# Patient Record
Sex: Female | Born: 1980
Health system: Southern US, Community
[De-identification: ages and names within clinical notes are randomized; demographics above are authoritative.]

## PROBLEM LIST (undated history)

## (undated) DIAGNOSIS — I4711 Inappropriate sinus tachycardia, so stated: Secondary | ICD-10-CM

## (undated) DIAGNOSIS — R519 Headache, unspecified: Secondary | ICD-10-CM

## (undated) DIAGNOSIS — M1990 Unspecified osteoarthritis, unspecified site: Secondary | ICD-10-CM

## (undated) DIAGNOSIS — E538 Deficiency of other specified B group vitamins: Secondary | ICD-10-CM

## (undated) DIAGNOSIS — F32A Depression, unspecified: Secondary | ICD-10-CM

## (undated) DIAGNOSIS — E079 Disorder of thyroid, unspecified: Secondary | ICD-10-CM

## (undated) DIAGNOSIS — M419 Scoliosis, unspecified: Secondary | ICD-10-CM

## (undated) DIAGNOSIS — K911 Postgastric surgery syndromes: Secondary | ICD-10-CM

## (undated) DIAGNOSIS — R51 Headache: Secondary | ICD-10-CM

## (undated) DIAGNOSIS — N939 Abnormal uterine and vaginal bleeding, unspecified: Secondary | ICD-10-CM

## (undated) DIAGNOSIS — M797 Fibromyalgia: Secondary | ICD-10-CM

## (undated) DIAGNOSIS — R8789 Other abnormal findings in specimens from female genital organs: Principal | ICD-10-CM

## (undated) DIAGNOSIS — R102 Pelvic and perineal pain: Secondary | ICD-10-CM

## (undated) DIAGNOSIS — G90A Postural orthostatic tachycardia syndrome (POTS): Secondary | ICD-10-CM

## (undated) DIAGNOSIS — F0781 Postconcussional syndrome: Secondary | ICD-10-CM

## (undated) DIAGNOSIS — F4310 Post-traumatic stress disorder, unspecified: Secondary | ICD-10-CM

## (undated) DIAGNOSIS — J930 Spontaneous tension pneumothorax: Secondary | ICD-10-CM

## (undated) DIAGNOSIS — N946 Dysmenorrhea, unspecified: Principal | ICD-10-CM

## (undated) DIAGNOSIS — F329 Major depressive disorder, single episode, unspecified: Secondary | ICD-10-CM

## (undated) DIAGNOSIS — W57XXXA Bitten or stung by nonvenomous insect and other nonvenomous arthropods, initial encounter: Secondary | ICD-10-CM

## (undated) DIAGNOSIS — I951 Orthostatic hypotension: Secondary | ICD-10-CM

## (undated) DIAGNOSIS — R000 Tachycardia, unspecified: Secondary | ICD-10-CM

## (undated) DIAGNOSIS — N926 Irregular menstruation, unspecified: Secondary | ICD-10-CM

## (undated) DIAGNOSIS — R5383 Other fatigue: Secondary | ICD-10-CM

## (undated) DIAGNOSIS — Z8669 Personal history of other diseases of the nervous system and sense organs: Secondary | ICD-10-CM

## (undated) DIAGNOSIS — F419 Anxiety disorder, unspecified: Secondary | ICD-10-CM

## (undated) DIAGNOSIS — I498 Other specified cardiac arrhythmias: Secondary | ICD-10-CM

## (undated) HISTORY — PX: ROTATOR CUFF REPAIR: SHX139

## (undated) HISTORY — DX: Post-traumatic stress disorder, unspecified: F43.10

## (undated) HISTORY — DX: Irregular menstruation, unspecified: N92.6

## (undated) HISTORY — DX: Scoliosis, unspecified: M41.9

## (undated) HISTORY — DX: Unspecified osteoarthritis, unspecified site: M19.90

## (undated) HISTORY — DX: Deficiency of other specified B group vitamins: E53.8

## (undated) HISTORY — DX: Postconcussional syndrome: F07.81

## (undated) HISTORY — DX: Orthostatic hypotension: I95.1

## (undated) HISTORY — PX: OTHER SURGICAL HISTORY: SHX169

## (undated) HISTORY — DX: Disorder of thyroid, unspecified: E07.9

## (undated) HISTORY — DX: Bitten or stung by nonvenomous insect and other nonvenomous arthropods, initial encounter: W57.XXXA

## (undated) HISTORY — DX: Postgastric surgery syndromes: K91.1

## (undated) HISTORY — PX: KNEE SURGERY: SHX244

## (undated) HISTORY — DX: Dysmenorrhea, unspecified: N94.6

## (undated) HISTORY — DX: Headache: R51

## (undated) HISTORY — DX: Inappropriate sinus tachycardia, so stated: I47.11

## (undated) HISTORY — DX: Postural orthostatic tachycardia syndrome (POTS): G90.A

## (undated) HISTORY — DX: Headache, unspecified: R51.9

## (undated) HISTORY — PX: COLONOSCOPY: SHX174

## (undated) HISTORY — DX: Fibromyalgia: M79.7

## (undated) HISTORY — DX: Pelvic and perineal pain: R10.2

## (undated) HISTORY — DX: Other specified cardiac arrhythmias: I49.8

## (undated) HISTORY — DX: Personal history of other diseases of the nervous system and sense organs: Z86.69

## (undated) HISTORY — DX: Abnormal uterine and vaginal bleeding, unspecified: N93.9

## (undated) HISTORY — DX: Other abnormal findings in specimens from female genital organs: R87.89

## (undated) HISTORY — DX: Other fatigue: R53.83

## (undated) HISTORY — DX: Tachycardia, unspecified: R00.0

---
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
## 2000-06-21 ENCOUNTER — Emergency Department (HOSPITAL_COMMUNITY): Admission: EM | Admit: 2000-06-21 | Discharge: 2000-06-21 | Payer: Self-pay | Admitting: Emergency Medicine

## 2001-05-09 ENCOUNTER — Other Ambulatory Visit: Admission: RE | Admit: 2001-05-09 | Discharge: 2001-05-09 | Payer: Self-pay | Admitting: Obstetrics and Gynecology

## 2001-05-17 ENCOUNTER — Emergency Department (HOSPITAL_COMMUNITY): Admission: EM | Admit: 2001-05-17 | Discharge: 2001-05-17 | Payer: Self-pay | Admitting: Emergency Medicine

## 2001-09-15 ENCOUNTER — Emergency Department (HOSPITAL_COMMUNITY): Admission: EM | Admit: 2001-09-15 | Discharge: 2001-09-16 | Payer: Self-pay | Admitting: Emergency Medicine

## 2001-09-16 ENCOUNTER — Encounter: Payer: Self-pay | Admitting: Emergency Medicine

## 2002-04-02 ENCOUNTER — Ambulatory Visit (HOSPITAL_COMMUNITY): Admission: RE | Admit: 2002-04-02 | Discharge: 2002-04-02 | Payer: Self-pay | Admitting: *Deleted

## 2002-04-02 ENCOUNTER — Encounter: Payer: Self-pay | Admitting: *Deleted

## 2002-07-03 ENCOUNTER — Ambulatory Visit (HOSPITAL_COMMUNITY): Admission: AD | Admit: 2002-07-03 | Discharge: 2002-07-03 | Payer: Self-pay | Admitting: *Deleted

## 2002-08-11 ENCOUNTER — Inpatient Hospital Stay (HOSPITAL_COMMUNITY): Admission: AD | Admit: 2002-08-11 | Discharge: 2002-08-13 | Payer: Self-pay | Admitting: *Deleted

## 2003-01-25 ENCOUNTER — Emergency Department (HOSPITAL_COMMUNITY): Admission: EM | Admit: 2003-01-25 | Discharge: 2003-01-26 | Payer: Self-pay | Admitting: *Deleted

## 2003-08-28 ENCOUNTER — Ambulatory Visit (HOSPITAL_COMMUNITY): Admission: RE | Admit: 2003-08-28 | Discharge: 2003-08-28 | Payer: Self-pay | Admitting: Family Medicine

## 2003-09-02 ENCOUNTER — Ambulatory Visit (HOSPITAL_COMMUNITY): Admission: RE | Admit: 2003-09-02 | Discharge: 2003-09-02 | Payer: Self-pay | Admitting: Internal Medicine

## 2003-11-30 ENCOUNTER — Ambulatory Visit (HOSPITAL_COMMUNITY): Admission: RE | Admit: 2003-11-30 | Discharge: 2003-11-30 | Payer: Self-pay | Admitting: *Deleted

## 2005-02-21 ENCOUNTER — Emergency Department (HOSPITAL_COMMUNITY): Admission: EM | Admit: 2005-02-21 | Discharge: 2005-02-22 | Payer: Self-pay | Admitting: Emergency Medicine

## 2005-03-29 ENCOUNTER — Ambulatory Visit (HOSPITAL_COMMUNITY): Admission: RE | Admit: 2005-03-29 | Discharge: 2005-03-29 | Payer: Self-pay | Admitting: General Surgery

## 2005-03-29 ENCOUNTER — Encounter (INDEPENDENT_AMBULATORY_CARE_PROVIDER_SITE_OTHER): Payer: Self-pay | Admitting: General Surgery

## 2005-11-19 ENCOUNTER — Observation Stay (HOSPITAL_COMMUNITY): Admission: AD | Admit: 2005-11-19 | Discharge: 2005-11-19 | Payer: Self-pay | Admitting: Obstetrics and Gynecology

## 2005-12-15 ENCOUNTER — Ambulatory Visit (HOSPITAL_COMMUNITY): Admission: AD | Admit: 2005-12-15 | Discharge: 2005-12-15 | Payer: Self-pay | Admitting: Obstetrics and Gynecology

## 2005-12-19 ENCOUNTER — Ambulatory Visit (HOSPITAL_COMMUNITY): Admission: AD | Admit: 2005-12-19 | Discharge: 2005-12-19 | Payer: Self-pay | Admitting: Obstetrics and Gynecology

## 2005-12-22 ENCOUNTER — Ambulatory Visit (HOSPITAL_COMMUNITY): Admission: AD | Admit: 2005-12-22 | Discharge: 2005-12-22 | Payer: Self-pay | Admitting: Obstetrics and Gynecology

## 2005-12-26 ENCOUNTER — Ambulatory Visit (HOSPITAL_COMMUNITY): Admission: AD | Admit: 2005-12-26 | Discharge: 2005-12-26 | Payer: Self-pay | Admitting: Obstetrics and Gynecology

## 2005-12-29 ENCOUNTER — Ambulatory Visit (HOSPITAL_COMMUNITY): Admission: AD | Admit: 2005-12-29 | Discharge: 2005-12-29 | Payer: Self-pay | Admitting: Internal Medicine

## 2006-01-03 ENCOUNTER — Encounter (INDEPENDENT_AMBULATORY_CARE_PROVIDER_SITE_OTHER): Payer: Self-pay | Admitting: *Deleted

## 2006-01-03 ENCOUNTER — Inpatient Hospital Stay (HOSPITAL_COMMUNITY): Admission: RE | Admit: 2006-01-03 | Discharge: 2006-01-06 | Payer: Self-pay | Admitting: Obstetrics & Gynecology

## 2006-03-07 ENCOUNTER — Ambulatory Visit (HOSPITAL_COMMUNITY): Payer: Self-pay | Admitting: Psychiatry

## 2006-03-14 ENCOUNTER — Ambulatory Visit (HOSPITAL_COMMUNITY): Payer: Self-pay | Admitting: Psychiatry

## 2006-03-29 ENCOUNTER — Ambulatory Visit (HOSPITAL_COMMUNITY): Payer: Self-pay | Admitting: Psychiatry

## 2006-03-30 ENCOUNTER — Ambulatory Visit (HOSPITAL_COMMUNITY): Payer: Self-pay | Admitting: Psychiatry

## 2006-04-10 ENCOUNTER — Ambulatory Visit (HOSPITAL_COMMUNITY): Payer: Self-pay | Admitting: Psychiatry

## 2006-04-17 ENCOUNTER — Ambulatory Visit (HOSPITAL_COMMUNITY): Payer: Self-pay | Admitting: Psychiatry

## 2006-04-19 ENCOUNTER — Ambulatory Visit (HOSPITAL_COMMUNITY): Payer: Self-pay | Admitting: Psychiatry

## 2006-04-24 ENCOUNTER — Ambulatory Visit (HOSPITAL_COMMUNITY): Payer: Self-pay | Admitting: Psychiatry

## 2006-04-26 ENCOUNTER — Ambulatory Visit (HOSPITAL_COMMUNITY): Payer: Self-pay | Admitting: Psychiatry

## 2006-05-04 ENCOUNTER — Ambulatory Visit (HOSPITAL_COMMUNITY): Payer: Self-pay | Admitting: Psychiatry

## 2006-05-10 ENCOUNTER — Ambulatory Visit (HOSPITAL_COMMUNITY): Payer: Self-pay | Admitting: Psychiatry

## 2006-05-29 ENCOUNTER — Ambulatory Visit (HOSPITAL_COMMUNITY): Payer: Self-pay | Admitting: Psychiatry

## 2006-07-10 ENCOUNTER — Ambulatory Visit (HOSPITAL_COMMUNITY): Payer: Self-pay | Admitting: Psychiatry

## 2006-07-11 ENCOUNTER — Ambulatory Visit (HOSPITAL_COMMUNITY): Payer: Self-pay | Admitting: Psychiatry

## 2006-07-19 ENCOUNTER — Ambulatory Visit (HOSPITAL_COMMUNITY): Payer: Self-pay | Admitting: Psychiatry

## 2006-07-24 ENCOUNTER — Ambulatory Visit (HOSPITAL_COMMUNITY): Payer: Self-pay | Admitting: Psychiatry

## 2006-08-20 ENCOUNTER — Ambulatory Visit (HOSPITAL_COMMUNITY): Payer: Self-pay | Admitting: Psychiatry

## 2006-08-21 ENCOUNTER — Ambulatory Visit (HOSPITAL_COMMUNITY): Payer: Self-pay | Admitting: Psychiatry

## 2006-09-17 ENCOUNTER — Ambulatory Visit (HOSPITAL_COMMUNITY): Payer: Self-pay | Admitting: Psychiatry

## 2006-10-11 ENCOUNTER — Ambulatory Visit (HOSPITAL_COMMUNITY): Payer: Self-pay | Admitting: Psychiatry

## 2006-12-11 ENCOUNTER — Ambulatory Visit (HOSPITAL_COMMUNITY): Payer: Self-pay | Admitting: Psychiatry

## 2008-03-02 ENCOUNTER — Ambulatory Visit (HOSPITAL_COMMUNITY): Admission: RE | Admit: 2008-03-02 | Discharge: 2008-03-02 | Payer: Self-pay | Admitting: Family Medicine

## 2008-04-10 ENCOUNTER — Other Ambulatory Visit: Admission: RE | Admit: 2008-04-10 | Discharge: 2008-04-10 | Payer: Self-pay | Admitting: Obstetrics & Gynecology

## 2009-03-03 ENCOUNTER — Ambulatory Visit (HOSPITAL_COMMUNITY): Admission: RE | Admit: 2009-03-03 | Discharge: 2009-03-03 | Payer: Self-pay | Admitting: Family Medicine

## 2009-05-26 ENCOUNTER — Other Ambulatory Visit: Admission: RE | Admit: 2009-05-26 | Discharge: 2009-05-26 | Payer: Self-pay | Admitting: Obstetrics and Gynecology

## 2009-06-22 ENCOUNTER — Ambulatory Visit (HOSPITAL_COMMUNITY): Admission: RE | Admit: 2009-06-22 | Discharge: 2009-06-22 | Payer: Self-pay | Admitting: Family Medicine

## 2009-11-26 ENCOUNTER — Ambulatory Visit (HOSPITAL_COMMUNITY): Admission: RE | Admit: 2009-11-26 | Discharge: 2009-11-26 | Payer: Self-pay | Admitting: Family Medicine

## 2009-12-22 ENCOUNTER — Ambulatory Visit (HOSPITAL_COMMUNITY): Admission: RE | Admit: 2009-12-22 | Discharge: 2009-12-22 | Payer: Self-pay | Admitting: Family Medicine

## 2010-04-07 ENCOUNTER — Other Ambulatory Visit: Admission: RE | Admit: 2010-04-07 | Discharge: 2010-04-07 | Payer: Self-pay | Admitting: Obstetrics and Gynecology

## 2010-04-11 ENCOUNTER — Ambulatory Visit (HOSPITAL_COMMUNITY): Admission: RE | Admit: 2010-04-11 | Discharge: 2010-04-11 | Payer: Self-pay | Admitting: Internal Medicine

## 2010-04-13 ENCOUNTER — Ambulatory Visit (HOSPITAL_COMMUNITY): Admission: RE | Admit: 2010-04-13 | Discharge: 2010-04-13 | Payer: Self-pay | Admitting: Obstetrics & Gynecology

## 2010-05-17 ENCOUNTER — Emergency Department (HOSPITAL_COMMUNITY): Admission: EM | Admit: 2010-05-17 | Discharge: 2010-05-17 | Payer: Self-pay | Admitting: Emergency Medicine

## 2010-05-17 ENCOUNTER — Emergency Department (HOSPITAL_COMMUNITY)
Admission: EM | Admit: 2010-05-17 | Discharge: 2010-05-17 | Payer: Self-pay | Source: Home / Self Care | Admitting: Emergency Medicine

## 2010-08-01 ENCOUNTER — Inpatient Hospital Stay (HOSPITAL_COMMUNITY): Admission: EM | Admit: 2010-08-01 | Discharge: 2010-08-05 | Payer: Self-pay | Admitting: Emergency Medicine

## 2010-12-14 LAB — BASIC METABOLIC PANEL
Chloride: 99 mEq/L (ref 96–112)
GFR calc Af Amer: >60 mL/min (ref >60)
Potassium: 4.2 mEq/L (ref 3.5–5.1)
Sodium: 135 mEq/L (ref 135–145)

## 2010-12-14 LAB — URINALYSIS, ROUTINE W REFLEX MICROSCOPIC
Bilirubin Urine: NEGATIVE
Glucose, UA: NEGATIVE mg/dL
Ketones, ur: NEGATIVE mg/dL
Leukocytes, UA: NEGATIVE
Protein, ur: NEGATIVE mg/dL

## 2010-12-14 LAB — CBC
HCT: 40 % (ref 36.0–46.0)
Hemoglobin: 13.9 g/dL (ref 12.0–15.0)
MCV: 94.1 fL (ref 78.0–100.0)
RBC: 4.25 MIL/uL (ref 3.87–5.11)
WBC: 10.5 10*3/uL (ref 4.0–10.5)

## 2010-12-14 LAB — HEPATIC FUNCTION PANEL
AST: 23 U/L (ref 0–37)
Albumin: 4.4 g/dL (ref 3.5–5.2)
Total Protein: 7 g/dL (ref 6.0–8.3)

## 2010-12-14 LAB — DIFFERENTIAL
Eosinophils Relative: 1 % (ref 0–5)
Lymphocytes Relative: 29 % (ref 12–46)
Lymphs Abs: 3 10*3/uL (ref 0.7–4.0)
Monocytes Absolute: 0.7 10*3/uL (ref 0.1–1.0)
Neutro Abs: 6.6 10*3/uL (ref 1.7–7.7)

## 2010-12-14 LAB — POCT CARDIAC MARKERS
CKMB, poc: <1 ng/mL — ABNORMAL LOW (ref 1.0–8.0)
Myoglobin, poc: 43.5 ng/mL (ref 12–200)
Troponin i, poc: <0.05 ng/mL (ref 0.00–0.09)

## 2010-12-14 LAB — POCT PREGNANCY, URINE: Preg Test, Ur: NEGATIVE

## 2010-12-14 LAB — URINE MICROSCOPIC-ADD ON

## 2011-02-17 NOTE — H&P (Signed)
NAMEKEERTHI, HAZELL                             ACCOUNT NO.:  [id number]   MEDICAL RECORD NO.:  [id number]                   PATIENT TYPE:   LOCATION:                                       FACILITY:  [facility]   PHYSICIAN:  Langley Gauss, M.D.                DATE OF BIRTH:  Jan 20, 1981   DATE OF ADMISSION:  08/11/2002  DATE OF DISCHARGE:                                HISTORY & PHYSICAL   HISTORY OF PRESENT ILLNESS:  A 30 year old gravida 2 para 0 at 38.[redacted] weeks  gestation is admitted for induction of labor.  The patient was noted to have  onset of gestational hypertension with blood pressure during the previous  several weeks as follows:  140/88, 148/88, 140/86, 170/100 on July 03, 2002.  The patient has been home monitoring home blood pressures.  She did  have a home blood pressure over the weekend of 156/97.  She denies any prior  history of hypertension.  She has never been treated with any hypertensive  medications.  She is noted to not have any proteinuria on examination.  She  denies any headaches or right upper quadrant pain.   ALLERGIES:  No known drug allergies.   PAST MEDICAL HISTORY:  The patient is noted to have a history of recurrent  oral HSV, was treated during the pregnancy at one point in time with Valtrex  2 g p.o. x1.  She denies any history of genital herpes.  She has no other  medical/surgical history.   CURRENT MEDICATIONS:  Prenatal vitamins.   SOCIAL HISTORY:  The patient is noted to smoke one pack per day, employed at  [employer].  Father of the baby is named Joey who is employed at [employer]  [employer]ation.  Maternal birth weight noted to be 8.5 pounds.  Paternal birth  weight 6.5 pounds.   PHYSICAL EXAMINATION:  GENERAL:  Healthy white female.  No significant edema  noted.  VITAL SIGNS:  Blood pressure 140/88.  Height 5 feet 5 inches.  Prepregnancy  weight 117, today's weight 150.  Pulse rate 80, respiratory rate 20.  HEENT:  Negative, no adenopathy.  NECK:   Supple.  Thyroid is nonpalpable.  LUNGS:  Clear.  CARDIOVASCULAR:  Regular rate and rhythm.  ABDOMEN:  Soft and nontender.  No surgical scars are identified.  She has  vertex presentation by Leopold's maneuvers.  EXTREMITIES:  Reveal only a trace pretibial edema.  Reflexes are 2+,  symmetric; no clonus identified.  FETAL HEART TONES:  Ausculted in the 150s.  PELVIC:  Normal external genitalia, no lesions or ulcerations, no vaginal  bleeding, no leakage of fluid.  Pelvis is noted to be clinically adequate on  examination.  Cervix is noted to be 2 cm dilated, 70% effaced, vertex at a 0  station and well applied to the cervix.    ASSESSMENT AND PLAN:  Findings as  above of gestational hypertension at term,  favorable cervix at this time.  The patient is referred to [facility] for induction.  Will proceed with amniotomy; thereafter induce or  augment with Pitocin if clinically indicated.  Pediatrician to be the  pediatrician on call.  The patient does plan on bottle feeding.  She will be  utilizing birth control pills for postpartum birth control purposes.                                               Langley Gauss, M.D.    DC/MEDQ  D:  08/11/2002  T:  08/11/2002  Job:  [id number]

## 2011-02-17 NOTE — Op Note (Signed)
[name], Destiny Orr                             ACCOUNT NO.:  [id number]   MEDICAL RECORD NO.:  [id number]                   PATIENT TYPE:  INP   LOCATION:  A419                                 FACILITY:  [facility]   PHYSICIAN:  Langley Gauss, M.D.                DATE OF BIRTH:  [birth year]   DATE OF PROCEDURE:  08/12/2002  DATE OF DISCHARGE:  08/13/2002                                 OPERATIVE REPORT   DIAGNOSES:  1. A 38+ week intrauterine pregnancy.  2. Gestational hypertension.   PROCEDURE:  Outlet vacuum extraction utilizing a Kiwi vacuum extractor.  Delivery performed by Langley Gauss, M.D.   FINDINGS:  Delivery of a 6 pound 9.3 ounce female infant delivered over a  midline episiotomy.  Findings at time of delivery include a nuchal cord x2  with compression.   ESTIMATED BLOOD LOSS:  Less than 500 cc.   COMPLICATIONS:  None.   SPECIMENS:  Arterial cord gas and cord blood to pathology.  The placenta was  examined and noted to be apparently intact with a three-vessel umbilical  cord.   ANALGESIA:  Epidural, supplemented with 30 cc of 1% lidocaine injected in  the midline in the perineal body.   DESCRIPTION OF PROCEDURE:  On 08/11/02, the patient admitted for induction  of labor secondary to findings of gestational hypertension.  Amniotomy  performed with findings of clear amniotic fluid.  The patient had an  epidural placed with onset of discomfort of uterine contractions.  The  patient thereafter progressed normally along the labor curve to complete  dilatation.  She had some moderate variable decelerations during the second  stage of labor.  She was placed in the dorsal lithotomy position and  encouraged to push.  The Foley catheter was removed.  The patient pushed  well with descent of the vertex to the perineal floor, at which time Kiwi  vacuum extractor was placed on the infant's vertex.  At all times pressure  was kept within the safe green range.  Over the next  consecutive two  contractions, the infant delivered in a direct OA position over this midline  episiotomy without extension, and mouth and nares were bulb-suctioned of  clear amniotic fluid.  Renewed expulsive efforts resulted in spontaneous  rotation to a left anterior shoulder position.  Gentle downward traction  combined with expulsive efforts resulted in delivery of the anterior  shoulder onto the pubic symphysis without difficulty as well as the  remainder of the infant.  The cord was doubly clamped and cut, and the  infant was placed on the maternal abdomen for immediate bonding purposes.  Arterial cord gas and cord blood were then obtained.  Gentle traction on the  umbilical cord resulted in separation, which upon examination was an intact  three-vessel placenta.  Examination of the genital tract reveals a midline  episiotomy without extension.  This is  easily repaired utilizing 0 chromic  in a running locked fashion on the  vaginal mucosa, followed by two-layer closure of 0 chromic on the perineal  body.  The patient tolerated the delivery very well.  She was taken out of  dorsal lithotomy position and rolled to her side, at which time the epidural  catheter is removed with the blue tip noted to be intact.                                                Langley Gauss, M.D.    DC/MEDQ  D:  08/18/2002  T:  08/18/2002  Job:  [id number]

## 2011-02-17 NOTE — Discharge Summary (Signed)
   NAMELEXYS, MILLINER                             ACCOUNT NO.:  [id number]   MEDICAL RECORD NO.:  [id number]                   PATIENT TYPE:  INP   LOCATION:  A419                                 FACILITY:  [facility]   PHYSICIAN:  Langley Gauss, M.D.                DATE OF BIRTH:  [birth year]   DATE OF ADMISSION:  08/11/2002  DATE OF DISCHARGE:  08/13/2002                                 DISCHARGE SUMMARY   PROCEDURE:  Delivered a 6-pound 9.3-ounce female infant.   FOLLOW-UP:  The patient will follow up in the office in four weeks' time.   DISCHARGE INSTRUCTIONS:  She is bottle-feeding.  She is utilizing  pediatrician on call.  She would like to utilize oral contraceptives for  postpartum birth control.   LABORATORY DATA:  Blood type Rh negative.  RhoGAM evaluation was done during  this hospitalization.  Admission hemoglobin and hematocrit 12.9/36.2, with a  white count of 15.4.  On postpartum day #1, 11.1/32.8 with a white count of  15.3.   HOSPITAL COURSE:  See previous dictations.  Postpartum the patient did well.  She bonded well with the infant.  The midline episiotomy appeared to be  healing very well.  She had adequate pain relief, such that she was  discharged home on postpartum day #1.   DISCHARGE MEDICATIONS:  Tylox #30.  In four weeks, the patient will  reinitiate oral contraceptives.  She has previously done well taking  jasmine.                                               Langley Gauss, M.D.    DC/MEDQ  D:  08/18/2002  T:  08/18/2002  Job:  [id number]

## 2011-02-17 NOTE — Op Note (Signed)
Destiny Orr, Destiny Orr                   ACCOUNT NO.:  [id number]   MEDICAL RECORD NO.:  [id number]          PATIENT TYPE:  INP   LOCATION:  A403                          FACILITY:  [facility]   PHYSICIAN:  Lazaro Arms, M.D.   DATE OF BIRTH:  [birth year]   DATE OF PROCEDURE:  01/03/2006  DATE OF DISCHARGE:                                 OPERATIVE REPORT   PREOPERATIVE DIAGNOSES:  1.  Intrauterine pregnancy at [redacted] weeks gestation.  2.  Gestational hypertension.  3.  Twins.   POSTOPERATIVE DIAGNOSES:  1.  Intrauterine pregnancy at [redacted] weeks gestation.  2.  Gestational hypertension.  3.  Twins.   PROCEDURE:  Primary low transverse cesarean section.   SURGEON:  Lazaro Arms, M.D.   ANESTHESIA:  Spinal.   FINDINGS:  Over a low transverse hysterotomy incision, was delivered a  viable twin, twin A.  Apgars 9 and 9, weight to be determined in nursery.  Cord blood and cord gas sent.  Twin B was then delivered or again vertex  presentation.  Cord blood and cord gas sent.  The infant had Apgars of 8 and  9 and was taken to the nursery to be weighed and evaluated.   DESCRIPTION OF PROCEDURE:  Patient was taken to the OR, placed in the  sitting position, underwent spinal anesthetic.  Was then placed in the  supine position.  Foley catheter was placed.  She was prepped and draped in  the usual sterile fashion.  Pfannenstiel skin incision was carried down  sharply through the rectus fascia, scored in the midline, extended  laterally.  The fascia was taken off the muscle superiorly and inferior, the  muscles were divided.  No cavity was entered.  Bladder blade was placed.  A  vesicouterine serosal flap was created.  A lot transverse hysterotomy  incision was made.  Twin A was delivered, handed to Dr. Milinda Cave who was in  attendance for routine neonatal resuscitation, then twin B.  They were both  in a vertex presentation.  Apgars were 9 and 9 on A and 8 and 9 on B.  Weight to be determined in the  nursery.  Three vessel cord.  Cord blood and  cord gases were sent on both twins.  The placentas were delivered and sent  for routine pathology.  They were fused.  The uterus was wiped clean,  exteriorized, closed in two layers, the first being running interlocking  layer, the second being imbricating layer.  The uterus was placed in  peritoneal cavity.  The uterine pedicle was hemostatic.  The pelvis was  irrigated vigorously.  The muscles and peritoneum reapproximated loosely.  The fascia closed using 0 Vicryl running subcutaneous  tissues, made hemostatic and irrigated. The skin was closed using skin  staples. The patient tolerated the procedure well.  She experienced 500 mL  of blood loss and was taken to the recovery room in good and stable  condition.  All counts correct x3.  She received Ancef prophylactically  after the cord was clamped.      Destiny Orr  [name], M.D.  Electronically Signed     LHE/MEDQ  D:  01/03/2006  T:  01/04/2006  Job:  [id number]

## 2011-02-17 NOTE — Op Note (Signed)
NAMENaidelin, Destiny Orr                   ACCOUNT NO.:  [id number]   MEDICAL RECORD NO.:  [id number]          PATIENT TYPE:  AMB   LOCATION:  DAY                           FACILITY:  [facility]   PHYSICIAN:  Dalia Heading, M.D.  DATE OF BIRTH:  1980/10/07   DATE OF PROCEDURE:  03/29/2005  DATE OF DISCHARGE:                                 OPERATIVE REPORT   PREOPERATIVE DIAGNOSIS:  Thrombosed hemorrhoid.   POSTOPERATIVE DIAGNOSIS:  Thrombosed hemorrhoid.   PROCEDURE:  Internal and external hemorrhoidectomy.   SURGEON:  Dr. Franky Macho.   ANESTHESIA:  General.   INDICATIONS:  The patient is a 30 year old white female who presents with a  thrombosed hemorrhoid. Risks and benefits of the procedure including  bleeding, infection, recurrence of the hemorrhoidal disease were fully  explained to the patient, who gave informed consent.   PROCEDURE NOTE:  The patient was placed in lithotomy position after general  anesthesia was administered. The perineum was prepped and draped in the  usual sterile technique with Betadine. Surgical site confirmation was  performed.   The patient had a large internal and external thrombosed hemorrhoid at the 7  o'clock position. No other significant hemorrhoidal disease was noted. A 2-0  Vicryl suture was placed at the dentate line. The hemorrhoid was excised  without difficulty. The mucosal edges were reapproximated using the running  2-0 Vicryl suture. 0.5% Sensorcaine was instilled in the surrounding  perineum. Surgicel and viscous Xylocaine was then placed in the rectum as a  packing.   All tape and needle counts correct at the end of the procedure. The patient  was awakened and transferred to PACU in stable condition.   COMPLICATIONS:  None.   SPECIMEN:  Hemorrhoid.   BLOOD LOSS:  Minimal.       MAJ/MEDQ  D:  03/29/2005  T:  03/29/2005  Job:  [id number]   cc:   Patrica Duel, M.D.  [address], [address]  [place]  [place] [address]  Fax:  [phone]

## 2011-02-17 NOTE — Group Therapy Note (Signed)
NAMETRUDI, MORGENTHALER                   ACCOUNT NO.:  [id number]   MEDICAL RECORD NO.:  [id number]          PATIENT TYPE:  OBV   LOCATION:  LDR3                          FACILITY:  [facility]   PHYSICIAN:  Tilda Burrow, M.D. DATE OF BIRTH:  27-Oct-1980   DATE OF PROCEDURE:  11/19/2005  DATE OF DISCHARGE:                                   PROGRESS NOTE   Observation to labor and delivery x1 hour. Reactive NST x2 infants.   CHIEF COMPLAINT:  Twin gestation with patient concerns of elevated blood  pressure.   HISTORY OF PRESENT ILLNESS:  This 30 year old female with known twin  gestation, gravida 3, para 1, AB 1, one living child at [redacted] weeks gestation  presents with concerns of blood pressure elevation. She had some pelvic  pressure and discomfort and was wondering if her blood pressure might be  elevated. She has no headaches, scotoma, right upper quadrant pain.  Pregnancy course to date has been notable for twin gestation with reportedly  symmetric growth. Last ultrasound of record was October 11, 2005 which was  reported as normal. She has been on [place] since September.   PHYSICAL EXAMINATION:  VITAL SIGNS:  Shows blood pressure initially 138/92  upon arrival; after resting, it was 138/82 after 15 minutes rest.  GENERAL:  She is without edema. Reactive NST is obtained on both infants.  Reflexes are 2+ without clonus. As started earlier, there is no right upper  quadrant pain on exam or patient's symptoms.   IMPRESSION:  No evidence of hypertension or evidence of preeclampsia.  Continue current regimen. The patient does not know dose. Will confirm and  document in office visit this week (the patient did not bring medications  with her). Follow up three days [facility] for recheck.      Tilda Burrow, M.D.  Electronically Signed     JVF/MEDQ  D:  11/19/2005  T:  11/20/2005  Job:  [id number]

## 2011-02-17 NOTE — Op Note (Signed)
   Destiny Orr, Destiny Orr                             ACCOUNT NO.:  [id number]   MEDICAL RECORD NO.:  [id number]                   PATIENT TYPE:  INP   LOCATION:  A419                                 FACILITY:  [facility]   PHYSICIAN:  Langley Gauss, M.D.                DATE OF BIRTH:  07/09/1981   DATE OF PROCEDURE:  08/11/2002  DATE OF DISCHARGE:  08/13/2002                                 OPERATIVE REPORT   PROCEDURE:  Placement of continuous lumbar epidural analgesia at the L3-4  interspace, performed by Langley Gauss, M.D.   COMPLICATIONS:  none.   DESCRIPTION OF PROCEDURE:  Appropriate informed consent was obtained.  The  patient was placed in a seated position, at which time bony landmarks were  identified.  The L3-4 interspace was chosen.  The patient's back is  sterilely prepped and draped in the usual sterile manner.  Five cubic  centimeters of 1% lidocaine plain injected at the midline at the L3-4  interspace, raising a small skin wheal.  A 17-gauge Tuohy-Schliff needle is  then utilized with loss of resistance to an air-filled glass syringe to  identify entry into the epidural space on the first attempt without  difficulty.  Initial test dose of 5 cc of 1.5% lidocaine plus epinephrine  injected through the epidural needle.  No signs of CSF or intravascular  injection obtained.  Thus, the epidural needle is removed after insertion of  the epidural catheter to a depth of 5 cm.  Aspiration test is negative.  A  second test dose of 2 cc of 1.5% lidocaine plus epinephrine injected through  the epidural catheter.  Again no signs of CSF or intravascular obtained, and  thus the patient is connected to an infusion pump containing the standard  mixture.  She will be treated with a bolus of 10 cc, followed by a  continuous infusion rate of 14 cc/heart rate.                                               Langley Gauss, M.D.    DC/MEDQ  D:  08/18/2002  T:  08/18/2002  Job:  [id number]

## 2011-02-17 NOTE — Op Note (Signed)
Destiny Orr, Destiny Orr                               ACCOUNT NO.:  [id number]   MEDICAL RECORD NO.:  [id number]                  PATIENT TYPE:   LOCATION:                                       FACILITY:   PHYSICIAN:  Langley Gauss, M.D.                DATE OF BIRTH:   DATE OF PROCEDURE:  DATE OF DISCHARGE:                                 OPERATIVE REPORT   OBSTETRICAL PROCEDURE NOTE:   PROCEDURE:  Placement of continuous lumbar epidural analgesia at the L3-L4  interspace performed by Dr. Roylene Reason. Lisette Grinder.   COMPLICATIONS:  None.   SPECIMENS:  None.   SUMMARY:  An appropriate and informed consent was obtained.  The patient  requested epidural analgesic with the onset of active labor.  She was placed  in the seated position at which time bony landmarks were identified.  The  patient is noted to have moderate scoliosis, but bony landmarks are easily  identified.  The L3-L4 interspace is selected.  The patient's back is  sterilely prepped and draped in the usual manner utilizing the epidural kit;  5 cc of 1% lidocaine plain is injected at the midline of the L3-L4  interspace to raise a small skin wheal.   The 17-gauge Touhy-Schliff needle was then utilized with loss of resistance  in air-filled glass syringe to identify entry into the epidural space on the  first attempt without difficulty.  Excellent loss of resistance was noted.  Five cc of 1.5% lidocaine plus epinephrine injected through the epidural  needle; and no signs of CSF or intravascular injection obtained.  We then  attempted to feed the epidural catheter; however, I was unable to feed the  catheter on the first attempt.  Thus, the glass syringe was reconnected.  Loss of resistance was again confirmed consistent with epidural placement.  The epidural needle was rotated 180 degrees.  On this attempt I was able to  easily pass the epidural catheter which was inserted to a depth of 5 cm.  The epidural needle was removed.   Aspiration test was negative.  Second test  dose of 2 cc of 1.5% lidocaine plus epinephrine injected through the  epidural catheter.  Again, no signs of CSF or intravascular injection  obtained.   Thus, the patient is connected to the infusion pump containing the standard  mixture.  Catheter is secured into place.  The patient is having tingling in  the feet consistent with a proper setting up epidural block.  She thus is  connected to the infusion pump with a continuous standard mixture.  She will  be treated with a bolus of 10 cc followed by an infusion rate of 14 cc/hour.  The patient does have evidence of an excellent setting up bilateral block.  After placement of the epidural she is examined.  Cervix is now noted to be  6 cm, completely effaced  with the vertex at a 0 station and a continued  reassuring fetal heart rate.  The patient is noted to still have slightly  elevated blood pressures with systolic blood pressures in the range of 140-  150.  Expectation is epidural with resultant normalization of blood  pressure.                                              Langley Gauss, M.D.   DC/MEDQ  D:  08/12/2002  T:  08/12/2002  Job:  [id number]

## 2011-02-17 NOTE — Op Note (Signed)
Destiny Orr, Destiny Orr                               ACCOUNT NO.:  [id number]   MEDICAL RECORD NO.:  [id number]                  PATIENT TYPE:   LOCATION:                                       FACILITY:   PHYSICIAN:  Langley Gauss, M.D.                DATE OF BIRTH:   DATE OF PROCEDURE:  08/12/2002  DATE OF DISCHARGE:                                 OPERATIVE REPORT   DELIVERY NOTE:   DIAGNOSES:  1. A 38+ week intrauterine pregnancy for induction of labor.  2. Gestational hypertension.   DELIVERY PERFORMED:  1. Outlet vacuum extraction 6 pound 9 ounce female infant.  2. Midline episiotomy repair.  Delivery performed by Dr. Roylene Reason. Lisette Grinder.   ANALGESIA:  Continuous lumbar epidural supplemented with 30 cc of 1%  lidocaine in the midline of the perineal body.   FINDINGS:  At the time of delivery is noted a nuchal cord x2 with  compression and moderate variable decelerations during the second stage of  labor.   ESTIMATED BLOOD LOSS:  Less than 500 cc.   SPECIMENS:  1. Arterial cord gas and cord blood to pathology.  2. The placenta is examined and noted to be apparently intake with a 3-     vessel umbilical cord.   SUMMARY:  The patient at 38+ weeks gestation seen in the office.  Again,  running slightly elevated blood pressure today 148/80.  Thus, she was  referred to [facility] for induction of labor.  Initial examination  cervix 2 cm dilated.  Amniotomy is performed with findings of clear amniotic  fluid.  Fetal scalp electrode is placed which documents a reassuring fetal  heart rate,   Pitocin induction thereafter initiated resulting in a functional labor  pattern.  With onset of discomfort with uterine contractions , the patient  initially requested IV pain medication.  She was treated with 10 mg of IV  Nubain.  However, this failed to achieve satisfactory results.  Thus the  patient requested epidural analgesia.  Epidural was placed without  difficulty.  It  functioned very well throughout the remainder of the labor  course.  Immediately after placement of the epidural the patient was noted  to be 6 cm dilated, completely effaced, and 0 station.  The patient had a  Foley catheter placed with findings of clear yellow urine.   Thereafter she progressed normally along the labor curve.  The patient  began, thereafter, having strong pelvic pressure. She was examined by the  nursing staff and noted to be completely dilated with the vertex at a +2  station.  The patient did not push until we were prepared for delivery.  The  patient was placed in the dorsal lithotomy position and prepped and draped  in the usual sterile manner.  Foley catheter was removed.   The patient pushed well during this second stage of  labor.  She did have  moderate variable decelerations which we found out later was due to the  nuchal cord.  There was descent of the vertex to the perineal floor;  however, the patient was not able to effect delivery beyond that; thus an  outlet Kiwi vacuum extractor was utilized.  Thirty cc of 1% lidocaine was  injected in the midline of the perineum.  A small midline episiotomy was  performed with the Kiwi applied and kept within the safe green range.  Gentle traction was applied which resulted in a very easy descent and  delivery of the vertex in a direct OA position over this midline episiotomy  without extension.   Mouth and nares of the infant were bulb suctioned of clear amniotic fluid.  The nuchal cord x2 was reduced.  Renewed expulsive efforts resulted in a  spontaneous rotation to a left anterior shoulder position.  Expulsive  efforts plus gentle downward traction resulted in delivery of this shoulder  as well as the remainder of the infant without difficulty.  The umbilical  cord was then milked towards the infant.  The cord was doubly clamped, and  cut.  A spontaneous and vigorous breathing cry is noted.  The infant is then   placed on the maternal abdomen for bonding purposes.  Arterial cord gases  and cord blood are obtained.   Gentle traction on the umbilical cord results in separation which, upon  examination, appears to be an intact 3-vessel cord with associated placenta.  Examination of the genital tract reveals no lacerations.  The midline  episiotomy has not extended.  This is easily repaired utilizing #0 chromic  in a running lock fashion on the vaginal mucosa followed by a layer of 2-  layer Vicryl in the deep tissues of the perineal body followed by a layer of  #0 chromic on the perineal skin.  This resulted in complete closure of the  episiotomy.  No other lacerations were noted to occur.  Uterine fundus noted  to be firm with manual massage.  Mother and infant doing very well following  delivery.  The mother is taken out of the dorsal lithotomy position and  rolled to her side at which time the epidural catheter was removed with the  blue tip noted to be intact.  The patient does plans on bottle feeding.  She  will be utilizing the pediatrician on call.  Infant weight:  6 pounds 9  ounce female infant.                                               Langley Gauss, M.D.    DC/MEDQ  D:  08/12/2002  T:  08/12/2002  Job:  [id number]

## 2011-02-17 NOTE — H&P (Signed)
Destiny Orr, Destiny Orr                   ACCOUNT NO.:  [id number]   MEDICAL RECORD NO.:  [id number]         PATIENT TYPE:  PAMB   LOCATION:                                FACILITY:  [facility]   PHYSICIAN:  Dalia Heading, M.D.  DATE OF BIRTH:  [birth year]   DATE OF ADMISSION:  03/29/2005  DATE OF DISCHARGE:  LH                                HISTORY & PHYSICAL   CHIEF COMPLAINT:  Thrombosed hemorrhoid.   HISTORY OF PRESENT ILLNESS:  The patient is a 30 year old white female who  is referred for evaluation and treatment of a thrombosed hemorrhoid. She has  had intermittent hemorrhoidal pain and swelling over many months. Her  current condition recurred three days ago.   Past medical history includes anxiety/depression.   PAST SURGICAL HISTORY:  Knee surgery.   CURRENT MEDICATIONS:  Birth control pills, Xanax, Lexapro.   ALLERGIES:  No known drug allergies.   REVIEW OF SYSTEMS:  The patient smokes a pack of cigarettes a day. She  denies any significant alcohol use. She denies any cardiopulmonary  difficulties or bleeding disorders.   PHYSICAL EXAMINATION:  GENERAL:  The patient is a well-developed, well-  nourished, white female in no acute distress. She is afebrile and vital  signs are stable.  LUNGS:  Clear to auscultation with equal breath sounds bilaterally.  HEART:  Reveals a regular rate and rhythm without S3, S4 or murmurs.  ABDOMEN:  Soft, nontender, nondistended. No hepatosplenomegaly or masses are  noted.  RECTAL:  Reveals a thrombosed hemorrhoid noted along the right lateral  aspect of the anus.   IMPRESSION:  Thrombosed hemorrhoid.   PLAN:  The patient is scheduled for hemorrhoidectomy on March 29, 2005. The  risks and benefits of the procedure including bleeding, infection, and  recurrence of the hemorrhoidal disease were fully explained to the patient  who gave informed consent.       MAJ/MEDQ  D:  03/28/2005  T:  03/28/2005  Job:  [id number]   cc:   Patrica Duel,  M.D.  [address], [address]  [place]  [place] [address]  Fax: [phone]   [facility]  Fax: [phone]

## 2011-02-17 NOTE — Discharge Summary (Signed)
Destiny Orr, Destiny Orr                   ACCOUNT NO.:  [id number]   MEDICAL RECORD NO.:  [id number]          PATIENT TYPE:  INP   LOCATION:  A403                          FACILITY:  [facility]   PHYSICIAN:  Tilda Burrow, M.D. DATE OF BIRTH:  [birth year]   DATE OF ADMISSION:  01/03/2006  DATE OF DISCHARGE:  04/07/2007LH                                 DISCHARGE SUMMARY   ADMISSION DIAGNOSIS:  Pregnancy, twins, [redacted] weeks gestation, declining trial  of labor.   DISCHARGE DIAGNOSIS:  Pregnancy at 37 weeks, twins delivered.   PROCEDURE:  Primary low-transverse cervical cesarean section, Dr. Duane Lope.   DISCHARGE MEDICATIONS:  1.  Tylox one p.o. q.4h. p.r.n. pain, dispense #30.  2.  Prenatal vitamins one p.o. daily.   FOLLOWUP:  In one week with Dr. Despina Hidden as scheduled.   HOSPITAL SUMMARY:  This 30 year old primiparous female at [redacted] weeks gestation  of pregnancy noted with twin gestation, notable for daily weight gain and  fundal height growth with ultrasound performed up to 34 weeks showing  symmetric growth.  Prenatal course was notable for multiple prenatal visits,  at least 24 visits documented, with blood type O negative, urine drug screen  negative, Rubella immune. Hemoglobin 13, hematocrit 14. Hepatitis, HIV, RPR,  GC, and chlamydia are all negative. Pap smear class I. The patient underwent  primary cesarean section by Dr. Despina Hidden on January 03, 2006.  The patient had been  notable for the past two weeks with blood pressures increasing from the 110  to 130 range systolic to 140 to 150 over 90 diastolic with negative protein,  reflux, etc. Cesarean was performed in an uncomplicated fashion delivering  healthy infants, Apgars of 9 and 9, weight of each infant was just below 5  pounds. Postoperative course was straightforward. The patient had an  admitting hemoglobin of 11.8, hematocrit 32.8, and had a postoperative  hemoglobin of 11.5, hematocrit 33.2. She was afebrile the entire postpartum  course and was discharged on postpartum day #3, tolerating a regular diet  with staples removed already due to spinning of the staples in the skin and  Steri-Strips placed. Follow up as scheduled with Dr. Despina Hidden.      Tilda Burrow, M.D.  Electronically Signed    JVF/MEDQ  D:  01/06/2006  T:  01/06/2006  Job:  [id number]   cc:   [facility]   [facility]

## 2011-05-11 ENCOUNTER — Other Ambulatory Visit: Payer: Self-pay | Admitting: Obstetrics & Gynecology

## 2011-05-11 ENCOUNTER — Other Ambulatory Visit (HOSPITAL_COMMUNITY)
Admission: RE | Admit: 2011-05-11 | Discharge: 2011-05-11 | Disposition: A | Discharge status: Home / Self Care | Payer: Self-pay | Source: Ambulatory Visit | Attending: Obstetrics & Gynecology | Admitting: Obstetrics & Gynecology

## 2011-05-11 DIAGNOSIS — Z01419 Encounter for gynecological examination (general) (routine) without abnormal findings: Secondary | ICD-10-CM | POA: Insufficient documentation

## 2011-08-13 ENCOUNTER — Emergency Department (HOSPITAL_COMMUNITY): Payer: Self-pay

## 2011-08-13 ENCOUNTER — Emergency Department (HOSPITAL_COMMUNITY)
Admission: EM | Admit: 2011-08-13 | Discharge: 2011-08-13 | Disposition: A | Discharge status: Home / Self Care | Payer: Self-pay | Attending: Emergency Medicine | Admitting: Emergency Medicine

## 2011-08-13 DIAGNOSIS — R079 Chest pain, unspecified: Secondary | ICD-10-CM | POA: Insufficient documentation

## 2011-08-13 DIAGNOSIS — S1093XA Contusion of unspecified part of neck, initial encounter: Secondary | ICD-10-CM | POA: Insufficient documentation

## 2011-08-13 DIAGNOSIS — S0003XA Contusion of scalp, initial encounter: Secondary | ICD-10-CM | POA: Insufficient documentation

## 2011-08-13 DIAGNOSIS — T7411XA Adult physical abuse, confirmed, initial encounter: Secondary | ICD-10-CM | POA: Insufficient documentation

## 2011-08-13 DIAGNOSIS — R51 Headache: Secondary | ICD-10-CM | POA: Insufficient documentation

## 2011-08-13 DIAGNOSIS — S20219A Contusion of unspecified front wall of thorax, initial encounter: Secondary | ICD-10-CM | POA: Insufficient documentation

## 2011-08-13 HISTORY — DX: Major depressive disorder, single episode, unspecified: F32.9

## 2011-08-13 HISTORY — DX: Depression, unspecified: F32.A

## 2011-08-13 HISTORY — DX: Anxiety disorder, unspecified: F41.9

## 2011-08-13 HISTORY — DX: Spontaneous tension pneumothorax: J93.0

## 2011-08-13 MED ORDER — HYDROCODONE-ACETAMINOPHEN 5-325 MG PO TABS
1.0000 | ORAL_TABLET | Freq: Once | ORAL | Status: AC
  Administered 2011-08-13: 1 via ORAL
  Filled 2011-08-13: qty 1

## 2011-08-13 MED ORDER — HYDROCODONE-ACETAMINOPHEN 5-325 MG PO TABS: ORAL_TABLET | ORAL | Status: DC

## 2011-08-13 NOTE — ED Notes (Signed)
Pt presents with left sided rib pain. Pain is on the back aspect of ribs. Pt states she slipped off of a chair while she was cleaning a ceiling fan.

## 2011-08-13 NOTE — ED Notes (Signed)
Law Engineer, manufacturing systems in room with pt.

## 2011-08-13 NOTE — ED Provider Notes (Signed)
Medical screening examination/treatment/procedure(s) were performed by non-physician practitioner and as supervising physician I was immediately available for consultation/collaboration.   Joya Gaskins, MD 08/13/11 [phone]
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
## 2012-01-15 ENCOUNTER — Encounter (HOSPITAL_COMMUNITY): Payer: Self-pay | Admitting: *Deleted

## 2012-01-15 ENCOUNTER — Emergency Department (HOSPITAL_COMMUNITY)
Admission: EM | Admit: 2012-01-15 | Discharge: 2012-01-15 | Disposition: A | Discharge status: Home / Self Care | Payer: Medicaid Other | Attending: Emergency Medicine | Admitting: Emergency Medicine

## 2012-01-15 DIAGNOSIS — L97509 Non-pressure chronic ulcer of other part of unspecified foot with unspecified severity: Secondary | ICD-10-CM | POA: Insufficient documentation

## 2012-01-15 DIAGNOSIS — M79673 Pain in unspecified foot: Secondary | ICD-10-CM

## 2012-01-15 DIAGNOSIS — F17200 Nicotine dependence, unspecified, uncomplicated: Secondary | ICD-10-CM | POA: Insufficient documentation

## 2012-01-15 DIAGNOSIS — M79609 Pain in unspecified limb: Secondary | ICD-10-CM | POA: Insufficient documentation

## 2012-01-15 DIAGNOSIS — M7989 Other specified soft tissue disorders: Secondary | ICD-10-CM | POA: Insufficient documentation

## 2012-01-15 DIAGNOSIS — M79643 Pain in unspecified hand: Secondary | ICD-10-CM

## 2012-01-15 MED ORDER — PREDNISONE 50 MG PO TABS: 50.0000 mg | ORAL_TABLET | Freq: Every day | ORAL | Status: AC

## 2012-01-15 NOTE — ED Notes (Signed)
Pt c/o swelling to bilateral hands and feet, states that it started over the weekend, pt also has pain to hand that extend to the knuckles area and pain in bilateral feet that extends up to just below the knee area, pt has red abrasions to the inside of the right ankle from wearing her work boots, pt states that the area has become "worse due to her Picking at it",

## 2012-01-15 NOTE — ED Notes (Signed)
Pain and swelling of hands and feet. No injury .  Has  Abrasions to rt foot.

## 2012-01-15 NOTE — ED Provider Notes (Signed)
History   This chart was scribed for Donnetta Hutching, MD by Brooks Sailors. The patient was seen in room APA12/APA12. Patient's care was started at 1138.   CSN: [id number]  Arrival date & time 01/15/12  1138   First MD Initiated Contact with Patient 01/15/12 1236      Chief Complaint  Patient presents with  . Leg Swelling    (Consider location/radiation/quality/duration/timing/severity/associated sxs/prior treatment) HPI  Destiny Orr is a 31 y.o. female who presents to the Emergency Department complaining of constant moderate swelling of the hands and feet onset two days ago. 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  Patient adds is on feet daily in work boots and usually scratches sores around ankles. Patient notes she takes a NSAID that helps swelling, and that the swelling is currently resolving.     Past Medical History  Diagnosis Date  . Anxiety   . Depression   . Pneumothorax, spontaneous, tension     Past Surgical History  Procedure Date  . Knee surgery     Family History  Problem Relation Age of Onset  . Hypertension Father     History  Substance Use Topics  . Smoking status: Current Everyday Smoker -- 1.0 packs/day  . Smokeless tobacco: Not on file  . Alcohol Use: No    OB History    Grav Para Term Preterm Abortions TAB SAB Ect Mult Living                  Review of Systems  All other systems reviewed and are negative.    Allergies  Review of patient's allergies indicates no known allergies.  Home Medications   Current Outpatient Rx  Name Route Sig Dispense Refill  . HYDROCODONE-ACETAMINOPHEN 5-325 MG PO TABS  1 tab po q 4-6 hrs prn pain 20 tablet 0    BP 157/92  Pulse 138  Temp(Src) 99.6 F (37.6 C) (Oral)  Resp 20  Ht 5\' 5"  (1.651 m)  Wt 137 lb (62.143 kg)  BMI 22.80 kg/m2  SpO2 100%  Physical Exam  Nursing note and vitals reviewed. Constitutional: She is oriented to person, place, and time. She  appears well-developed and well-nourished.  HENT:  Head: Normocephalic and atraumatic.  Eyes: Conjunctivae and EOM are normal. Pupils are equal, round, and reactive to light.  Neck: Normal range of motion. Neck supple.  Cardiovascular: Normal rate and regular rhythm.   Pulmonary/Chest: Effort normal and breath sounds normal.  Abdominal: Soft. Bowel sounds are normal.  Musculoskeletal: Normal range of motion.       Right foot, medial aspect 2x1cm ulceration and several satillite ulcerations.   Neurological: She is alert and oriented to person, place, and time.  Skin: Skin is warm and dry.  Psychiatric: She has a normal mood and affect.    ED Course  Procedures (including critical care time) DIAGNOSTIC STUDIES: Oxygen Saturation is 100% on room air, normal by my interpretation.    COORDINATION OF CARE: 1:08PM Going to give patient prednisone for hands and feet.    Labs Reviewed - No data to display No results found.   No diagnosis found.    MDM  Patient has joint pain in hands and feet.  No fever, chills, clinical evidence of cellulitis. Will start prednisone for one week.      I personally performed the services described in this documentation, which was scribed in my presence. The recorded information has been reviewed and considered.  Donnetta Hutching, MD 01/15/12 1401

## 2012-01-15 NOTE — Discharge Instructions (Signed)
Prednisone for one week.  Can continue other anti-inflammatory medication. Followup your rheumatologist if symptoms persist

## 2012-12-24 ENCOUNTER — Encounter: Payer: Self-pay | Admitting: Adult Health

## 2012-12-24 ENCOUNTER — Other Ambulatory Visit (HOSPITAL_COMMUNITY)
Admission: RE | Admit: 2012-12-24 | Discharge: 2012-12-24 | Disposition: A | Discharge status: Home / Self Care | Payer: Medicaid Other | Source: Ambulatory Visit | Attending: Obstetrics and Gynecology | Admitting: Obstetrics and Gynecology

## 2012-12-24 ENCOUNTER — Other Ambulatory Visit: Payer: Self-pay | Admitting: Adult Health

## 2012-12-24 ENCOUNTER — Ambulatory Visit (INDEPENDENT_AMBULATORY_CARE_PROVIDER_SITE_OTHER): Payer: Medicaid Other | Admitting: Adult Health

## 2012-12-24 ENCOUNTER — Telehealth: Payer: Self-pay | Admitting: Adult Health

## 2012-12-24 VITALS — BP 120/74 | HR 76 | Ht 65.0 in | Wt 161.0 lb

## 2012-12-24 DIAGNOSIS — IMO0001 Reserved for inherently not codable concepts without codable children: Secondary | ICD-10-CM | POA: Insufficient documentation

## 2012-12-24 DIAGNOSIS — Z3049 Encounter for surveillance of other contraceptives: Secondary | ICD-10-CM

## 2012-12-24 DIAGNOSIS — Z0000 Encounter for general adult medical examination without abnormal findings: Secondary | ICD-10-CM

## 2012-12-24 DIAGNOSIS — F988 Other specified behavioral and emotional disorders with onset usually occurring in childhood and adolescence: Secondary | ICD-10-CM | POA: Insufficient documentation

## 2012-12-24 DIAGNOSIS — Z01419 Encounter for gynecological examination (general) (routine) without abnormal findings: Secondary | ICD-10-CM | POA: Insufficient documentation

## 2012-12-24 DIAGNOSIS — F419 Anxiety disorder, unspecified: Secondary | ICD-10-CM | POA: Insufficient documentation

## 2012-12-24 DIAGNOSIS — R5383 Other fatigue: Secondary | ICD-10-CM

## 2012-12-24 DIAGNOSIS — Z1151 Encounter for screening for human papillomavirus (HPV): Secondary | ICD-10-CM | POA: Insufficient documentation

## 2012-12-24 DIAGNOSIS — Z8669 Personal history of other diseases of the nervous system and sense organs: Secondary | ICD-10-CM

## 2012-12-24 DIAGNOSIS — N949 Unspecified condition associated with female genital organs and menstrual cycle: Secondary | ICD-10-CM

## 2012-12-24 DIAGNOSIS — F4310 Post-traumatic stress disorder, unspecified: Secondary | ICD-10-CM | POA: Insufficient documentation

## 2012-12-24 DIAGNOSIS — R5381 Other malaise: Secondary | ICD-10-CM | POA: Insufficient documentation

## 2012-12-24 HISTORY — DX: Other fatigue: R53.83

## 2012-12-24 HISTORY — DX: Personal history of other diseases of the nervous system and sense organs: Z86.69

## 2012-12-24 MED ORDER — MEDROXYPROGESTERONE ACETATE 150 MG/ML IM SUSP: 150.0000 mg | INTRAMUSCULAR | Status: DC

## 2012-12-24 NOTE — Progress Notes (Signed)
Subjective:     Patient ID: NAZARENE BUNNING, female   DOB: 1980/11/14, 32 y.o.   MRN: [id number]  HPI Marylene is a 32 year old white female married in today for Pap and physical. She has family planning Medicaid. Shellsea has been getting her Depo-Provera at the health dept., and she recently had labs at the health department for fatigue and weight gain. She says her TSH was 1.3, and her hemoglobin was 13.8 and this was done last week. Safiyya has been off her psych medication since the end of January and has not seen Dr. Nolen Mu since October when she lost her insurance.   Review of Systems Patient denies any blurred vision, shortness of breath, chest pain, abdominal pain, problems with bowel movements,or urination. She does complain of breast pain and they are bigger.She also has fatigue, weight gain, and irregular periods on the Depo. She is having some headaches and sex hurts sometimes. She also has hair loss.She has a history of PTSD,  and anxiety. Reviewed past medical, surgical, social and family history . Reviewed medication and allergies.     Objective:   Physical ExamVital signs: Blood pressure 120/74, weight 161 lbs., height 65 inches, pulse 76. Skin: Warm and dry, increased number of tattoos. ENT: no loss hearing,normal nares  Neck: Midline trachea. Thyroid normal Lungs: Clear to auscultation bilaterally. Breasts: No dominant palpable mass, retraction, or nipple discharge. Cardiovascular: Regular rate and rhythm. Abdomen: Soft and non-tender, no hepatosplenomegaly. Pelvic: External genitalia is normal and appearance. The vagina is normal in appearance. Cervix is bulbous with negative cervical motion tenderness.Pap performed with HPV. Uterus felt to be normal size shape and contour. No adnexal masses  Noted.She is tender over uterus and LLQ.Extremities: No swelling or varicosities.Psych: Seems anxious and worried about self.    Assessment:     Yearly exam(Family Planning Medicaid) Contraceptive  counseling,STD testing Fatigue LLQ and uterine tenderness Nicotine Addiction History Anxiety and PTSD    Plan:     Return to clinic in 2 weeks for Depo   Call to see if she can get any financial assistance (needs pelvic ultrasound) Check HIV, RPR, hepatitis B, hepatitis C and HSV-2 antibody. Will see if can refer to [facility] health

## 2012-12-24 NOTE — Patient Instructions (Addendum)
sch depo for 4/14,call Lubertha Basque  , then call me back, physical in 1 year, Sign up for my chart

## 2012-12-24 NOTE — Telephone Encounter (Signed)
Pt called to let me know she spoke with betty ratliff about financial assistance. Will set up pelvic ultra sound after aide is approved, will also see if can get in to see Behavorial health.

## 2012-12-25 ENCOUNTER — Encounter: Payer: Self-pay | Admitting: Adult Health

## 2012-12-25 ENCOUNTER — Telehealth: Payer: Self-pay | Admitting: *Deleted

## 2012-12-25 ENCOUNTER — Telehealth: Payer: Self-pay | Admitting: Adult Health

## 2012-12-25 LAB — RPR

## 2012-12-25 LAB — HEPATITIS C ANTIBODY: HCV Ab: NEGATIVE

## 2012-12-25 LAB — HSV 2 ANTIBODY, IGG: HSV 2 Glycoprotein G Ab, IgG: <0.1 IV

## 2012-12-25 LAB — HEPATITIS B SURFACE ANTIGEN: Hepatitis B Surface Ag: NEGATIVE

## 2012-12-25 NOTE — Telephone Encounter (Signed)
Returned call  To pt. related to email message received today, per Cyril Mourning, NP pt to go to ER with sharp pains in chest. Will call pt with appt for ultrasound and blood work for here. Pt verbalized understanding.

## 2012-12-25 NOTE — Telephone Encounter (Signed)
Left message, will get ultra sound scheduled and call back tomorrow.

## 2012-12-26 ENCOUNTER — Other Ambulatory Visit: Payer: Self-pay | Admitting: Adult Health

## 2012-12-26 DIAGNOSIS — R10814 Left lower quadrant abdominal tenderness: Secondary | ICD-10-CM

## 2012-12-26 NOTE — Progress Notes (Signed)
Called Destiny Orr and told her of ultrasound appointment 01/02/13 at [facility] at 8am, be there at 7:45. NPO after midnight except water to fill bladder.

## 2013-01-02 ENCOUNTER — Ambulatory Visit (HOSPITAL_COMMUNITY)
Admission: RE | Admit: 2013-01-02 | Discharge: 2013-01-02 | Disposition: A | Discharge status: Home / Self Care | Payer: Self-pay | Source: Ambulatory Visit | Attending: Adult Health | Admitting: Adult Health

## 2013-01-02 ENCOUNTER — Telehealth: Payer: Self-pay | Admitting: Adult Health

## 2013-01-02 DIAGNOSIS — R10814 Left lower quadrant abdominal tenderness: Secondary | ICD-10-CM

## 2013-01-02 DIAGNOSIS — N926 Irregular menstruation, unspecified: Secondary | ICD-10-CM | POA: Insufficient documentation

## 2013-01-02 DIAGNOSIS — R1032 Left lower quadrant pain: Secondary | ICD-10-CM | POA: Insufficient documentation

## 2013-01-02 NOTE — Telephone Encounter (Signed)
Called Destiny Orr to inform her both her ultrasounds were normal,and to make appointment for labs.

## 2013-01-07 ENCOUNTER — Other Ambulatory Visit: Payer: Medicaid Other | Admitting: Obstetrics & Gynecology

## 2013-01-07 DIAGNOSIS — R5383 Other fatigue: Secondary | ICD-10-CM

## 2013-01-07 DIAGNOSIS — R109 Unspecified abdominal pain: Secondary | ICD-10-CM

## 2013-01-07 DIAGNOSIS — R140 Abdominal distension (gaseous): Secondary | ICD-10-CM

## 2013-01-07 LAB — COMPREHENSIVE METABOLIC PANEL
ALT: 10 U/L (ref 0–35)
Alkaline Phosphatase: 84 U/L (ref 39–117)
Creat: 0.83 mg/dL (ref 0.50–1.10)
Sodium: 139 mEq/L (ref 135–145)
Total Bilirubin: 0.3 mg/dL (ref 0.3–1.2)
Total Protein: 6.7 g/dL (ref 6.0–8.3)

## 2013-01-07 LAB — CBC
MCH: 30.1 pg (ref 26.0–34.0)
MCHC: 33.9 g/dL (ref 30.0–36.0)
Platelets: 287 10*3/uL (ref 150–400)

## 2013-01-07 LAB — LIPID PANEL
LDL Cholesterol: 90 mg/dL (ref 0–99)
Total CHOL/HDL Ratio: 3.2 Ratio
Triglycerides: 157 mg/dL — ABNORMAL HIGH (ref <150)
VLDL: 31 mg/dL (ref 0–40)

## 2013-01-07 LAB — THYROID PANEL WITH TSH: Free Thyroxine Index: 2.1 (ref 1.0–3.9)

## 2013-01-08 ENCOUNTER — Telehealth: Payer: Self-pay | Admitting: Obstetrics & Gynecology

## 2013-01-08 LAB — VITAMIN D 25 HYDROXY (VIT D DEFICIENCY, FRACTURES): Vit D, 25-Hydroxy: 25 ng/mL — ABNORMAL LOW (ref 30–89)

## 2013-01-08 NOTE — Progress Notes (Signed)
Labs sent to me in error.  Routed to [employer] and she received

## 2013-01-09 ENCOUNTER — Encounter: Payer: Self-pay | Admitting: Adult Health

## 2013-01-09 ENCOUNTER — Telehealth: Payer: Self-pay | Admitting: Obstetrics & Gynecology

## 2013-01-09 NOTE — Telephone Encounter (Signed)
Called pt. About her labs.CBC and CMP was normal. The TSH and thyroid panel was normal. Lipid profile was normal except triglycerides were 157. A1c 5.4. Vitamin B12 203 take OTC B12. Vitamin  D was 25 take 2000 iu per day. Call with any questions or problems.

## 2013-01-09 NOTE — Telephone Encounter (Signed)
Reviewed labs, some results abnormal, please advise.

## 2013-01-10 ENCOUNTER — Telehealth: Payer: Self-pay | Admitting: *Deleted

## 2013-01-10 NOTE — Telephone Encounter (Signed)
Left message,that she needed to call  Back to discuss

## 2013-01-13 ENCOUNTER — Ambulatory Visit (INDEPENDENT_AMBULATORY_CARE_PROVIDER_SITE_OTHER): Payer: Medicaid Other | Admitting: Obstetrics & Gynecology

## 2013-01-13 VITALS — BP 136/80 | Ht 64.0 in | Wt 161.0 lb

## 2013-01-13 DIAGNOSIS — Z309 Encounter for contraceptive management, unspecified: Secondary | ICD-10-CM

## 2013-01-13 DIAGNOSIS — Z3049 Encounter for surveillance of other contraceptives: Secondary | ICD-10-CM

## 2013-01-13 LAB — POCT URINE PREGNANCY: Preg Test, Ur: NEGATIVE

## 2013-01-13 MED ORDER — MEDROXYPROGESTERONE ACETATE 150 MG/ML IM SUSP
150.0000 mg | Freq: Once | INTRAMUSCULAR | Status: AC
  Administered 2013-01-13: 150 mg via INTRAMUSCULAR

## 2013-01-14 NOTE — Progress Notes (Signed)
Patient ID: Destiny Orr, female   DOB: [birth year], 32 y.o.   MRN: [id number] Patient in to receive Dep Provera 150 mg IM

## 2013-02-19 ENCOUNTER — Encounter: Payer: Self-pay | Admitting: Obstetrics & Gynecology

## 2013-02-19 ENCOUNTER — Encounter: Payer: Medicaid Other | Admitting: Obstetrics & Gynecology

## 2013-04-02 ENCOUNTER — Ambulatory Visit (INDEPENDENT_AMBULATORY_CARE_PROVIDER_SITE_OTHER): Payer: Self-pay | Admitting: Family Medicine

## 2013-04-02 ENCOUNTER — Encounter: Payer: Self-pay | Admitting: Family Medicine

## 2013-04-02 VITALS — BP 132/88 | HR 70 | Wt 156.0 lb

## 2013-04-02 DIAGNOSIS — G8929 Other chronic pain: Secondary | ICD-10-CM | POA: Insufficient documentation

## 2013-04-02 DIAGNOSIS — IMO0001 Reserved for inherently not codable concepts without codable children: Secondary | ICD-10-CM

## 2013-04-02 DIAGNOSIS — M549 Dorsalgia, unspecified: Secondary | ICD-10-CM

## 2013-04-02 LAB — RHEUMATOID FACTOR: Rhuematoid fact SerPl-aCnc: <10 IU/mL (ref <=14)

## 2013-04-02 LAB — SEDIMENTATION RATE: Sed Rate: 4 mm/hr (ref 0–22)
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
## 2013-04-03 ENCOUNTER — Telehealth: Payer: Self-pay | Admitting: Family Medicine

## 2013-04-03 ENCOUNTER — Encounter: Payer: Self-pay | Admitting: Family Medicine

## 2013-04-03 NOTE — Telephone Encounter (Signed)
Enc Date 04/03/13 - printed & mailed 04/07/13 ° °

## 2013-04-07 ENCOUNTER — Telehealth: Payer: Self-pay | Admitting: Family Medicine

## 2013-04-07 NOTE — Telephone Encounter (Signed)
Patient is calling to get BW results.

## 2013-04-07 NOTE — Telephone Encounter (Signed)
Blood work results under chart review -labs

## 2013-04-14 ENCOUNTER — Ambulatory Visit: Payer: Medicaid Other

## 2013-08-07 ENCOUNTER — Other Ambulatory Visit: Payer: Self-pay

## 2013-12-04 ENCOUNTER — Emergency Department (HOSPITAL_COMMUNITY): Payer: BC Managed Care – PPO

## 2013-12-04 ENCOUNTER — Encounter (HOSPITAL_COMMUNITY): Payer: Self-pay | Admitting: Emergency Medicine

## 2013-12-04 ENCOUNTER — Emergency Department (HOSPITAL_COMMUNITY)
Admission: EM | Admit: 2013-12-04 | Discharge: 2013-12-04 | Disposition: A | Discharge status: Home / Self Care | Payer: BC Managed Care – PPO | Attending: Emergency Medicine | Admitting: Emergency Medicine

## 2013-12-04 DIAGNOSIS — S60221A Contusion of right hand, initial encounter: Secondary | ICD-10-CM

## 2013-12-04 DIAGNOSIS — F17200 Nicotine dependence, unspecified, uncomplicated: Secondary | ICD-10-CM | POA: Insufficient documentation

## 2013-12-04 DIAGNOSIS — Y929 Unspecified place or not applicable: Secondary | ICD-10-CM | POA: Insufficient documentation

## 2013-12-04 DIAGNOSIS — Z8709 Personal history of other diseases of the respiratory system: Secondary | ICD-10-CM | POA: Insufficient documentation

## 2013-12-04 DIAGNOSIS — Y939 Activity, unspecified: Secondary | ICD-10-CM | POA: Insufficient documentation

## 2013-12-04 DIAGNOSIS — Z8679 Personal history of other diseases of the circulatory system: Secondary | ICD-10-CM | POA: Insufficient documentation

## 2013-12-04 DIAGNOSIS — M129 Arthropathy, unspecified: Secondary | ICD-10-CM | POA: Insufficient documentation

## 2013-12-04 DIAGNOSIS — W230XXA Caught, crushed, jammed, or pinched between moving objects, initial encounter: Secondary | ICD-10-CM | POA: Insufficient documentation

## 2013-12-04 DIAGNOSIS — Z8659 Personal history of other mental and behavioral disorders: Secondary | ICD-10-CM | POA: Insufficient documentation

## 2013-12-04 DIAGNOSIS — S60229A Contusion of unspecified hand, initial encounter: Secondary | ICD-10-CM | POA: Insufficient documentation

## 2013-12-04 NOTE — ED Provider Notes (Signed)
CSN: [id number]     Arrival date & time 12/04/13  0824 History  This chart was scribed for Destiny Christen, MD,  by Destiny Orr, ED Scribe. The patient was seen in room APA07/APA07 and the patient's care was started at 8:30 AM.   First MD Initiated Contact with Patient 12/04/13 0831     Chief Complaint  Patient presents with  . Hand Pain     (Consider location/radiation/quality/duration/timing/severity/associated sxs/prior Treatment) Patient is a 33 y.o. female presenting with hand pain. The history is provided by the patient and medical records. No language interpreter was used.  Hand Pain This is a new problem. The current episode started 6 to 12 hours ago. The problem occurs constantly. The problem has been gradually worsening. The symptoms are aggravated by bending, twisting and exertion. Nothing relieves the symptoms. She has tried nothing for the symptoms.   HPI Comments: Destiny Orr is a 33 y.o. female who presents to the Emergency Department complaining of right hand pain after jamming her hand between two refrigerators last night. The pain is worse at third and fourth phalanx and nothing seems to resolve it. The site is painful with all movement and is swollen. She denies any other injury. After the accident she did not do any strenuous work. Pt has a past medical hx of arthritis.    Past Medical History  Diagnosis Date  . Anxiety   . Depression   . Pneumothorax, spontaneous, tension   . PTSD (post-traumatic stress disorder)   . Arthritis   . DJD (degenerative joint disease)   . Scoliosis   . Fatigue 12/24/2012  . ADD (attention deficit disorder) 12/24/2012  . Hx of migraine headaches 12/24/2012   Past Surgical History  Procedure Laterality Date  . Knee surgery    . Cesarean section    . Bone spurs toes Right    Family History  Problem Relation Age of Onset  . Hypertension Father   . Atrial fibrillation Father   . Emphysema Mother   . Cancer Maternal Grandmother      skin   . Heart disease Maternal Grandmother   . Heart disease Paternal Grandfather   . COPD Paternal Grandfather   . Hypertension Paternal Grandfather   . Diabetes Paternal Grandfather    History  Substance Use Topics  . Smoking status: Current Every Day Smoker -- 1.50 packs/day for 15 years    Types: Cigarettes  . Smokeless tobacco: Never Used     Comment: "working on it" Was smoking 2.5 packs a day  . Alcohol Use: No     Comment: occ   OB History   Grav Para Term Preterm Abortions TAB SAB Ect Mult Living   3 2 2  1    1 3      Review of Systems  Musculoskeletal: Positive for arthralgias, joint swelling and myalgias.  All other systems reviewed and are negative.      Allergies  Review of patient's allergies indicates no known allergies.  Home Medications   Current Outpatient Rx  Name  Route  Sig  Dispense  Refill  . ibuprofen (ADVIL,MOTRIN) 200 MG tablet   Oral   Take 400 mg by mouth every 6 (six) hours as needed. Pain          BP 143/110  Pulse 93  Temp(Src) 98 F (36.7 C)  Resp 16  SpO2 100%  LMP 11/02/2013 Physical Exam  Nursing note and vitals reviewed. Constitutional: She is oriented to person, place,  and time. She appears well-developed and well-nourished. No distress.  HENT:  Head: Normocephalic and atraumatic.  Mouth/Throat: Oropharynx is clear and moist.  Eyes: Conjunctivae and EOM are normal.  Neck: Normal range of motion. Neck supple.  Cardiovascular: Normal rate, regular rhythm and normal heart sounds.   Pulmonary/Chest: Effort normal and breath sounds normal. No respiratory distress.  Musculoskeletal: She exhibits edema and tenderness.  Tenderness to the third and forth MCP Limited ROM   Neurological: She is alert and oriented to person, place, and time. No sensory deficit.  Skin: Skin is warm and dry. She is not diaphoretic.  Psychiatric: She has a normal mood and affect. Her behavior is normal.    ED Course  Procedures (including  critical care time) DIAGNOSTIC STUDIES: Oxygen Saturation is 100% on room air, normal by my interpretation.    COORDINATION OF CARE:  8:35 AM Discussed course of care with pt which includes right hand x-ray. Pt understands and agrees.   Labs Review Labs Reviewed - No data to display Imaging Review Dg Hand Complete Right  12/04/2013   CLINICAL DATA:  Crush-type injury now with pain and swelling  EXAM: RIGHT HAND - COMPLETE 3+ VIEW  COMPARISON:  None.  FINDINGS: Three views of the right hand reveal the bones to be adequately mineralized. There is no evidence of an acute fracture nor dislocation. The interphalangeal and metacarpophalangeal joints are normal in appearance. The carpometacarpal joints also appear normal. Mild soft tissue swelling over the dorsal aspect of the metacarpophalangeal joints is demonstrated.  IMPRESSION: There is no acute bony abnormality of the right hand.   Electronically Signed   By: David  Martinique   On: 12/04/2013 09:00     EKG Interpretation None      MDM  I personally performed the services described in this documentation, which was scribed in my presence. The recorded information has been reviewed and is accurate.   Final diagnoses:  Contusion of right hand    Plain films of right hand negative for fracture. No other injuries     Destiny Christen, MD 12/04/13 [phone]

## 2013-12-04 NOTE — ED Notes (Signed)
Pt states she jammed her hand between two refrigerators last night. Pain to knuckle on right hand in third and fourth finger.

## 2013-12-04 NOTE — ED Notes (Signed)
Patient transported to X-ray 

## 2013-12-04 NOTE — Discharge Instructions (Signed)
Hand Contusion A hand contusion is a deep bruise on your hand area. Contusions are the result of an injury that caused bleeding under the skin. The contusion may turn blue, purple, or yellow. Minor injuries will give you a painless contusion, but more severe contusions may stay painful and swollen for a few weeks. CAUSES  A contusion is usually caused by a blow, trauma, or direct force to an area of the body. SYMPTOMS   Swelling and redness of the injured area.  Discoloration of the injured area.  Tenderness and soreness of the injured area.  Pain. DIAGNOSIS  The diagnosis can be made by taking a history and performing a physical exam. An X-ray, CT scan, or MRI may be needed to determine if there were any associated injuries, such as broken bones (fractures). TREATMENT  Often, the best treatment for a hand contusion is resting, elevating, icing, and applying cold compresses to the injured area. Over-the-counter medicines may also be recommended for pain control. HOME CARE INSTRUCTIONS   Put ice on the injured area.  Put ice in a plastic bag.  Place a towel between your skin and the bag.  Leave the ice on for 15-20 minutes, 03-04 times a day.  Only take over-the-counter or prescription medicines as directed by your caregiver. Your caregiver may recommend avoiding anti-inflammatory medicines (aspirin, ibuprofen, and naproxen) for 48 hours because these medicines may increase bruising.  If told, use an elastic wrap as directed. This can help reduce swelling. You may remove the wrap for sleeping, showering, and bathing. If your fingers become numb, cold, or blue, take the wrap off and reapply it more loosely.  Elevate your hand with pillows to reduce swelling.  Avoid overusing your hand if it is painful. SEEK IMMEDIATE MEDICAL CARE IF:   You have increased redness, swelling, or pain in your hand.  Your swelling or pain is not relieved with medicines.  You have loss of feeling in  your hand or are unable to move your fingers.  Your hand turns cold or blue.  You have pain when you move your fingers.  Your hand becomes warm to the touch.  Your contusion does not improve in 2 days. MAKE SURE YOU:   Understand these instructions.  Will watch your condition.  Will get help right away if you are not doing well or get worse. Document Released: 03/10/2002 Document Revised: 06/12/2012 Document Reviewed: 03/11/2012 [facility] Patient Information 2014 [place].   X-ray showed no fracture. Tylenol and/or ibuprofen for pain. Ice pack

## 2014-02-09 ENCOUNTER — Emergency Department (HOSPITAL_COMMUNITY)
Admission: EM | Admit: 2014-02-09 | Discharge: 2014-02-09 | Disposition: A | Discharge status: Home / Self Care | Payer: BC Managed Care – PPO | Attending: Emergency Medicine | Admitting: Emergency Medicine

## 2014-02-09 ENCOUNTER — Emergency Department (HOSPITAL_COMMUNITY): Payer: BC Managed Care – PPO

## 2014-02-09 ENCOUNTER — Encounter (HOSPITAL_COMMUNITY): Payer: Self-pay | Admitting: Emergency Medicine

## 2014-02-09 DIAGNOSIS — S0003XA Contusion of scalp, initial encounter: Secondary | ICD-10-CM | POA: Insufficient documentation

## 2014-02-09 DIAGNOSIS — S0181XA Laceration without foreign body of other part of head, initial encounter: Secondary | ICD-10-CM

## 2014-02-09 DIAGNOSIS — F17200 Nicotine dependence, unspecified, uncomplicated: Secondary | ICD-10-CM | POA: Insufficient documentation

## 2014-02-09 DIAGNOSIS — Z8709 Personal history of other diseases of the respiratory system: Secondary | ICD-10-CM | POA: Insufficient documentation

## 2014-02-09 DIAGNOSIS — Z8739 Personal history of other diseases of the musculoskeletal system and connective tissue: Secondary | ICD-10-CM | POA: Insufficient documentation

## 2014-02-09 DIAGNOSIS — Z8659 Personal history of other mental and behavioral disorders: Secondary | ICD-10-CM | POA: Insufficient documentation

## 2014-02-09 DIAGNOSIS — R000 Tachycardia, unspecified: Secondary | ICD-10-CM | POA: Insufficient documentation

## 2014-02-09 DIAGNOSIS — S1093XA Contusion of unspecified part of neck, initial encounter: Secondary | ICD-10-CM

## 2014-02-09 DIAGNOSIS — Z8679 Personal history of other diseases of the circulatory system: Secondary | ICD-10-CM | POA: Insufficient documentation

## 2014-02-09 DIAGNOSIS — Z23 Encounter for immunization: Secondary | ICD-10-CM | POA: Insufficient documentation

## 2014-02-09 DIAGNOSIS — S0180XA Unspecified open wound of other part of head, initial encounter: Secondary | ICD-10-CM | POA: Insufficient documentation

## 2014-02-09 DIAGNOSIS — S0083XA Contusion of other part of head, initial encounter: Secondary | ICD-10-CM | POA: Insufficient documentation

## 2014-02-09 DIAGNOSIS — S01109A Unspecified open wound of unspecified eyelid and periocular area, initial encounter: Secondary | ICD-10-CM | POA: Insufficient documentation

## 2014-02-09 MED ORDER — HYDROCODONE-ACETAMINOPHEN 5-325 MG PO TABS
1.0000 | ORAL_TABLET | Freq: Once | ORAL | Status: AC
  Administered 2014-02-09: 1 via ORAL
  Filled 2014-02-09: qty 1

## 2014-02-09 MED ORDER — TETANUS-DIPHTH-ACELL PERTUSSIS 5-2.5-18.5 LF-MCG/0.5 IM SUSP
0.5000 mL | Freq: Once | INTRAMUSCULAR | Status: AC
  Administered 2014-02-09: 0.5 mL via INTRAMUSCULAR
  Filled 2014-02-09: qty 0.5

## 2014-02-09 MED ORDER — HYDROCODONE-ACETAMINOPHEN 5-325 MG PO TABS: 1.0000 | ORAL_TABLET | ORAL | Status: DC | PRN

## 2014-02-09 MED ORDER — DIAZEPAM 5 MG PO TABS
5.0000 mg | ORAL_TABLET | Freq: Once | ORAL | Status: AC
  Administered 2014-02-09: 5 mg via ORAL
  Filled 2014-02-09: qty 1

## 2014-02-09 MED ORDER — IBUPROFEN 800 MG PO TABS: 800.0000 mg | ORAL_TABLET | Freq: Three times a day (TID) | ORAL | Status: DC
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
## 2014-02-09 NOTE — Discharge Instructions (Signed)
Your lacerations were repaired with Dermabond. Please allow this to come off on its own, please do not pull it. Please see your primary physician, or return to the emergency department if any signs of infection. Please apply an ice pack to your left face. Please use ibuprofen 3 times daily with food. Please use Norco for pain if needed. This medication may cause drowsiness, please use with caution. Facial or Scalp Contusion A facial or scalp contusion is a deep bruise on the face or head. Injuries to the face and head generally cause a lot of swelling, especially around the eyes. Contusions are the result of an injury that caused bleeding under the skin. The contusion may turn blue, purple, or yellow. Minor injuries will give you a painless contusion, but more severe contusions may stay painful and swollen for a few weeks.  CAUSES  A facial or scalp contusion is caused by a blunt injury or trauma to the face or head area.  SIGNS AND SYMPTOMS   Swelling of the injured area.   Discoloration of the injured area.   Tenderness, soreness, or pain in the injured area.  DIAGNOSIS  The diagnosis can be made by taking a medical history and doing a physical exam. An X-ray exam, CT scan, or MRI may be needed to determine if there are any associated injuries, such as broken bones (fractures). TREATMENT  Often, the best treatment for a facial or scalp contusion is applying cold compresses to the injured area. Over-the-counter medicines may also be recommended for pain control.  HOME CARE INSTRUCTIONS   Only take over-the-counter or prescription medicines as directed by your health care provider.   Apply ice to the injured area.   Put ice in a plastic bag.   Place a towel between your skin and the bag.   Leave the ice on for 20 minutes, 2 3 times a day.  SEEK MEDICAL CARE IF:  You have bite problems.   You have pain with chewing.   You are concerned about facial defects. SEEK IMMEDIATE  MEDICAL CARE IF:  You have severe pain or a headache that is not relieved by medicine.   You have unusual sleepiness, confusion, or personality changes.   You throw up (vomit).   You have a persistent nosebleed.   You have double vision or blurred vision.   You have fluid drainage from your nose or ear.   You have difficulty walking or using your arms or legs.  MAKE SURE YOU:   Understand these instructions.  Will watch your condition.  Will get help right away if you are not doing well or get worse. Document Released: 10/26/2004 Document Revised: 07/09/2013 Document Reviewed: 05/01/2013 [facility] Patient Information 2014 [place], [place].  Facial Laceration  A facial laceration is a cut on the face. These injuries can be painful and cause bleeding. Lacerations usually heal quickly, but they need special care to reduce scarring. DIAGNOSIS  Your health care provider will take a medical history, ask for details about how the injury occurred, and examine the wound to determine how deep the cut is. TREATMENT  Some facial lacerations may not require closure. Others may not be able to be closed because of an increased risk of infection. The risk of infection and the chance for successful closure will depend on various factors, including the amount of time since the injury occurred. The wound may be cleaned to help prevent infection. If closure is appropriate, pain medicines may be given if needed.  Your health care provider will use stitches (sutures), wound glue (adhesive), or skin adhesive strips to repair the laceration. These tools bring the skin edges together to allow for faster healing and a better cosmetic outcome. If needed, you may also be given a tetanus shot. HOME CARE INSTRUCTIONS  Only take over-the-counter or prescription medicines as directed by your health care provider.  Follow your health care provider's instructions for wound care. These instructions will vary  depending on the technique used for closing the wound. For Sutures:  Keep the wound clean and dry.   If you were given a bandage (dressing), you should change it at least once a day. Also change the dressing if it becomes wet or dirty, or as directed by your health care provider.   Wash the wound with soap and water 2 times a day. Rinse the wound off with water to remove all soap. Pat the wound dry with a clean towel.   After cleaning, apply a thin layer of the antibiotic ointment recommended by your health care provider. This will help prevent infection and keep the dressing from sticking.   You may shower as usual after the first 24 hours. Do not soak the wound in water until the sutures are removed.   Get your sutures removed as directed by your health care provider. With facial lacerations, sutures should usually be taken out after 4 5 days to avoid stitch marks.   Wait a few days after your sutures are removed before applying any makeup. For Skin Adhesive Strips:  Keep the wound clean and dry.   Do not get the skin adhesive strips wet. You may bathe carefully, using caution to keep the wound dry.   If the wound gets wet, pat it dry with a clean towel.   Skin adhesive strips will fall off on their own. You may trim the strips as the wound heals. Do not remove skin adhesive strips that are still stuck to the wound. They will fall off in time.  For Wound Adhesive:  You may briefly wet your wound in the shower or bath. Do not soak or scrub the wound. Do not swim. Avoid periods of heavy sweating until the skin adhesive has fallen off on its own. After showering or bathing, gently pat the wound dry with a clean towel.   Do not apply liquid medicine, cream medicine, ointment medicine, or makeup to your wound while the skin adhesive is in place. This may loosen the film before your wound is healed.   If a dressing is placed over the wound, be careful not to apply tape directly  over the skin adhesive. This may cause the adhesive to be pulled off before the wound is healed.   Avoid prolonged exposure to sunlight or tanning lamps while the skin adhesive is in place.  The skin adhesive will usually remain in place for 5 10 days, then naturally fall off the skin. Do not pick at the adhesive film.  After Healing: Once the wound has healed, cover the wound with sunscreen during the day for 1 full year. This can help minimize scarring. Exposure to ultraviolet light in the first year will darken the scar. It can take 1 2 years for the scar to lose its redness and to heal completely.  SEEK IMMEDIATE MEDICAL CARE IF:  You have redness, pain, or swelling around the wound.   You see ayellowish-white fluid (pus) coming from the wound.   You have chills or a  fever.  MAKE SURE YOU:  Understand these instructions.  Will watch your condition.  Will get help right away if you are not doing well or get worse. Document Released: 10/26/2004 Document Revised: 07/09/2013 Document Reviewed: 05/01/2013 [facility] Patient Information 2014 [place], [place].
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
## 2014-02-10 NOTE — ED Provider Notes (Signed)
Medical screening examination/treatment/procedure(s) were performed by non-physician practitioner and as supervising physician I was immediately available for consultation/collaboration.   EKG Interpretation None       Nat Christen, MD 02/10/14 [id number]

## 2014-02-20 ENCOUNTER — Ambulatory Visit (INDEPENDENT_AMBULATORY_CARE_PROVIDER_SITE_OTHER): Payer: BC Managed Care – PPO | Admitting: Nurse Practitioner

## 2014-02-20 VITALS — BP 130/80 | Ht 64.5 in | Wt 144.0 lb

## 2014-02-20 DIAGNOSIS — S1093XA Contusion of unspecified part of neck, initial encounter: Secondary | ICD-10-CM

## 2014-02-20 DIAGNOSIS — S0083XA Contusion of other part of head, initial encounter: Secondary | ICD-10-CM

## 2014-02-20 DIAGNOSIS — F411 Generalized anxiety disorder: Secondary | ICD-10-CM

## 2014-02-20 DIAGNOSIS — S0180XA Unspecified open wound of other part of head, initial encounter: Secondary | ICD-10-CM

## 2014-02-20 DIAGNOSIS — F419 Anxiety disorder, unspecified: Secondary | ICD-10-CM

## 2014-02-20 DIAGNOSIS — S0181XA Laceration without foreign body of other part of head, initial encounter: Secondary | ICD-10-CM

## 2014-02-20 DIAGNOSIS — S0003XA Contusion of scalp, initial encounter: Secondary | ICD-10-CM

## 2014-02-20 DIAGNOSIS — F4310 Post-traumatic stress disorder, unspecified: Secondary | ICD-10-CM

## 2014-02-20 MED ORDER — BUPROPION HCL ER (XL) 150 MG PO TB24: 150.0000 mg | ORAL_TABLET | Freq: Every day | ORAL | Status: DC

## 2014-02-20 MED ORDER — CLONAZEPAM 0.5 MG PO TABS: ORAL_TABLET | ORAL | Status: DC

## 2014-02-23 ENCOUNTER — Encounter: Payer: Self-pay | Admitting: Nurse Practitioner
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
## 2014-02-24 ENCOUNTER — Telehealth: Payer: Self-pay | Admitting: Nurse Practitioner

## 2014-02-24 NOTE — Telephone Encounter (Signed)
As we discussed, I was worried about that happening. Is she willing to try Lexapro? We tried Wellbutrin because that is what she has taken in the past. Also encourage her to schedule counseling.
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
## 2014-02-24 NOTE — Telephone Encounter (Signed)
Patient said that she is experiencing a lot of agitation with her new med that she was prescribed on Friday. She wants to know if she needs to be seen again.

## 2014-02-25 ENCOUNTER — Encounter (HOSPITAL_COMMUNITY): Payer: Self-pay | Admitting: Emergency Medicine

## 2014-02-25 ENCOUNTER — Other Ambulatory Visit: Payer: Self-pay | Admitting: Nurse Practitioner

## 2014-02-25 ENCOUNTER — Emergency Department (HOSPITAL_COMMUNITY)
Admission: EM | Admit: 2014-02-25 | Discharge: 2014-02-25 | Disposition: A | Discharge status: Home / Self Care | Payer: BC Managed Care – PPO | Attending: Emergency Medicine | Admitting: Emergency Medicine

## 2014-02-25 DIAGNOSIS — F3289 Other specified depressive episodes: Secondary | ICD-10-CM | POA: Insufficient documentation

## 2014-02-25 DIAGNOSIS — F329 Major depressive disorder, single episode, unspecified: Secondary | ICD-10-CM | POA: Insufficient documentation

## 2014-02-25 DIAGNOSIS — R519 Headache, unspecified: Secondary | ICD-10-CM

## 2014-02-25 DIAGNOSIS — H539 Unspecified visual disturbance: Secondary | ICD-10-CM

## 2014-02-25 DIAGNOSIS — R51 Headache: Secondary | ICD-10-CM

## 2014-02-25 DIAGNOSIS — F17200 Nicotine dependence, unspecified, uncomplicated: Secondary | ICD-10-CM | POA: Insufficient documentation

## 2014-02-25 DIAGNOSIS — G8911 Acute pain due to trauma: Secondary | ICD-10-CM | POA: Insufficient documentation

## 2014-02-25 DIAGNOSIS — Z79899 Other long term (current) drug therapy: Secondary | ICD-10-CM | POA: Insufficient documentation

## 2014-02-25 DIAGNOSIS — G43909 Migraine, unspecified, not intractable, without status migrainosus: Secondary | ICD-10-CM | POA: Insufficient documentation

## 2014-02-25 DIAGNOSIS — F411 Generalized anxiety disorder: Secondary | ICD-10-CM | POA: Insufficient documentation

## 2014-02-25 DIAGNOSIS — M129 Arthropathy, unspecified: Secondary | ICD-10-CM | POA: Insufficient documentation

## 2014-02-25 DIAGNOSIS — Z8709 Personal history of other diseases of the respiratory system: Secondary | ICD-10-CM | POA: Insufficient documentation

## 2014-02-25 DIAGNOSIS — F988 Other specified behavioral and emotional disorders with onset usually occurring in childhood and adolescence: Secondary | ICD-10-CM | POA: Insufficient documentation

## 2014-02-25 MED ORDER — ESCITALOPRAM OXALATE 10 MG PO TABS: 10.0000 mg | ORAL_TABLET | Freq: Every day | ORAL | Status: DC

## 2014-02-25 MED ORDER — HYDROCODONE-ACETAMINOPHEN 5-325 MG PO TABS
2.0000 | ORAL_TABLET | ORAL | Status: DC | PRN
Start: 2014-02-25 — End: 2014-04-12
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
## 2014-02-25 NOTE — Telephone Encounter (Signed)
Patient has called back stating that Lexapro still has not been called in to [facility], that her head pain is much worse, went to ER last night was told it was her eye, eye doc told her this morning that it wasn't her eye, very worried due to pain worse, not pleased with treatment received at ER, please call pt soon

## 2014-02-25 NOTE — ED Notes (Signed)
Discharge instructions given and reviewed with patient.  Vicodin pre-pack given; patient informed regarding sedating effects of medication.  Patient ambulatory; discharged home in good condition.

## 2014-02-25 NOTE — ED Notes (Signed)
Dr. Sabra Heck in triage room to examine patient.
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
## 2014-02-25 NOTE — Discharge Instructions (Signed)
Please call your doctor for a followup appointment within 24-48 hours. When you talk to your doctor please let them know that you were seen in the emergency department and have them acquire all of your records so that they can discuss the findings with you and formulate a treatment plan to fully care for your new and ongoing problems. ° °

## 2014-02-25 NOTE — Telephone Encounter (Signed)
TC to patient: tenderness in temporal area to outer part of eye; dizzy; round yellow bright light in periphery; near syncope; nausea, no vomiting.

## 2014-02-25 NOTE — Telephone Encounter (Signed)
Patient said that she went to the eye doctor today and they said whatever is going on with her is not coming from her eye.

## 2014-02-26 NOTE — Progress Notes (Signed)
CT scheduled for 02/27/14 at 8am  Left message to return call

## 2014-02-26 NOTE — Progress Notes (Signed)
Patient notified of appointment.  

## 2014-02-27 ENCOUNTER — Ambulatory Visit (HOSPITAL_COMMUNITY)
Admission: RE | Admit: 2014-02-27 | Discharge: 2014-02-27 | Disposition: A | Discharge status: Home / Self Care | Payer: BC Managed Care – PPO | Source: Ambulatory Visit | Attending: Nurse Practitioner | Admitting: Nurse Practitioner

## 2014-02-27 ENCOUNTER — Telehealth: Payer: Self-pay | Admitting: Family Medicine

## 2014-02-27 DIAGNOSIS — R51 Headache: Secondary | ICD-10-CM | POA: Insufficient documentation

## 2014-02-27 DIAGNOSIS — H538 Other visual disturbances: Secondary | ICD-10-CM | POA: Insufficient documentation

## 2014-02-27 NOTE — Telephone Encounter (Signed)
Patient calling to get the results to her CT. I told her that [name] was out, and she is hoping Dr. Richardson Landry can sign off for her to know today.

## 2014-02-27 NOTE — Telephone Encounter (Signed)
Scan normal, cst, re ck in office as sched or earlier if symptoms persist

## 2014-02-27 NOTE — Telephone Encounter (Signed)
Results discussed with patient. Patient verbalized understanding.

## 2014-03-04 ENCOUNTER — Encounter (INDEPENDENT_AMBULATORY_CARE_PROVIDER_SITE_OTHER): Payer: BC Managed Care – PPO | Admitting: Ophthalmology

## 2014-03-10 MED FILL — Hydrocodone-Acetaminophen Tab 5-325 MG: ORAL | Qty: 6 | Status: AC

## 2014-03-17 ENCOUNTER — Other Ambulatory Visit: Payer: BC Managed Care – PPO | Admitting: Adult Health

## 2014-04-10 ENCOUNTER — Ambulatory Visit: Payer: BC Managed Care – PPO | Admitting: Nurse Practitioner

## 2014-04-12 ENCOUNTER — Emergency Department (HOSPITAL_COMMUNITY)
Admission: EM | Admit: 2014-04-12 | Discharge: 2014-04-12 | Disposition: A | Discharge status: Home / Self Care | Payer: BC Managed Care – PPO | Attending: Emergency Medicine | Admitting: Emergency Medicine

## 2014-04-12 ENCOUNTER — Other Ambulatory Visit: Payer: Self-pay

## 2014-04-12 ENCOUNTER — Emergency Department (HOSPITAL_COMMUNITY): Payer: BC Managed Care – PPO

## 2014-04-12 ENCOUNTER — Encounter (HOSPITAL_COMMUNITY): Payer: Self-pay | Admitting: Emergency Medicine

## 2014-04-12 DIAGNOSIS — F329 Major depressive disorder, single episode, unspecified: Secondary | ICD-10-CM | POA: Insufficient documentation

## 2014-04-12 DIAGNOSIS — G43909 Migraine, unspecified, not intractable, without status migrainosus: Secondary | ICD-10-CM | POA: Insufficient documentation

## 2014-04-12 DIAGNOSIS — R000 Tachycardia, unspecified: Secondary | ICD-10-CM | POA: Insufficient documentation

## 2014-04-12 DIAGNOSIS — R0789 Other chest pain: Secondary | ICD-10-CM

## 2014-04-12 DIAGNOSIS — Z8709 Personal history of other diseases of the respiratory system: Secondary | ICD-10-CM | POA: Insufficient documentation

## 2014-04-12 DIAGNOSIS — F3289 Other specified depressive episodes: Secondary | ICD-10-CM | POA: Insufficient documentation

## 2014-04-12 DIAGNOSIS — Z8739 Personal history of other diseases of the musculoskeletal system and connective tissue: Secondary | ICD-10-CM | POA: Insufficient documentation

## 2014-04-12 DIAGNOSIS — Z79899 Other long term (current) drug therapy: Secondary | ICD-10-CM | POA: Insufficient documentation

## 2014-04-12 DIAGNOSIS — F17200 Nicotine dependence, unspecified, uncomplicated: Secondary | ICD-10-CM | POA: Insufficient documentation

## 2014-04-12 DIAGNOSIS — R071 Chest pain on breathing: Secondary | ICD-10-CM | POA: Insufficient documentation

## 2014-04-12 DIAGNOSIS — F411 Generalized anxiety disorder: Secondary | ICD-10-CM | POA: Insufficient documentation

## 2014-04-12 LAB — COMPREHENSIVE METABOLIC PANEL
ALBUMIN: 3.8 g/dL (ref 3.5–5.2)
ALK PHOS: 75 U/L (ref 39–117)
ALT: 18 U/L (ref 0–35)
AST: 17 U/L (ref 0–37)
Anion gap: 12 (ref 5–15)
BILIRUBIN TOTAL: 0.2 mg/dL — AB (ref 0.3–1.2)
BUN: 4 mg/dL — ABNORMAL LOW (ref 6–23)
CHLORIDE: 106 meq/L (ref 96–112)
CO2: 23 mEq/L (ref 19–32)
Calcium: 9 mg/dL (ref 8.4–10.5)
Creatinine, Ser: 0.62 mg/dL (ref 0.50–1.10)
GFR calc Af Amer: >90 mL/min (ref >90)
GFR calc non Af Amer: >90 mL/min (ref >90)
Glucose, Bld: 95 mg/dL (ref 70–99)
POTASSIUM: 4.2 meq/L (ref 3.7–5.3)
SODIUM: 141 meq/L (ref 137–147)
Total Protein: 6.5 g/dL (ref 6.0–8.3)

## 2014-04-12 LAB — CBC WITH DIFFERENTIAL/PLATELET
BASOS ABS: 0 10*3/uL (ref 0.0–0.1)
BASOS PCT: 0 % (ref 0–1)
EOS ABS: 0 10*3/uL (ref 0.0–0.7)
Eosinophils Relative: 0 % (ref 0–5)
HCT: 38.3 % (ref 36.0–46.0)
Hemoglobin: 13.5 g/dL (ref 12.0–15.0)
Lymphocytes Relative: 16 % (ref 12–46)
Lymphs Abs: 1.7 10*3/uL (ref 0.7–4.0)
MCH: 31.5 pg (ref 26.0–34.0)
MCHC: 35.2 g/dL (ref 30.0–36.0)
MCV: 89.5 fL (ref 78.0–100.0)
Monocytes Absolute: 0.6 10*3/uL (ref 0.1–1.0)
Monocytes Relative: 6 % (ref 3–12)
NEUTROS ABS: 8.1 10*3/uL — AB (ref 1.7–7.7)
NEUTROS PCT: 78 % — AB (ref 43–77)
PLATELETS: 270 10*3/uL (ref 150–400)
RBC: 4.28 MIL/uL (ref 3.87–5.11)
RDW: 12.4 % (ref 11.5–15.5)
WBC: 10.5 10*3/uL (ref 4.0–10.5)

## 2014-04-12 LAB — TROPONIN I: Troponin I: <0.3 ng/mL (ref <0.30)

## 2014-04-12 NOTE — ED Provider Notes (Signed)
CSN: [id number]     Arrival date & time 04/12/14  1709 History   First MD Initiated Contact with Patient 04/12/14 1751     Chief Complaint  Patient presents with  . Dizziness  . Tachycardia     (Consider location/radiation/quality/duration/timing/severity/associated sxs/prior Treatment) HPI patient states 5 nights ago she started having a scratchiness of her throat however she states it was down in the chest area. She states when she breathes she feels like the ear is hot and he did indicate in her chest area. She denies any coughing or rhinorrhea. She states she feels the need to clear her throat. She denies any pain on swallowing. She does have shortness of breath sometimes when she's walking and sometimes when she's talking. She thinks she may have chills but has not had documented fever. She has been sneezing. She denies weight loss. She has had nausea without vomiting or diarrhea. The vomiting was only one time. She states at 3:30 this morning while at work she had a dizzy episode and felt like she was going to pass out. She reports she has been under a lot of stress because she is working a lot. She is working 70 hours this week and will be working for another 5-7 days before she has a day off including tonight. She states some week she only works 40 hours and then several weeks she works 75. She states she sweats a lot at work however she drinks Gatorade. She also drinks a moderate amount of caffeine in the form of tea. Patient is concerned that they are remodeling at work. She states they have put up barriers around the bathroom because they have found mold in the bathroom.  Patient states she has seen a cardiologist in the past for her rapid heartbeat. She was told it was from anxiety. Patient had a pneumothorax in October 2011 to states this feels different.  PCP Dr Wolfgang Phoenix  Past Medical History  Diagnosis Date  . Anxiety   . Depression   . Pneumothorax, spontaneous, tension   . PTSD  (post-traumatic stress disorder)   . Arthritis   . DJD (degenerative joint disease)   . Scoliosis   . Fatigue 12/24/2012  . ADD (attention deficit disorder) 12/24/2012  . Hx of migraine headaches 12/24/2012   Past Surgical History  Procedure Laterality Date  . Knee surgery    . Cesarean section    . Bone spurs toes Right    Family History  Problem Relation Age of Onset  . Hypertension Father   . Atrial fibrillation Father   . Emphysema Mother   . Cancer Maternal Grandmother     skin   . Heart disease Maternal Grandmother   . Heart disease Paternal Grandfather   . COPD Paternal Grandfather   . Hypertension Paternal Grandfather   . Diabetes Paternal Grandfather    History  Substance Use Topics  . Smoking status: Current Every Day Smoker -- 1.50 packs/day for 15 years    Types: Cigarettes  . Smokeless tobacco: Never Used     Comment: "working on it" Was smoking 2.5 packs a day  . Alcohol Use: No     Comment: occ   Employed Smokes 1 ppd  OB History   Grav Para Term Preterm Abortions TAB SAB Ect Mult Living   3 2 2  1    1 3      Review of Systems  All other systems reviewed and are negative.     Allergies  Review of patient's allergies indicates no known allergies.  Home Medications   Prior to Admission medications   Medication Sig Start Date End Date Taking? Authorizing Provider  clonazePAM (KLONOPIN) 0.5 MG tablet Take 0.5 mg by mouth daily.   Yes Historical Provider, MD  escitalopram (LEXAPRO) 10 MG tablet Take 1 tablet (10 mg total) by mouth daily. 02/25/14 02/25/15 Yes Nilda Simmer, NP  ibuprofen (ADVIL,MOTRIN) 200 MG tablet Take 400 mg by mouth every 6 (six) hours as needed. Pain    Historical Provider, MD   BP 114/76  Pulse 87  Temp(Src) 99.6 F (37.6 C) (Oral)  Resp 14  Ht 5' 4.5" (1.638 m)  Wt 135 lb (61.236 kg)  BMI 22.82 kg/m2  SpO2 100%  LMP 04/05/2014  Vital signs normal   Physical Exam  Nursing note and vitals  reviewed. Constitutional: She is oriented to person, place, and time. She appears well-developed and well-nourished.  Non-toxic appearance. She does not appear ill. No distress.  HENT:  Head: Normocephalic and atraumatic.  Right Ear: External ear normal.  Left Ear: External ear normal.  Nose: Nose normal. No mucosal edema or rhinorrhea.  Mouth/Throat: Oropharynx is clear and moist and mucous membranes are normal. No dental abscesses or uvula swelling.  Eyes: Conjunctivae and EOM are normal. Pupils are equal, round, and reactive to light.  Neck: Normal range of motion and full passive range of motion without pain. Neck supple.  Cardiovascular: Normal rate, regular rhythm and normal heart sounds.  Exam reveals no gallop and no friction rub.   No murmur heard. Pulmonary/Chest: Effort normal and breath sounds normal. No respiratory distress. She has no wheezes. She has no rhonchi. She has no rales. She exhibits no tenderness and no crepitus.  Abdominal: Soft. Normal appearance and bowel sounds are normal. She exhibits no distension. There is no tenderness. There is no rebound and no guarding.  Musculoskeletal: Normal range of motion. She exhibits no edema and no tenderness.  Moves all extremities well.   Neurological: She is alert and oriented to person, place, and time. She has normal strength. No cranial nerve deficit.  Skin: Skin is warm, dry and intact. No rash noted. No erythema. No pallor.  Psychiatric: Her speech is normal and behavior is normal. Her mood appears anxious.    ED Course  Procedures (including critical care time)  Pt's heart rate improved into the 80's during our interview.    Labs Review Results for orders placed during the hospital encounter of 04/12/14  CBC WITH DIFFERENTIAL      Result Value Ref Range   WBC 10.5  4.0 - 10.5 K/uL   RBC 4.28  3.87 - 5.11 MIL/uL   Hemoglobin 13.5  12.0 - 15.0 g/dL   HCT 38.3  36.0 - 46.0 %   MCV 89.5  78.0 - 100.0 fL   MCH 31.5   26.0 - 34.0 pg   MCHC 35.2  30.0 - 36.0 g/dL   RDW 12.4  11.5 - 15.5 %   Platelets 270  150 - 400 K/uL   Neutrophils Relative % 78 (*) 43 - 77 %   Neutro Abs 8.1 (*) 1.7 - 7.7 K/uL   Lymphocytes Relative 16  12 - 46 %   Lymphs Abs 1.7  0.7 - 4.0 K/uL   Monocytes Relative 6  3 - 12 %   Monocytes Absolute 0.6  0.1 - 1.0 K/uL   Eosinophils Relative 0  0 - 5 %   Eosinophils Absolute  0.0  0.0 - 0.7 K/uL   Basophils Relative 0  0 - 1 %   Basophils Absolute 0.0  0.0 - 0.1 K/uL  COMPREHENSIVE METABOLIC PANEL      Result Value Ref Range   Sodium 141  137 - 147 mEq/L   Potassium 4.2  3.7 - 5.3 mEq/L   Chloride 106  96 - 112 mEq/L   CO2 23  19 - 32 mEq/L   Glucose, Bld 95  70 - 99 mg/dL   BUN 4 (*) 6 - 23 mg/dL   Creatinine, Ser 0.62  0.50 - 1.10 mg/dL   Calcium 9.0  8.4 - 10.5 mg/dL   Total Protein 6.5  6.0 - 8.3 g/dL   Albumin 3.8  3.5 - 5.2 g/dL   AST 17  0 - 37 U/L   ALT 18  0 - 35 U/L   Alkaline Phosphatase 75  39 - 117 U/L   Total Bilirubin 0.2 (*) 0.3 - 1.2 mg/dL   GFR calc non Af Amer >90  >90 mL/min   GFR calc Af Amer >90  >90 mL/min   Anion gap 12  5 - 15  TROPONIN I      Result Value Ref Range   Troponin I <0.30  <0.30 ng/mL   Laboratory interpretation all normal      Imaging Review Dg Chest 2 View  04/12/2014   CLINICAL DATA:  Chest pain, dizziness, and shortness of breath.  EXAM: CHEST  2 VIEW  COMPARISON:  08/13/2011  FINDINGS: Heart size and pulmonary vascularity are normal and the lungs are clear. There is a slight thoracic scoliosis, unchanged.  IMPRESSION: No acute abnormality.   Electronically Signed   By: Rozetta Nunnery M.D.   On: 04/12/2014 20:00     EKG Interpretation   Date/Time:  Sunday April 12 2014 17:21:53 EDT Ventricular Rate:  106 PR Interval:  178 QRS Duration: 82 QT Interval:  332 QTC Calculation: 441 R Axis:   86 Text Interpretation:  Sinus tachycardia Possible Left atrial enlargement  Possible Anterior infarct , age undetermined Since last  tracing rate  slower Confirmed by Malayia Spizzirri  MD-I, Aliea Bobe ([id number]) on 04/12/2014 7:17:54 PM      MDM   Final diagnoses:  Atypical chest pain  Tachycardia    Plan discharge  Rolland Porter, MD, Alanson Aly, MD 04/12/14 2121

## 2014-04-12 NOTE — ED Notes (Signed)
Patient reports feeling dizzy with "heart pounding" and shortness of breath x1 week. Per patient renovations being work on and told their was mold in bathroom. Patient states "I feel worse when I am at work." Patient also reports a headache.

## 2014-04-12 NOTE — Discharge Instructions (Signed)
Your tests tonight are normal. Try to get a lot of rest and drink a lot of fluids. Recheck if you get fever, cough up yellow or green mucus, have pain on swallowing or seems worse.

## 2014-04-12 NOTE — ED Notes (Signed)
Pt c/o body aches that started two days ago that are worse with activity, hoarseness that started Wednesday,  Chills that started last night, pt concerned with symptoms because she states that she is exposed to "bad" mold from remodeling a bathroom at work and [place], pt does report that she had one episode of nausea with dizziness last night that went away.

## 2014-05-18 ENCOUNTER — Ambulatory Visit: Payer: BC Managed Care – PPO | Admitting: Nurse Practitioner

## 2014-07-15 ENCOUNTER — Ambulatory Visit (INDEPENDENT_AMBULATORY_CARE_PROVIDER_SITE_OTHER): Payer: BC Managed Care – PPO | Admitting: Adult Health

## 2014-07-15 ENCOUNTER — Encounter: Payer: Self-pay | Admitting: Adult Health

## 2014-07-15 VITALS — BP 148/74 | Ht 64.5 in | Wt 136.5 lb

## 2014-07-15 DIAGNOSIS — N946 Dysmenorrhea, unspecified: Secondary | ICD-10-CM | POA: Insufficient documentation

## 2014-07-15 DIAGNOSIS — N926 Irregular menstruation, unspecified: Secondary | ICD-10-CM

## 2014-07-15 HISTORY — DX: Dysmenorrhea, unspecified: N94.6

## 2014-07-15 HISTORY — DX: Irregular menstruation, unspecified: N92.6

## 2014-07-15 NOTE — Progress Notes (Signed)
Subjective:     Patient ID: Destiny Orr, female   DOB: Nov 01, 1980, 33 y.o.   MRN: [id number]  HPI Destiny Orr is a 33 year old white female in complaining of painful periods and bleeding irregularly.She may bleed heavy then stop for a day then bleed some more.She is working 3rd shift, and has been using condoms.  Review of Systems See HPI Reviewed past medical,surgical, social and family history. Reviewed medications and allergies.     Objective:   Physical Exam BP 148/74  Ht 5' 4.5" (1.638 m)  Wt 136 lb 8 oz (61.916 kg)  BMI 23.08 kg/m2  LMP 07/13/2014   Skin warm and dry.Pelvic: external genitalia is normal in appearance, vagina: good color,moisture and rugae, cervix:smooth and bulbous, uterus: normal size, shape and contour,mildly tender, no masses felt, adnexa: no masses or tenderness noted.  Assessment:     Dysmenorrhea Irregular bleeding    Plan:     Return in 1 week for [place] and see me Review handout on dysmenorrhea    Will schedule pap and physical at follow up

## 2014-07-15 NOTE — Patient Instructions (Signed)
Dysmenorrhea Menstrual cramps (dysmenorrhea) are caused by the muscles of the uterus tightening (contracting) during a menstrual period. For some women, this discomfort is merely bothersome. For others, dysmenorrhea can be severe enough to interfere with everyday activities for a few days each month. Primary dysmenorrhea is menstrual cramps that last a couple of days when you start having menstrual periods or soon after. This often begins after a teenager starts having her period. As a woman gets older or has a baby, the cramps will usually lessen or disappear. Secondary dysmenorrhea begins later in life, lasts longer, and the pain may be stronger than primary dysmenorrhea. The pain may start before the period and last a few days after the period.  CAUSES  Dysmenorrhea is usually caused by an underlying problem, such as:  The tissue lining the uterus grows outside of the uterus in other areas of the body (endometriosis).  The endometrial tissue, which normally lines the uterus, is found in or grows into the muscular walls of the uterus (adenomyosis).  The pelvic blood vessels are engorged with blood just before the menstrual period (pelvic congestive syndrome).  Overgrowth of cells (polyps) in the lining of the uterus or cervix.  Falling down of the uterus (prolapse) because of loose or stretched ligaments.  Depression.  Bladder problems, infection, or inflammation.  Problems with the intestine, a tumor, or irritable bowel syndrome.  Cancer of the female organs or bladder.  A severely tipped uterus.  A very tight opening or closed cervix.  Noncancerous tumors of the uterus (fibroids).  Pelvic inflammatory disease (PID).  Pelvic scarring (adhesions) from a previous surgery.  Ovarian cyst.  An intrauterine device (IUD) used for birth control. RISK FACTORS You may be at greater risk of dysmenorrhea if:  You are younger than age 62.  You started puberty early.  You have  irregular or heavy bleeding.  You have never given birth.  You have a family history of this problem.  You are a smoker. SIGNS AND SYMPTOMS   Cramping or throbbing pain in your lower abdomen.  Headaches.  Lower back pain.  Nausea or vomiting.  Diarrhea.  Sweating or dizziness.  Loose stools. DIAGNOSIS  A diagnosis is based on your history, symptoms, physical exam, diagnostic tests, or procedures. Diagnostic tests or procedures may include:  Blood tests.  Ultrasonography.  An examination of the lining of the uterus (dilation and curettage, D&C).  An examination inside your abdomen or pelvis with a scope (laparoscopy).  X-rays.  CT scan.  MRI.  An examination inside the bladder with a scope (cystoscopy).  An examination inside the intestine or stomach with a scope (colonoscopy, gastroscopy). TREATMENT  Treatment depends on the cause of the dysmenorrhea. Treatment may include:  Pain medicine prescribed by your health care provider.  Birth control pills or an IUD with progesterone hormone in it.  Hormone replacement therapy.  Nonsteroidal anti-inflammatory drugs (NSAIDs). These may help stop the production of prostaglandins.  Surgery to remove adhesions, endometriosis, ovarian cyst, or fibroids.  Removal of the uterus (hysterectomy).  Progesterone shots to stop the menstrual period.  Cutting the nerves on the sacrum that go to the female organs (presacral neurectomy).  Electric current to the sacral nerves (sacral nerve stimulation).  Antidepressant medicine.  Psychiatric therapy, counseling, or group therapy.  Exercise and physical therapy.  Meditation and yoga therapy.  Acupuncture. HOME CARE INSTRUCTIONS   Only take over-the-counter or prescription medicines as directed by your health care provider.  Place a heating pad  or hot water bottle on your lower back or abdomen. Do not sleep with the heating pad.  Use aerobic exercises, walking,  swimming, biking, and other exercises to help lessen the cramping.  Massage to the lower back or abdomen may help.  Stop smoking.  Avoid alcohol and caffeine. SEEK MEDICAL CARE IF:   Your pain does not get better with medicine.  You have pain with sexual intercourse.  Your pain increases and is not controlled with medicines.  You have abnormal vaginal bleeding with your period.  You develop nausea or vomiting with your period that is not controlled with medicine. SEEK IMMEDIATE MEDICAL CARE IF:  You pass out.  Document Released: 09/18/2005 Document Revised: 05/21/2013 Document Reviewed: 03/06/2013 [facility] Patient Information 2015 [place], [place]. This information is not intended to replace advice given to you by your health care provider. Make sure you discuss any questions you have with your health care provider. Return in 1 week for [place]

## 2014-07-22 ENCOUNTER — Ambulatory Visit (INDEPENDENT_AMBULATORY_CARE_PROVIDER_SITE_OTHER): Payer: BC Managed Care – PPO | Admitting: Adult Health

## 2014-07-22 ENCOUNTER — Ambulatory Visit (INDEPENDENT_AMBULATORY_CARE_PROVIDER_SITE_OTHER): Payer: BC Managed Care – PPO

## 2014-07-22 ENCOUNTER — Encounter: Payer: Self-pay | Admitting: Adult Health

## 2014-07-22 VITALS — BP 112/68 | Ht 64.5 in | Wt 140.0 lb

## 2014-07-22 DIAGNOSIS — N926 Irregular menstruation, unspecified: Secondary | ICD-10-CM

## 2014-07-22 DIAGNOSIS — N946 Dysmenorrhea, unspecified: Secondary | ICD-10-CM

## 2014-07-22 DIAGNOSIS — Z30013 Encounter for initial prescription of injectable contraceptive: Secondary | ICD-10-CM

## 2014-07-22 MED ORDER — MEDROXYPROGESTERONE ACETATE 150 MG/ML IM SUSP: 150.0000 mg | INTRAMUSCULAR | Status: DC

## 2014-07-22 NOTE — Patient Instructions (Signed)

## 2014-07-22 NOTE — Progress Notes (Signed)
Subjective:     Patient ID: Destiny Orr, female   DOB: [birth year], 33 y.o.   MRN: [id number]  HPI Destiny Orr is a 33 year old white female in for [place] for dysmenorrhea and irregular periods.  Review of Systems See HPI Reviewed past medical,surgical, social and family history. Reviewed medications and allergies.     Objective:   Physical Exam BP 112/68  Ht 5' 4.5" (1.638 m)  Wt 140 lb (63.504 kg)  BMI 23.67 kg/m2  LMP 10/12/2015Discussed [place] with pt:   Uterus 8.4 x 5.4 x 3.7 cm, anteverted  Endometrium 7.5 mm, symmetrical, with small amount fluid noted within the cavity  Right ovary 2.9 x 1.6 x 1.4 cm,  Left ovary 3.2 x 2.7 x 2.4 cm, with 2.3 x 3.1DV simple follicular cyst noted  No free fluid noted within the pelvis  Technician Comments:  Anteverted uterus, symmetrical Endometrial cavity noted with small amount of fluid noted, Rt ovary appears WNL, LT ovary with simple follicular cyst noted = 2.3 x 1.7cm, no free fluid noted within the pelvis  Discussed that cyst is from ovulation and that going back on birth control should help and she wants to go on depo., has used in past and liked it.   Assessment:     Dysmenorrhea Irregular period Contraceptive management    Plan:     Rx Depo provera 150 mg, disp.# 1 [place] for IM injection every 3 months in office with 4 refills, call with period for first shot Review handout on depo

## 2014-08-03 ENCOUNTER — Encounter: Payer: Self-pay | Admitting: Adult Health

## 2014-08-07 ENCOUNTER — Ambulatory Visit (INDEPENDENT_AMBULATORY_CARE_PROVIDER_SITE_OTHER): Payer: BC Managed Care – PPO | Admitting: Adult Health

## 2014-08-07 ENCOUNTER — Encounter: Payer: Self-pay | Admitting: Adult Health

## 2014-08-07 DIAGNOSIS — Z3202 Encounter for pregnancy test, result negative: Secondary | ICD-10-CM

## 2014-08-07 DIAGNOSIS — Z3042 Encounter for surveillance of injectable contraceptive: Secondary | ICD-10-CM

## 2014-08-07 LAB — POCT URINE PREGNANCY: Preg Test, Ur: NEGATIVE

## 2014-08-07 MED ORDER — MEDROXYPROGESTERONE ACETATE 150 MG/ML IM SUSP
150.0000 mg | Freq: Once | INTRAMUSCULAR | Status: AC
  Administered 2014-08-07: 150 mg via INTRAMUSCULAR

## 2014-08-14 ENCOUNTER — Encounter: Payer: Self-pay | Admitting: Family Medicine

## 2014-08-14 ENCOUNTER — Ambulatory Visit (HOSPITAL_COMMUNITY)
Admission: RE | Admit: 2014-08-14 | Discharge: 2014-08-14 | Disposition: A | Discharge status: Home / Self Care | Payer: BC Managed Care – PPO | Source: Ambulatory Visit | Attending: Family Medicine | Admitting: Family Medicine

## 2014-08-14 ENCOUNTER — Ambulatory Visit (INDEPENDENT_AMBULATORY_CARE_PROVIDER_SITE_OTHER): Payer: BC Managed Care – PPO | Admitting: Family Medicine

## 2014-08-14 VITALS — BP 120/80 | Temp 98.7°F | Ht 64.5 in | Wt 137.0 lb

## 2014-08-14 DIAGNOSIS — M79674 Pain in right toe(s): Secondary | ICD-10-CM | POA: Insufficient documentation

## 2014-08-14 DIAGNOSIS — M79671 Pain in right foot: Secondary | ICD-10-CM

## 2014-08-14 DIAGNOSIS — M19071 Primary osteoarthritis, right ankle and foot: Secondary | ICD-10-CM | POA: Insufficient documentation

## 2014-08-14 MED ORDER — HYDROCODONE-ACETAMINOPHEN 5-325 MG PO TABS
1.0000 | ORAL_TABLET | ORAL | Status: DC | PRN
Start: 2014-08-14 — End: 2014-09-28
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
## 2014-09-28 ENCOUNTER — Ambulatory Visit (INDEPENDENT_AMBULATORY_CARE_PROVIDER_SITE_OTHER): Payer: BC Managed Care – PPO | Admitting: Nurse Practitioner

## 2014-09-28 ENCOUNTER — Encounter: Payer: Self-pay | Admitting: Nurse Practitioner

## 2014-09-28 ENCOUNTER — Other Ambulatory Visit: Payer: Self-pay | Admitting: Nurse Practitioner

## 2014-09-28 VITALS — BP 140/70 | Temp 99.8°F | Ht 64.5 in | Wt 138.6 lb

## 2014-09-28 DIAGNOSIS — R1314 Dysphagia, pharyngoesophageal phase: Secondary | ICD-10-CM

## 2014-09-28 DIAGNOSIS — R946 Abnormal results of thyroid function studies: Secondary | ICD-10-CM

## 2014-09-28 DIAGNOSIS — F419 Anxiety disorder, unspecified: Secondary | ICD-10-CM

## 2014-09-28 MED ORDER — CITALOPRAM HYDROBROMIDE 20 MG PO TABS: 20.0000 mg | ORAL_TABLET | Freq: Every day | ORAL | Status: DC

## 2014-09-28 MED ORDER — CLONAZEPAM 0.5 MG PO TABS: 0.5000 mg | ORAL_TABLET | Freq: Two times a day (BID) | ORAL | Status: DC | PRN

## 2014-09-29 ENCOUNTER — Telehealth: Payer: Self-pay

## 2014-09-29 DIAGNOSIS — R1310 Dysphagia, unspecified: Secondary | ICD-10-CM

## 2014-09-29 LAB — TSH: TSH: 1.612 u[IU]/mL (ref 0.350–4.500)

## 2014-09-29 NOTE — Telephone Encounter (Signed)
Pt was seen yesterday and [name] sent her for labs. The pt stated  That she forgot to tell [name] about checking her vitamin b levels, And so the pt is wondering if it is too late for it to be added to the labs That was done yesterday.

## 2014-09-30 ENCOUNTER — Telehealth: Payer: Self-pay

## 2014-09-30 NOTE — Telephone Encounter (Signed)
error 

## 2014-10-01 ENCOUNTER — Encounter: Payer: Self-pay | Admitting: Nurse Practitioner

## 2014-10-01 LAB — VITAMIN B12: Vitamin B-12: 264 pg/mL (ref 211–911)

## 2014-10-01 NOTE — Telephone Encounter (Signed)
Please check to see if we can add this

## 2014-10-01 NOTE — Progress Notes (Signed)
Subjective:  Presents complaints of "a knot in her throat". Started off about a month ago, occurred off and on. Has been constant for 5 days. Food gets stuck in the lower throat area. No sore throat. No hoarseness. Feels like the area is partially blocked. Taking fluids without difficulty. No acid reflux or heartburn. No fever vomiting or abdominal pain. Smoker. Worse on the right side. Has had an exacerbation of her anxiety due to personal issues. Would like to restart medication.  Objective:   BP 140/70 mmHg  Temp(Src) 99.8 F (37.7 C) (Oral)  Ht 5' 4.5" (1.638 m)  Wt 138 lb 9.6 oz (62.869 kg)  BMI 23.43 kg/m2 NAD. Alert, oriented. TMs mild clear effusion, no erythema. Pharynx clear. Neck supple with minimal anterior adenopathy. Thyroid slightly larger on the right, no masses or goiter noted. Nontender to palpation. Lungs clear. Heart regular rate rhythm. Abdomen soft nondistended nontender.  Assessment:  Problem List Items Addressed This Visit      Other   Anxiety   Relevant Medications      citalopram (CELEXA) tablet    Other Visit Diagnoses    Abnormal thyroid exam    -  Primary    Relevant Orders       TSH (Completed)       US Soft Tissue Head/Neck    Dysphagia, pharyngoesophageal phase          Plan:  Meds ordered this encounter  Medications  . citalopram (CELEXA) 20 MG tablet    Sig: Take 1 tablet (20 mg total) by mouth daily.    Dispense:  30 tablet    Refill:  0    Order Specific Question:  Supervising Provider    Answer:  Mikey Kirschner [[id number]]  . clonazePAM (KLONOPIN) 0.5 MG tablet    Sig: Take 1 tablet (0.5 mg total) by mouth 2 (two) times daily as needed for anxiety.    Dispense:  40 tablet    Refill:  2    Order Specific Question:  Supervising Provider    Answer:  Mikey Kirschner [[id number]]     Plan: Further follow-up based on ultrasound report. Trial of Celexa as directed. Restart Klonopin as directed. Return in about 1 month (around 10/29/2014). Call  back sooner if any problems.

## 2014-10-01 NOTE — Telephone Encounter (Signed)
Labs added per [employer].

## 2014-10-05 ENCOUNTER — Ambulatory Visit (HOSPITAL_COMMUNITY)
Admission: RE | Admit: 2014-10-05 | Discharge: 2014-10-05 | Disposition: A | Discharge status: Home / Self Care | Payer: BLUE CROSS/BLUE SHIELD | Source: Ambulatory Visit | Attending: Nurse Practitioner | Admitting: Nurse Practitioner

## 2014-10-05 DIAGNOSIS — E079 Disorder of thyroid, unspecified: Secondary | ICD-10-CM | POA: Diagnosis present

## 2014-10-05 DIAGNOSIS — E041 Nontoxic single thyroid nodule: Secondary | ICD-10-CM | POA: Diagnosis not present

## 2014-10-07 ENCOUNTER — Telehealth: Payer: Self-pay | Admitting: *Deleted

## 2014-10-07 NOTE — Telephone Encounter (Signed)
Continue oral B12. We do injections for people who have trouble absorbing B12 (called pernicious anemia). Her level is improved. Multiple causes for fatigue. Recheck if persists.

## 2014-10-07 NOTE — Telephone Encounter (Signed)
[facility] to go over pt ultrasound results.

## 2014-10-07 NOTE — Telephone Encounter (Signed)
Discussed with patient

## 2014-10-07 NOTE — Telephone Encounter (Signed)
Pt calling about B12 level. She is taking Vit B12 500 once daily. She wants to know if she can start B12 injection. She is having fatigue.

## 2014-10-08 NOTE — Telephone Encounter (Signed)
Patient notified and verbalized understanding. 

## 2014-10-22 ENCOUNTER — Telehealth: Payer: Self-pay | Admitting: Nurse Practitioner

## 2014-10-22 NOTE — Telephone Encounter (Signed)
Pt said she already tried the 40mg  of Celexa and it did not work. Said that [facility] would set her up on a different medication. I explained to her that [name] will not be in until Monday, pt was OK with that. She will wait until Monday for a different medication.

## 2014-10-22 NOTE — Telephone Encounter (Signed)
citalopram (CELEXA) 20 MG tablet   Pt is on this trial med but says its doing about the same as  lexapro did. Still feeling irritable, no emotions.   Would like to try something she spoke to her friend about  Viiybryd  And she would like this done before this weekend please   [name]

## 2014-10-22 NOTE — Telephone Encounter (Signed)
Was seen by [name] on 09/28/14

## 2014-10-22 NOTE — Telephone Encounter (Signed)
incr to 40 mg, f u with carolynb in one mo if not one already sched

## 2014-10-26 NOTE — Telephone Encounter (Signed)
Pt called back about changing meds. Pt wanted to let [name] know That she is having a hard time sleeping and is waking up with cold sweats.

## 2014-10-27 ENCOUNTER — Other Ambulatory Visit: Payer: Self-pay | Admitting: Nurse Practitioner

## 2014-10-27 MED ORDER — LORAZEPAM 1 MG PO TABS: 1.0000 mg | ORAL_TABLET | Freq: Every day | ORAL | Status: DC

## 2014-10-27 MED ORDER — CITALOPRAM HYDROBROMIDE 20 MG PO TABS: 20.0000 mg | ORAL_TABLET | Freq: Every day | ORAL | Status: DC

## 2014-10-27 NOTE — Telephone Encounter (Signed)
Is this since she started Celexa? Does Klonopin help with sleep at all?

## 2014-10-27 NOTE — Telephone Encounter (Signed)
Recommend she give the Celexa more time since she had sweats before starting medication. This may improve over time. Also I will send in something else for sleep. Please remind her not to take medicine for sleep within 4 hours of taking Klonopin due to potential for excessive drowsiness. Follow up as planned. Thanks.

## 2014-10-27 NOTE — Telephone Encounter (Signed)
Notified patient she give the Celexa more time since she had sweats before starting medication. This may improve over time. Also [name] will send in something else for sleep. Reminded her not to take medicine for sleep within 4 hours of taking Klonopin due to potential for excessive drowsiness. Follow up as planned. Patient verbalized understanding.

## 2014-10-27 NOTE — Telephone Encounter (Signed)
Patient states that she was having cold sweats before the Celexa but it has increased slightly since starting the medication. The patient states that the Klonopin does not help her sleep anymore.

## 2014-10-29 ENCOUNTER — Ambulatory Visit (INDEPENDENT_AMBULATORY_CARE_PROVIDER_SITE_OTHER): Payer: BLUE CROSS/BLUE SHIELD | Admitting: Otolaryngology

## 2014-10-30 ENCOUNTER — Encounter: Payer: Self-pay | Admitting: Obstetrics & Gynecology

## 2014-10-30 ENCOUNTER — Ambulatory Visit: Payer: BC Managed Care – PPO

## 2014-11-02 ENCOUNTER — Ambulatory Visit (INDEPENDENT_AMBULATORY_CARE_PROVIDER_SITE_OTHER): Payer: BLUE CROSS/BLUE SHIELD | Admitting: *Deleted

## 2014-11-02 ENCOUNTER — Encounter: Payer: Self-pay | Admitting: *Deleted

## 2014-11-02 DIAGNOSIS — Z3202 Encounter for pregnancy test, result negative: Secondary | ICD-10-CM

## 2014-11-02 DIAGNOSIS — Z3042 Encounter for surveillance of injectable contraceptive: Secondary | ICD-10-CM

## 2014-11-02 LAB — POCT URINE PREGNANCY: PREG TEST UR: NEGATIVE

## 2014-11-02 MED ORDER — MEDROXYPROGESTERONE ACETATE 150 MG/ML IM SUSP
150.0000 mg | Freq: Once | INTRAMUSCULAR | Status: AC
  Administered 2014-11-02: 150 mg via INTRAMUSCULAR

## 2014-11-02 NOTE — Progress Notes (Signed)
Pt here for Depo. Noticed some vaginal bleeding 3 weeks ago but bleeding has now stopped. Pt to return in 12 weeks for next shot. [place]

## 2014-11-26 ENCOUNTER — Ambulatory Visit (INDEPENDENT_AMBULATORY_CARE_PROVIDER_SITE_OTHER): Payer: BLUE CROSS/BLUE SHIELD | Admitting: Nurse Practitioner

## 2014-11-26 ENCOUNTER — Encounter: Payer: Self-pay | Admitting: Nurse Practitioner

## 2014-11-26 VITALS — BP 122/82 | Ht 64.5 in | Wt 142.0 lb

## 2014-11-26 DIAGNOSIS — F419 Anxiety disorder, unspecified: Secondary | ICD-10-CM

## 2014-11-26 DIAGNOSIS — F32A Depression, unspecified: Secondary | ICD-10-CM

## 2014-11-26 DIAGNOSIS — F329 Major depressive disorder, single episode, unspecified: Secondary | ICD-10-CM | POA: Insufficient documentation

## 2014-11-26 MED ORDER — ALPRAZOLAM 0.5 MG PO TABS: 0.5000 mg | ORAL_TABLET | Freq: Two times a day (BID) | ORAL | Status: DC | PRN

## 2014-11-26 MED ORDER — ARIPIPRAZOLE 2 MG PO TABS: ORAL_TABLET | ORAL | Status: DC

## 2014-11-27 ENCOUNTER — Encounter: Payer: Self-pay | Admitting: Nurse Practitioner

## 2014-11-27 NOTE — Progress Notes (Signed)
Subjective:  Presents for complaints of significant depression. Has been on multiple meds over the years including most of the SSRI meds, Wellbutrin, Effexor and Seroquel. Has either had side effects or an extremely flat affect. Was receiving counseling in [place] for several years but her mental health counselor moved to [place] and has not continued since. Complaints of overwhelming fatigue lack of interest in family and daily activities. 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 Sleep disturbance. Emotional lability. Crying frequently. Denies suicidal or homicidal thoughts or ideation. Gen. feeling of hopelessness and feeling overwhelmed. Call in behavioral health, next appointment was one month away and patient felt she could not wait that long. Did not make appointment. Would like to stay local for her care. Would like to try Abilify for her symptoms. Klonopin helps anxiety but takes a long time to take effect.  Objective:   BP 122/82 mmHg  Ht 5' 4.5" (1.638 m)  Wt 142 lb (64.411 kg)  BMI 24.01 kg/m2 NAD. Alert, oriented. Thoughts logical coherent and relevant. Dressed appropriately. Crying at times during office visit. Lungs clear. Heart regular rate rhythm.  Assessment:  Problem List Items Addressed This Visit      Other   Anxiety - Primary   Relevant Medications   ALPRAZolam Duanne Moron) tablet   Depression   Relevant Medications   ALPRAZolam Duanne Moron) tablet     Plan:  Meds ordered this encounter  Medications  . ARIPiprazole (ABILIFY) 2 MG tablet    Sig: One po qhs x 6 d then 2 po qhs    Dispense:  49 tablet    Refill:  0    Order Specific Question:  Supervising Provider    Answer:  Mikey Kirschner [[id number]]  . ALPRAZolam (XANAX) 0.5 MG tablet    Sig: Take 1 tablet (0.5 mg total) by mouth 2 (two) times daily as needed for anxiety.    Dispense:  40 tablet    Refill:  2    Order Specific Question:  Supervising Provider    Answer:  Mikey Kirschner [[id number]]   Based on patient's description, feel there is an underlying psychiatric diagnosis that needs to be evaluated. Will start on low-dose Abilify 2 mg and slowly titrate if tolerated. Patient to DC medicine and call if any adverse effects. Switch to Xanax to see if this will work more quickly for her anxiety symptoms. Drowsiness precautions. Refer to mental health for further evaluation. Patient to seek help immediately if she becomes suicidal or homicidal.

## 2014-11-30 ENCOUNTER — Telehealth: Payer: Self-pay | Admitting: Nurse Practitioner

## 2014-11-30 DIAGNOSIS — Z029 Encounter for administrative examinations, unspecified: Secondary | ICD-10-CM

## 2014-11-30 NOTE — Telephone Encounter (Signed)
Pt said she was away at [facility] this weekend and did not have the pamphlet with her. She said when she got back into town yesterday, is when she read the info.  Pt said she is going to continue taking the med and see if her s/s subside. Pt said she will call us if she decides to quit taking it.

## 2014-11-30 NOTE — Telephone Encounter (Addendum)
Patient was put on Abilify last week and was told to take at the same time every day.  She was taking this in the morning time to help her sleep when she got off work.  She works a swing shift and they are changing her hours for this week and she sleeps at a different time now. She wants to know what she needs to do about the time that she takes this medicine?  Also, she wants [name] to know that she believes she is having the rare side effects that Abilify causes such as tremors in the middle of her chest, motion sickness and headaches, forgetting stuff from one second to next, cant remember.

## 2014-11-30 NOTE — Telephone Encounter (Signed)
Noted. Thanks.

## 2014-11-30 NOTE — Telephone Encounter (Signed)
#  1: try to take medicine about the same time everyday morning or night; swing shift makes it difficult but you want the medicine to stay at about the same dose in the body #2: DON'T READ PACKAGE [employer]; not a good idea when you have anxiety; put pamphlet somewhere and if you have a problem have a family member check list of side effects #3: here is the problem, with the level of anxiety and depression that she is having, how much of those are due to her mental health? Are any really due to Abilify? I have her on the lowest dose. If she thinks she cannot take 2 mg she needs to stop and let us know. Remember we are very limited in our choices based on her prior history. Thanks.

## 2014-12-08 ENCOUNTER — Telehealth: Payer: Self-pay | Admitting: Adult Health

## 2014-12-08 ENCOUNTER — Telehealth: Payer: Self-pay | Admitting: Family Medicine

## 2014-12-08 NOTE — Telephone Encounter (Signed)
Patient was seen by [name] and put on abilify 2mg  one for 6 days that ended 3/2 and started taking 2 every day. but she states she still feel the same . She wants to know should she take 3 or increase the mg. [phone].

## 2014-12-08 NOTE — Telephone Encounter (Signed)
Complains of bleeding and fatigue, make appt

## 2014-12-09 ENCOUNTER — Encounter: Payer: Self-pay | Admitting: Adult Health

## 2014-12-09 ENCOUNTER — Other Ambulatory Visit: Payer: Self-pay | Admitting: *Deleted

## 2014-12-09 ENCOUNTER — Ambulatory Visit (INDEPENDENT_AMBULATORY_CARE_PROVIDER_SITE_OTHER): Payer: BLUE CROSS/BLUE SHIELD | Admitting: Adult Health

## 2014-12-09 VITALS — BP 102/70 | HR 92 | Ht 64.5 in | Wt 138.0 lb

## 2014-12-09 DIAGNOSIS — N939 Abnormal uterine and vaginal bleeding, unspecified: Secondary | ICD-10-CM | POA: Diagnosis not present

## 2014-12-09 HISTORY — DX: Abnormal uterine and vaginal bleeding, unspecified: N93.9

## 2014-12-09 MED ORDER — ARIPIPRAZOLE 5 MG PO TABS: 5.0000 mg | ORAL_TABLET | Freq: Every day | ORAL | Status: DC

## 2014-12-09 NOTE — Progress Notes (Signed)
Subjective:     Patient ID: Destiny Orr, female   DOB: Dec 09, 1980, 34 y.o.   MRN: [id number]  HPI Destiny Orr is a 18 yea rold white female, married in complaining of bleeding and cramping for 2 weeks with depo.She got last shot 11/02/14.She has been on abilify for 14 days now for depression and is working 3rd shift.She is tired but has been like that has had labs with Pearson Forster FNP and they were normal, is seeing Dr Benjamine Mola for thyroid nodule.No new sex partners.Has arthritis and is taking naproxen.  Review of Systems  Patient denies any headaches, hearing loss, blurred vision, shortness of breath, chest pain, abdominal pain, problems with bowel movements, urination, or intercourse. See HPI for positives. Reviewed past medical,surgical, social and family history. Reviewed medications and allergies.     Objective:   Physical Exam BP 102/70 mmHg  Pulse 92  Ht 5' 4.5" (1.638 m)  Wt 138 lb (62.596 kg)  BMI 23.33 kg/m2  LMP 12/06/2014 Skin warm and dry.Pelvic: external genitalia is normal in appearance no lesions, vagina: scnat discharge without odor,urethra has no lesions or masses noted, cervix:smooth and bulbous, uterus: normal size, shape and contour,mildly tender, no masses felt, adnexa: no masses, LLQ tenderness noted. Bladder is non tender and no masses felt.  GC/CHL obtained.     Assessment:     AUB on depo    Plan:     GC/CHL sent Return in 1 week for gyn [place] Review handout on AUB

## 2014-12-09 NOTE — Telephone Encounter (Signed)
This type of message definitely should be routed directly to [place] on days when she is here,

## 2014-12-09 NOTE — Patient Instructions (Signed)
Dysfunctional Uterine Bleeding Normally, menstrual periods begin between ages 11 to 17 in young women. A normal menstrual cycle/period may begin every 23 days up to 35 days and lasts from 1 to 7 days. Around 12 to 14 days before your menstrual period starts, ovulation (ovary produces an egg) occurs. When counting the time between menstrual periods, count from the first day of bleeding of the previous period to the first day of bleeding of the next period. Dysfunctional (abnormal) uterine bleeding is bleeding that is different from a normal menstrual period. Your periods may come earlier or later than usual. They may be lighter, have blood clots or be heavier. You may have bleeding between periods, or you may skip one period or more. You may have bleeding after sexual intercourse, bleeding after menopause, or no menstrual period. CAUSES   Pregnancy (normal, miscarriage, tubal).  IUDs (intrauterine device, birth control).  Birth control pills.  Hormone treatment.  Menopause.  Infection of the cervix.  Blood clotting problems.  Infection of the inside lining of the uterus.  Endometriosis, inside lining of the uterus growing in the pelvis and other female organs.  Adhesions (scar tissue) inside the uterus.  Obesity or severe weight loss.  Uterine polyps inside the uterus.  Cancer of the vagina, cervix, or uterus.  Ovarian cysts or polycystic ovary syndrome.  Medical problems (diabetes, thyroid disease).  Uterine fibroids (noncancerous tumor).  Problems with your female hormones.  Endometrial hyperplasia, very thick lining and enlarged cells inside of the uterus.  Medicines that interfere with ovulation.  Radiation to the pelvis or abdomen.  Chemotherapy. DIAGNOSIS   Your doctor will discuss the history of your menstrual periods, medicines you are taking, changes in your weight, stress in your life, and any medical problems you may have.  Your doctor will do a physical  and pelvic examination.  Your doctor may want to perform certain tests to make a diagnosis, such as:  Pap test.  Blood tests.  Cultures for infection.  CT scan.  Ultrasound.  Hysteroscopy.  Laparoscopy.  MRI.  Hysterosalpingography.  D and C.  Endometrial biopsy. TREATMENT  Treatment will depend on the cause of the dysfunctional uterine bleeding (DUB). Treatment may include:  Observing your menstrual periods for a couple of months.  Prescribing medicines for medical problems, including:  Antibiotics.  Hormones.  Birth control pills.  Removing an IUD (intrauterine device, birth control).  Surgery:  D and C (scrape and remove tissue from inside the uterus).  Laparoscopy (examine inside the abdomen with a lighted tube).  Uterine ablation (destroy lining of the uterus with electrical current, laser, heat, or freezing).  Hysteroscopy (examine cervix and uterus with a lighted tube).  Hysterectomy (remove the uterus). HOME CARE INSTRUCTIONS   If medicines were prescribed, take exactly as directed. Do not change or switch medicines without consulting your caregiver.  Long term heavy bleeding may result in iron deficiency. Your caregiver may have prescribed iron pills. They help replace the iron that your body lost from heavy bleeding. Take exactly as directed.  Do not take aspirin or medicines that contain aspirin one week before or during your menstrual period. Aspirin may make the bleeding worse.  If you need to change your sanitary pad or tampon more than once every 2 hours, stay in bed with your feet elevated and a cold pack on your lower abdomen. Rest as much as possible, until the bleeding stops or slows down.  Eat well-balanced meals. Eat foods high in iron. Examples   are:  Leafy green vegetables.  Whole-grain breads and cereals.  Eggs.  Meat.  Liver.  Do not try to lose weight until the abnormal bleeding has stopped and your blood iron level is  back to normal. Do not lift more than ten pounds or do strenuous activities when you are bleeding.  For a couple of months, make note on your calendar, marking the start and ending of your period, and the type of bleeding (light, medium, heavy, spotting, clots or missed periods). This is for your caregiver to better evaluate your problem. SEEK MEDICAL CARE IF:   You develop nausea (feeling sick to your stomach) and vomiting, dizziness, or diarrhea while you are taking your medicine.  You are getting lightheaded or weak.  You have any problems that may be related to the medicine you are taking.  You develop pain with your DUB.  You want to remove your IUD.  You want to stop or change your birth control pills or hormones.  You have any type of abnormal bleeding mentioned above.  You are over 32 years old and have not had a menstrual period yet.  You are 34 years old and you are still having menstrual periods.  You have any of the symptoms mentioned above.  You develop a rash. SEEK IMMEDIATE MEDICAL CARE IF:   An oral temperature above 102 F (38.9 C) develops.  You develop chills.  You are changing your sanitary pad or tampon more than once an hour.  You develop abdominal pain.  You pass out or faint. Document Released: 09/15/2000 Document Revised: 12/11/2011 Document Reviewed: 08/17/2009 [facility] Patient Information 2015 [place], [place]. This information is not intended to replace advice given to you by your health care provider. Make sure you discuss any questions you have with your health care provider. Return in 1 week for [place]

## 2014-12-09 NOTE — Telephone Encounter (Signed)
Increase to 5mg  one qhs per [name]. Pt notified on voicemail and script sent to [place].

## 2014-12-09 NOTE — Telephone Encounter (Signed)
No adverse effects. Pt states at the beg she was having nausea and headaches but that has eased off. She is currently taking 2mg  2 a day. If you increase she will need a new rx sent to [facility]. She is going out of town Friday.

## 2014-12-09 NOTE — Telephone Encounter (Signed)
Has she had any adverse effects? I see where she has appt with psych on 3/31. We can increase this dose if needed.

## 2014-12-10 ENCOUNTER — Ambulatory Visit (INDEPENDENT_AMBULATORY_CARE_PROVIDER_SITE_OTHER): Payer: BLUE CROSS/BLUE SHIELD | Admitting: Otolaryngology

## 2014-12-10 DIAGNOSIS — D440 Neoplasm of uncertain behavior of thyroid gland: Secondary | ICD-10-CM

## 2014-12-10 DIAGNOSIS — R1312 Dysphagia, oropharyngeal phase: Secondary | ICD-10-CM | POA: Diagnosis not present

## 2014-12-10 LAB — GC/CHLAMYDIA PROBE AMP
Chlamydia trachomatis, NAA: NEGATIVE
NEISSERIA GONORRHOEAE BY PCR: NEGATIVE

## 2014-12-14 ENCOUNTER — Other Ambulatory Visit (INDEPENDENT_AMBULATORY_CARE_PROVIDER_SITE_OTHER): Payer: Self-pay | Admitting: Otolaryngology

## 2014-12-14 DIAGNOSIS — R1310 Dysphagia, unspecified: Secondary | ICD-10-CM

## 2014-12-17 ENCOUNTER — Ambulatory Visit (HOSPITAL_COMMUNITY)
Admission: RE | Admit: 2014-12-17 | Discharge: 2014-12-17 | Disposition: A | Discharge status: Home / Self Care | Payer: BLUE CROSS/BLUE SHIELD | Source: Ambulatory Visit | Attending: Diagnostic Radiology | Admitting: Diagnostic Radiology

## 2014-12-17 ENCOUNTER — Other Ambulatory Visit: Payer: BLUE CROSS/BLUE SHIELD

## 2014-12-17 DIAGNOSIS — R1310 Dysphagia, unspecified: Secondary | ICD-10-CM

## 2014-12-21 ENCOUNTER — Encounter: Payer: Self-pay | Admitting: Obstetrics & Gynecology

## 2014-12-21 ENCOUNTER — Other Ambulatory Visit: Payer: BLUE CROSS/BLUE SHIELD

## 2014-12-29 ENCOUNTER — Other Ambulatory Visit: Payer: Self-pay | Admitting: Nurse Practitioner

## 2014-12-29 ENCOUNTER — Telehealth: Payer: Self-pay | Admitting: Nurse Practitioner

## 2014-12-29 MED ORDER — ARIPIPRAZOLE 5 MG PO TABS: ORAL_TABLET | ORAL | Status: DC

## 2014-12-29 NOTE — Telephone Encounter (Signed)
Yes she needs to be seen in April. I will send in Rx and increase dose slightly. Thanks.

## 2014-12-29 NOTE — Telephone Encounter (Signed)
Patient was put on ARIPiprazole (ABILIFY) 5 MG tablet by [name] and is close to running out.  She wants to know does she need to be seen before she gets this refilled?  Also, if she can get a refill, she would like to see about trying 7 mg?   [name]

## 2014-12-29 NOTE — Telephone Encounter (Signed)
Discussed with patient. Patient scheduled follow up office visit in April with [name].

## 2014-12-31 ENCOUNTER — Encounter (HOSPITAL_COMMUNITY): Payer: Self-pay | Admitting: Psychiatry

## 2014-12-31 ENCOUNTER — Ambulatory Visit (INDEPENDENT_AMBULATORY_CARE_PROVIDER_SITE_OTHER): Payer: BLUE CROSS/BLUE SHIELD | Admitting: Psychiatry

## 2014-12-31 VITALS — BP 102/57 | HR 89 | Ht 64.5 in | Wt 140.6 lb

## 2014-12-31 DIAGNOSIS — F332 Major depressive disorder, recurrent severe without psychotic features: Secondary | ICD-10-CM | POA: Diagnosis not present

## 2014-12-31 DIAGNOSIS — F4310 Post-traumatic stress disorder, unspecified: Secondary | ICD-10-CM | POA: Diagnosis not present

## 2014-12-31 DIAGNOSIS — F411 Generalized anxiety disorder: Secondary | ICD-10-CM

## 2014-12-31 MED ORDER — ALPRAZOLAM 1 MG PO TABS: 1.0000 mg | ORAL_TABLET | Freq: Three times a day (TID) | ORAL | Status: DC

## 2014-12-31 MED ORDER — TRAZODONE HCL 100 MG PO TABS: 100.0000 mg | ORAL_TABLET | Freq: Every day | ORAL | Status: DC
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
## 2015-01-07 ENCOUNTER — Ambulatory Visit (INDEPENDENT_AMBULATORY_CARE_PROVIDER_SITE_OTHER): Payer: Self-pay | Admitting: Otolaryngology

## 2015-01-15 ENCOUNTER — Ambulatory Visit (INDEPENDENT_AMBULATORY_CARE_PROVIDER_SITE_OTHER): Payer: BLUE CROSS/BLUE SHIELD | Admitting: Nurse Practitioner

## 2015-01-15 ENCOUNTER — Encounter: Payer: Self-pay | Admitting: Nurse Practitioner

## 2015-01-15 VITALS — BP 116/68 | Ht 64.5 in | Wt 146.2 lb

## 2015-01-15 DIAGNOSIS — F419 Anxiety disorder, unspecified: Secondary | ICD-10-CM

## 2015-01-15 DIAGNOSIS — F329 Major depressive disorder, single episode, unspecified: Secondary | ICD-10-CM

## 2015-01-15 MED ORDER — ARIPIPRAZOLE 10 MG PO TABS: 10.0000 mg | ORAL_TABLET | Freq: Every day | ORAL | Status: DC

## 2015-01-18 ENCOUNTER — Ambulatory Visit (HOSPITAL_COMMUNITY): Payer: Self-pay | Admitting: Psychiatry

## 2015-01-18 ENCOUNTER — Encounter: Payer: Self-pay | Admitting: Nurse Practitioner

## 2015-01-18 NOTE — Progress Notes (Signed)
Subjective:  Presents for follow-up on Abilify. Currently on 7.5 mg. Recently saw Dr. Harrington Challenger our local psychiatrist. No changes in medication at this point. Was diagnosed with major depressive episode. Patient still questions whether she may have bipolar disorder. Overall her symptoms have improved. Will be starting counseling in the near future.  Objective:   BP 116/68 mmHg  Ht 5' 4.5" (1.638 m)  Wt 146 lb 3.2 oz (66.316 kg)  BMI 24.72 kg/m2 NAD. Alert, oriented. Calm affect. Thoughts logical coherent and relevant. Dressed appropriately. Lungs clear. Heart regular rate rhythm.  Assessment:  Problem List Items Addressed This Visit      Other   Anxiety   Depression - Primary     Plan:  Meds ordered this encounter  Medications  . ARIPiprazole (ABILIFY) 10 MG tablet    Sig: Take 1 tablet (10 mg total) by mouth daily.    Dispense:  30 tablet    Refill:  2    Order Specific Question:  Supervising Provider    Answer:  Mikey Kirschner [[id number]]   Increase Abilify to 10 mg daily at bedtime. Continue follow-up with psychiatrist and mental health counselor. Return in about 3 months (around 04/16/2015).

## 2015-01-19 ENCOUNTER — Encounter: Payer: Self-pay | Admitting: Family Medicine

## 2015-01-19 ENCOUNTER — Ambulatory Visit (INDEPENDENT_AMBULATORY_CARE_PROVIDER_SITE_OTHER): Payer: BLUE CROSS/BLUE SHIELD | Admitting: Nurse Practitioner

## 2015-01-19 ENCOUNTER — Encounter: Payer: Self-pay | Admitting: Nurse Practitioner

## 2015-01-19 VITALS — BP 116/82 | Ht 64.5 in | Wt 146.4 lb

## 2015-01-19 DIAGNOSIS — M5441 Lumbago with sciatica, right side: Secondary | ICD-10-CM | POA: Diagnosis not present

## 2015-01-19 DIAGNOSIS — S39012A Strain of muscle, fascia and tendon of lower back, initial encounter: Secondary | ICD-10-CM | POA: Diagnosis not present

## 2015-01-19 MED ORDER — PREDNISONE 20 MG PO TABS: ORAL_TABLET | ORAL | Status: DC

## 2015-01-19 MED ORDER — CHLORZOXAZONE 500 MG PO TABS: 500.0000 mg | ORAL_TABLET | Freq: Three times a day (TID) | ORAL | Status: DC | PRN

## 2015-01-19 MED ORDER — HYDROCODONE-ACETAMINOPHEN 5-325 MG PO TABS: 1.0000 | ORAL_TABLET | ORAL | Status: DC | PRN

## 2015-01-19 NOTE — Patient Instructions (Signed)

## 2015-01-20 ENCOUNTER — Encounter: Payer: Self-pay | Admitting: Nurse Practitioner

## 2015-01-20 NOTE — Progress Notes (Signed)
Subjective:  Presents for c/o right low back pain that began yesterday while picking up a 50 lb bag of dog food. Pain goes from right low back area in to right anterior thigh. No abd pain. No change in bowel or bladder habits.   Objective:   BP 116/82 mmHg  Ht 5' 4.5" (1.638 m)  Wt 146 lb 6.4 oz (66.407 kg)  BMI 24.75 kg/m2 NAD. Alert, oriented. Pain with palpation right lumbar area. SLR neg left, pos right. Reflexes normal lower extremities. Gait slow but steady.   Assessment:  Right-sided low back pain with right-sided sciatica  Back strain, initial encounter   Plan:  Meds ordered this encounter  Medications  . predniSONE (DELTASONE) 20 MG tablet    Sig: 3 po qd x 3 d then 2 po qd x 3 d then 1 po qd x 3 d    Dispense:  18 tablet    Refill:  0    Order Specific Question:  Supervising Provider    Answer:  Mikey Kirschner [[id number]]  . HYDROcodone-acetaminophen (NORCO/VICODIN) 5-325 MG per tablet    Sig: Take 1 tablet by mouth every 4 (four) hours as needed.    Dispense:  20 tablet    Refill:  0    Order Specific Question:  Supervising Provider    Answer:  Mikey Kirschner [[id number]]  . chlorzoxazone (PARAFON) 500 MG tablet    Sig: Take 1 tablet (500 mg total) by mouth 3 (three) times daily as needed for muscle spasms.    Dispense:  30 tablet    Refill:  0    Order Specific Question:  Supervising Provider    Answer:  Maggie Font   Short course of pain medication. Drowsiness precautions. Do not take muscle relaxant or pain med with Xanax. Ice/heat applications, back exercises and OTC TENS unit. appt with rheumatologist later this week.  Call back next week if no improvement in back pain.

## 2015-01-21 ENCOUNTER — Encounter: Payer: Self-pay | Admitting: Nurse Practitioner

## 2015-01-25 ENCOUNTER — Ambulatory Visit: Payer: BLUE CROSS/BLUE SHIELD

## 2015-01-25 ENCOUNTER — Ambulatory Visit (INDEPENDENT_AMBULATORY_CARE_PROVIDER_SITE_OTHER): Payer: BLUE CROSS/BLUE SHIELD | Admitting: *Deleted

## 2015-01-25 ENCOUNTER — Encounter: Payer: Self-pay | Admitting: *Deleted

## 2015-01-25 DIAGNOSIS — Z3042 Encounter for surveillance of injectable contraceptive: Secondary | ICD-10-CM | POA: Diagnosis not present

## 2015-01-25 DIAGNOSIS — Z3202 Encounter for pregnancy test, result negative: Secondary | ICD-10-CM | POA: Diagnosis not present

## 2015-01-25 LAB — POCT URINE PREGNANCY: PREG TEST UR: NEGATIVE

## 2015-01-25 MED ORDER — MEDROXYPROGESTERONE ACETATE 150 MG/ML IM SUSP
150.0000 mg | Freq: Once | INTRAMUSCULAR | Status: AC
  Administered 2015-01-25: 150 mg via INTRAMUSCULAR

## 2015-01-25 NOTE — Progress Notes (Signed)
Pt here for Depo. Reports no problems at this time. Return in 12 weeks for next shot. [name] 

## 2015-01-27 ENCOUNTER — Telehealth: Payer: Self-pay | Admitting: Family Medicine

## 2015-01-27 NOTE — Telephone Encounter (Signed)
[name] do you have any information from the rheumatologist on this patient?

## 2015-01-27 NOTE — Telephone Encounter (Signed)
Pt would like to know what the rheumatologist said. Pt states they sent Over a note to the Dr. On it. Would like a nurse to call her back and explain It to her.

## 2015-01-28 ENCOUNTER — Ambulatory Visit (INDEPENDENT_AMBULATORY_CARE_PROVIDER_SITE_OTHER): Payer: BLUE CROSS/BLUE SHIELD | Admitting: Psychiatry

## 2015-01-28 ENCOUNTER — Encounter (HOSPITAL_COMMUNITY): Payer: Self-pay | Admitting: Psychiatry

## 2015-01-28 VITALS — BP 120/69 | Ht 64.5 in | Wt 143.6 lb

## 2015-01-28 DIAGNOSIS — F332 Major depressive disorder, recurrent severe without psychotic features: Secondary | ICD-10-CM | POA: Diagnosis not present

## 2015-01-28 DIAGNOSIS — F411 Generalized anxiety disorder: Secondary | ICD-10-CM

## 2015-01-28 DIAGNOSIS — F4310 Post-traumatic stress disorder, unspecified: Secondary | ICD-10-CM

## 2015-01-28 MED ORDER — GABAPENTIN 300 MG PO CAPS: ORAL_CAPSULE | ORAL | Status: DC

## 2015-01-28 NOTE — Telephone Encounter (Signed)
Discussed with patient. Patient verbalized understanding. 
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
## 2015-01-28 NOTE — Telephone Encounter (Signed)
Note on your desk.

## 2015-01-28 NOTE — Telephone Encounter (Signed)
Nurses: please read assessment and plan from Dr. Elmon Else notes on 4/21 to patient. Thanks.

## 2015-01-28 NOTE — Telephone Encounter (Signed)
I have not had anything come across my desk. Not sure who she saw. Her last referral was to Dr. Charlestine Night according to electronic chart. There may be something on doctor's desk.

## 2015-02-09 ENCOUNTER — Ambulatory Visit (HOSPITAL_COMMUNITY)
Admission: RE | Admit: 2015-02-09 | Discharge: 2015-02-09 | Disposition: A | Discharge status: Home / Self Care | Payer: BLUE CROSS/BLUE SHIELD | Source: Ambulatory Visit | Attending: Family Medicine | Admitting: Family Medicine

## 2015-02-09 ENCOUNTER — Encounter: Payer: Self-pay | Admitting: Family Medicine

## 2015-02-09 ENCOUNTER — Ambulatory Visit (INDEPENDENT_AMBULATORY_CARE_PROVIDER_SITE_OTHER): Payer: BLUE CROSS/BLUE SHIELD | Admitting: Family Medicine

## 2015-02-09 ENCOUNTER — Telehealth: Payer: Self-pay | Admitting: Family Medicine

## 2015-02-09 VITALS — BP 135/82 | Temp 98.9°F | Ht 64.5 in | Wt 145.2 lb

## 2015-02-09 DIAGNOSIS — R937 Abnormal findings on diagnostic imaging of other parts of musculoskeletal system: Secondary | ICD-10-CM | POA: Insufficient documentation

## 2015-02-09 DIAGNOSIS — M79604 Pain in right leg: Secondary | ICD-10-CM

## 2015-02-09 DIAGNOSIS — M79671 Pain in right foot: Secondary | ICD-10-CM | POA: Insufficient documentation

## 2015-02-09 DIAGNOSIS — W19XXXA Unspecified fall, initial encounter: Secondary | ICD-10-CM | POA: Insufficient documentation

## 2015-02-09 DIAGNOSIS — M25571 Pain in right ankle and joints of right foot: Secondary | ICD-10-CM

## 2015-02-09 DIAGNOSIS — S99821A Other specified injuries of right foot, initial encounter: Secondary | ICD-10-CM | POA: Insufficient documentation

## 2015-02-09 DIAGNOSIS — M7989 Other specified soft tissue disorders: Secondary | ICD-10-CM | POA: Insufficient documentation

## 2015-02-09 MED ORDER — HYDROCODONE-ACETAMINOPHEN 5-325 MG PO TABS: 1.0000 | ORAL_TABLET | Freq: Four times a day (QID) | ORAL | Status: DC | PRN

## 2015-02-09 MED ORDER — PREDNISONE 20 MG PO TABS: ORAL_TABLET | ORAL | Status: DC

## 2015-02-09 NOTE — Telephone Encounter (Signed)
Pt would like to go forward with the xray at this point Got home an thought about it an decided it would be a good idea

## 2015-02-09 NOTE — Telephone Encounter (Signed)
Order for right foot x-ray placed in Epic. Patient notified.

## 2015-02-09 NOTE — Telephone Encounter (Signed)
Xray foot, right

## 2015-02-09 NOTE — Progress Notes (Signed)
   Subjective:    Patient ID: Destiny Orr, female    DOB: 26-Jan-1981, 34 y.o.   MRN: [id number]  Foot Pain This is a new problem. The current episode started yesterday. The problem occurs constantly. The problem has been unchanged. The symptoms are aggravated by walking. She has tried NSAIDs (Hydrocodone) for the symptoms. The treatment provided no relief.   Patient jammed the end of her foot while on slippers. Her foot dorsiflexed severely.   Now complaining of dorsal pain and swelling. Walking with a limp. This occurred 2 days ago.   Review of Systems No ankle pain no knee pain    Objective:   Physical Exam  Alert vital stable anxious lungs clear heart rare rhythm right dorsal foot diffuse tenderness and distinct swelling      Assessment & Plan:  Impression probable T no synovitis secondary to tendon strain discussed plan patient thought about it would like x-ray. Prednisone taper. Hydrocodone prescribed. WSL

## 2015-02-10 ENCOUNTER — Encounter: Payer: Self-pay | Admitting: Orthopedic Surgery

## 2015-02-10 ENCOUNTER — Ambulatory Visit (INDEPENDENT_AMBULATORY_CARE_PROVIDER_SITE_OTHER): Payer: BLUE CROSS/BLUE SHIELD | Admitting: Orthopedic Surgery

## 2015-02-10 VITALS — BP 118/80 | Ht 64.5 in | Wt 145.2 lb

## 2015-02-10 DIAGNOSIS — S92301A Fracture of unspecified metatarsal bone(s), right foot, initial encounter for closed fracture: Secondary | ICD-10-CM

## 2015-02-10 MED ORDER — HYDROCODONE-ACETAMINOPHEN 5-325 MG PO TABS: 1.0000 | ORAL_TABLET | Freq: Four times a day (QID) | ORAL | Status: DC | PRN

## 2015-02-10 MED ORDER — IBUPROFEN 800 MG PO TABS: 800.0000 mg | ORAL_TABLET | Freq: Three times a day (TID) | ORAL | Status: DC | PRN

## 2015-02-10 NOTE — Patient Instructions (Signed)
Out of work 4 weeks  Weight bearing in boot as tolerated Remove brace to sleep and for bathing

## 2015-02-10 NOTE — Progress Notes (Signed)
Patient ID: Destiny Orr, female   DOB: Jan 08, 1981, 34 y.o.   MRN: [id number]  Chief Complaint  Patient presents with  . Foot Injury    right foot fx, DOI 02/09/15, REF [facility]     Destiny Orr is a 34 y.o. female.   HPI Patient fell on May 10 at her home tripping over her foot flop. Presented to doctor's office complained of pain was sent for x-rays. The x-ray report came back as fracture and the patient was referred to [place] for evaluation and treatment in review of the x-ray and clinical findings  She complains of sharp constant dull and aching pain over the base of the fifth medical tarsal. She is not able to weight-bear Review of Systems Pertinent review of systems no fever or chills no numbness or tingling skin changes or bruising.  Past Medical History  Diagnosis Date  . Anxiety   . Depression   . Pneumothorax, spontaneous, tension   . PTSD (post-traumatic stress disorder)   . Arthritis   . DJD (degenerative joint disease)   . Scoliosis   . Fatigue 12/24/2012  . Hx of migraine headaches 12/24/2012  . Dysmenorrhea 07/15/2014  . Irregular menstrual bleeding 07/15/2014  . Abnormal uterine bleeding (AUB) 12/09/2014  . Headache   . Thyroid disease     Past Surgical History  Procedure Laterality Date  . Knee surgery    . Cesarean section    . Bone spurs toes Right     Family History  Problem Relation Age of Onset  . Hypertension Father   . Atrial fibrillation Father   . Alcohol abuse Father   . Cancer Maternal Grandmother     skin   . Heart disease Maternal Grandmother   . Other Maternal Grandmother     had thyroid removed  . Heart disease Paternal Grandfather   . COPD Paternal Grandfather   . Hypertension Paternal Grandfather   . Diabetes Paternal Grandfather   . Stroke Paternal Grandfather   . Anxiety disorder Mother   . Anxiety disorder Maternal Aunt   . Anxiety disorder Maternal Uncle   . Bipolar disorder Maternal Uncle     Social History History  Substance  Use Topics  . Smoking status: Current Every Day Smoker -- 1.50 packs/day for 15 years    Types: Cigarettes    Start date: 03/13/1997  . Smokeless tobacco: Never Used     Comment: "working on it" Was smoking 2.5 packs a day  . Alcohol Use: Yes     Comment: occ. mixed drink once or twice a year     Allergies  Allergen Reactions  . Lexapro [Escitalopram Oxalate] Other (See Comments)    Flat affect, No emotions    Current Outpatient Prescriptions  Medication Sig Dispense Refill  . ALPRAZolam (XANAX) 1 MG tablet Take 1 tablet (1 mg total) by mouth 3 (three) times daily. 90 tablet 2  . ARIPiprazole (ABILIFY) 10 MG tablet Take 1 tablet (10 mg total) by mouth daily. 30 tablet 2  . chlorzoxazone (PARAFON) 500 MG tablet Take 1 tablet (500 mg total) by mouth 3 (three) times daily as needed for muscle spasms. 30 tablet 0  . Cyanocobalamin (VITAMIN B 12 PO) Take by mouth daily.    Marland Kitchen HYDROcodone-acetaminophen (NORCO/VICODIN) 5-325 MG per tablet Take 1 tablet by mouth every 6 (six) hours as needed. 60 tablet 0  . ibuprofen (ADVIL,MOTRIN) 200 MG tablet Take 400 mg by mouth every 6 (six) hours as needed. Pain    .  medroxyPROGESTERone (DEPO-PROVERA) 150 MG/ML injection Inject 1 mL (150 mg total) into the muscle every 3 (three) months. 1 mL 4  . predniSONE (DELTASONE) 20 MG tablet Take 3 tabs for 3 days, then 2 for 3 days then 1 for 2 days 17 tablet 0  . ibuprofen (ADVIL,MOTRIN) 800 MG tablet Take 1 tablet (800 mg total) by mouth every 8 (eight) hours as needed. 90 tablet 5   No current facility-administered medications for this visit.       Physical Exam Blood pressure 118/80, height 5' 4.5" (1.638 m), weight 145 lb 4 oz (65.885 kg). Physical Exam The patient is well developed well nourished and well groomed. Orientation to person place and time is normal  Mood is pleasant. Ambulatory status antalgic weightbearing gait avoidance gait Right foot tenderness at the base of the fifth metacarpal  tarsal painless range of motion of the ankle, ankle stability normal. Muscle tone normal. Skin discolored. Pulse normal. Sensation normal.  Data Reviewed I interpret her x-ray as questionable fracture fifth metatarsal  Assessment Pain right foot, possible fracture Plan Short Cam Walker weightbearing as tolerated out of work 4 weeks x-ray 4 weeks Meds ordered this encounter  Medications  . HYDROcodone-acetaminophen (NORCO/VICODIN) 5-325 MG per tablet    Sig: Take 1 tablet by mouth every 6 (six) hours as needed.    Dispense:  60 tablet    Refill:  0  . ibuprofen (ADVIL,MOTRIN) 800 MG tablet    Sig: Take 1 tablet (800 mg total) by mouth every 8 (eight) hours as needed.    Dispense:  90 tablet    Refill:  5

## 2015-02-10 NOTE — Addendum Note (Signed)
Addended by: Dairl Ponder on: 02/10/2015 10:28 AM   Modules accepted: Orders

## 2015-02-18 ENCOUNTER — Encounter (HOSPITAL_COMMUNITY): Payer: Self-pay | Admitting: Psychiatry

## 2015-02-18 ENCOUNTER — Ambulatory Visit (INDEPENDENT_AMBULATORY_CARE_PROVIDER_SITE_OTHER): Payer: BLUE CROSS/BLUE SHIELD | Admitting: Psychiatry

## 2015-02-18 DIAGNOSIS — F332 Major depressive disorder, recurrent severe without psychotic features: Secondary | ICD-10-CM | POA: Diagnosis not present

## 2015-02-18 NOTE — Patient Instructions (Signed)
Discussed orally 
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
## 2015-02-25 ENCOUNTER — Ambulatory Visit (INDEPENDENT_AMBULATORY_CARE_PROVIDER_SITE_OTHER): Payer: BLUE CROSS/BLUE SHIELD | Admitting: Psychiatry

## 2015-02-25 ENCOUNTER — Encounter (HOSPITAL_COMMUNITY): Payer: Self-pay | Admitting: Psychiatry

## 2015-02-25 ENCOUNTER — Telehealth (HOSPITAL_COMMUNITY): Payer: Self-pay | Admitting: *Deleted

## 2015-02-25 VITALS — BP 119/87 | HR 103 | Ht 64.5 in | Wt 149.6 lb

## 2015-02-25 DIAGNOSIS — F332 Major depressive disorder, recurrent severe without psychotic features: Secondary | ICD-10-CM | POA: Diagnosis not present

## 2015-02-25 DIAGNOSIS — F411 Generalized anxiety disorder: Secondary | ICD-10-CM

## 2015-02-25 DIAGNOSIS — F4310 Post-traumatic stress disorder, unspecified: Secondary | ICD-10-CM

## 2015-02-25 MED ORDER — ARIPIPRAZOLE 10 MG PO TABS: 10.0000 mg | ORAL_TABLET | Freq: Every day | ORAL | Status: DC

## 2015-02-25 MED ORDER — ALPRAZOLAM 1 MG PO TABS: 1.0000 mg | ORAL_TABLET | Freq: Three times a day (TID) | ORAL | Status: DC

## 2015-02-25 MED ORDER — VILAZODONE HCL 40 MG PO TABS: 40.0000 mg | ORAL_TABLET | Freq: Every day | ORAL | Status: DC

## 2015-02-25 MED ORDER — LISDEXAMFETAMINE DIMESYLATE 30 MG PO CAPS: 30.0000 mg | ORAL_CAPSULE | ORAL | Status: DC

## 2015-02-25 NOTE — Telephone Encounter (Signed)
Pt came into office to pick up Discount card for Vyvnase that will give her 30 days free until Prior Auth with her insurance can be completed. Pt D/L number is [phone].
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
## 2015-03-02 ENCOUNTER — Telehealth (HOSPITAL_COMMUNITY): Payer: Self-pay | Admitting: *Deleted

## 2015-03-04 ENCOUNTER — Telehealth (HOSPITAL_COMMUNITY): Payer: Self-pay | Admitting: *Deleted

## 2015-03-04 NOTE — Telephone Encounter (Signed)
Prior authorization received for Vyvanse. Was going to do through cover my meds and noticed patient does not have a diagnosis for ADHD. Will clarify with MD before starting authorization which diagnosis she would like to use in hope that a denial does not occur. Did see that patient has ADHD symptoms, however a diagnosis must be put in online for determination.

## 2015-03-09 ENCOUNTER — Telehealth (HOSPITAL_COMMUNITY): Payer: Self-pay | Admitting: *Deleted

## 2015-03-09 NOTE — Telephone Encounter (Signed)
Prior authorization for Vyvanse completed online with cover my meds.

## 2015-03-09 NOTE — Telephone Encounter (Signed)
You can add the dx of ADHD

## 2015-03-11 ENCOUNTER — Ambulatory Visit: Payer: BLUE CROSS/BLUE SHIELD | Admitting: Orthopedic Surgery

## 2015-03-11 ENCOUNTER — Encounter: Payer: Self-pay | Admitting: Orthopedic Surgery

## 2015-03-12 ENCOUNTER — Telehealth (HOSPITAL_COMMUNITY): Payer: Self-pay | Admitting: *Deleted

## 2015-03-15 ENCOUNTER — Telehealth (HOSPITAL_COMMUNITY): Payer: Self-pay | Admitting: *Deleted

## 2015-03-15 NOTE — Telephone Encounter (Signed)
Prior authorization for Vyvanse was denied stating that patient must try methylphenidate product such as concerta, metadate, methylin, or ritalin first. Will clarify if MD would like an appeal and if more information is available for the appeal process.

## 2015-03-16 ENCOUNTER — Ambulatory Visit (HOSPITAL_COMMUNITY): Payer: Self-pay | Admitting: Psychiatry

## 2015-03-16 NOTE — Telephone Encounter (Signed)
You can try. She has long shifts at work and Vyvanse is the longest acting stimulant

## 2015-03-19 ENCOUNTER — Telehealth (HOSPITAL_COMMUNITY): Payer: Self-pay | Admitting: *Deleted

## 2015-03-19 ENCOUNTER — Other Ambulatory Visit (HOSPITAL_COMMUNITY): Payer: Self-pay | Admitting: Psychiatry

## 2015-03-19 MED ORDER — AMPHETAMINE-DEXTROAMPHET ER 20 MG PO CP24: 20.0000 mg | ORAL_CAPSULE | Freq: Every day | ORAL | Status: DC

## 2015-03-19 NOTE — Telephone Encounter (Signed)
Pt is aware and stated that she will try to make it today before office close and if not she will pick it up on Monday 03-22-15

## 2015-03-19 NOTE — Telephone Encounter (Signed)
Pt came into office to pick up her printed script. Pt agreed with script. Pt d/l number is [phone]

## 2015-03-19 NOTE — Telephone Encounter (Signed)
Per pts chart, her Vyvanse was denied. Pt is aware and would like to know if Dr. Harrington Challenger could try her on something else because she just started a new job. Pt number is [phone].

## 2015-03-19 NOTE — Telephone Encounter (Signed)
adderall xr printed

## 2015-03-22 ENCOUNTER — Telehealth (HOSPITAL_COMMUNITY): Payer: Self-pay | Admitting: *Deleted

## 2015-03-22 NOTE — Telephone Encounter (Signed)
Pt called stating that after Day 3-4, she's getting really tired on later part of the day and can not focus after 3:30pm. Per pt, it's helping her the first half of the day. Pt would like to know if this is normal. Pt number is [phone]

## 2015-03-23 ENCOUNTER — Telehealth (HOSPITAL_COMMUNITY): Payer: Self-pay | Admitting: *Deleted

## 2015-03-23 NOTE — Telephone Encounter (Signed)
Pt informed and showed understanding

## 2015-03-23 NOTE — Telephone Encounter (Signed)
Expand All Collapse All   Pt called stating that after Day 3-4, she's getting really tired on later part of the day and can not focus after 3:30pm. Per pt, it's helping her the first half of the day. Pt would like to know if this is normal. Pt number is [phone]       Per pt, she have been irritable and moody and fell a sleep during work and after.

## 2015-03-23 NOTE — Telephone Encounter (Signed)
Pt called stating that after Day 3-4, she's getting really tired on later part of the day and can not focus after 3:30pm. Per pt, it's helping her the first half of the day. Pt would like to know if this is normal. Pt number is [phone]. Pt wondering if she should take medication later  When she goes into work.

## 2015-03-23 NOTE — Telephone Encounter (Signed)
Samples and coupons provided

## 2015-03-23 NOTE — Telephone Encounter (Signed)
It is normal and dose will probably need adjustment.It can take up to 90 days to get used to a stimulant. We will discuss at next visit

## 2015-03-25 ENCOUNTER — Ambulatory Visit (HOSPITAL_COMMUNITY): Payer: Self-pay | Admitting: Psychiatry

## 2015-03-31 ENCOUNTER — Ambulatory Visit (HOSPITAL_COMMUNITY): Payer: Self-pay | Admitting: Psychiatry

## 2015-04-02 ENCOUNTER — Ambulatory Visit (HOSPITAL_COMMUNITY): Payer: Self-pay | Admitting: Psychiatry

## 2015-04-08 ENCOUNTER — Encounter (HOSPITAL_COMMUNITY): Payer: Self-pay | Admitting: Psychiatry

## 2015-04-08 ENCOUNTER — Ambulatory Visit (INDEPENDENT_AMBULATORY_CARE_PROVIDER_SITE_OTHER): Payer: BLUE CROSS/BLUE SHIELD | Admitting: Psychiatry

## 2015-04-08 VITALS — BP 115/83 | HR 95 | Ht 64.5 in | Wt 153.0 lb

## 2015-04-08 DIAGNOSIS — F411 Generalized anxiety disorder: Secondary | ICD-10-CM

## 2015-04-08 DIAGNOSIS — F332 Major depressive disorder, recurrent severe without psychotic features: Secondary | ICD-10-CM

## 2015-04-08 DIAGNOSIS — F4310 Post-traumatic stress disorder, unspecified: Secondary | ICD-10-CM

## 2015-04-08 MED ORDER — VILAZODONE HCL 40 MG PO TABS: 40.0000 mg | ORAL_TABLET | Freq: Every day | ORAL | Status: DC

## 2015-04-08 MED ORDER — AMPHETAMINE-DEXTROAMPHETAMINE 20 MG PO TABS: 20.0000 mg | ORAL_TABLET | Freq: Two times a day (BID) | ORAL | Status: DC

## 2015-04-08 MED ORDER — ALPRAZOLAM 1 MG PO TABS: 1.0000 mg | ORAL_TABLET | Freq: Three times a day (TID) | ORAL | Status: DC

## 2015-04-08 MED ORDER — ARIPIPRAZOLE 10 MG PO TABS: 10.0000 mg | ORAL_TABLET | Freq: Every day | ORAL | Status: DC
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
## 2015-04-13 ENCOUNTER — Telehealth (HOSPITAL_COMMUNITY): Payer: Self-pay | Admitting: *Deleted

## 2015-04-13 NOTE — Telephone Encounter (Signed)
FAX PA FOR AMPHETAMINE-DEXTROAMPHETAMINE 20 MG.

## 2015-04-13 NOTE — Telephone Encounter (Signed)
Prior authorization for Adderall received. Submitted online with cover my meds. Awaiting decision.

## 2015-04-14 ENCOUNTER — Ambulatory Visit: Payer: BLUE CROSS/BLUE SHIELD | Admitting: Nurse Practitioner

## 2015-04-19 ENCOUNTER — Ambulatory Visit: Payer: BLUE CROSS/BLUE SHIELD

## 2015-04-20 ENCOUNTER — Encounter: Payer: Self-pay | Admitting: *Deleted

## 2015-04-20 ENCOUNTER — Ambulatory Visit: Payer: Self-pay

## 2015-04-20 ENCOUNTER — Ambulatory Visit (INDEPENDENT_AMBULATORY_CARE_PROVIDER_SITE_OTHER): Payer: 59 | Admitting: *Deleted

## 2015-04-20 DIAGNOSIS — Z3202 Encounter for pregnancy test, result negative: Secondary | ICD-10-CM

## 2015-04-20 DIAGNOSIS — Z3042 Encounter for surveillance of injectable contraceptive: Secondary | ICD-10-CM | POA: Diagnosis not present

## 2015-04-20 LAB — POCT URINE PREGNANCY: Preg Test, Ur: NEGATIVE

## 2015-04-20 MED ORDER — MEDROXYPROGESTERONE ACETATE 150 MG/ML IM SUSP
150.0000 mg | Freq: Once | INTRAMUSCULAR | Status: AC
  Administered 2015-04-20: 150 mg via INTRAMUSCULAR

## 2015-04-20 NOTE — Progress Notes (Signed)
Pt here for Depo. Reports no problems at this time. Return in 12 weeks for next shot. [name] 

## 2015-04-21 ENCOUNTER — Telehealth (HOSPITAL_COMMUNITY): Payer: Self-pay | Admitting: *Deleted

## 2015-04-21 ENCOUNTER — Ambulatory Visit: Payer: Self-pay

## 2015-04-21 ENCOUNTER — Ambulatory Visit (HOSPITAL_COMMUNITY): Payer: Self-pay | Admitting: Psychiatry

## 2015-04-21 NOTE — Telephone Encounter (Signed)
phone call from patient.    [facility] for her insurance.  she ask about program for free medications & the total for Abilify and Viibryd are $179.    Adderall need prior authorization.   Please call, can leave message on voice mail.

## 2015-04-22 ENCOUNTER — Telehealth (HOSPITAL_COMMUNITY): Payer: Self-pay | Admitting: *Deleted

## 2015-04-22 NOTE — Telephone Encounter (Signed)
phone call from patient please call her regarding message left for you on yesterday regarding her medications.

## 2015-04-23 ENCOUNTER — Telehealth (HOSPITAL_COMMUNITY): Payer: Self-pay | Admitting: *Deleted

## 2015-04-23 NOTE — Telephone Encounter (Signed)
lmtcb number provided 

## 2015-04-23 NOTE — Telephone Encounter (Signed)
abilify is generic no coupons. We have already tried coupons for viibryd, she can come in for viibryd samples. Alert Deatra Canter about authorization for adderall

## 2015-04-23 NOTE — Telephone Encounter (Signed)
Pt called stating that she have been having a hard time at work. Per pt she have not cared for much lately due to her not being able to take her medications. She stated that she went to her pharmacy and they stated that after her insurance paid, she her totally for both Viirbryd and Abilify with be about $149 a month. Per pt, her insurance is not good since she started this new job and would like to know what to do when it comes to her medications. Pt states she do not know what else to do now and mentioned about going on patient assistant for [employer] and did not know if there was any assistant for Abilify. Informed pt that message will be sent to Dr. Harrington Challenger. Pt number is [phone].

## 2015-04-23 NOTE — Telephone Encounter (Signed)
Pt aware of samples and stated she ill come by office to get samples.

## 2015-04-23 NOTE — Telephone Encounter (Signed)
Pt is aware and shows understanding 

## 2015-04-23 NOTE — Telephone Encounter (Signed)
Had to resubmit request for Adderall through cover my meds. Was told the other request had expired. Called support line to get another access key. Submitted once again by paper copy that was electronically faxed. Should have response in 72 hours.

## 2015-04-23 NOTE — Telephone Encounter (Signed)
Spoke with pt

## 2015-04-23 NOTE — Telephone Encounter (Signed)
Pt came into office to pick up Viibryd samples. Pt D/L number is [phone]. LOt number is [id number]. 3 samples was given.

## 2015-05-04 ENCOUNTER — Telehealth (HOSPITAL_COMMUNITY): Payer: Self-pay | Admitting: *Deleted

## 2015-05-04 NOTE — Telephone Encounter (Signed)
Had to resubmit prior authorization for Adderall with cover my meds due to them sending questions back online that did not get a response. Was told that whenever a decision has not been faxed within 2 days to always check back online to see if more questions have been sent.

## 2015-06-08 ENCOUNTER — Encounter (HOSPITAL_COMMUNITY): Payer: Self-pay | Admitting: Psychiatry

## 2015-06-08 ENCOUNTER — Ambulatory Visit (INDEPENDENT_AMBULATORY_CARE_PROVIDER_SITE_OTHER): Payer: 59 | Admitting: Psychiatry

## 2015-06-08 VITALS — BP 105/83 | HR 113 | Ht 64.5 in | Wt 145.4 lb

## 2015-06-08 DIAGNOSIS — F332 Major depressive disorder, recurrent severe without psychotic features: Secondary | ICD-10-CM | POA: Diagnosis not present

## 2015-06-08 DIAGNOSIS — F4310 Post-traumatic stress disorder, unspecified: Secondary | ICD-10-CM

## 2015-06-08 DIAGNOSIS — F411 Generalized anxiety disorder: Secondary | ICD-10-CM

## 2015-06-08 MED ORDER — AMPHETAMINE-DEXTROAMPHETAMINE 30 MG PO TABS: 30.0000 mg | ORAL_TABLET | Freq: Two times a day (BID) | ORAL | Status: DC

## 2015-06-08 MED ORDER — ALPRAZOLAM 1 MG PO TABS: 1.0000 mg | ORAL_TABLET | Freq: Three times a day (TID) | ORAL | Status: DC
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
## 2015-06-09 ENCOUNTER — Ambulatory Visit (HOSPITAL_COMMUNITY): Payer: Self-pay | Admitting: Psychiatry

## 2015-07-01 ENCOUNTER — Ambulatory Visit (HOSPITAL_COMMUNITY): Payer: Self-pay | Admitting: Psychiatry

## 2015-07-06 ENCOUNTER — Ambulatory Visit (INDEPENDENT_AMBULATORY_CARE_PROVIDER_SITE_OTHER): Payer: 59 | Admitting: Psychiatry

## 2015-07-06 ENCOUNTER — Encounter (HOSPITAL_COMMUNITY): Payer: Self-pay | Admitting: Psychiatry

## 2015-07-06 VITALS — BP 134/97 | HR 115 | Ht 64.5 in | Wt 146.4 lb

## 2015-07-06 DIAGNOSIS — F4310 Post-traumatic stress disorder, unspecified: Secondary | ICD-10-CM | POA: Diagnosis not present

## 2015-07-06 DIAGNOSIS — F332 Major depressive disorder, recurrent severe without psychotic features: Secondary | ICD-10-CM | POA: Diagnosis not present

## 2015-07-06 DIAGNOSIS — F411 Generalized anxiety disorder: Secondary | ICD-10-CM

## 2015-07-06 MED ORDER — ALPRAZOLAM 1 MG PO TABS: 1.0000 mg | ORAL_TABLET | Freq: Three times a day (TID) | ORAL | Status: DC

## 2015-07-06 MED ORDER — AMPHETAMINE-DEXTROAMPHETAMINE 30 MG PO TABS: 30.0000 mg | ORAL_TABLET | Freq: Two times a day (BID) | ORAL | Status: DC

## 2015-07-06 MED ORDER — DULOXETINE HCL 60 MG PO CPEP: 60.0000 mg | ORAL_CAPSULE | Freq: Every day | ORAL | Status: DC
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
## 2015-07-13 ENCOUNTER — Encounter: Payer: Self-pay | Admitting: *Deleted

## 2015-07-13 ENCOUNTER — Other Ambulatory Visit: Payer: Self-pay | Admitting: Adult Health

## 2015-07-13 ENCOUNTER — Ambulatory Visit (INDEPENDENT_AMBULATORY_CARE_PROVIDER_SITE_OTHER): Payer: 59 | Admitting: *Deleted

## 2015-07-13 DIAGNOSIS — Z3202 Encounter for pregnancy test, result negative: Secondary | ICD-10-CM

## 2015-07-13 DIAGNOSIS — Z3042 Encounter for surveillance of injectable contraceptive: Secondary | ICD-10-CM | POA: Diagnosis not present

## 2015-07-13 LAB — POCT URINE PREGNANCY: Preg Test, Ur: NEGATIVE

## 2015-07-13 MED ORDER — MEDROXYPROGESTERONE ACETATE 150 MG/ML IM SUSP
150.0000 mg | Freq: Once | INTRAMUSCULAR | Status: AC
  Administered 2015-07-13: 150 mg via INTRAMUSCULAR

## 2015-07-13 NOTE — Progress Notes (Signed)
Pt here for Depo. Reports no problems at this time. Return in 12 weeks for next shot. [name] 

## 2015-07-19 ENCOUNTER — Ambulatory Visit (HOSPITAL_COMMUNITY): Payer: Self-pay | Admitting: Psychiatry

## 2015-08-04 ENCOUNTER — Encounter (HOSPITAL_COMMUNITY): Payer: Self-pay | Admitting: Psychiatry

## 2015-08-04 ENCOUNTER — Ambulatory Visit (INDEPENDENT_AMBULATORY_CARE_PROVIDER_SITE_OTHER): Payer: 59 | Admitting: Psychiatry

## 2015-08-04 VITALS — BP 138/101 | HR 127 | Ht 64.5 in | Wt 141.6 lb

## 2015-08-04 DIAGNOSIS — F332 Major depressive disorder, recurrent severe without psychotic features: Secondary | ICD-10-CM

## 2015-08-04 DIAGNOSIS — F4310 Post-traumatic stress disorder, unspecified: Secondary | ICD-10-CM

## 2015-08-04 DIAGNOSIS — F411 Generalized anxiety disorder: Secondary | ICD-10-CM | POA: Diagnosis not present

## 2015-08-04 MED ORDER — AMPHETAMINE-DEXTROAMPHETAMINE 30 MG PO TABS: 30.0000 mg | ORAL_TABLET | Freq: Two times a day (BID) | ORAL | Status: DC

## 2015-08-04 MED ORDER — DULOXETINE HCL 60 MG PO CPEP: 60.0000 mg | ORAL_CAPSULE | Freq: Every day | ORAL | Status: DC
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
## 2015-09-03 ENCOUNTER — Telehealth (HOSPITAL_COMMUNITY): Payer: Self-pay | Admitting: *Deleted

## 2015-09-03 NOTE — Telephone Encounter (Signed)
phone call from patient she need refill of Adderall.

## 2015-09-06 NOTE — Telephone Encounter (Signed)
She was given 2 scripts on 08/04/15

## 2015-09-06 NOTE — Telephone Encounter (Signed)
Pt called stating she need refills for her Adderall. Pt number is [phone]

## 2015-09-06 NOTE — Telephone Encounter (Signed)
Pt is aware and shows understanding 

## 2015-10-05 ENCOUNTER — Encounter (HOSPITAL_COMMUNITY): Payer: Self-pay | Admitting: Psychiatry

## 2015-10-05 ENCOUNTER — Ambulatory Visit (INDEPENDENT_AMBULATORY_CARE_PROVIDER_SITE_OTHER): Payer: 59 | Admitting: *Deleted

## 2015-10-05 ENCOUNTER — Encounter: Payer: Self-pay | Admitting: *Deleted

## 2015-10-05 ENCOUNTER — Ambulatory Visit (INDEPENDENT_AMBULATORY_CARE_PROVIDER_SITE_OTHER): Payer: 59 | Admitting: Psychiatry

## 2015-10-05 VITALS — BP 118/91 | HR 133 | Ht 64.5 in | Wt 146.6 lb

## 2015-10-05 DIAGNOSIS — Z3042 Encounter for surveillance of injectable contraceptive: Secondary | ICD-10-CM | POA: Diagnosis not present

## 2015-10-05 DIAGNOSIS — F411 Generalized anxiety disorder: Secondary | ICD-10-CM

## 2015-10-05 DIAGNOSIS — F4310 Post-traumatic stress disorder, unspecified: Secondary | ICD-10-CM | POA: Diagnosis not present

## 2015-10-05 DIAGNOSIS — Z3202 Encounter for pregnancy test, result negative: Secondary | ICD-10-CM | POA: Diagnosis not present

## 2015-10-05 DIAGNOSIS — F332 Major depressive disorder, recurrent severe without psychotic features: Secondary | ICD-10-CM

## 2015-10-05 LAB — POCT URINE PREGNANCY: PREG TEST UR: NEGATIVE

## 2015-10-05 MED ORDER — ALPRAZOLAM 1 MG PO TABS: 1.0000 mg | ORAL_TABLET | Freq: Three times a day (TID) | ORAL | Status: DC

## 2015-10-05 MED ORDER — AMPHETAMINE-DEXTROAMPHETAMINE 30 MG PO TABS: 30.0000 mg | ORAL_TABLET | Freq: Two times a day (BID) | ORAL | Status: DC

## 2015-10-05 MED ORDER — MEDROXYPROGESTERONE ACETATE 150 MG/ML IM SUSP
150.0000 mg | Freq: Once | INTRAMUSCULAR | Status: AC
  Administered 2015-10-05: 150 mg via INTRAMUSCULAR

## 2015-10-05 MED ORDER — DULOXETINE HCL 60 MG PO CPEP: 60.0000 mg | ORAL_CAPSULE | Freq: Every day | ORAL | Status: DC

## 2015-10-05 NOTE — Progress Notes (Signed)
Pt here for Depo. Reports no problems at this time. Return in 12 weeks for next shot. [name] 
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
## 2015-10-29 ENCOUNTER — Other Ambulatory Visit: Payer: Self-pay | Admitting: Specialist

## 2015-11-01 ENCOUNTER — Telehealth (HOSPITAL_COMMUNITY): Payer: Self-pay | Admitting: *Deleted

## 2015-11-01 NOTE — Telephone Encounter (Signed)
Pt called stating she would like to be worked in for today. Informed pt provider do not have any slots open. Per pt, she don't think the Cymbalta is working anymore. Per pt, she don't feel like doing anything anymore. Per pt she is having crying spells at work. Per pt, she just don't feel like doing anything anymore, per pt as soon as she gets around people she starts to get nervious right then. Pt number is [phone].

## 2015-11-02 ENCOUNTER — Ambulatory Visit (HOSPITAL_COMMUNITY): Payer: Self-pay | Admitting: Psychiatry

## 2015-11-02 ENCOUNTER — Encounter (HOSPITAL_COMMUNITY): Payer: Self-pay | Admitting: *Deleted

## 2015-11-02 NOTE — Telephone Encounter (Signed)
Offered pt appt for 11-02-15 and she agreed with appt on 11-01-15. Pt mother called around 9:30am on 11-02-15 stating pt instructed her to call office on her behave to cancel appt due to her being very sleep because she had to work last night. Informed pt mother to please have pt call office and mother agreed to inform pt when she wakes up.

## 2015-11-02 NOTE — Telephone Encounter (Signed)
ok 

## 2015-11-02 NOTE — Telephone Encounter (Signed)
Please offer next available appt

## 2015-11-25 ENCOUNTER — Encounter: Payer: Self-pay | Admitting: Nurse Practitioner

## 2015-11-25 ENCOUNTER — Encounter: Payer: Self-pay | Admitting: Family Medicine

## 2015-11-25 ENCOUNTER — Ambulatory Visit (INDEPENDENT_AMBULATORY_CARE_PROVIDER_SITE_OTHER): Payer: 59 | Admitting: Nurse Practitioner

## 2015-11-25 VITALS — BP 122/82 | Temp 99.8°F | Ht 64.5 in | Wt 142.0 lb

## 2015-11-25 DIAGNOSIS — J111 Influenza due to unidentified influenza virus with other respiratory manifestations: Secondary | ICD-10-CM

## 2015-11-25 MED ORDER — ALBUTEROL SULFATE HFA 108 (90 BASE) MCG/ACT IN AERS: 2.0000 | INHALATION_SPRAY | RESPIRATORY_TRACT | Status: DC | PRN

## 2015-11-25 MED ORDER — OSELTAMIVIR PHOSPHATE 75 MG PO CAPS: 75.0000 mg | ORAL_CAPSULE | Freq: Two times a day (BID) | ORAL | Status: DC

## 2015-11-25 MED ORDER — HYDROCODONE-HOMATROPINE 5-1.5 MG/5ML PO SYRP: 5.0000 mL | ORAL_SOLUTION | ORAL | Status: DC | PRN

## 2015-11-25 NOTE — Progress Notes (Signed)
   Subjective:    Patient ID: Destiny Orr, female    DOB: 09/10/1981, 35 y.o.   MRN: [id number]  Cough This is a new problem. The current episode started yesterday. Associated symptoms include a fever, headaches, nasal congestion and wheezing.      Review of Systems  Constitutional: Positive for fever.  Respiratory: Positive for cough and wheezing.   Neurological: Positive for headaches.    presents for complaints of headache fever frequent nonproductive cough chills and dizziness that began last night. Chest tightness and burning with deep breath and cough. No wheezing. No ear pain or sore throat. No vomiting diarrhea or abdominal pain. Taking fluids well.  voiding normal limit. Fatigue. No acid reflux or heartburn.    Objective:   Physical Exam  NAD. Alert, oriented. TMs clear effusion, no erythema. Pharynx clear moist. Neck supple with minimal anterior adenopathy. Lungs 1 very faint expiratory wheeze noted left upper lobe posterior, otherwise clear. No tachypnea. Normal color. Heart regular rate rhythm. Fatigued in appearance.       Assessment & Plan:  Influenza  Meds ordered this encounter  Medications  . oseltamivir (TAMIFLU) 75 MG capsule    Sig: Take 1 capsule (75 mg total) by mouth 2 (two) times daily.    Dispense:  10 capsule    Refill:  0    Order Specific Question:  Supervising Provider    Answer:  Mikey Kirschner [[id number]]  . HYDROcodone-homatropine (HYCODAN) 5-1.5 MG/5ML syrup    Sig: Take 5 mLs by mouth every 4 (four) hours as needed.    Dispense:  120 mL    Refill:  0    Order Specific Question:  Supervising Provider    Answer:  Mikey Kirschner [[id number]]  . albuterol (PROVENTIL HFA;VENTOLIN HFA) 108 (90 Base) MCG/ACT inhaler    Sig: Inhale 2 puffs into the lungs every 4 (four) hours as needed.    Dispense:  1 Inhaler    Refill:  0    Order Specific Question:  Supervising Provider    Answer:  Mikey Kirschner [[id number]]    reviewed symptomatic care and warning  signs for the flu.  Prescription sent in for albuterol inhaler due to frequent cough tightness and current smoker. Call back in 4 days if no improvement, call or go to ED sooner if worse.

## 2015-11-26 ENCOUNTER — Encounter: Payer: Self-pay | Admitting: Nurse Practitioner

## 2015-11-30 ENCOUNTER — Telehealth (HOSPITAL_COMMUNITY): Payer: Self-pay | Admitting: *Deleted

## 2015-11-30 NOTE — Telephone Encounter (Signed)
Called pt to resch appt for April 3rd due to provider out of office. lmtcb and number provided.

## 2015-12-08 ENCOUNTER — Telehealth (HOSPITAL_COMMUNITY): Payer: Self-pay | Admitting: *Deleted

## 2015-12-08 NOTE — Telephone Encounter (Signed)
Called pt back to get more details. Per pt, she remember taking her printed script for her Xanax to pharmacy but was told by the pharmacy they do not have any refills. Per pt, she knows she recently handed it to them but she is out of her Xanax. Called pt pharmacy and spoke with Trip. Per Trip, pt does have refills on file and they are ready for pt to pick up now. Shortly after, pt called and informed office what was told her her by the pharmacy and she do not need refills for her Xanax.

## 2015-12-08 NOTE — Telephone Encounter (Signed)
PATIENT NEED REFILL OF Duanne Moron.

## 2015-12-21 ENCOUNTER — Encounter: Payer: Self-pay | Admitting: Family Medicine

## 2015-12-21 ENCOUNTER — Ambulatory Visit (INDEPENDENT_AMBULATORY_CARE_PROVIDER_SITE_OTHER): Payer: 59 | Admitting: Family Medicine

## 2015-12-21 ENCOUNTER — Ambulatory Visit (HOSPITAL_COMMUNITY)
Admission: RE | Admit: 2015-12-21 | Discharge: 2015-12-21 | Disposition: A | Discharge status: Home / Self Care | Payer: 59 | Source: Ambulatory Visit | Attending: Family Medicine | Admitting: Family Medicine

## 2015-12-21 VITALS — Temp 98.9°F | Ht 64.5 in | Wt 138.6 lb

## 2015-12-21 DIAGNOSIS — R05 Cough: Secondary | ICD-10-CM | POA: Insufficient documentation

## 2015-12-21 DIAGNOSIS — R000 Tachycardia, unspecified: Secondary | ICD-10-CM

## 2015-12-21 DIAGNOSIS — J189 Pneumonia, unspecified organism: Secondary | ICD-10-CM | POA: Diagnosis not present

## 2015-12-21 MED ORDER — CEFTRIAXONE SODIUM 1 G IJ SOLR
500.0000 mg | Freq: Once | INTRAMUSCULAR | Status: AC
  Administered 2015-12-21: 500 mg via INTRAMUSCULAR

## 2015-12-21 MED ORDER — AZITHROMYCIN 250 MG PO TABS: ORAL_TABLET | ORAL | Status: DC

## 2015-12-21 NOTE — Progress Notes (Addendum)
   Subjective:    Patient ID: Destiny Orr, female    DOB: 06-04-1981, 35 y.o.   MRN: [id number]  Cough This is a new problem. The current episode started 1 to 4 weeks ago (since having flu last month). Associated symptoms include headaches and nasal congestion. Treatments tried: otc meds.   Patient has had flulike illness a few weeks ago. Since then has been having coughing congestion over the past week discolored phlegm along with increased coughing and some shortness of breath at times she states she has to use an inhaler. She denies high fever chills sweats. Does relate low energy. Also relates tachycardia with walking.  She does smoke she know she needs to quit she uses albuterol on an occasional basis No personal history of blood clots but does have a history of tachycardia 1 salt cardiology. See above.   Review of Systems  Respiratory: Positive for cough.   Neurological: Positive for headaches.   See above    Objective:   Physical Exam  Does not appear to be toxic she is interactive talkative O2 sat 98% heart rate tachycardic 120s lungs are clear heart tachycardic cough is noted extremities no edema      Assessment & Plan:  Stat chest x-ray ordered, Rocephin 500 mg IM, Z-Pak, possibility of pneumonia-if patient becomes worse she needs to go to the ER. Follow-up if progressive troubles. Recheck if worse. Warning signs were discussed with the patient in detail.  Xray results seen, pt will be rechecked in the am. On phone discussion of xray results she relays long standing tachycardia tht she did not follow thru with cardiology recommendations. We will recheck her in the am and do EKG. If her SOB is out of proportions to her illness then may need additional testing- possibly D Dimer, thyroid testing, possible ECHO-cardio referral and possible CT chest. Pt to go to er if worse.

## 2015-12-22 NOTE — Addendum Note (Signed)
Addended by: Dairl Ponder on: 12/22/2015 09:49 AM   Modules accepted: Orders

## 2015-12-22 NOTE — Addendum Note (Signed)
Addended by: Carmelina Noun on: 12/22/2015 09:12 AM   Modules accepted: Orders

## 2015-12-22 NOTE — Progress Notes (Signed)
Patient ID: SENAI LEBOVITS, female   DOB: 1981/01/22, 35 y.o.   MRN: [id number]     Cardiology Office Note   Date:  12/23/2015   ID:  Destiny Orr, DOB January 07, 1981, MRN [id number]  PCP:  Mickie Hillier, MD  Cardiologist:   Jenkins Rouge, MD   No chief complaint on file.     History of Present Illness: Destiny Orr is a 35 y.o. female who presents for evaluation of tachycardia.  Had flu last month and has not felt well since  Associated symptoms include headaches and nasal congestion. Treatments tried: otc meds.   Patient has had flulike illness a few weeks ago. Since then has been having coughing congestion over the past week discolored phlegm along with increased coughing and some shortness of breath at times she states she has to use an inhaler. She denies high fever chills sweats. Does relate low energy. Also relates tachycardia with walking.  She does smoke she know she needs to quit she uses albuterol on an occasional basis CXR reviewed from 12/21/15 and NAD no CE   Given Rocephin 500 mg IM and Z pack labs including CBC , d dimer BNP and TSH ordered but not done yet   She has long standing anxiety and depression.  Is on xanax and more recently Cymbalta.  Also on Adderall for ADHD   Labs back so far from yesterday showed normal TSH/ HCT  BNP pending   Has had monitor before when she was pregnant.  No chest pain Rapid HR starts when she wakes in am  No syncope Denies other drugs       Past Medical History  Diagnosis Date  . Anxiety   . Depression   . Pneumothorax, spontaneous, tension   . PTSD (post-traumatic stress disorder)   . Arthritis   . DJD (degenerative joint disease)   . Scoliosis   . Fatigue 12/24/2012  . Hx of migraine headaches 12/24/2012  . Dysmenorrhea 07/15/2014  . Irregular menstrual bleeding 07/15/2014  . Abnormal uterine bleeding (AUB) 12/09/2014  . Headache   . Thyroid disease   . Fibromyalgia diagnosed April 2016    Past Surgical History  Procedure  Laterality Date  . Knee surgery    . Cesarean section    . Bone spurs toes Right      Current Outpatient Prescriptions  Medication Sig Dispense Refill  . albuterol (PROVENTIL HFA;VENTOLIN HFA) 108 (90 Base) MCG/ACT inhaler Inhale 2 puffs into the lungs every 4 (four) hours as needed. 1 Inhaler 0  . ALPRAZolam (XANAX) 1 MG tablet Take 1 tablet (1 mg total) by mouth 3 (three) times daily. 90 tablet 2  . amphetamine-dextroamphetamine (ADDERALL) 30 MG tablet Take 1 tablet by mouth 2 (two) times daily. 60 tablet 0  . azithromycin (ZITHROMAX Z-PAK) 250 MG tablet Take 2 tablets (500 mg) on  Day 1,  followed by 1 tablet (250 mg) once daily on Days 2 through 5. 6 each 0  . Cyanocobalamin (VITAMIN B 12 PO) Take by mouth daily.    . DULoxetine (CYMBALTA) 60 MG capsule Take 1 capsule (60 mg total) by mouth daily. 30 capsule 2  . ibuprofen (ADVIL,MOTRIN) 200 MG tablet Take 400 mg by mouth every 6 (six) hours as needed. Pain    . medroxyPROGESTERone (DEPO-PROVERA) 150 MG/ML injection INJECT 1 VIAL INTRAMUSCULARLY EVERY 3 MONTHS IN OFFICE. 1 mL 4   No current facility-administered medications for this visit.    Allergies:   Lexapro  Social History:  The patient  reports that she has been smoking Cigarettes.  She started smoking about 18 years ago. She has a 22.5 pack-year smoking history. She has never used smokeless tobacco. She reports that she drinks alcohol. She reports that she does not use illicit drugs.   Family History:  The patient's family history includes Alcohol abuse in her father; Anxiety disorder in her maternal aunt, maternal uncle, and mother; Atrial fibrillation in her father; Bipolar disorder in her maternal uncle; COPD in her paternal grandfather; Cancer in her maternal grandmother; Diabetes in her paternal grandfather; Heart disease in her maternal grandmother and paternal grandfather; Hypertension in her father and paternal grandfather; Other in her maternal grandmother; Stroke in  her paternal grandfather.    ROS:  Please see the history of present illness.   Otherwise, review of systems are positive for none.   All other systems are reviewed and negative.    PHYSICAL EXAM: VS:  BP 108/80 mmHg  Pulse 122  Ht 5\' 4"  (1.626 m)  Wt 62.596 kg (138 lb)  BMI 23.68 kg/m2  SpO2 98% , BMI Body mass index is 23.68 kg/(m^2). Affect appropriate Depressed chronically ill appearing female with nicotine on breath  HEENT: normal Neck supple with no adenopathy JVP normal no bruits no thyromegaly Lungs clear with no wheezing and good diaphragmatic motion Heart:  S1/S2 no murmur, no rub, gallop or click PMI normal Abdomen: benighn, BS positve, no tenderness, no AAA no bruit.  No HSM or HJR Distal pulses intact with no bruits No edema Neuro non-focal Skin warm and dry No muscular weakness    EKG:  04/13/14  ST rate 113 poor R wave progression   12/22/15 SR  Rate 98  Otherwise normal    Recent Labs: 12/22/2015: BNP WILL FOLLOW; BUN 8; Creatinine, Ser 0.81; Platelets 340; Potassium 4.7; Sodium 139; TSH 1.520    Lipid Panel    Component Value Date/Time   CHOL 176 01/07/2013 0949   TRIG 157* 01/07/2013 0949   HDL 55 01/07/2013 0949   CHOLHDL 3.2 01/07/2013 0949   VLDL 31 01/07/2013 0949   LDLCALC 90 01/07/2013 0949      Wt Readings from Last 3 Encounters:  12/23/15 62.596 kg (138 lb)  12/21/15 62.869 kg (138 lb 9.6 oz)  11/25/15 64.411 kg (142 lb)      Other studies Reviewed: Additional studies/ records that were reviewed today include: Primary notes and ECG.    ASSESSMENT AND PLAN:  1.  Tachycardia:  Likely functional  Discussed avoiding excess adderall and albuterol.  Also discussed nicotine effect on HR.  F/U 48 hr holter to see average HR and see if she has normal Circadian fall at night.  Echo to r/o DCM with recent viral infection 2. Smoking/COPD  Down from 2ppd to 1/2  Discussed strategies for cessation needs yearly Xray no active wheezing today  PRN inhaler 3. Anxiety:  PRN xanax and counseling per primary  4. URI:  Finishing Z pack no fever    Current medicines are reviewed at length with the patient today.  The patient does not have concerns regarding medicines.  The following changes have been made:  Bystolic 10 mg  Labs/ tests ordered today include: Holter  Echo   No orders of the defined types were placed in this encounter.     Disposition:   FU with PA/ [place] doctor after tests      Signed, Jenkins Rouge, MD  12/23/2015 9:14 AM  [facility] [address], [place], [place]  [address] Phone: [phone]; Fax: [phone]

## 2015-12-23 ENCOUNTER — Encounter: Payer: Self-pay | Admitting: Cardiovascular Disease

## 2015-12-23 ENCOUNTER — Ambulatory Visit (INDEPENDENT_AMBULATORY_CARE_PROVIDER_SITE_OTHER): Payer: 59 | Admitting: Cardiovascular Disease

## 2015-12-23 VITALS — BP 108/80 | HR 122 | Ht 64.0 in | Wt 138.0 lb

## 2015-12-23 DIAGNOSIS — R000 Tachycardia, unspecified: Secondary | ICD-10-CM

## 2015-12-23 LAB — CBC WITH DIFFERENTIAL/PLATELET
BASOS ABS: 0.1 10*3/uL (ref 0.0–0.2)
Basos: 1 %
EOS (ABSOLUTE): 0.1 10*3/uL (ref 0.0–0.4)
Eos: 1 %
HEMOGLOBIN: 14.9 g/dL (ref 11.1–15.9)
Hematocrit: 43.1 % (ref 34.0–46.6)
Immature Grans (Abs): 0 10*3/uL (ref 0.0–0.1)
Immature Granulocytes: 0 %
LYMPHS ABS: 2.6 10*3/uL (ref 0.7–3.1)
Lymphs: 29 %
MCH: 31.2 pg (ref 26.6–33.0)
MCHC: 34.6 g/dL (ref 31.5–35.7)
MCV: 90 fL (ref 79–97)
MONOCYTES: 7 %
Monocytes Absolute: 0.6 10*3/uL (ref 0.1–0.9)
NEUTROS ABS: 5.6 10*3/uL (ref 1.4–7.0)
Neutrophils: 62 %
Platelets: 340 10*3/uL (ref 150–379)
RBC: 4.77 x10E6/uL (ref 3.77–5.28)
RDW: 14.2 % (ref 12.3–15.4)
WBC: 8.9 10*3/uL (ref 3.4–10.8)

## 2015-12-23 LAB — BASIC METABOLIC PANEL
BUN/Creatinine Ratio: 10 (ref 8–20)
BUN: 8 mg/dL (ref 6–20)
CALCIUM: 9.6 mg/dL (ref 8.7–10.2)
CO2: 23 mmol/L (ref 18–29)
Chloride: 100 mmol/L (ref 96–106)
Creatinine, Ser: 0.81 mg/dL (ref 0.57–1.00)
GFR, EST AFRICAN AMERICAN: 110 mL/min/{1.73_m2} (ref >59)
GFR, EST NON AFRICAN AMERICAN: 95 mL/min/{1.73_m2} (ref >59)
Glucose: 92 mg/dL (ref 65–99)
POTASSIUM: 4.7 mmol/L (ref 3.5–5.2)
Sodium: 139 mmol/L (ref 134–144)

## 2015-12-23 LAB — T3 UPTAKE: T3 UPTAKE RATIO: 24 % (ref 24–39)

## 2015-12-23 LAB — D-DIMER, QUANTITATIVE: D-DIMER: 0.43 mg/L FEU (ref 0.00–0.49)

## 2015-12-23 LAB — T4, FREE: FREE T4: 0.91 ng/dL (ref 0.82–1.77)

## 2015-12-23 LAB — TSH: TSH: 1.52 u[IU]/mL (ref 0.450–4.500)

## 2015-12-23 LAB — BRAIN NATRIURETIC PEPTIDE: BNP: <2.5 pg/mL (ref 0.0–100.0)

## 2015-12-23 MED ORDER — NEBIVOLOL HCL 10 MG PO TABS: 10.0000 mg | ORAL_TABLET | Freq: Every day | ORAL | Status: DC

## 2015-12-23 NOTE — Patient Instructions (Signed)
Your physician recommends that you schedule a follow-up appointment in: 1 month    START Bystolic 10 mg daily   Your physician has requested that you have an echocardiogram. Echocardiography is a painless test that uses sound waves to create images of your heart. It provides your doctor with information about the size and shape of your heart and how well your heart's chambers and valves are working. This procedure takes approximately one hour. There are no restrictions for this procedure.  Your physician has recommended that you wear a holter monitor. Holter monitors are medical devices that record the heart's electrical activity. Doctors most often use these monitors to diagnose arrhythmias. Arrhythmias are problems with the speed or rhythm of the heartbeat. The monitor is a small, portable device. You can wear one while you do your normal daily activities. This is usually used to diagnose what is causing palpitations/syncope (passing out).    Thank you for choosing [facility] !

## 2015-12-24 ENCOUNTER — Other Ambulatory Visit: Payer: Self-pay | Admitting: *Deleted

## 2015-12-24 ENCOUNTER — Telehealth: Payer: Self-pay | Admitting: Family Medicine

## 2015-12-24 MED ORDER — AMOXICILLIN-POT CLAVULANATE 875-125 MG PO TABS: 1.0000 | ORAL_TABLET | Freq: Two times a day (BID) | ORAL | Status: DC

## 2015-12-24 NOTE — Telephone Encounter (Signed)
Discussed with pt

## 2015-12-24 NOTE — Telephone Encounter (Signed)
Discussed with pt. Med sent to pharm.  

## 2015-12-24 NOTE — Telephone Encounter (Signed)
Pt was told to call today if her headaches are not any better. Pt was seen on Tues and states that they are not any better. Please advise.

## 2015-12-24 NOTE — Telephone Encounter (Signed)
I would recommend the patient go ahead and start Augmentin 875 mg 1 twice a day 10 days she may finish out the Zithromax as well. Take medication with a snack. If not improving by early next week she should consider getting recheck

## 2015-12-24 NOTE — Telephone Encounter (Signed)
I rec ECHO and telemetry as ordered. BNP good- doubt CHF, D dimer nl therefore risk of pulm embolis very unlikely, follow up ov with cardiology and f/u ov with dr Richardson Landry after cardio eval, also follow up here or er sooner if worse ( d dimer if in normal range helps state low risk  Orf pulm embolis)

## 2015-12-24 NOTE — Telephone Encounter (Signed)
Nurses please call patient find out any fever? Mucoid drainage? Is a headache all over or is it just in the frontal sinuses? Sweats and chills? Taking anything for the headache? Is helping question

## 2015-12-24 NOTE — Telephone Encounter (Addendum)
Pt wanted results of BNP. Wants to know what would have made her platelets are the highest they have ever been. and why her d dimer was on the upper end of normal.  Also not happy with cardiologist visit.  Has to go back next Wednesday for ultrasound and to get heart monitor.

## 2015-12-24 NOTE — Telephone Encounter (Signed)
Pt not sure if she is having fever. Has not checked. Doesn't feel like she is having any fevers although she is having some chills and sweats. Sinus drainage is thick and yellow. Headache all over. Dull pain. Taking ibuprofen. Eases headaches some but does not clear up.   Pt also wants results of BNP.

## 2015-12-27 ENCOUNTER — Encounter: Payer: Self-pay | Admitting: Family Medicine

## 2015-12-27 ENCOUNTER — Telehealth: Payer: Self-pay | Admitting: Family Medicine

## 2015-12-27 NOTE — Telephone Encounter (Signed)
W/E done  °

## 2015-12-27 NOTE — Telephone Encounter (Signed)
pts work will not release her to return to work until they get a diagnose and reason  For her SOB, dizziness, chest pains and increased HR. Wants a work excuse extended For 28th & 29th. She does not return back to work after those today days until the 5th of Apr  Or does she need to get this extension from her specialist? She was seen by Dr Nicki Reaper several times  last week for these issues. Is under cardiology care now with having to put a holster on Wed for 48 hrs  Along with further testing.

## 2015-12-27 NOTE — Telephone Encounter (Signed)
Please give patient work excuse for the days needed per Dr. Nicki Reaper. Patient verbalized understanding and is following up with cardiology this week.

## 2015-12-28 ENCOUNTER — Ambulatory Visit: Payer: 59

## 2015-12-28 ENCOUNTER — Ambulatory Visit (INDEPENDENT_AMBULATORY_CARE_PROVIDER_SITE_OTHER): Payer: 59 | Admitting: *Deleted

## 2015-12-28 DIAGNOSIS — Z3202 Encounter for pregnancy test, result negative: Secondary | ICD-10-CM | POA: Diagnosis not present

## 2015-12-28 DIAGNOSIS — Z3042 Encounter for surveillance of injectable contraceptive: Secondary | ICD-10-CM

## 2015-12-28 LAB — POCT URINE PREGNANCY: Preg Test, Ur: NEGATIVE

## 2015-12-28 MED ORDER — MEDROXYPROGESTERONE ACETATE 150 MG/ML IM SUSP
150.0000 mg | Freq: Once | INTRAMUSCULAR | Status: AC
  Administered 2015-12-28: 150 mg via INTRAMUSCULAR

## 2015-12-28 NOTE — Progress Notes (Signed)
Patient ID: Destiny Orr, female   DOB: Jan 09, 1981, 35 y.o.   MRN: [id number] Depo Provera 150 mg IM given right ventrogluteal with no complications, negative pregnancy test. Pt to return in 12 weeks for next injection.

## 2015-12-29 ENCOUNTER — Telehealth: Payer: Self-pay | Admitting: Cardiovascular Disease

## 2015-12-29 ENCOUNTER — Ambulatory Visit (HOSPITAL_COMMUNITY)
Admission: RE | Admit: 2015-12-29 | Discharge: 2015-12-29 | Disposition: A | Discharge status: Home / Self Care | Payer: 59 | Source: Ambulatory Visit | Attending: Cardiovascular Disease | Admitting: Cardiovascular Disease

## 2015-12-29 ENCOUNTER — Ambulatory Visit (HOSPITAL_BASED_OUTPATIENT_CLINIC_OR_DEPARTMENT_OTHER)
Admission: RE | Admit: 2015-12-29 | Discharge: 2015-12-29 | Disposition: A | Discharge status: Home / Self Care | Payer: 59 | Source: Ambulatory Visit | Attending: Cardiovascular Disease | Admitting: Cardiovascular Disease

## 2015-12-29 DIAGNOSIS — R000 Tachycardia, unspecified: Secondary | ICD-10-CM | POA: Insufficient documentation

## 2015-12-29 NOTE — Telephone Encounter (Signed)
New message ° ° °Pt returning call °

## 2015-12-29 NOTE — Telephone Encounter (Signed)
Called patient about echo results. Per Dr. Johnsie Cancel, Normal echo with good EF and no significant valvular heart disease. Patient verbalized understanding.

## 2015-12-30 ENCOUNTER — Ambulatory Visit (HOSPITAL_COMMUNITY): Payer: Self-pay | Admitting: Psychiatry

## 2016-01-03 ENCOUNTER — Telehealth (HOSPITAL_COMMUNITY): Payer: Self-pay | Admitting: *Deleted

## 2016-01-03 ENCOUNTER — Ambulatory Visit (HOSPITAL_COMMUNITY): Payer: Self-pay | Admitting: Psychiatry

## 2016-01-03 NOTE — Telephone Encounter (Signed)
patient called, she need refill of Adderall.  She will be out tomorrow.

## 2016-01-04 ENCOUNTER — Encounter (HOSPITAL_COMMUNITY): Payer: Self-pay | Admitting: *Deleted

## 2016-01-04 ENCOUNTER — Telehealth: Payer: Self-pay

## 2016-01-04 ENCOUNTER — Other Ambulatory Visit (HOSPITAL_COMMUNITY): Payer: Self-pay | Admitting: Psychiatry

## 2016-01-04 ENCOUNTER — Telehealth: Payer: Self-pay | Admitting: Family Medicine

## 2016-01-04 MED ORDER — AMPHETAMINE-DEXTROAMPHETAMINE 30 MG PO TABS: 30.0000 mg | ORAL_TABLET | Freq: Two times a day (BID) | ORAL | Status: DC

## 2016-01-04 NOTE — Telephone Encounter (Signed)
She is on adderall, smokes and has albuterol all can make HR seem high/forceful An f/u with PA or [facility] in [place] can consider ILR since nothing seen on holter

## 2016-01-04 NOTE — Telephone Encounter (Signed)
Please see request from patient's workplace. Please find out from the patient has she returned to work. She under any restrictions from cardiology? This will help [place] fill the form out please see highlighted section and try his best he can to get answers for that section

## 2016-01-04 NOTE — Telephone Encounter (Signed)
printed

## 2016-01-04 NOTE — Telephone Encounter (Signed)
Pt states she has not used albuterol or adderall since office visit,agrees to apt this week

## 2016-01-04 NOTE — Telephone Encounter (Signed)
Spoke with pt and she showed understanding 

## 2016-01-04 NOTE — Telephone Encounter (Signed)
Patient calling back. Talked with patient earlier about her 48 hour holter monitor results. Patient is now calling back and wants to know why her test are coming back normal when she is still having symptoms. Patient is complaining of racing heart, SOB, weakness, and black outs. Patient denies this symptoms at the present moment. Patient stated while she had holter monitor on she did not do anything except lay around all day. Patient stated that she gets symptoms when she goes out of the house, or when she is at work. Patient has appointment with her PCP tomorrow. Informed patient that seeing her PCP tomorrow would be beneficial. Encouraged patient to let her PCP know how she is feeling and see what else could be causing her symptoms. Patient seems to be anxious about the situation, due to her elevated tone in her voice. Informed patient that a message would be sent to Dr. Johnsie Cancel and Dr. Harl Bowie. Patient stated she wants to know what is going on with her heart. Patient has follow-up appointment with Dr. Harl Bowie in May. Will forward for further advisement.

## 2016-01-04 NOTE — Telephone Encounter (Signed)
LM for pt to call back,made apt to see NP this week

## 2016-01-04 NOTE — Progress Notes (Signed)
Pt came into office to pick up printed script for her Adderall 30 mg. Pt D/L number is [id number] with expiration date of 03-13-2022. Pt agreed with printed script.

## 2016-01-05 ENCOUNTER — Encounter: Payer: Self-pay | Admitting: Nurse Practitioner

## 2016-01-05 ENCOUNTER — Ambulatory Visit (INDEPENDENT_AMBULATORY_CARE_PROVIDER_SITE_OTHER): Payer: 59 | Admitting: Nurse Practitioner

## 2016-01-05 VITALS — BP 144/98 | Ht 64.5 in | Wt 145.0 lb

## 2016-01-05 DIAGNOSIS — R5383 Other fatigue: Secondary | ICD-10-CM | POA: Diagnosis not present

## 2016-01-05 DIAGNOSIS — F32A Depression, unspecified: Secondary | ICD-10-CM

## 2016-01-05 DIAGNOSIS — R000 Tachycardia, unspecified: Secondary | ICD-10-CM | POA: Diagnosis not present

## 2016-01-05 DIAGNOSIS — R61 Generalized hyperhidrosis: Secondary | ICD-10-CM

## 2016-01-05 DIAGNOSIS — F419 Anxiety disorder, unspecified: Secondary | ICD-10-CM | POA: Diagnosis not present

## 2016-01-05 DIAGNOSIS — F329 Major depressive disorder, single episode, unspecified: Secondary | ICD-10-CM | POA: Diagnosis not present

## 2016-01-05 DIAGNOSIS — Z0289 Encounter for other administrative examinations: Secondary | ICD-10-CM

## 2016-01-05 NOTE — Telephone Encounter (Signed)
Noted. See visit 01/05/16

## 2016-01-05 NOTE — Telephone Encounter (Signed)
Spoke with patient and patient stated that she is unsure if she can go back to work. She also stated that she has an appointment today in our office with [name] and another appointment with cardiology on tomorrow for additional testing. Patient was told by work that she needs to have a diagnoses before returning to work due to dizziness and near syncope spells.

## 2016-01-05 NOTE — Patient Instructions (Signed)
Pheochromocytoma

## 2016-01-05 NOTE — Telephone Encounter (Signed)
Certainly we are sympathetic with what is going on with the patient. Please forward this form to [place]. Hopefully between the visits here and the visits with cardiology diagnosis can be specified and hopefully helped so that the patient can return to work soon. If we are uncertain regarding the cardiology issue and the safety of returning to work it is very important for the patient to directly asked that question of cardiology when she is there for follow-up visit in order to get their opinion regarding diagnosis, treatment, and when she can return to work

## 2016-01-05 NOTE — Progress Notes (Addendum)
Subjective:  Presents for follow-up. Has had extensive workup both at our office and cardiology. Has an appointment for follow-up in cardiology tomorrow. Nothing in the testing so far explains her symptoms. Patient states "she knows something is wrong". Having multitude of symptoms, has a small notebook with her with several pages of symptomatology. When symptoms of anxiety and stress were discussed patient states that was the symptoms and her notes. Very resistant to discussing mental health component to her symptoms. Definitely leaning towards this being a physical problem and we are missing the diagnosis. When asked about seeing Dr. Harrington Challenger for her major depression, patient states she has appointment in the near future but this has not been a problem "for a while" just when she was going through her divorce with her husband. When confronted with the information that she just saw her in January, patient changed her story and said that her husband had been giving her a hard time. Works long hours at a very stressful job. Has 3 children. Currently living with her mother but plans to get her own home. Does admit to being under stress but does not seem to understand the connection between her anxiety and mental health issues and her physical symptoms. Discussed the physiological changes related to anxiety and stress. Patient denies any acid reflux but near the end of the visit leans back and put her hand directly in the epigastric area with grimacing. Complaints of fatigue headaches and occasional excessive sweating. Has stopped her Cymbalta, thinks it was "awhile back". States it made her "too flat".  Objective:   BP 144/98 mmHg  Ht 5' 4.5" (1.638 m)  Wt 145 lb (65.772 kg)  BMI 24.51 kg/m2 NAD. Alert, oriented. Thoughts logical coherent and relevant. Mildly anxious affect. Slight tremors at times. Crying at times. Heart regular rate rhythm. Abdomen soft nondistended nontender particularly in the epigastric area  were patient was stating less than 1 minute before that was uncomfortable.  Assessment:  Problem List Items Addressed This Visit      Other   Anxiety   Depression   Fatigue   Relevant Orders   Catecholamines, fractionated, urine, 24 hour   Metanephrines, Urine, 24 hour    Other Visit Diagnoses    Tachycardia    -  Primary    Relevant Orders    Catecholamines, fractionated, urine, 24 hour    Metanephrines, Urine, 24 hour    Excessive sweating        Relevant Orders    Catecholamines, fractionated, urine, 24 hour    Metanephrines, Urine, 24 hour      Plan: Although it is unlikely, obtain testing for possible pheochromocytoma. Over 30 minutes was spent with this patient discussing the relationship between mental and physical health. Due to the diversity and long list of her complaints, it is unclear which specialist she would need even if a referral would be made. Our plan is to finish up consultation with cardiology and recheck with Dr. Harrington Challenger as planned.

## 2016-01-06 ENCOUNTER — Ambulatory Visit (INDEPENDENT_AMBULATORY_CARE_PROVIDER_SITE_OTHER): Payer: 59 | Admitting: Adult Health

## 2016-01-06 ENCOUNTER — Encounter (HOSPITAL_COMMUNITY): Payer: Self-pay | Admitting: Psychiatry

## 2016-01-06 ENCOUNTER — Telehealth: Payer: Self-pay | Admitting: Family Medicine

## 2016-01-06 ENCOUNTER — Encounter: Payer: Self-pay | Admitting: Adult Health

## 2016-01-06 ENCOUNTER — Ambulatory Visit (INDEPENDENT_AMBULATORY_CARE_PROVIDER_SITE_OTHER): Payer: 59 | Admitting: Psychiatry

## 2016-01-06 VITALS — BP 128/90 | HR 133 | Ht 65.0 in | Wt 142.6 lb

## 2016-01-06 VITALS — BP 122/80 | HR 122 | Ht 65.0 in | Wt 142.0 lb

## 2016-01-06 DIAGNOSIS — Z720 Tobacco use: Secondary | ICD-10-CM | POA: Diagnosis not present

## 2016-01-06 DIAGNOSIS — R000 Tachycardia, unspecified: Secondary | ICD-10-CM

## 2016-01-06 DIAGNOSIS — F4310 Post-traumatic stress disorder, unspecified: Secondary | ICD-10-CM | POA: Diagnosis not present

## 2016-01-06 DIAGNOSIS — F332 Major depressive disorder, recurrent severe without psychotic features: Secondary | ICD-10-CM | POA: Diagnosis not present

## 2016-01-06 DIAGNOSIS — F411 Generalized anxiety disorder: Secondary | ICD-10-CM | POA: Diagnosis not present

## 2016-01-06 MED ORDER — NEBIVOLOL HCL 5 MG PO TABS: 5.0000 mg | ORAL_TABLET | Freq: Every day | ORAL | Status: DC

## 2016-01-06 MED ORDER — ALPRAZOLAM 1 MG PO TABS: 1.0000 mg | ORAL_TABLET | Freq: Three times a day (TID) | ORAL | Status: DC

## 2016-01-06 NOTE — Progress Notes (Signed)
Cardiology Office Note   Date:  01/06/2016   ID:  Destiny Orr, DOB Mar 16, 1981, MRN [id number]  PCP:  Mickie Hillier, MD  Cardiologist: Lamar Sprinkles, NP   Chief Complaint  Patient presents with  . Tachycardia      History of Present Illness: Destiny Orr is a 35 y.o. female who presents for *ongoing assessment and management of tachycardia. She was advised to avoid excess, Adderall, and albuterol treatments, quit smoking do to nicotine effect on heart rate.  A Holter monitor was placed.this was reviewed by Dr. [id number] revealing sinus rhythm with sinus tachycardia, heart rateNishan from 56 beats per minute at 143 beats per minute with average heart rate of 90 beats per minute.  No sustained arrhythmias.  Rare PACs, and PVCs.  No pauses.  There were no changes in her medication regimen.  She was seen on 01/05/2016 by primary care provider, Pearson Forster, nurse practitioner.  She came to her with multiple somatic complaints and was referred to psychiatry, Dr. Harrington Challenger, but the patient was resistant to seeking help for mental health issues.  When seen by Dr. Johnsie Cancel last she was given a prescription for by systolic 10 mg daily.  She has not picked it up as she was waiting to hear from our office as to whether to take it or not, based on her cardiac monitor results.  She states she never heard back from our office.  She comes today tachycardic, with a heart 122 beats per minute.  She states she has not used Adderall, and over 3 weeks.  She admits to drinking tea and continuing to smoke although she has dropped to to half a pack a day.  Past Medical History  Diagnosis Date  . Anxiety   . Depression   . Pneumothorax, spontaneous, tension   . PTSD (post-traumatic stress disorder)   . Arthritis   . DJD (degenerative joint disease)   . Scoliosis   . Fatigue 12/24/2012  . Hx of migraine headaches 12/24/2012  . Dysmenorrhea 07/15/2014  . Irregular menstrual bleeding 07/15/2014  . Abnormal  uterine bleeding (AUB) 12/09/2014  . Headache   . Thyroid disease   . Fibromyalgia diagnosed April 2016    Past Surgical History  Procedure Laterality Date  . Knee surgery    . Cesarean section    . Bone spurs toes Right      Current Outpatient Prescriptions  Medication Sig Dispense Refill  . albuterol (PROVENTIL HFA;VENTOLIN HFA) 108 (90 Base) MCG/ACT inhaler Inhale 2 puffs into the lungs every 4 (four) hours as needed. (Patient not taking: Reported on 01/06/2016) 1 Inhaler 0  . Cyanocobalamin (VITAMIN B 12 PO) Take by mouth daily. Reported on 01/06/2016    . ibuprofen (ADVIL,MOTRIN) 200 MG tablet Take 400 mg by mouth every 6 (six) hours as needed. Pain    . medroxyPROGESTERone (DEPO-PROVERA) 150 MG/ML injection INJECT 1 VIAL INTRAMUSCULARLY EVERY 3 MONTHS IN OFFICE. 1 mL 4  . ALPRAZolam (XANAX) 1 MG tablet Take 1 tablet (1 mg total) by mouth 3 (three) times daily. 90 tablet 2  . nebivolol (BYSTOLIC) 5 MG tablet Take 1 tablet (5 mg total) by mouth daily. 30 tablet 6   No current facility-administered medications for this visit.    Allergies:   Lexapro    Social History:  The patient  reports that she has been smoking Cigarettes.  She started smoking about 18 years ago. She has a 22.5 pack-year smoking history. She has never used smokeless  tobacco. She reports that she drinks alcohol. She reports that she does not use illicit drugs.   Family History:  The patient's family history includes Alcohol abuse in her father; Anxiety disorder in her maternal aunt, maternal uncle, and mother; Atrial fibrillation in her father; Bipolar disorder in her maternal uncle; COPD in her paternal grandfather; Cancer in her maternal grandmother; Diabetes in her paternal grandfather; Heart disease in her maternal grandmother and paternal grandfather; Hypertension in her father and paternal grandfather; Other in her maternal grandmother; Stroke in her paternal grandfather.    ROS: All other systems are reviewed  and negative. Unless otherwise mentioned in H&P    PHYSICAL EXAM: VS:  BP 122/80 mmHg  Pulse 122  Ht 5\' 5"  (1.651 m)  Wt 142 lb (64.411 kg)  BMI 23.63 kg/m2  SpO2 96% , BMI Body mass index is 23.63 kg/(m^2). GEN: Well nourished, well developed, in no acute distress HEENT: normal Neck: no JVD, carotid bruits, or masses Cardiac: RRR; Tachycardic,no murmurs, rubs, or gallops,no edema  Respiratory:  clear to auscultation bilaterally, normal work of breathing GI: soft, nontender, nondistended, + BS MS: no deformity or atrophy Skin: warm and dry, no rash Neuro:  Strength and sensation are intact Psych: euthymic mood, full affect   EKG:The ekg ordered today demonstrates sinus tachycardia, rate 129 beats per minute.   Recent Labs: 12/22/2015: BNP <2.5; BUN 8; Creatinine, Ser 0.81; Platelets 340; Potassium 4.7; Sodium 139; TSH 1.520    Lipid Panel    Component Value Date/Time   CHOL 176 01/07/2013 0949   TRIG 157* 01/07/2013 0949   HDL 55 01/07/2013 0949   CHOLHDL 3.2 01/07/2013 0949   VLDL 31 01/07/2013 0949   LDLCALC 90 01/07/2013 0949      Wt Readings from Last 3 Encounters:  01/06/16 142 lb 9.6 oz (64.683 kg)  01/06/16 142 lb (64.411 kg)  01/05/16 145 lb (65.772 kg)      ASSESSMENT AND PLAN:  1.  Sinus tachycardia: Heart rate in the office for EKG 129 beats per minute.  She has not been taking Bystolic until she hears from our office.  Blood pressure is soft, at 122/80.  I will start her on 5 mg daily.  Have her return to the office in a week to have a blood pressure and heart rate checked.  If heart rate is still elevated.  The blood pressure is stable.  Will increase it to 10 mg.  She is advised on caffeine intake and to switch to decaf. She will keep her appointment with Dr. Harl Bowie on 02/15/2016.  2. Tobacco abuse: she has been advised on smoking cessation.  She states she is drop from 2 packs a day to one half pack a day.  Current medicines are reviewed at length  with the patient today.    Labs/ tests ordered today include:   Orders Placed This Encounter  Procedures  . EKG 12-Lead     Disposition:   FU with Dr.Branch previously scheduled appointment.  Signed, Jory Sims, NP  01/06/2016 4:30 PM    [facility] [address] [place], [place] [address] Phone: [phone]; Fax: [phone]

## 2016-01-06 NOTE — Progress Notes (Deleted)
Name: Destiny Orr    DOB: March 29, 1981  Age: 35 y.o.  MR#: [id number]       PCP:  Mickie Hillier, MD      Insurance: Payor: Onnie Boer / Plan: [employer] OTHER / Product Type: *No Product type* /   CC:   No chief complaint on file.   VS Filed Vitals:   01/06/16 1501  BP: 122/80  Pulse: 122  Height: 5' 5"  (1.651 m)  Weight: 142 lb (64.411 kg)  SpO2: 96%    Weights Current Weight  01/06/16 142 lb (64.411 kg)  01/05/16 145 lb (65.772 kg)  12/23/15 138 lb (62.596 kg)    Blood Pressure  BP Readings from Last 3 Encounters:  01/06/16 122/80  01/05/16 144/98  12/23/15 108/80     Admit date:  (Not on file) Last encounter with RMR:  Visit date not found   Allergy Lexapro  Current Outpatient Prescriptions  Medication Sig Dispense Refill  . albuterol (PROVENTIL HFA;VENTOLIN HFA) 108 (90 Base) MCG/ACT inhaler Inhale 2 puffs into the lungs every 4 (four) hours as needed. 1 Inhaler 0  . ALPRAZolam (XANAX) 1 MG tablet Take 1 tablet (1 mg total) by mouth 3 (three) times daily. 90 tablet 2  . Cyanocobalamin (VITAMIN B 12 PO) Take by mouth daily.    Marland Kitchen ibuprofen (ADVIL,MOTRIN) 200 MG tablet Take 400 mg by mouth every 6 (six) hours as needed. Pain    . medroxyPROGESTERone (DEPO-PROVERA) 150 MG/ML injection INJECT 1 VIAL INTRAMUSCULARLY EVERY 3 MONTHS IN OFFICE. 1 mL 4  . amphetamine-dextroamphetamine (ADDERALL) 30 MG tablet Take 30 mg by mouth 2 (two) times daily. Reported on 01/06/2016    . nebivolol (BYSTOLIC) 10 MG tablet Take 1 tablet (10 mg total) by mouth daily. (Patient not taking: Reported on 01/06/2016) 30 tablet 6   No current facility-administered medications for this visit.    Discontinued Meds:   There are no discontinued medications.  Patient Active Problem List   Diagnosis Date Noted  . Abnormal uterine bleeding (AUB) 12/09/2014  . Depression 11/26/2014  . Dysmenorrhea 07/15/2014  . Irregular menstrual bleeding 07/15/2014  . Back pain, chronic 04/02/2013  .  Fatigue 12/24/2012  . Anxiety 12/24/2012  . PTSD (post-traumatic stress disorder) 12/24/2012  . ADD (attention deficit disorder) 12/24/2012  . Contraception 12/24/2012  . Hx of migraine headaches 12/24/2012    LABS    Component Value Date/Time   NA 139 12/22/2015 1017   NA 141 04/12/2014 1959   NA 139 01/07/2013 0949   NA 135 08/01/2010 1953   K 4.7 12/22/2015 1017   K 4.2 04/12/2014 1959   K 4.8 01/07/2013 0949   CL 100 12/22/2015 1017   CL 106 04/12/2014 1959   CL 103 01/07/2013 0949   CO2 23 12/22/2015 1017   CO2 23 04/12/2014 1959   CO2 27 01/07/2013 0949   GLUCOSE 92 12/22/2015 1017   GLUCOSE 95 04/12/2014 1959   GLUCOSE 98 01/07/2013 0949   GLUCOSE 149* 08/01/2010 1953   BUN 8 12/22/2015 1017   BUN 4* 04/12/2014 1959   BUN 7 01/07/2013 0949   BUN 10 08/01/2010 1953   CREATININE 0.81 12/22/2015 1017   CREATININE 0.62 04/12/2014 1959   CREATININE 0.83 01/07/2013 0949   CREATININE 0.86 08/01/2010 1953   CALCIUM 9.6 12/22/2015 1017   CALCIUM 9.0 04/12/2014 1959   CALCIUM 10.2 01/07/2013 0949   GFRNONAA 95 12/22/2015 1017   GFRNONAA >90 04/12/2014 [place] >60 08/01/2010 1953  GFRAA 110 12/22/2015 1017   GFRAA >90 04/12/2014 1959   GFRAA  08/01/2010 1953    >60        The eGFR has been calculated using the MDRD equation. This calculation has not been validated in all clinical situations. eGFR's persistently <60 mL/min signify possible Chronic Kidney Disease.   CMP     Component Value Date/Time   NA 139 12/22/2015 1017   NA 141 04/12/2014 1959   K 4.7 12/22/2015 1017   CL 100 12/22/2015 1017   CO2 23 12/22/2015 1017   GLUCOSE 92 12/22/2015 1017   GLUCOSE 95 04/12/2014 1959   BUN 8 12/22/2015 1017   BUN 4* 04/12/2014 1959   CREATININE 0.81 12/22/2015 1017   CREATININE 0.83 01/07/2013 0949   CALCIUM 9.6 12/22/2015 1017   PROT 6.5 04/12/2014 1959   ALBUMIN 3.8 04/12/2014 1959   AST 17 04/12/2014 1959   ALT 18 04/12/2014 1959   ALKPHOS 75  04/12/2014 1959   BILITOT 0.2* 04/12/2014 1959   GFRNONAA 95 12/22/2015 1017   GFRAA 110 12/22/2015 1017       Component Value Date/Time   WBC 8.9 12/22/2015 1017   WBC 10.5 04/12/2014 1959   WBC 10.2 01/07/2013 0949   WBC 10.5 08/01/2010 1953   HGB 13.5 04/12/2014 1959   HGB 14.9 01/07/2013 0949   HGB 13.9 08/01/2010 1953   HCT 43.1 12/22/2015 1017   HCT 38.3 04/12/2014 1959   HCT 43.9 01/07/2013 0949   HCT 40.0 08/01/2010 1953   MCV 90 12/22/2015 1017   MCV 89.5 04/12/2014 1959   MCV 88.7 01/07/2013 0949   MCV 94.1 08/01/2010 1953    Lipid Panel     Component Value Date/Time   CHOL 176 01/07/2013 0949   TRIG 157* 01/07/2013 0949   HDL 55 01/07/2013 0949   CHOLHDL 3.2 01/07/2013 0949   VLDL 31 01/07/2013 0949   LDLCALC 90 01/07/2013 0949    ABG No results found for: PHART, PCO2ART, PO2ART, HCO3, TCO2, ACIDBASEDEF, O2SAT   Lab Results  Component Value Date   TSH 1.520 12/22/2015   BNP (last 3 results)  Recent Labs  12/22/15 1017  BNP <2.5    ProBNP (last 3 results) No results for input(s): PROBNP in the last 8760 hours.  Cardiac Panel (last 3 results) No results for input(s): CKTOTAL, CKMB, TROPONINI, RELINDX in the last 72 hours.  Iron/TIBC/Ferritin/ %Sat No results found for: IRON, TIBC, FERRITIN, IRONPCTSAT   EKG Orders placed or performed in visit on 01/06/16  . EKG 12-Lead     Prior Assessment and Plan Problem List as of 01/06/2016      Genitourinary   Dysmenorrhea   Abnormal uterine bleeding (AUB)     Other   Fatigue   Anxiety   PTSD (post-traumatic stress disorder)   ADD (attention deficit disorder)   Contraception   Hx of migraine headaches   Back pain, chronic   Irregular menstrual bleeding   Depression       Imaging: Dg Chest 2 View  12/21/2015  CLINICAL DATA:  Productive cough and body aches for 1 month. Smoker. EXAM: CHEST  2 VIEW COMPARISON:  04/12/2014 FINDINGS: The cardiomediastinal silhouette is within normal limits.  The lungs are well inflated and clear. There is no evidence of pleural effusion or pneumothorax. No acute osseous abnormality is identified. IMPRESSION: No active cardiopulmonary disease. Electronically Signed   By: Logan Bores M.D.   On: 12/21/2015 18:28

## 2016-01-06 NOTE — Patient Instructions (Signed)
Your physician recommends that you schedule a follow-up appointment with Dr. Harl Bowie on May 16   Your physician has recommended you make the following change in your medication:   START BYSTOLIC 5 mg Daily   Your physician has requested that you regularly monitor and record your blood pressure readings at home. Please use the same machine at the same time of day to check your readings and record them to bring to your follow-up visit.  Your physician recommends that you schedule a follow-up appointment for a blood pressure check for one week.   If you need a refill on your cardiac medications before your next appointment, please call your pharmacy.  Thank you for choosing [facility]!
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
## 2016-01-06 NOTE — Telephone Encounter (Signed)
Her forms for disability was filled out by myself. Please make sure that the patient is aware that this statement takes her out through April 24. It is impossible for me to predict when she can return to work. I am hoping April 25. If the patient feels that she needs to be out longer than that due to her heart she will need to have a cardiologist fill out further disability forms. If she feels that she needs to be out of work due to depression she needs to have Dr. Ross-psychiatry fill out the form thank you

## 2016-01-07 ENCOUNTER — Encounter: Payer: Self-pay | Admitting: Family Medicine

## 2016-01-07 NOTE — Telephone Encounter (Signed)
Danae Chen completes this paperwork.  Please advise.

## 2016-01-13 ENCOUNTER — Ambulatory Visit (INDEPENDENT_AMBULATORY_CARE_PROVIDER_SITE_OTHER): Payer: 59 | Admitting: *Deleted

## 2016-01-13 VITALS — BP 118/72 | HR 96

## 2016-01-13 DIAGNOSIS — Z79899 Other long term (current) drug therapy: Secondary | ICD-10-CM

## 2016-01-13 LAB — CATECHOLAMINES, FRACTIONATED, URINE, 24 HOUR
DOPAMINE 24H UR: 394 ug/(24.h) (ref 0–510)
Dopamine, Rand Ur: 272 ug/L
Epinephrine, 24H Ur: 28 ug/24 hr — ABNORMAL HIGH (ref 0–20)
Epinephrine, Rand Ur: 19 ug/L
NOREPINEPHRINE 24H UR: 73 ug/(24.h) (ref 0–135)
Norepinephrine, Rand Ur: 50 ug/L

## 2016-01-13 NOTE — Patient Instructions (Signed)
Pt denies any complaints at this time. Will route to DOD ( Dr. Harl Bowie and Jory Sims, NP)

## 2016-01-13 NOTE — Progress Notes (Signed)
BP is labile but WNL. Heart rate is not controlled. Do not want to go up on BB. She will need to follow up with Dr. Johnsie Cancel for his recommendations for EP evaluation.

## 2016-01-17 ENCOUNTER — Telehealth: Payer: Self-pay | Admitting: *Deleted

## 2016-01-17 DIAGNOSIS — R000 Tachycardia, unspecified: Secondary | ICD-10-CM

## 2016-01-17 MED ORDER — NEBIVOLOL HCL 10 MG PO TABS: 10.0000 mg | ORAL_TABLET | Freq: Every day | ORAL | Status: DC

## 2016-01-17 NOTE — Telephone Encounter (Signed)
-----   Message from Josue Hector, MD sent at 01/17/2016  7:41 AM EDT ----- Yes increase bystolic to 10 mg see GT in [place] for tachycardia  ----- Message -----    From: Levonne Hubert, LPN    Sent: [id number]   7:39 AM      To: Josue Hector, MD    ----- Message -----    From: Levonne Hubert, LPN    Sent: [id number]   4:13 PM      To: Lendon Colonel, NP

## 2016-01-17 NOTE — Telephone Encounter (Signed)
-----   Message from Lendon Colonel, NP sent at 01/13/2016  4:47 PM EDT -----   ----- Message -----    From: Levonne Hubert, LPN    Sent: [id number]   4:13 PM      To: Lendon Colonel, NP

## 2016-01-17 NOTE — Telephone Encounter (Signed)
Pt notified of changes.

## 2016-01-17 NOTE — Telephone Encounter (Signed)
-----   Message from Josue Hector, MD sent at 01/17/2016  7:41 AM EDT ----- Yes increase bystolic to 10 mg see GT in [place] for tachycardia  ----- Message -----    From: Levonne Hubert, LPN    Sent: [id number]   7:39 AM      To: Josue Hector, MD    ----- Message -----    From: Levonne Hubert, LPN    Sent: [id number]   4:13 PM      To: Lendon Colonel, NP

## 2016-01-18 ENCOUNTER — Telehealth: Payer: Self-pay

## 2016-01-18 NOTE — Telephone Encounter (Signed)
Patient would like the results to her recent 24 hour urine testing.

## 2016-01-20 ENCOUNTER — Encounter: Payer: Self-pay | Admitting: *Deleted

## 2016-01-20 NOTE — Telephone Encounter (Signed)
Called patient back about a work note. Will send request for work note to Dr. Johnsie Cancel and Jory Sims NP, since she saw the patient last. Patient is suppose to return to work Monday evening 01/24/16.

## 2016-01-20 NOTE — Telephone Encounter (Signed)
Destiny Orr is calling because she is needing a return to work note w/ no restrictions. She is schedule to go back to work on Monday night , but was told that they(her Job) need to know that it is safe for her to be around the machines , She needing this note as soon as possible . Please call .Marland Kitchen Thanks

## 2016-01-21 ENCOUNTER — Telehealth: Payer: Self-pay | Admitting: Adult Health

## 2016-01-21 ENCOUNTER — Telehealth: Payer: Self-pay | Admitting: Family Medicine

## 2016-01-21 NOTE — Telephone Encounter (Signed)
Relayed Ms.Lawrence NP to her.She states she researched bystolic and feels it makes her worse.Ms.Lawrence indicated  this is what is controlling her heart rate

## 2016-01-21 NOTE — Telephone Encounter (Signed)
Please see PCP. She should follow up with them. She also is to keep appointment with EP

## 2016-01-21 NOTE — Telephone Encounter (Signed)
Letter was written yesterday for her to pick up at [facility]

## 2016-01-21 NOTE — Telephone Encounter (Signed)
Returned pt call, and she complains of being fatigue, dizzy & light headed. She stated she went to [facility] and had to squat down in floor 8 times because she felt as if she was going to pass out. She thinks it is the bystolic that is causing her symptoms. She checked her bp while in store and sitting it was 101/62 hr 92 & 94/730 hr 146. She does have an appoint with EP the first week in May. PLease advise.

## 2016-01-21 NOTE — Telephone Encounter (Signed)
Please call patient regarding medication change and side effects. / tg

## 2016-01-21 NOTE — Telephone Encounter (Signed)
Called pt to let her know her letter for work is waiting for her to pick up at the front desk.She let me know she was feeling bad and her mother would be picking it up.

## 2016-01-21 NOTE — Telephone Encounter (Signed)
Patient has a question regarding the nebivolol (BYSTOLIC) 10 MG tablet that she is taking.  She says her heart rate is staying lower, but when she stands up her heart rate shoots up and her blood pressure is dropping a little bit.  She has also being doing research on POTS syndrome and wants to know if she should see a specialist.

## 2016-01-24 ENCOUNTER — Ambulatory Visit (INDEPENDENT_AMBULATORY_CARE_PROVIDER_SITE_OTHER): Payer: 59 | Admitting: Family Medicine

## 2016-01-24 ENCOUNTER — Encounter: Payer: Self-pay | Admitting: Family Medicine

## 2016-01-24 ENCOUNTER — Telehealth: Payer: Self-pay | Admitting: Family Medicine

## 2016-01-24 VITALS — BP 108/74 | Ht 64.5 in | Wt 144.5 lb

## 2016-01-24 DIAGNOSIS — R000 Tachycardia, unspecified: Secondary | ICD-10-CM

## 2016-01-24 DIAGNOSIS — R5383 Other fatigue: Secondary | ICD-10-CM

## 2016-01-24 LAB — METANEPHRINES, URINE, 24 HOUR

## 2016-01-24 NOTE — Telephone Encounter (Signed)
I spoke with cardiology and they are addressing this. They are going to set up a referral to evaluate for POTS.

## 2016-01-24 NOTE — Telephone Encounter (Signed)
Notified patient Destiny Orr spoke with cardiology and they are addressing this. They are going to set up a referral to evaluate for POTS. Patient verbalized understanding.

## 2016-01-24 NOTE — Telephone Encounter (Signed)
Patient was released from Cardiology to return to work. Patient states that her occupational health nurse at work does not think she should return to work. Patient would like to know if you believe it is ok for her to return to work on 01/24/16

## 2016-01-24 NOTE — Telephone Encounter (Signed)
Yes ok 

## 2016-01-24 NOTE — Progress Notes (Signed)
   Subjective:    Patient ID: Destiny Orr, female    DOB: 1980/10/31, 35 y.o.   MRN: [id number]  HPI Patient in today for follow up for from cardiology.  Patient is currently seeing Dr.Nishan at Cardiology and was prescribed Bystolic.   Please see prior notes. Patient has had a rather difficult past few months.  Notes generalized fatigue and tiredness.  Has gone through  separation then divorce, though claims no major difficulties from a mental standpoint with this. Next  Patient has long history of tachycardia. This is been worse recently. Was seen by cardiologist. They did echocardiogram and Holter monitor. This revealed substantial tachycardia but no true SVTs. Echo good.  On by systolic 5 mg patient still had a very rapid heart rate response. But also had apparently some blood pressure drop with change of position. Next  Dose was changed to 10 mg. Patient feels more fatigued on this.  Not exercising these days claims no depressed thoughts   Review of Systems No headache no chest pain no loss of consciousness though positive orthostatic symptoms    Objective:   Physical Exam  Alert vitals stable HEENT normal lungs clear. Heart regular in rhythm. Blood pressure 124/87 on repeat neuro exam intact      Assessment & Plan:   Impression 1 fatigue likely multi-factorial the patient Was all likely related to #2 #2 tachyarrhythmia. Although other features which sound unfortunately like POTS. Patient due to see cardiac rhythm specialist shortly #3 substantial social stress with patient claiming no contributing to #1 plan maintain meds same for now. May return to work. Diet exercise discussed 25 minutes spent most in discussion and review of prior notes, especially notes and specialty tests [facility]

## 2016-01-25 NOTE — Telephone Encounter (Signed)
Patient was notified.

## 2016-02-01 ENCOUNTER — Ambulatory Visit (INDEPENDENT_AMBULATORY_CARE_PROVIDER_SITE_OTHER): Payer: 59 | Admitting: Internal Medicine

## 2016-02-01 ENCOUNTER — Encounter: Payer: Self-pay | Admitting: Cardiovascular Disease

## 2016-02-01 ENCOUNTER — Encounter: Payer: Self-pay | Admitting: Internal Medicine

## 2016-02-01 VITALS — BP 138/94 | HR 140 | Ht 65.0 in | Wt 144.2 lb

## 2016-02-01 DIAGNOSIS — I951 Orthostatic hypotension: Secondary | ICD-10-CM | POA: Diagnosis not present

## 2016-02-01 DIAGNOSIS — R000 Tachycardia, unspecified: Secondary | ICD-10-CM

## 2016-02-01 DIAGNOSIS — G90A Postural orthostatic tachycardia syndrome (POTS): Secondary | ICD-10-CM

## 2016-02-01 NOTE — Patient Instructions (Signed)
Medication Instructions:  Your physician recommends that you continue on your current medications as directed. Please refer to the Current Medication list given to you today.   Labwork: None ordered   Testing/Procedures: None ordered   Follow-Up: Your physician recommends that you schedule a follow-up appointment in: 3 months with Dr Lovena Le in [place]   Any Other Special Instructions Will Be Listed Below (If Applicable).     If you need a refill on your cardiac medications before your next appointment, please call your pharmacy.

## 2016-02-01 NOTE — Progress Notes (Signed)
HPI Destiny Orr is referred today for evaluation of tachypalpitations. She is a pleasant 35 yo woman with heart racing. She notes trouble with PTSD, depression and ADHD. She has had documented HR's at rest in the 130's. No sustained SVT has been documented. She was treated with Bystolic by Dr. Johnsie Cancel and she thinks that this mediction has helped some. She has been limited by low blood pressure. She still works regularly and notes that she had shifts where she is on her feet for 12 hours. She notes that her palpitations are worse with upright posture. She wore a cardiac monitor which demonstrated NSR and Sinus tachycardia but no SVT. Allergies  Allergen Reactions  . Lexapro [Escitalopram Oxalate] Other (See Comments)    Flat affect, No emotions     Current Outpatient Prescriptions  Medication Sig Dispense Refill  . ALPRAZolam (XANAX) 1 MG tablet Take 1 tablet (1 mg total) by mouth 3 (three) times daily. 90 tablet 2  . Cyanocobalamin (VITAMIN B 12 PO) Take 1 tablet by mouth daily.    Marland Kitchen ibuprofen (ADVIL,MOTRIN) 200 MG tablet Take 400 mg by mouth every 6 (six) hours as needed. Pain    . medroxyPROGESTERone (DEPO-PROVERA) 150 MG/ML injection INJECT 1 VIAL INTRAMUSCULARLY EVERY 3 MONTHS IN OFFICE. 1 mL 4  . nebivolol (BYSTOLIC) 10 MG tablet Take 1 tablet (10 mg total) by mouth daily. 30 tablet 6   No current facility-administered medications for this visit.     Past Medical History  Diagnosis Date  . Anxiety   . Depression   . Pneumothorax, spontaneous, tension   . PTSD (post-traumatic stress disorder)   . Arthritis   . DJD (degenerative joint disease)   . Scoliosis   . Fatigue 12/24/2012  . Hx of migraine headaches 12/24/2012  . Dysmenorrhea 07/15/2014  . Irregular menstrual bleeding 07/15/2014  . Abnormal uterine bleeding (AUB) 12/09/2014  . Headache   . Thyroid disease   . Fibromyalgia diagnosed April 2016    ROS:   All systems reviewed and negative except as noted in the  HPI.   Past Surgical History  Procedure Laterality Date  . Knee surgery    . Cesarean section    . Bone spurs toes Right      Family History  Problem Relation Age of Onset  . Hypertension Father   . Atrial fibrillation Father   . Alcohol abuse Father   . Cancer Maternal Grandmother     skin   . Heart disease Maternal Grandmother   . Other Maternal Grandmother     had thyroid removed  . Heart disease Paternal Grandfather   . COPD Paternal Grandfather   . Hypertension Paternal Grandfather   . Diabetes Paternal Grandfather   . Stroke Paternal Grandfather   . Anxiety disorder Mother   . Anxiety disorder Maternal Aunt   . Anxiety disorder Maternal Uncle   . Bipolar disorder Maternal Uncle      Social History   Social History  . Marital Status: Married    Spouse Name: N/A  . Number of Children: N/A  . Years of Education: N/A   Occupational History  . Not on file.   Social History Main Topics  . Smoking status: Current Every Day Smoker -- 1.50 packs/day for 15 years    Types: Cigarettes    Start date: 03/13/1997  . Smokeless tobacco: Never Used     Comment: "working on it" Was smoking 2.5 packs a day  . Alcohol  Use: Yes     Comment: occ. mixed drink once or twice a year   . Drug Use: No  . Sexual Activity:    Partners: Male    Birth Control/ Protection: Injection   Other Topics Concern  . Not on file   Social History Narrative     BP 138/94 mmHg  Pulse 140  Ht 5\' 5"  (1.651 m)  Wt 144 lb 3.2 oz (65.409 kg)  BMI 24.00 kg/m2  Physical Exam:  Well appearing 35 yo woman, NAD HEENT: Unremarkable Neck:  No JVD, no thyromegally Lymphatics:  No adenopathy Back:  No CVA tenderness Lungs:  Clear with no wheezes HEART:  Regular tachy rhythm, no murmurs, no rubs, no clicks Abd:  soft, positive bowel sounds, no organomegally, no rebound, no guarding Ext:  2 plus pulses, no edema, no cyanosis, no clubbing Skin:  No rashes no nodules Neuro:  CN II through  XII intact, motor grossly intact  EKG - sinus tachycardia at 130/min  Assess/Plan: 1. Autonomic dysfunction - the patient most likely has the POTS syndrome. I have discussed the benign nature of her condition, and the importance of maintaining regular daily exercise. Also she is encouraged to increase her sodium intake. She is instructed to avoid caffeine. We will see her back in 3-4 months.  2. Anxiety-depression - she takes some benzos. She is encourage to seek help as needed.

## 2016-02-02 ENCOUNTER — Telehealth: Payer: Self-pay | Admitting: Internal Medicine

## 2016-02-02 NOTE — Telephone Encounter (Signed)
Patient may return to work with no restrictions.  Please call the office with questions.

## 2016-02-02 NOTE — Telephone Encounter (Signed)
New MEssage  Pt stated that in order to return to work (pt was hoping to begin again tonight 5/3) she needs a letter from Dr Lovena Le faxed to her employer- [phone]. Please advise.

## 2016-02-03 ENCOUNTER — Telehealth: Payer: Self-pay | Admitting: Internal Medicine

## 2016-02-03 ENCOUNTER — Telehealth: Payer: Self-pay | Admitting: Adult Health

## 2016-02-03 NOTE — Telephone Encounter (Signed)
Per pt call:  Pt has been seeing spots and feeling weak for the pass month this has not gotten any better.  Pt can not even stand to take a shower.  Pt would like a call back on what to do please.

## 2016-02-03 NOTE — Telephone Encounter (Signed)
Returning Kisha's call  

## 2016-02-03 NOTE — Telephone Encounter (Signed)
Phone call reviewed with Dr. Lovena Le. Patient to discontinue Bystolic and call back in a week to update [place] on how she is feeling.

## 2016-02-04 ENCOUNTER — Ambulatory Visit (INDEPENDENT_AMBULATORY_CARE_PROVIDER_SITE_OTHER): Payer: 59 | Admitting: Nurse Practitioner

## 2016-02-04 ENCOUNTER — Encounter: Payer: Self-pay | Admitting: Nurse Practitioner

## 2016-02-04 ENCOUNTER — Encounter: Payer: Self-pay | Admitting: Family Medicine

## 2016-02-04 ENCOUNTER — Telehealth: Payer: Self-pay

## 2016-02-04 VITALS — BP 120/70 | Temp 98.9°F | Ht 64.5 in | Wt 148.0 lb

## 2016-02-04 DIAGNOSIS — G90A Postural orthostatic tachycardia syndrome (POTS): Secondary | ICD-10-CM

## 2016-02-04 DIAGNOSIS — R55 Syncope and collapse: Secondary | ICD-10-CM

## 2016-02-04 DIAGNOSIS — I951 Orthostatic hypotension: Secondary | ICD-10-CM

## 2016-02-04 DIAGNOSIS — R000 Tachycardia, unspecified: Secondary | ICD-10-CM

## 2016-02-04 NOTE — Telephone Encounter (Signed)
Per Dr Scotty Court placed to Dr Mare Ferrari at [facility] for consult for syncope,pt notified

## 2016-02-04 NOTE — Addendum Note (Signed)
Addended by: Freada Bergeron on: 02/04/2016 10:47 AM   Modules accepted: Orders

## 2016-02-05 ENCOUNTER — Encounter: Payer: Self-pay | Admitting: Nurse Practitioner

## 2016-02-05 DIAGNOSIS — R000 Tachycardia, unspecified: Principal | ICD-10-CM

## 2016-02-05 DIAGNOSIS — I951 Orthostatic hypotension: Principal | ICD-10-CM

## 2016-02-05 DIAGNOSIS — G90A Postural orthostatic tachycardia syndrome (POTS): Secondary | ICD-10-CM | POA: Insufficient documentation

## 2016-02-05 NOTE — Progress Notes (Signed)
Subjective:  Presents for follow-up see previous notes. Has now seen a cardiac specialist who has diagnosed her with POTS. Has stopped her last systolic per advice of cardiology. Has been referred to [facility] to another specialist although she has been told waiting list can be months even up to a year. Cardiology is going to try to see if they can get her in sooner. Has been "taking it easy" for advice of all of her providers. Is trying to get back into walking in regular exercise but it has been a struggle. 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 Due to sudden dizziness, her employer states that she cannot work her current job due to safety issues. Was released to go to work, try to work Wednesday night stopped at a convenience store and got some Gatorade to help increase her sodium she was directed, got to work and had to sit in the car for prolonged period of time before she could stand up and walk. States her legs felt heavy like noodles. Was late going into work waiting on symptoms to subside. At that time she was told it was unsafe for her to be around equipment and machinery due to potential dizziness. Note the patient does drink some caffeine.  Objective:   BP 120/70 mmHg  Temp(Src) 98.9 F (37.2 C) (Oral)  Ht 5' 4.5" (1.638 m)  Wt 148 lb (67.132 kg)  BMI 25.02 kg/m2 NAD. Alert, oriented. Mildly anxious affect. Lungs clear. Heart regular rate rhythm.  Assessment:  Problem List Items Addressed This Visit      Cardiovascular and Mediastinum   POTS (postural orthostatic tachycardia syndrome) - Primary     Plan: Encourage patient to slowly increase her activity and to increase her fluid and sodium intake, avoid any dehydration. Wean off caffeine. Discussed importance of reducing her anxiety and stress reduction. Encouraged healthy diet and adequate rest. Given work note for 4/24-5/31. Patient to follow-up for this note expires for a visit to recheck. [facility] referral is being done.

## 2016-02-07 ENCOUNTER — Telehealth: Payer: Self-pay | Admitting: Adult Health

## 2016-02-07 NOTE — Telephone Encounter (Signed)
Returned pt call. She stated that she had stopped the Bystolic like she was instructed and now feels worse than before. She stated her bp and hr is staying up a lot higher now. She says her readings on bystolic were [id number] hr 0000000 , 94/66 hr 80's. Off the bystolic have been 0000000 hr 160 , 126/125 hr 127. She would like to start taking the bystolic again. PLease advise.

## 2016-02-07 NOTE — Telephone Encounter (Signed)
Please call regarding medication concerns  tg

## 2016-02-08 ENCOUNTER — Telehealth: Payer: Self-pay | Admitting: *Deleted

## 2016-02-08 NOTE — Telephone Encounter (Signed)
Pt is calling back in to figure out what she should do about her medications and she also stated that her disability company voiced that the paperwork regarding her disability is incomplete and have yet to receive the office notes from the 5/2 visit. Please f/u with her .

## 2016-02-08 NOTE — Telephone Encounter (Signed)
Discussed with Dr Lovena Le may restart her Bystolic at same dose as previously taking

## 2016-02-08 NOTE — Telephone Encounter (Signed)
Patient thought she was going to have blood work ordered

## 2016-02-09 DIAGNOSIS — Z029 Encounter for administrative examinations, unspecified: Secondary | ICD-10-CM

## 2016-02-09 NOTE — Telephone Encounter (Signed)
Follow Up  Pt is requesting call back to discuss the medication Bystolic. She states that she hasn't received a call back. She states that she is worried.Please call

## 2016-02-10 NOTE — Telephone Encounter (Signed)
Pt returned my call. I let her know Dr. Lovena Le said it was ok to start taking the bystolic at the previous dose. I also got the name/number of the person that needed the office note from 5/2 visit with Dr. Lovena Le. [facility] [phone] ) Loann QuillDalene Seltzer., R.N.- FAXED OFFICE NOTE 5/11

## 2016-02-10 NOTE — Telephone Encounter (Signed)
Called pt. Got voicemail- left message to call me back.

## 2016-02-11 NOTE — Telephone Encounter (Signed)
I reviewed her chart. No further labs as this time. Hopefully referral with [facility] will come through soon.

## 2016-02-11 NOTE — Telephone Encounter (Signed)
Patient advised that [name] reviewed her chart. No further labs as this time. Hopefully referral with [facility] will come through soon. Patient verbalized understanding.

## 2016-02-14 ENCOUNTER — Telehealth: Payer: Self-pay | Admitting: Internal Medicine

## 2016-02-14 DIAGNOSIS — R000 Tachycardia, unspecified: Secondary | ICD-10-CM

## 2016-02-14 DIAGNOSIS — I951 Orthostatic hypotension: Secondary | ICD-10-CM

## 2016-02-14 DIAGNOSIS — R55 Syncope and collapse: Secondary | ICD-10-CM

## 2016-02-14 DIAGNOSIS — G90A Postural orthostatic tachycardia syndrome (POTS): Secondary | ICD-10-CM

## 2016-02-14 NOTE — Telephone Encounter (Signed)
Ref placed as requested,pt aware

## 2016-02-14 NOTE — Telephone Encounter (Signed)
Pt is being referred to Dr. Rip Harbour at [facility] by Dr. Lovena Le for POTS -not taking any new patients due to her schedule be booked for a year-she is referring to Dr. [facility] Salvia in [place]  pls call if can be referred [phone]

## 2016-02-15 ENCOUNTER — Ambulatory Visit (INDEPENDENT_AMBULATORY_CARE_PROVIDER_SITE_OTHER): Payer: 59 | Admitting: Cardiology

## 2016-02-15 ENCOUNTER — Encounter: Payer: Self-pay | Admitting: Cardiology

## 2016-02-15 VITALS — BP 125/82 | HR 73 | Ht 65.0 in | Wt 151.0 lb

## 2016-02-15 DIAGNOSIS — I951 Orthostatic hypotension: Secondary | ICD-10-CM

## 2016-02-15 DIAGNOSIS — R002 Palpitations: Secondary | ICD-10-CM

## 2016-02-15 DIAGNOSIS — G90A Postural orthostatic tachycardia syndrome (POTS): Secondary | ICD-10-CM

## 2016-02-15 DIAGNOSIS — R000 Tachycardia, unspecified: Secondary | ICD-10-CM

## 2016-02-15 NOTE — Patient Instructions (Signed)
Medication Instructions:  Your physician recommends that you continue on your current medications as directed. Please refer to the Current Medication list given to you today.   Labwork: NONE  Testing/Procedures: NONE  Follow-Up: Your physician recommends that you schedule a follow-up appointment in: 3 MONTHS    Any Other Special Instructions Will Be Listed Below (If Applicable).   I HAVE GIVEN YOU A PRESCRIPTION FOR KNEE- HI COMPRESSION STOCKINGS  I HAVE GIVEN YOU A WORK NOTE    If you need a refill on your cardiac medications before your next appointment, please call your pharmacy.

## 2016-02-15 NOTE — Progress Notes (Addendum)
Patient ID: TROI RIRIE, female   DOB: 05-12-1981, 35 y.o.   MRN: [id number]     Clinical Summary Ms. Lusby is a 35 y.o.female last seen by Dr Lovena Le, this is our first visit together. She is seen for the following medical problems.  1. Palpitations/POTS - followed by EP, suspected POTS syndrome. At last visit with Dr Lovena Le was encouarged to increased fluid and salt intake and follow symptoms - holter monitor without significant arrhythmias, did have some episodes of sinus tachycardia to 130s that was symptomatic.  - has f/u with EP in [place] in the next few weeks - restarted bystolic recently due to increased symptoms when stopped - tea 2 glasses, no sodas, drinking 3 bottles of water with electrolyte supplements.  - continues to have frequent symptoms.    Past Medical History  Diagnosis Date  . Anxiety   . Depression   . Pneumothorax, spontaneous, tension   . PTSD (post-traumatic stress disorder)   . Arthritis   . DJD (degenerative joint disease)   . Scoliosis   . Fatigue 12/24/2012  . Hx of migraine headaches 12/24/2012  . Dysmenorrhea 07/15/2014  . Irregular menstrual bleeding 07/15/2014  . Abnormal uterine bleeding (AUB) 12/09/2014  . Headache   . Thyroid disease   . Fibromyalgia diagnosed April 2016     Allergies  Allergen Reactions  . Lexapro [Escitalopram Oxalate] Other (See Comments)    Flat affect, No emotions     Current Outpatient Prescriptions  Medication Sig Dispense Refill  . ALPRAZolam (XANAX) 1 MG tablet Take 1 tablet (1 mg total) by mouth 3 (three) times daily. 90 tablet 2  . Cyanocobalamin (VITAMIN B 12 PO) Take 1 tablet by mouth daily.    Marland Kitchen ibuprofen (ADVIL,MOTRIN) 200 MG tablet Take 400 mg by mouth every 6 (six) hours as needed. Pain    . medroxyPROGESTERone (DEPO-PROVERA) 150 MG/ML injection INJECT 1 VIAL INTRAMUSCULARLY EVERY 3 MONTHS IN OFFICE. 1 mL 4   No current facility-administered medications for this visit.     Past Surgical History    Procedure Laterality Date  . Knee surgery    . Cesarean section    . Bone spurs toes Right      Allergies  Allergen Reactions  . Lexapro [Escitalopram Oxalate] Other (See Comments)    Flat affect, No emotions      Family History  Problem Relation Age of Onset  . Hypertension Father   . Atrial fibrillation Father   . Alcohol abuse Father   . Cancer Maternal Grandmother     skin   . Heart disease Maternal Grandmother   . Other Maternal Grandmother     had thyroid removed  . Heart disease Paternal Grandfather   . COPD Paternal Grandfather   . Hypertension Paternal Grandfather   . Diabetes Paternal Grandfather   . Stroke Paternal Grandfather   . Anxiety disorder Mother   . Anxiety disorder Maternal Aunt   . Anxiety disorder Maternal Uncle   . Bipolar disorder Maternal Uncle      Social History Ms. Crewse reports that she has been smoking Cigarettes.  She started smoking about 18 years ago. She has a 22.5 pack-year smoking history. She has never used smokeless tobacco. Ms. Vanhoozer reports that she drinks alcohol.   Review of Systems CONSTITUTIONAL: No weight loss, fever, chills, weakness or fatigue.  HEENT: Eyes: No visual loss, blurred vision, double vision or yellow sclerae.No hearing loss, sneezing, congestion, runny nose or sore throat.  SKIN: No  rash or itching.  CARDIOVASCULAR: per HPI RESPIRATORY: No shortness of breath, cough or sputum.  GASTROINTESTINAL: No anorexia, nausea, vomiting or diarrhea. No abdominal pain or blood.  GENITOURINARY: No burning on urination, no polyuria NEUROLOGICAL: No headache, dizziness, syncope, paralysis, ataxia, numbness or tingling in the extremities. No change in bowel or bladder control.  MUSCULOSKELETAL: No muscle, back pain, joint pain or stiffness.  LYMPHATICS: No enlarged nodes. No history of splenectomy.  PSYCHIATRIC: No history of depression or anxiety.  ENDOCRINOLOGIC: No reports of sweating, cold or heat intolerance. No  polyuria or polydipsia.  Marland Kitchen   Physical Examination Filed Vitals:   02/15/16 1444  BP: 125/82  Pulse: 73   Filed Vitals:   02/15/16 1444  Height: 5\' 5"  (1.651 m)  Weight: 151 lb (68.493 kg)    Gen: resting comfortably, no acute distress HEENT: no scleral icterus, pupils equal round and reactive, no palptable cervical adenopathy,  CV: RRR, no m/r/g, no jvd Resp: Clear to auscultation bilaterally GI: abdomen is soft, non-tender, non-distended, normal bowel sounds, no hepatosplenomegaly MSK: extremities are warm, no edema.  Skin: warm, no rash Neuro:  no focal deficits Psych: appropriate affect   Diagnostic Studies 12/2015 echo Study Conclusions  - Left ventricle: The cavity size was normal. Wall thickness was  normal. Systolic function was normal. The estimated ejection  fraction was in the range of 60% to 65%. Wall motion was normal;  there were no regional wall motion abnormalities. Left  ventricular diastolic function parameters were normal.  12/2015 Holter monitor 48 hour Holter monitor reviewed. Sinus rhythm and sinus tachycardia noted. Heart rate ranged from 56 bpm up to 143 beats bpm with average heart rate 90 bpm. No sustained arrhythmias. Rare PACs and PVCs/fusion beat. No pauses.       Assessment and Plan  1. Palpitatoins/POTS - continued symptoms with conservative measures of increased fluid and sodium intake - remains orthostatic in clinic, HR increases 20 beats with standing. Will prescribe compression stockings, she is asked to call us in a week with update on symptoms, if persistent then start florinef 0.1mg  daily and titrate as needed - she is to see an EP specialist at the end of the month in [place], [place], we will defer future management after she is seen.   F/u 3 months    Work note provided until May 30th, 2035  Arnoldo Lenis, M.D.

## 2016-02-17 ENCOUNTER — Ambulatory Visit (INDEPENDENT_AMBULATORY_CARE_PROVIDER_SITE_OTHER): Payer: 59 | Admitting: Psychiatry

## 2016-02-17 ENCOUNTER — Encounter (HOSPITAL_COMMUNITY): Payer: Self-pay | Admitting: Psychiatry

## 2016-02-17 ENCOUNTER — Telehealth: Payer: Self-pay | Admitting: Cardiology

## 2016-02-17 VITALS — BP 129/88 | HR 115 | Ht 65.0 in | Wt 152.0 lb

## 2016-02-17 DIAGNOSIS — F4310 Post-traumatic stress disorder, unspecified: Secondary | ICD-10-CM

## 2016-02-17 DIAGNOSIS — F411 Generalized anxiety disorder: Secondary | ICD-10-CM | POA: Diagnosis not present

## 2016-02-17 DIAGNOSIS — F332 Major depressive disorder, recurrent severe without psychotic features: Secondary | ICD-10-CM

## 2016-02-17 MED ORDER — ALPRAZOLAM 1 MG PO TABS: 1.0000 mg | ORAL_TABLET | Freq: Three times a day (TID) | ORAL | Status: DC
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
## 2016-02-17 NOTE — Telephone Encounter (Signed)
Patient would like to speak with nurse regarding medications. / tg  °

## 2016-02-17 NOTE — Telephone Encounter (Signed)
Pt would like to know if her records have been sent to EP Dr. In Destiny Orr

## 2016-03-03 ENCOUNTER — Ambulatory Visit (INDEPENDENT_AMBULATORY_CARE_PROVIDER_SITE_OTHER): Payer: 59 | Admitting: Adult Health

## 2016-03-03 ENCOUNTER — Other Ambulatory Visit: Payer: Self-pay | Admitting: *Deleted

## 2016-03-03 ENCOUNTER — Other Ambulatory Visit: Payer: Self-pay | Admitting: Adult Health

## 2016-03-03 ENCOUNTER — Encounter: Payer: Self-pay | Admitting: Adult Health

## 2016-03-03 VITALS — BP 122/60 | HR 78 | Ht 65.0 in | Wt 159.5 lb

## 2016-03-03 DIAGNOSIS — R102 Pelvic and perineal pain: Secondary | ICD-10-CM | POA: Diagnosis not present

## 2016-03-03 DIAGNOSIS — N926 Irregular menstruation, unspecified: Secondary | ICD-10-CM

## 2016-03-03 DIAGNOSIS — W57XXXA Bitten or stung by nonvenomous insect and other nonvenomous arthropods, initial encounter: Secondary | ICD-10-CM

## 2016-03-03 HISTORY — DX: Bitten or stung by nonvenomous insect and other nonvenomous arthropods, initial encounter: W57.XXXA

## 2016-03-03 HISTORY — DX: Pelvic and perineal pain: R10.2

## 2016-03-03 MED ORDER — HYDROCODONE-ACETAMINOPHEN 5-325 MG PO TABS: 1.0000 | ORAL_TABLET | Freq: Four times a day (QID) | ORAL | Status: DC | PRN

## 2016-03-03 NOTE — Progress Notes (Addendum)
Subjective:     Patient ID: Destiny Orr, female   DOB: Feb 20, 1981, 35 y.o.   MRN: [id number]  HPI Destiny Orr is a 35 year old white female, married in complaining of pelvic pain and spotting before depo due, next shot due 03/21/16.She describes pain as shooting and sharp then aches, has tried 800 mg motrin today without relief.Has been diagnosed with POTS.Has not had sex in about 1.5 years.She says she has gained weight in last 3 weeks, no bowel changes.  Review of Systems  +pelvic pain, sharp and shooting then aches,  +spotting before depo due, weight gain, she denies and bowel changes Reviewed past medical,surgical, social and family history. Reviewed medications and allergies.     Objective:   Physical Exam BP 122/60 mmHg  Pulse 78  Ht 5\' 5"  (1.651 m)  Wt 159 lb 8 oz (72.349 kg)  BMI 26.54 kg/m2  LMP 02/18/2016 (Approximate) Skin warm and dry, no rashes.Pelvic: external genitalia is normal in appearance no lesions, vagina: tan discharge without odor,urethra has no lesions or masses noted, cervix:smooth and bulbous, uterus: normal size, shape and contour, mildly tender, no masses felt, adnexa: no masses, mild LLQ tenderness noted. Bladder is non tender and no masses felt.  GC/CHL obtained. Abdomen soft and non tender. Had tick right thigh, easily removed intact.    Assessment:    Pelvic pain Irregular bleeding  Tick bite    Plan:    Return in 4 days for GYN [place] to assess pelvic pain GC/CHL sent Rx norco 5-325 mg #30 take 1 every 6 hours prn pain, no refills Mark calendar for tick bite Review handout on pelvic pain

## 2016-03-03 NOTE — Patient Instructions (Signed)
Pelvic Pain, Female Female pelvic pain can be caused by many different things and start from a variety of places. Pelvic pain refers to pain that is located in the lower half of the abdomen and between your hips. The pain may occur over a short period of time (acute) or may be reoccurring (chronic). The cause of pelvic pain may be related to disorders affecting the female reproductive organs (gynecologic), but it may also be related to the bladder, kidney stones, an intestinal complication, or muscle or skeletal problems. Getting help right away for pelvic pain is important, especially if there has been severe, sharp, or a sudden onset of unusual pain. It is also important to get help right away because some types of pelvic pain can be life threatening.  CAUSES  Below are only some of the causes of pelvic pain. The causes of pelvic pain can be in one of several categories.   Gynecologic.  Pelvic inflammatory disease.  Sexually transmitted infection.  Ovarian cyst or a twisted ovarian ligament (ovarian torsion).  Uterine lining that grows outside the uterus (endometriosis).  Fibroids, cysts, or tumors.  Ovulation.  Pregnancy.  Pregnancy that occurs outside the uterus (ectopic pregnancy).  Miscarriage.  Labor.  Abruption of the placenta or ruptured uterus.  Infection.  Uterine infection (endometritis).  Bladder infection.  Diverticulitis.  Miscarriage related to a uterine infection (septic abortion).  Bladder.  Inflammation of the bladder (cystitis).  Kidney stone(s).  Gastrointestinal.  Constipation.  Diverticulitis.  Neurologic.  Trauma.  Feeling pelvic pain because of mental or emotional causes (psychosomatic).  Cancers of the bowel or pelvis. EVALUATION  Your caregiver will want to take a careful history of your concerns. This includes recent changes in your health, a careful gynecologic history of your periods (menses), and a sexual history. Obtaining  your family history and medical history is also important. Your caregiver may suggest a pelvic exam. A pelvic exam will help identify the location and severity of the pain. It also helps in the evaluation of which organ system may be involved. In order to identify the cause of the pelvic pain and be properly treated, your caregiver may order tests. These tests may include:   A pregnancy test.  Pelvic ultrasonography.  An X-ray exam of the abdomen.  A urinalysis or evaluation of vaginal discharge.  Blood tests. HOME CARE INSTRUCTIONS   Only take over-the-counter or prescription medicines for pain, discomfort, or fever as directed by your caregiver.   Rest as directed by your caregiver.   Eat a balanced diet.   Drink enough fluids to make your urine clear or pale yellow, or as directed.   Avoid sexual intercourse if it causes pain.   Apply warm or cold compresses to the lower abdomen depending on which one helps the pain.   Avoid stressful situations.   Keep a journal of your pelvic pain. Write down when it started, where the pain is located, and if there are things that seem to be associated with the pain, such as food or your menstrual cycle.  Follow up with your caregiver as directed.  SEEK MEDICAL CARE IF:  Your medicine does not help your pain.  You have abnormal vaginal discharge. SEEK IMMEDIATE MEDICAL CARE IF:   You have heavy bleeding from the vagina.   Your pelvic pain increases.   You feel light-headed or faint.   You have chills.   You have pain with urination or blood in your urine.   You have uncontrolled  diarrhea or vomiting.   You have a fever or persistent symptoms for more than 3 days.  You have a fever and your symptoms suddenly get worse.   You are being physically or sexually abused.   This information is not intended to replace advice given to you by your health care provider. Make sure you discuss any questions you have with  your health care provider.   Document Released: 08/15/2004 Document Revised: 06/09/2015 Document Reviewed: 01/08/2012 Elsevier Interactive Patient Education [employer]. Return next week for [place]

## 2016-03-07 ENCOUNTER — Other Ambulatory Visit: Payer: 59

## 2016-03-07 LAB — GC/CHLAMYDIA PROBE AMP
CHLAMYDIA, DNA PROBE: NEGATIVE
Neisseria gonorrhoeae by PCR: NEGATIVE

## 2016-03-10 ENCOUNTER — Other Ambulatory Visit: Payer: 59

## 2016-03-16 ENCOUNTER — Encounter (HOSPITAL_COMMUNITY): Payer: Self-pay | Admitting: Psychiatry

## 2016-03-16 ENCOUNTER — Ambulatory Visit (INDEPENDENT_AMBULATORY_CARE_PROVIDER_SITE_OTHER): Payer: 59 | Admitting: Psychiatry

## 2016-03-16 VITALS — BP 132/90 | HR 92 | Ht 65.0 in | Wt 160.4 lb

## 2016-03-16 DIAGNOSIS — F411 Generalized anxiety disorder: Secondary | ICD-10-CM | POA: Diagnosis not present

## 2016-03-16 DIAGNOSIS — F4310 Post-traumatic stress disorder, unspecified: Secondary | ICD-10-CM

## 2016-03-16 DIAGNOSIS — F332 Major depressive disorder, recurrent severe without psychotic features: Secondary | ICD-10-CM | POA: Diagnosis not present

## 2016-03-16 MED ORDER — ESCITALOPRAM OXALATE 10 MG PO TABS: 10.0000 mg | ORAL_TABLET | Freq: Every day | ORAL | Status: DC
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
## 2016-03-21 ENCOUNTER — Ambulatory Visit: Payer: 59

## 2016-03-22 ENCOUNTER — Ambulatory Visit (INDEPENDENT_AMBULATORY_CARE_PROVIDER_SITE_OTHER): Payer: 59 | Admitting: *Deleted

## 2016-03-22 ENCOUNTER — Encounter: Payer: Self-pay | Admitting: *Deleted

## 2016-03-22 DIAGNOSIS — Z3042 Encounter for surveillance of injectable contraceptive: Secondary | ICD-10-CM | POA: Diagnosis not present

## 2016-03-22 DIAGNOSIS — Z3202 Encounter for pregnancy test, result negative: Secondary | ICD-10-CM | POA: Diagnosis not present

## 2016-03-22 LAB — POCT URINE PREGNANCY: Preg Test, Ur: NEGATIVE

## 2016-03-22 MED ORDER — MEDROXYPROGESTERONE ACETATE 150 MG/ML IM SUSP
150.0000 mg | Freq: Once | INTRAMUSCULAR | Status: AC
  Administered 2016-03-22: 150 mg via INTRAMUSCULAR

## 2016-03-22 NOTE — Progress Notes (Signed)
Pt here for Depo. Pt tolerated shot well. Return in 12 weeks for next shot. [name] 

## 2016-03-27 ENCOUNTER — Telehealth (HOSPITAL_COMMUNITY): Payer: Self-pay | Admitting: *Deleted

## 2016-03-27 NOTE — Telephone Encounter (Signed)
Called pt due to previous phone calls on Friday. Per pt, Dr. Harrington Challenger prescribed her Lexapro and its on her Allergy list. Per pt, Lexapro is on her allergy list because it gived her less emotions and did not care if she was died or alive. Per pt, when she was prescribed this medication during her last visit, she did not think anything of it until she looked at her AVS and saw it on her allergy list. Pt would like to know which other antidepressants can she go on because she's been on a lot of others and they did not work. Pt number is [phone].

## 2016-03-27 NOTE — Telephone Encounter (Signed)
voice message left on 03/24/16 at 1:40 p.m. and at 4:19 p.m.  regarding Lexapro.   She did not pick it up.   She said there is a note in system stating allergic reaction to Lexapro.   Need a phone call regarding this matter.

## 2016-03-28 NOTE — Telephone Encounter (Signed)
Please ask her to provide a list of ones that did not work

## 2016-03-28 NOTE — Telephone Encounter (Signed)
Called pt and informed her of what provider stated and she verbalized understanding.

## 2016-04-24 ENCOUNTER — Ambulatory Visit (HOSPITAL_COMMUNITY): Payer: Self-pay | Admitting: Psychiatry

## 2016-04-24 ENCOUNTER — Encounter (HOSPITAL_COMMUNITY): Payer: Self-pay

## 2016-04-26 ENCOUNTER — Encounter: Payer: Self-pay | Admitting: *Deleted

## 2016-05-03 DIAGNOSIS — Z0289 Encounter for other administrative examinations: Secondary | ICD-10-CM

## 2016-05-11 ENCOUNTER — Encounter: Payer: Self-pay | Admitting: Family Medicine

## 2016-05-11 ENCOUNTER — Ambulatory Visit: Payer: 59 | Admitting: Nurse Practitioner

## 2016-05-11 DIAGNOSIS — Z0289 Encounter for other administrative examinations: Secondary | ICD-10-CM

## 2016-05-15 ENCOUNTER — Other Ambulatory Visit: Payer: Self-pay

## 2016-05-16 ENCOUNTER — Ambulatory Visit: Payer: Self-pay | Admitting: Internal Medicine

## 2016-05-16 DIAGNOSIS — R0989 Other specified symptoms and signs involving the circulatory and respiratory systems: Secondary | ICD-10-CM

## 2016-05-17 ENCOUNTER — Encounter: Payer: Self-pay | Admitting: Internal Medicine

## 2016-05-18 ENCOUNTER — Encounter: Payer: Self-pay | Admitting: Adult Health

## 2016-05-18 ENCOUNTER — Ambulatory Visit: Payer: Self-pay | Admitting: Adult Health

## 2016-05-18 DIAGNOSIS — R0989 Other specified symptoms and signs involving the circulatory and respiratory systems: Secondary | ICD-10-CM

## 2016-05-18 NOTE — Progress Notes (Deleted)
Cardiology Office Note   Date:  05/18/2016   ID:  SERI TARPINIAN, DOB 1981-01-18, MRN [id number]  PCP:  Mickie Hillier, MD  Cardiologist: Cloria Spring, NP   No chief complaint on file.     History of Present Illness: Destiny Orr is a 35 y.o. female who presents for assessment and management of palpitations/POTS. She was last seen by Dr. Harl Bowie on 02/15/2016 after evaluation by Dr. Lovena Le for rapid heart rate and dizziness. A Holter monitor did not reveal any significant arrhythmias but she did have episodes of tachycardia 130 beats per minute which were symptomatic.Other hstory Includes Anxiety and Depression for which she Is being followed by Dr. Harrington Challenger at [facility].  On last office visit the patient was prescribed compression stockings and was to continue Florinef 0.1 mg daily and titrate as needed. She is seeing and an Ecologist in [place], [place] for further opinions.    Past Medical History:  Diagnosis Date  . Abnormal uterine bleeding (AUB) 12/09/2014  . Anxiety   . Arthritis   . Depression   . DJD (degenerative joint disease)   . Dysmenorrhea 07/15/2014  . Fatigue 12/24/2012  . Fibromyalgia diagnosed April 2016  . Headache   . Hx of migraine headaches 12/24/2012  . Inappropriate sinus tachycardia ([place])   . Irregular menstrual bleeding 07/15/2014  . Pelvic pain in female 03/03/2016  . Pneumothorax, spontaneous, tension   . POTS (postural orthostatic tachycardia syndrome)   . PTSD (post-traumatic stress disorder)   . Scoliosis   . Thyroid disease   . Tick bite 03/03/2016    Past Surgical History:  Procedure Laterality Date  . bone spurs toes Right   . CESAREAN SECTION    . KNEE SURGERY       Current Outpatient Prescriptions  Medication Sig Dispense Refill  . ALPRAZolam (XANAX) 1 MG tablet Take 1 tablet (1 mg total) by mouth 3 (three) times daily. 90 tablet 2  . Calcium Carbonate-Vit D-Min (CALCIUM 600+D PLUS MINERALS) 600-400  MG-UNIT TABS Take 1 tablet by mouth daily.     Destiny Orr 5 MG TABS tablet Take 5 mg by mouth 2 (two) times daily.    . Cyanocobalamin (VITAMIN B 12 PO) Take 1 tablet by mouth daily.    Marland Kitchen ibuprofen (ADVIL,MOTRIN) 200 MG tablet Take 400 mg by mouth daily as needed (pain).     . medroxyPROGESTERone (DEPO-PROVERA) 150 MG/ML injection INJECT 1 VIAL INTRAMUSCULARLY EVERY 3 MONTHS IN OFFICE. 1 mL 4   No current facility-administered medications for this visit.     Allergies:   Lexapro [escitalopram oxalate]    Social History:  The patient  reports that she has been smoking Cigarettes.  She started smoking about 19 years ago. She has a 15.00 pack-year smoking history. She has never used smokeless tobacco. She reports that she does not drink alcohol or use drugs.   Family History:  The patient's family history includes Alcohol abuse in her father; Anxiety disorder in her maternal aunt, maternal uncle, and mother; Atrial fibrillation in her father; Bipolar disorder in her maternal uncle; COPD in her paternal grandfather; Cancer in her maternal grandmother; Diabetes in her paternal grandfather; Heart disease in her maternal grandmother and paternal grandfather; Hypertension in her father and paternal grandfather; Other in her maternal grandmother; Stroke in her paternal grandfather.    ROS: All other systems are reviewed and negative. Unless otherwise mentioned in H&P    PHYSICAL EXAM: VS:  There were  no vitals taken for this visit. , BMI There is no height or weight on file to calculate BMI. GEN: Well nourished, well developed, in no acute distress  HEENT: normal  Neck: no JVD, carotid bruits, or masses Cardiac: ***RRR; no murmurs, rubs, or gallops,no edema  Respiratory:  clear to auscultation bilaterally, normal work of breathing GI: soft, nontender, nondistended, + BS MS: no deformity or atrophy  Skin: warm and dry, no rash Neuro:  Strength and sensation are intact Psych: euthymic mood, full  affect   EKG:  EKG {ACTION; IS/IS [id number] ordered today. The ekg ordered today demonstrates ***   Recent Labs: 12/22/2015: BNP <2.5; BUN 8; Creatinine, Ser 0.81; Platelets 340; Potassium 4.7; Sodium 139; TSH 1.520    Lipid Panel    Component Value Date/Time   CHOL 176 01/07/2013 0949   TRIG 157 (H) 01/07/2013 0949   HDL 55 01/07/2013 0949   CHOLHDL 3.2 01/07/2013 0949   VLDL 31 01/07/2013 0949   LDLCALC 90 01/07/2013 0949      Wt Readings from Last 3 Encounters:  03/03/16 159 lb 8 oz (72.3 kg)  02/15/16 151 lb (68.5 kg)  02/04/16 148 lb (67.1 kg)      Other studies Reviewed: Additional studies/ records that were reviewed today include: ***. Review of the above records demonstrates: ***   ASSESSMENT AND PLAN:  1.  ***   Current medicines are reviewed at length with the patient today.    Labs/ tests ordered today include: *** No orders of the defined types were placed in this encounter.    Disposition:   FU with *** in {gen number [id number] {TIME; UNITS DAY/WEEK/MONTH:19136}   Signed, Jory Sims, NP  05/18/2016 7:04 AM    [facility] [address] [place], [place] [address] Phone: [phone]; Fax: [phone]

## 2016-05-24 ENCOUNTER — Ambulatory Visit (INDEPENDENT_AMBULATORY_CARE_PROVIDER_SITE_OTHER): Payer: 59

## 2016-05-24 ENCOUNTER — Other Ambulatory Visit: Payer: Self-pay | Admitting: Adult Health

## 2016-05-24 DIAGNOSIS — N938 Other specified abnormal uterine and vaginal bleeding: Secondary | ICD-10-CM | POA: Diagnosis not present

## 2016-05-24 DIAGNOSIS — R102 Pelvic and perineal pain: Secondary | ICD-10-CM

## 2016-05-24 DIAGNOSIS — N854 Malposition of uterus: Secondary | ICD-10-CM | POA: Diagnosis not present

## 2016-05-24 DIAGNOSIS — N926 Irregular menstruation, unspecified: Secondary | ICD-10-CM

## 2016-05-24 DIAGNOSIS — N83292 Other ovarian cyst, left side: Secondary | ICD-10-CM

## 2016-05-24 NOTE — Progress Notes (Signed)
PELVIC US TA/TV: Homogeneous anteverted uterus wnl,EEC 1.8 mm, normal right ov,4.8 x 4.2 x 4 cm left ov cyst with a thin walled daughter cyst located w/in the larger cyst 2.5 x 2.3 x 2.4 cm(arterial and venous flow seen),small amount of cul de sac fluid,lt adnexal pain during ultrasound,ov's appear to be mobile.

## 2016-05-26 ENCOUNTER — Telehealth: Payer: Self-pay | Admitting: Adult Health

## 2016-05-26 NOTE — Telephone Encounter (Signed)
Pt aware US shows left ovarian cyst and she is having pain and bloated, make appt with Dr Elonda Husky to discuss removing

## 2016-05-29 ENCOUNTER — Telehealth: Payer: Self-pay | Admitting: Family Medicine

## 2016-05-29 NOTE — Telephone Encounter (Signed)
Pt called stating that [facility] did a b12 lab last week and her level was low. Pt is wanting to start b12 injections. Pt states that she has been taking over the counter vitamins but her body isnt absorbing them. Please advise.

## 2016-05-29 NOTE — Telephone Encounter (Signed)
Why did they do b12, what dose was she taking p o?

## 2016-05-29 NOTE — Telephone Encounter (Signed)
Vit B12 level was 146 on 8/22/178 at [facility] by specialist and wants our office to start injections because otc meds not helping her

## 2016-05-29 NOTE — Telephone Encounter (Signed)
Patient states it was drawn due to heart issues, tachycardia,  fatigue and dizziness- Patient seeing cardiology, neurology and GI. Patient taking 500iu vit b12 daily for years. Patient states the hematologist just drew blood work today to find out why she is not absorbing the vit B12. Patient states her specialist states she should have had b12 injections years ago for her levels and she has requested then and we refused and now they are saying she has some permanent damage and neuropathy from chronic low Vit b12 and she needs these injections. Her specialist will do them if we refuse again but she was not wanting to have to drive to [facility]

## 2016-05-30 ENCOUNTER — Telehealth: Payer: Self-pay | Admitting: Family Medicine

## 2016-05-30 NOTE — Telephone Encounter (Signed)
Left message on voicemail return call  

## 2016-05-30 NOTE — Telephone Encounter (Signed)
Tell pt I spoke with carediologist yest aft. I will need a final report from the hematologist before agreeing to take over the B12 shots, while coming to a final conclusion they can provide b12 shots down there if they think they are urgently needed. the last B12 we did here was normal and I would be very surprised if all her many different symptoms are coming from a borderline low b12.

## 2016-05-30 NOTE — Telephone Encounter (Signed)
FYI - Pt spoke with hematology @ [facility]   Was told that her B12 level should be around 500  Hers has been 200-260 since 2013  Recently has dropped as low as 146 on 05/22/16  Hematologist is waiting for recent test results to come in before ordering B12 injections  Once results are in, the hematologist will send [place] a letter with info needed

## 2016-05-30 NOTE — Telephone Encounter (Signed)
Notified patient Dr. Richardson Landry spoke with cardiologist yesterday afternoon. Dr. Richardson Landry will need a final report from the hematologist before agreeing to take over the B12 shots, while coming to a final conclusion they can provide b12 shots down there if they think they are urgently needed. the last B12 we did here was normal and Dr. Richardson Landry would be very surprised if all her many different symptoms are coming from a borderline low b12. Patient verbalized understanding.

## 2016-05-30 NOTE — Telephone Encounter (Signed)
Ok, no nneed to call pt again,

## 2016-05-31 ENCOUNTER — Encounter: Payer: Self-pay | Admitting: Obstetrics & Gynecology

## 2016-05-31 ENCOUNTER — Ambulatory Visit (INDEPENDENT_AMBULATORY_CARE_PROVIDER_SITE_OTHER): Payer: 59 | Admitting: Obstetrics & Gynecology

## 2016-05-31 VITALS — BP 110/70 | HR 76 | Ht 65.0 in | Wt 165.0 lb

## 2016-05-31 DIAGNOSIS — N938 Other specified abnormal uterine and vaginal bleeding: Secondary | ICD-10-CM

## 2016-05-31 DIAGNOSIS — E538 Deficiency of other specified B group vitamins: Secondary | ICD-10-CM | POA: Diagnosis not present

## 2016-05-31 DIAGNOSIS — N83202 Unspecified ovarian cyst, left side: Secondary | ICD-10-CM | POA: Diagnosis not present

## 2016-05-31 DIAGNOSIS — R140 Abdominal distension (gaseous): Secondary | ICD-10-CM | POA: Diagnosis not present

## 2016-05-31 MED ORDER — CYANOCOBALAMIN 1000 MCG/ML IJ SOLN: INTRAMUSCULAR | 11 refills | Status: DC

## 2016-05-31 MED ORDER — CYANOCOBALAMIN 1000 MCG/ML IJ SOLN
1000.0000 ug | Freq: Once | INTRAMUSCULAR | Status: AC
  Administered 2016-05-31: 1000 ug via INTRAMUSCULAR

## 2016-05-31 NOTE — Progress Notes (Signed)
Chief Complaint  Patient presents with  . pain and bloating    left ovarian cyst/ US done.    Blood pressure 110/70, pulse 76, height 5\' 5"  (1.651 m), weight 165 lb (74.8 kg).  35 y.o. [id number] No LMP recorded. The current method of family planning is Depo-Provera injections.  Subjective Patient is here to follow-up the finding of ovarian cyst and sonogram on 8/23  GYNECOLOGIC SONOGRAM   Gerene CATTLEYA NEVILL is a 35 y.o. [id number] unknown LMP she is her for a pelvic sonogram for LLQ pain,bloating, and irregular bleeding.  Uterus                      6.5 x 5.2 x 3.4 cm, wnl  Endometrium          1.8 mm, symmetrical, wnl  Right ovary             2.3 x 2.7 x 1.4 cm, wnl  Left ovary                5.4 x 4.3 x 4.7cm, 4.8 x 4.2 x 4 cm left ov cyst with a thin walled daughter cyst located w/in the larger cyst 2.5 x 2.3 x 2.4 cm(arterial and venous flow seen)  Small amount of cul de sac fluid,lt adnexal pain during ultrasound,ov's appear mobile.  Technician Comments:  PELVIC US TA/TV: Homogeneous anteverted uterus wnl,EEC 1.8 mm, normal right ov,4.8 x 4.2 x 4 cm left ov cyst with a thin walled daughter cyst located w/in the larger cyst 2.5 x 2.3 x 2.4 cm(arterial and venous flow seen),small amount of cul de sac fluid,lt adnexal pain during ultrasound,ov's appear to be mobile.    [name] 05/24/2016 12:03 PM     Is a 4.8 cm thin-walled left ovarian cyst on the left with a daughter cyst inside it's not really complex homogeneous and appears simple cystic  She has been having some lower pelvic pain may be left greater than right but mostly lower pelvic and general  Is SO concerned about her B-12 levels which had been dropping and I reviewed the B-12 levels from her visits in [place] and it does appear that she is now technically has a low B-12  We will arrange for her to have supplementation injections at her request  Objective   Pertinent ROS No burning  with urination, frequency or urgency No nausea, vomiting or diarrhea Nor fever chills or other constitutional symptoms   Labs or studies As above    Impression Diagnoses this Encounter::   ICD-9-CM ICD-10-CM   1. Cyst of left ovary 620.2 N83.202 US Pelvis Complete     US Transvaginal Non-OB  2. B12 deficiency 266.2 E53.8 cyanocobalamin ((VITAMIN B-12)) injection 1,000 mcg    Established relevant diagnosis(es):   Plan/Recommendations: Meds ordered this encounter  Medications  . metoprolol succinate (TOPROL-XL) 25 MG 24 hr tablet    Sig: Take 25 mg by mouth daily.  . Multiple Vitamins-Minerals (CENTRUM SILVER 50+WOMEN) TABS    Sig: Take by mouth daily.   Marland Kitchen DISCONTD: cyanocobalamin (,VITAMIN B-12,) 1000 MCG/ML injection    Sig: Inject 32ml monthly    Dispense:  1 mL    Refill:  11  . cyanocobalamin ((VITAMIN B-12)) injection 1,000 mcg    Labs or Scans Ordered: Orders Placed This Encounter  Procedures  . US Pelvis Complete  . US Transvaginal Non-OB    Management:: *B-12 supplementation here patient's request she would  rather have it done here than having to go to [facility] Repeat ultrasound in 3 months to track the left ovarian cyst if it does not resolve we may have to do a surgical removal as she is already on Depo-Provera and should be hormonally suppressing it  Follow up Return in about 3 months (around 08/31/2016) for GYN sono, Follow up, with Dr Elonda Husky.        Face to face time:  15 minutes  Greater than 50% of the visit time was spent in counseling and coordination of care with the patient.  The summary and outline of the counseling and care coordination is summarized in the note above.   All questions were answered.   Past Medical History:  Diagnosis Date  . Abnormal uterine bleeding (AUB) 12/09/2014  . Anxiety   . Arthritis   . Depression   . DJD (degenerative joint disease)   . Dysmenorrhea 07/15/2014  . Fatigue 12/24/2012  . Fibromyalgia  diagnosed April 2016  . Headache   . Hx of migraine headaches 12/24/2012  . Inappropriate sinus tachycardia ([place])   . Irregular menstrual bleeding 07/15/2014  . Pelvic pain in female 03/03/2016  . Pneumothorax, spontaneous, tension   . POTS (postural orthostatic tachycardia syndrome)   . PTSD (post-traumatic stress disorder)   . Scoliosis   . Thyroid disease   . Tick bite 03/03/2016  . Vitamin B 12 deficiency     Past Surgical History:  Procedure Laterality Date  . bone spurs toes Right   . CESAREAN SECTION    . KNEE SURGERY      OB History    Gravida Para Term Preterm AB Living   3 2 2   1 3    SAB TAB Ectopic Multiple Live Births         1 3      Allergies  Allergen Reactions  . Lexapro [Escitalopram Oxalate] Other (See Comments)    Flat affect, No emotions    Social History   Social History  . Marital status: Legally Separated    Spouse name: N/A  . Number of children: N/A  . Years of education: N/A   Social History Main Topics  . Smoking status: Current Every Day Smoker    Packs/day: 1.00    Years: 15.00    Types: Cigarettes    Start date: 03/13/1997  . Smokeless tobacco: Never Used     Comment: "working on it" Was smoking 2.5 packs a day  . Alcohol use No  . Drug use: No  . Sexual activity: Yes    Partners: Male    Birth control/ protection: Injection   Other Topics Concern  . None   Social History Narrative  . None    Family History  Problem Relation Age of Onset  . Hypertension Father   . Atrial fibrillation Father   . Alcohol abuse Father   . Cancer Maternal Grandmother     skin   . Heart disease Maternal Grandmother   . Other Maternal Grandmother     had thyroid removed  . Breast cancer Maternal Grandmother   . Heart disease Paternal Grandfather   . COPD Paternal Grandfather   . Hypertension Paternal Grandfather   . Diabetes Paternal Grandfather   . Stroke Paternal Grandfather   . Anxiety disorder Mother   . Cancer Maternal  Grandfather     bladder,lung  . Anxiety disorder Maternal Aunt   . Anxiety disorder Maternal Uncle   . Bipolar disorder Maternal  Uncle   . Breast cancer Maternal Uncle     CML  . Leukemia Other   . Colon cancer Other     lung-2 maternal great uncles

## 2016-06-06 ENCOUNTER — Telehealth: Payer: Self-pay | Admitting: Family Medicine

## 2016-06-06 ENCOUNTER — Telehealth: Payer: Self-pay | Admitting: Obstetrics & Gynecology

## 2016-06-06 ENCOUNTER — Telehealth (HOSPITAL_COMMUNITY): Payer: Self-pay | Admitting: *Deleted

## 2016-06-06 ENCOUNTER — Other Ambulatory Visit: Payer: Self-pay | Admitting: Obstetrics & Gynecology

## 2016-06-06 NOTE — Telephone Encounter (Signed)
NO way she can get a list of every medication she has been on.  want to switch to [place].

## 2016-06-06 NOTE — Telephone Encounter (Signed)
Pt requesting refill on Rx for B-12 e-scribed to [facility].

## 2016-06-06 NOTE — Telephone Encounter (Signed)
Done, thought I did refilss guess not

## 2016-06-06 NOTE — Telephone Encounter (Signed)
Pt states that she needs a refill of her B12 medication, pt states that she has an appointment tomorrow. Please contact pt

## 2016-06-06 NOTE — Telephone Encounter (Signed)
lmtcb and office number provided. Need to sch f/u for pt to see provider.

## 2016-06-06 NOTE — Telephone Encounter (Signed)
o v here with me next wk, will disc b 12 rx then also

## 2016-06-06 NOTE — Telephone Encounter (Signed)
Advised patient she would need office visit next week to discuss. Patient verbalized understanding and scheduled office visit and stated that the vit b12 situation has been handled-when the specialist saw how low it was they didn't want her to have to continue to wait and it has been taken care of and she doesn't need to discuss that it has been taken care of

## 2016-06-06 NOTE — Telephone Encounter (Signed)
Pt has thyroid nodule found about 2 years ago  Hard time swallowing & would like to be referred to endocrinology  (not Dr. Gearldine Shown related)  Had ultrasound before [name] ordered)   NTBS here again, maybe get new ultrasound, or refer?    Please advise

## 2016-06-07 ENCOUNTER — Ambulatory Visit (INDEPENDENT_AMBULATORY_CARE_PROVIDER_SITE_OTHER): Payer: 59 | Admitting: *Deleted

## 2016-06-07 ENCOUNTER — Ambulatory Visit (INDEPENDENT_AMBULATORY_CARE_PROVIDER_SITE_OTHER): Payer: 59 | Admitting: Psychiatry

## 2016-06-07 ENCOUNTER — Encounter (HOSPITAL_COMMUNITY): Payer: Self-pay | Admitting: Psychiatry

## 2016-06-07 ENCOUNTER — Encounter: Payer: Self-pay | Admitting: *Deleted

## 2016-06-07 VITALS — BP 110/70 | HR 95 | Ht 65.0 in | Wt 166.2 lb

## 2016-06-07 DIAGNOSIS — F332 Major depressive disorder, recurrent severe without psychotic features: Secondary | ICD-10-CM

## 2016-06-07 DIAGNOSIS — F4310 Post-traumatic stress disorder, unspecified: Secondary | ICD-10-CM | POA: Diagnosis not present

## 2016-06-07 DIAGNOSIS — E538 Deficiency of other specified B group vitamins: Secondary | ICD-10-CM

## 2016-06-07 DIAGNOSIS — F411 Generalized anxiety disorder: Secondary | ICD-10-CM

## 2016-06-07 MED ORDER — CLONAZEPAM 1 MG PO TABS: 1.0000 mg | ORAL_TABLET | Freq: Three times a day (TID) | ORAL | 2 refills | Status: DC

## 2016-06-07 MED ORDER — CYANOCOBALAMIN 1000 MCG/ML IJ SOLN
1000.0000 ug | Freq: Once | INTRAMUSCULAR | Status: AC
  Administered 2016-06-07: 1000 ug via INTRAMUSCULAR
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
## 2016-06-07 NOTE — Telephone Encounter (Signed)
Spoke with pt and she made appt for today

## 2016-06-07 NOTE — Patient Instructions (Signed)
Take latuda 20 mg with dinner for 2 weeks, then 40 mg

## 2016-06-07 NOTE — Progress Notes (Signed)
Pt here for Vit B 12 injection. Pt tolerated shot well. Return at scheduled visit on 06/12/16. [place]

## 2016-06-08 ENCOUNTER — Telehealth (HOSPITAL_COMMUNITY): Payer: Self-pay | Admitting: *Deleted

## 2016-06-08 NOTE — Telephone Encounter (Signed)
RETURNED [place].   DATES REQUESTED ARE NECESSARY IN ORDER TO PROCESS THE REQUEST FOR RECORDS.

## 2016-06-12 ENCOUNTER — Encounter: Payer: Self-pay | Admitting: Obstetrics & Gynecology

## 2016-06-12 ENCOUNTER — Ambulatory Visit (INDEPENDENT_AMBULATORY_CARE_PROVIDER_SITE_OTHER): Payer: 59 | Admitting: Obstetrics & Gynecology

## 2016-06-12 VITALS — BP 100/70 | HR 78 | Wt 167.0 lb

## 2016-06-12 DIAGNOSIS — O926 Galactorrhea: Secondary | ICD-10-CM

## 2016-06-12 DIAGNOSIS — M940 Chondrocostal junction syndrome [Tietze]: Secondary | ICD-10-CM | POA: Diagnosis not present

## 2016-06-12 DIAGNOSIS — N643 Galactorrhea not associated with childbirth: Secondary | ICD-10-CM

## 2016-06-12 DIAGNOSIS — N644 Mastodynia: Secondary | ICD-10-CM | POA: Diagnosis not present

## 2016-06-12 NOTE — Progress Notes (Signed)
      Chief Complaint  Patient presents with  . check lt breast    leaking and feel hot    Blood pressure 100/70, pulse 78, weight 167 lb (75.8 kg).  35 y.o. [id number] No LMP recorded. The current method of family planning is tubal ligation.  Subjective Pt has had a burning sensation in her left breast for a couple of weeks maybe a bit longer, comes and goes feels it with certain movements, has not felt any masses Also has some minimal occasional breast discharge clear to cloudy no blood  Objective +chest wall pain on exam, moving the breast around the pain remains jsut lateral to her bra line and also medial where the cartilage inserts, no breast masses or tenderness in the breast tissue itself No discharge is appreciated  Pertinent ROS No burning with urination, frequency or urgency No nausea, vomiting or diarrhea Nor fever chills or other constitutional symptoms   Labs or studies     Impression Diagnoses this Encounter::   ICD-9-CM ICD-10-CM   1. Costochondritis 733.6 M94.0   2. Galactorrhea 611.6 O92.6 Prolactin    Established relevant diagnosis(es): B12 deficiency, recently diagnosed  Plan/Recommendations: No orders of the defined types were placed in this encounter.   Labs or Scans Ordered: Orders Placed This Encounter  Procedures  . Prolactin    Management:: Check prolactin, if elevated check TSH No breast imaging indicated, not an inherent breast issue, no bloody discharge  Follow up Return if symptoms worsen or fail to improve.        Face to face time:  15 minutes  Greater than 50% of the visit time was spent in counseling and coordination of care with the patient.  The summary and outline of the counseling and care coordination is summarized in the note above.   All questions were answered. [place]

## 2016-06-13 ENCOUNTER — Ambulatory Visit (INDEPENDENT_AMBULATORY_CARE_PROVIDER_SITE_OTHER): Payer: 59 | Admitting: Family Medicine

## 2016-06-13 ENCOUNTER — Encounter: Payer: Self-pay | Admitting: Family Medicine

## 2016-06-13 VITALS — BP 110/72 | Ht 64.5 in | Wt 166.5 lb

## 2016-06-13 DIAGNOSIS — R000 Tachycardia, unspecified: Secondary | ICD-10-CM | POA: Diagnosis not present

## 2016-06-13 DIAGNOSIS — M79604 Pain in right leg: Secondary | ICD-10-CM | POA: Diagnosis not present

## 2016-06-13 DIAGNOSIS — F32A Depression, unspecified: Secondary | ICD-10-CM

## 2016-06-13 DIAGNOSIS — E041 Nontoxic single thyroid nodule: Secondary | ICD-10-CM | POA: Diagnosis not present

## 2016-06-13 DIAGNOSIS — I951 Orthostatic hypotension: Secondary | ICD-10-CM

## 2016-06-13 DIAGNOSIS — F329 Major depressive disorder, single episode, unspecified: Secondary | ICD-10-CM | POA: Diagnosis not present

## 2016-06-13 DIAGNOSIS — G90A Postural orthostatic tachycardia syndrome (POTS): Secondary | ICD-10-CM

## 2016-06-13 DIAGNOSIS — R5383 Other fatigue: Secondary | ICD-10-CM | POA: Diagnosis not present

## 2016-06-13 LAB — PROLACTIN: PROLACTIN: 13.2 ng/mL (ref 4.8–23.3)

## 2016-06-13 NOTE — Progress Notes (Signed)
   Subjective:    Patient ID: Destiny Orr, female    DOB: 1981-05-14, 35 y.o.   MRN: [id number]  HPI Patient  in today for a 1 year follow up on thyroid nodule. Review of old records reveals a very small thyroid nodule discovered approximately one and a half years ago. At that time its appearance on ultrasound led the radiologist not to recommend fine needle biopsy of the nodule. In fact ultrasound repeat in 12 months was encouraged only if felt to be clinically necessary.  Please see our prior notes. Patient has been off on quite an incredible workup also throughout the summer. She has seen multiple different specialists. She continues to have challenges with rapid heart rate. She's had a thorough cardiology workup. She is extremely frustrated cardiologist have not come up with an answer.  She continues to have strange feelings of numbness and pain in her legs and feet. Her neurologist is done nerve conduction studies. These returned completely normal. Patient is also extremely frustrated by this.  Patient is very frustrated with me. She looks means this last time I saw you said "this is all just depression".  Patient continues to work with her psychiatrist. Is on yet another medication to help. She states it is often use for bipolar, but her psychiatrist just think she has major depression, "but I think I also have bipolar"  The last time I saw her was in April. At that time I was hopeful she would be back to work soon. She saw our nurse practitioner a couple weeks later and stated that she actually could not work. I work excuse was written. Through May. At that time the patient went off on multiple workups with multiple specialists. Next  Patient reports today that she has not worked since the spring.  2 yet to see another specialist gastroenterologist because of persistent loose stools.  Has also had a hematologist way and stating that she should get B-12 shots. Next  Has also been to the  OB/GYN who is giving her the B-12 shots. Next  Has also been to ENT who worked her up and found no major difficulties  Patient would also like to discuss vitamin B12.  Patient had borderline low B-12 levels. Was advsed first by er caiologist and then by  Nerve condctn studies were normal, they may consider nerve biopsy  Heart rate I  latuda   Pt has cousin   Review of Systems Positive fatigue positive leg pain positive rapid heart rate positive night sweats positive no energy positive feeling depressed, however patient states her depression is coming from all of her physical symptoms    Objective:   Physical Exam  Alert tearful no acute distress neck supple no palpable thyroid abnormality blood pressure good on repeat lungs clear heart regular in rhythm H&T normal heart rate today upper 90s      Assessment & Plan:  Impression very complicated situation with both physical and mental symptomatology. Followed closely by psychiatrist at this time. Been worked up intensely by several specialists. Patient requests yet another workup regarding her thyroid. We will honor this. We will repeat ultrasound and work towards endocrinology referral. Patient has yet to see her GI specialist. Is finishing up to cardiology workup. May need a hematology workup per her statement regarding B-12 absorption. A full 40 minutes spent with this enormously complicated and murky situation

## 2016-06-14 ENCOUNTER — Encounter: Payer: Self-pay | Admitting: *Deleted

## 2016-06-14 ENCOUNTER — Ambulatory Visit (INDEPENDENT_AMBULATORY_CARE_PROVIDER_SITE_OTHER): Payer: 59 | Admitting: *Deleted

## 2016-06-14 ENCOUNTER — Telehealth: Payer: Self-pay | Admitting: *Deleted

## 2016-06-14 DIAGNOSIS — E538 Deficiency of other specified B group vitamins: Secondary | ICD-10-CM

## 2016-06-14 MED ORDER — CYANOCOBALAMIN 1000 MCG/ML IJ SOLN
1000.0000 ug | Freq: Once | INTRAMUSCULAR | Status: AC
  Administered 2016-06-14: 1000 ug via INTRAMUSCULAR

## 2016-06-14 NOTE — Progress Notes (Signed)
Pt here for Vit B 12 injection. Pt tolerated shot well. Return in 1 week for next shot. [place]

## 2016-06-14 NOTE — Telephone Encounter (Signed)
Patient states that she thought the form was being sent to her specialist. She states that her vitamin B12 is very low and steadily dropping. She states that since she has a "primary care doctor that doesn't care about me, laughs at me and breaks Hipp[place], I guess I am just screwed."

## 2016-06-14 NOTE — Telephone Encounter (Signed)
Advise pt we received form from p and g asking about work excuse. We last wrote work excuse in early May. Since then, who has been extending excuses? Since I really do not know what is going on with Destiny Orr, I think the work excuse needs to be written by her specialists. Destiny Orr discuss this with her. See her response. Have a heart to heart talk with her as to why she thinks she is disabled. Document her response

## 2016-06-14 NOTE — Telephone Encounter (Signed)
Left message on voicemail return call  

## 2016-06-14 NOTE — Telephone Encounter (Signed)
See form concerning ongoing work excuse

## 2016-06-16 ENCOUNTER — Telehealth: Payer: Self-pay | Admitting: Obstetrics & Gynecology

## 2016-06-16 ENCOUNTER — Ambulatory Visit (INDEPENDENT_AMBULATORY_CARE_PROVIDER_SITE_OTHER): Payer: 59 | Admitting: Family Medicine

## 2016-06-16 ENCOUNTER — Encounter: Payer: Self-pay | Admitting: Family Medicine

## 2016-06-16 ENCOUNTER — Telehealth (HOSPITAL_COMMUNITY): Payer: Self-pay | Admitting: *Deleted

## 2016-06-16 ENCOUNTER — Encounter: Payer: Self-pay | Admitting: Obstetrics & Gynecology

## 2016-06-16 DIAGNOSIS — R202 Paresthesia of skin: Secondary | ICD-10-CM | POA: Diagnosis not present

## 2016-06-16 DIAGNOSIS — M797 Fibromyalgia: Secondary | ICD-10-CM

## 2016-06-16 DIAGNOSIS — E538 Deficiency of other specified B group vitamins: Secondary | ICD-10-CM | POA: Diagnosis not present

## 2016-06-16 DIAGNOSIS — E041 Nontoxic single thyroid nodule: Secondary | ICD-10-CM

## 2016-06-16 DIAGNOSIS — I7300 Raynaud's syndrome without gangrene: Secondary | ICD-10-CM | POA: Diagnosis not present

## 2016-06-16 DIAGNOSIS — R200 Anesthesia of skin: Secondary | ICD-10-CM

## 2016-06-16 DIAGNOSIS — R42 Dizziness and giddiness: Secondary | ICD-10-CM | POA: Insufficient documentation

## 2016-06-16 NOTE — Telephone Encounter (Signed)
Left message x 1. [name] 

## 2016-06-16 NOTE — Telephone Encounter (Signed)
Spoke with pt. Pt has been on short term disability since March. She has to send in a note every 30 days stating she still can't work. Pt wants to know if you would be willing to do this due to Vit B 12 def. Please advise. Thanks!! [place]

## 2016-06-16 NOTE — Telephone Encounter (Signed)
Destiny Orr faxed her paperwork yesterday. I CANNOT fill out the part about taking her out of work because I was not the MD who originally took her out. I gave her latuda to try last visit which was only last week. She is welcome to have her mother come but will need to sign release

## 2016-06-16 NOTE — Telephone Encounter (Signed)
Pt called to check on her request for disability that was faxed to office. Informed pt that office will have to check and call her back. Pt then stated that is it possible for her mom to come with her to her next appt because she feels like she can better explain as what how she's been feeling. Per pt, she feels like she's not explaining to Dr. Harrington Challenger the severity of her condition correct because she feels like she's not being token seriously. Per pt, she just don't only have social anxiety. Per pt, her condition is to the point where she can not go to the stores during the day. Per pt, she have to go to the stores (like [employer]) around 12 midnight when she knows there's not a lot of people around. Per pt, when she goes to the store, she can here EVERYTHING. Per pt, she can hear the wheels moving on EVERY cart, hear EVERY conversation and hear EVERY footsteps. Per pt, she feels like they are all coming at her. Per pt, she was token off her Adderall. Asked pt how did she want Dr. Harrington Challenger to help her? Per pt, she was told by Dr. Harrington Challenger that she was waiting for her feedback on her condition from her Cardiac stand point before she could get on any other medication. Per pt, test was done at [facility]. Per pt, she was told that the results came back normal. Informed pt message will be sent to provider.

## 2016-06-16 NOTE — Telephone Encounter (Signed)
Spoke with pt letting her know Dr. Elonda Husky done note this one time but he wouldn't be responsible for this going forward. Pt voiced understanding. [place]

## 2016-06-16 NOTE — Telephone Encounter (Signed)
I will this 1 time but I am definitely not the physician to be responsible for this going forward She may need to see a disability physician if this is on going

## 2016-06-16 NOTE — Progress Notes (Signed)
HPI:   [name] is a 35 y.o. female, who is here today to establish care with me.  Former PCP: Dr Destiny Orr  Concerns today:  multiple, she denies any new symptoms. Mainly concern about low B12.  She has a complex hx, multiple symptoms and according to pt, nobody has been able to determine etiology. According to pt, about 6 months ago after she got sick with influenza she started with severe fatigue, scattered numbness and tingling on 4 extremities, unstable balance, and memory problems among some. She still drives but states that sometimes she does not feel comfortable doing so. She denies any recent fall.   She has Hx of B12 deficiency, she is currently on B12 1000 mcg monthly, which she is receiving from her gyn, Dr Destiny Orr. According to pt, her former PCP refused to treat low B12, she is concerned about all her symptoms being related to B12 deficiency and possible sequale due to delay treatment.  Lab Results  Component Value Date   VITAMINB12 264 09/28/2014    Hx of upper and lower back pain and arthralgias.    Lumbar MRI 04/2010: L3-4:  Minimal facet ligamentous prominence.  No stenosis.   L4-5:  Disc bulge.  Mild facet and ligamentous prominence.  Mild narrowing of the lateral recesses without gross neural compression.   L5-S1:  Mild facet and ligamentous prominence.  No stenosis.     - She is concerned about thyroid nodule, has had negative thyroid panel. She would like to have thyroid abs also done, has thyroid U/S pending as well as endocrinology referral.  Lab Results  Component Value Date   TSH 1.520 12/22/2015   She feels like nodule is growing and having some difficulty swallowing and voice changes. She has Hx of allergic rhinitis, has had post nasal drainage and rhinorrhea. She denies fever or chills.  Thyroid U/S 10/2014: Solitary solid nodule in the right lobe of the thyroid gland measuring 1.3 x 0.7 x 1.0 cm. This does not meet size criteria  for biopsy by ultrasound.   - Hx of bipolar disorder, she is currently on [place], she tells me that she states that she requested this medication from her psychiatrist, started it about a month ago.Other medications she tried before have not worked. She takes Clonazepam 1 mg TID She follows with Dr Destiny Orr.  - Also mentions fingers and mainly toes turning "blue, pale, and red" with burning sensation.Not related with cold weather, has symptoms intermittently even during hot weather. She has followed with rheumatologists, Dx with fibromyalgia.  - Hx of tachycardia, she is following with cardiologists. According to pt, she has had extensive cardiac work-up and so far it has been negative. Constant chest pain and exertional dyspnea as well as intermittent palpitations. Currently she is on Metoprolol Succinate 25 mg daily.  She does not feel like it is safe for her to work. She states that her current job involves being close to machines and she could get injured, she has a letter to excuse her from working, expired.She is requesting one until she know what is causing her symptoms. She is also following with neurologists, had EMG and negative;has an appt coming for nerve Bx. Hx of migraine headaches, she is not taking Topamax as recommended by neurologists.  She lives with her mother and her 2 children.She states that she does not feel like she can take care of her children.  She also has an appt with GI in the  next few days.She states that she has gain wt in the past couple months and it seems all localized on her abdomen. Also because she is "not absorbing B12." Loose stools, 1-2 episodes daily, stable for years, IBS-D.  Hx of pelvic pain, which has ben attributed to ovarian cyst, according to pt, her gyn is planning on repeating pelvic/transvaginal u/s in 3 months. She is on Depo-Provera.  She has not noted changes in bowel habits or blood in stool. She denies alcohol abuse. +  Smoker.    Review of Systems  Constitutional: Positive for fatigue and unexpected weight change. Negative for appetite change and fever.  HENT: Positive for postnasal drip, rhinorrhea and voice change. Negative for congestion, facial swelling, mouth sores, nosebleeds and trouble swallowing.   Eyes: Negative for pain, redness and visual disturbance.  Respiratory: Positive for shortness of breath. Negative for cough and wheezing.   Cardiovascular: Positive for chest pain and palpitations. Negative for leg swelling.  Gastrointestinal: Positive for diarrhea. Negative for abdominal pain, blood in stool, nausea and vomiting.       Negative for changes in bowel habits.  Endocrine: Negative for polydipsia, polyphagia and polyuria.  Genitourinary: Positive for pelvic pain. Negative for decreased urine volume, difficulty urinating, dysuria, hematuria, vaginal bleeding and vaginal discharge.  Musculoskeletal: Positive for arthralgias, back pain, gait problem and myalgias.  Skin: Positive for color change and pallor. Negative for rash and wound.  Allergic/Immunologic: Positive for environmental allergies.  Neurological: Positive for dizziness, numbness and headaches. Negative for seizures, syncope and weakness.  Hematological: Negative for adenopathy. Does not bruise/bleed easily.  Psychiatric/Behavioral: Positive for confusion and sleep disturbance. Negative for hallucinations. The patient is nervous/anxious.       Current Outpatient Prescriptions on File Prior to Visit  Medication Sig Dispense Refill  . clonazePAM (KLONOPIN) 1 MG tablet Take 1 tablet (1 mg total) by mouth 3 (three) times daily. 90 tablet 2  . cyanocobalamin (,VITAMIN B-12,) 1000 MCG/ML injection TAKE TO OFFICE FOR INJECTION. 1 mL 52  . ibuprofen (ADVIL,MOTRIN) 200 MG tablet Take 200 mg by mouth daily as needed (pain).     Marland Kitchen lurasidone (LATUDA) 20 MG TABS tablet Take by mouth daily.    . medroxyPROGESTERone (DEPO-PROVERA) 150  MG/ML injection INJECT 1 VIAL INTRAMUSCULARLY EVERY 3 MONTHS IN OFFICE. 1 mL 4  . metoprolol succinate (TOPROL-XL) 25 MG 24 hr tablet Take 25 mg by mouth daily.    . Multiple Vitamins-Minerals (CENTRUM SILVER 50+WOMEN) TABS Take by mouth daily.     . NON FORMULARY Methylcobalamin (Vitamin B12), Taking Once a Month    . penicillin v potassium (VEETID) 500 MG tablet Take 500 mg by mouth 3 (three) times daily.      No current facility-administered medications on file prior to visit.      Past Medical History:  Diagnosis Date  . Abnormal uterine bleeding (AUB) 12/09/2014  . Anxiety   . Arthritis   . Depression   . DJD (degenerative joint disease)   . Dysmenorrhea 07/15/2014  . Fatigue 12/24/2012  . Fibromyalgia diagnosed April 2016  . Headache   . Hx of migraine headaches 12/24/2012  . Inappropriate sinus tachycardia ([place])   . Irregular menstrual bleeding 07/15/2014  . Pelvic pain in female 03/03/2016  . Pneumothorax, spontaneous, tension   . POTS (postural orthostatic tachycardia syndrome)   . PTSD (post-traumatic stress disorder)   . Scoliosis   . Thyroid disease   . Tick bite 03/03/2016  . Vitamin B 12 deficiency  Allergies  Allergen Reactions  . Lexapro [Escitalopram Oxalate] Other (See Comments)    Flat affect, No emotions    Family History  Problem Relation Age of Onset  . Hypertension Father   . Atrial fibrillation Father   . Alcohol abuse Father   . Cancer Maternal Grandmother     skin   . Heart disease Maternal Grandmother   . Other Maternal Grandmother     had thyroid removed  . Breast cancer Maternal Grandmother   . Heart disease Paternal Grandfather   . COPD Paternal Grandfather   . Hypertension Paternal Grandfather   . Diabetes Paternal Grandfather   . Stroke Paternal Grandfather   . Anxiety disorder Mother   . Cancer Maternal Grandfather     bladder,lung  . Anxiety disorder Maternal Aunt   . Anxiety disorder Maternal Uncle   . Bipolar disorder Maternal  Uncle   . Breast cancer Maternal Uncle     CML  . Leukemia Other   . Colon cancer Other     lung-2 maternal great uncles    Social History   Social History  . Marital status: Legally Separated    Spouse name: N/A  . Number of children: N/A  . Years of education: N/A   Social History Main Topics  . Smoking status: Current Every Day Smoker    Packs/day: 1.00    Years: 15.00    Types: Cigarettes    Start date: 03/13/1997  . Smokeless tobacco: Never Used     Comment: "working on it" Was smoking 2.5 packs a day  . Alcohol use No  . Drug use: No  . Sexual activity: Yes    Partners: Male    Birth control/ protection: Injection   Other Topics Concern  . None   Social History Narrative  . None    Vitals:   06/16/16 1004  BP: 118/70  Pulse: 96  Resp: 12   O2 sat 95% at RA  Body mass index is 27.95 kg/m.    Physical Exam  Nursing note and vitals reviewed. Constitutional: She is oriented to person, place, and time. She appears well-developed. She does not appear ill. No distress.  HENT:  Head: Atraumatic.  Mouth/Throat: Oropharynx is clear and moist and mucous membranes are normal. Abnormal dentition.  Post nasal drainage.  Eyes: Conjunctivae and EOM are normal. Pupils are equal, round, and reactive to light.  Neck: No JVD present. Thyroid mass (Right) and thyromegaly present.  Cardiovascular: Normal rate and regular rhythm.   No murmur heard. Pulses:      Dorsalis pedis pulses are 2+ on the right side, and 2+ on the left side.  Respiratory: Effort normal and breath sounds normal. No stridor. No respiratory distress.  GI: Soft. She exhibits no mass. There is no hepatomegaly. There is no tenderness.  Musculoskeletal: She exhibits no edema.  No significant deformity appreciated. + tenderness upon palpation of paraspinal muscles: lower thoracic and lumbar mainly. Trigger points on chest wall and lower extremities. Pain is not elicited with movement on exam table  during examination.  No significant joint deformities or signs of synovitis.   Lymphadenopathy:    She has no cervical adenopathy.  Neurological: She is alert and oriented to person, place, and time. She has normal strength. No cranial nerve deficit. Coordination normal.  SLR negative bilateral. Gait stable with no assistance needed.  Skin: Skin is warm. No rash noted. No cyanosis or erythema. Nails show no clubbing.  Psychiatric: Her speech is  normal. Her mood appears anxious. Cognition and memory are normal.  Poor groomed, good eye contact.      ASSESSMENT AND PLAN:   Jesslyn was seen today for new patient (initial visit).  Diagnoses and all orders for this visit:   Fibromyalgia syndrome  We discussed clinical presentation of disease. Treatment options: Cymbalta, Gabapentin, or Lyrica. Good sleep hygiene and low impact exercise.   Raynaud's syndrome without gangrene  We discussed physiopathology and foot care. Possible treatment options also discussed in case they are needed in the future. I do not think further studies are needed at this time. Foot care discussed, mainly during cold weather.  Numbness and tingling sensation of skin  Unknown etiology, explained that fibromyalgia also can be associated to numbness and tingling. Keep appt with neurologists. Pending nerve Bx.   B12 deficiency  Continue B12 1000 mcg weekly until she completes a week then monthly. We discussed some symptoms of B12 deficiency, difficult to know if numbness and balance are related to this, I am not positive they do so.  Dizziness and giddiness  Possible causes discussed. Fall precautions recommended. 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  Solitary nodule of right lobe of thyroid  Thyroid U/S reviewed, she was reassured. Keeps appt for thyroid U/S and endocrinologists.   I reviewed some of records and in 12/26/2014 she was reporting similar  symptoms during psychiatric evaluation: arthritis,heart arrhythmias, weakness, low energy,depression, and chronic pain among some. So it seems like most of her symptoms have been going on for over a year. I think fibromyalgia and bipolar disorder could explained most of her symptoms. She will continue following with Dr Destiny Orr.   OV face to face from 10:16 to 10:59 am, > 50% of time was dedicated to discussion of symptoms and possible etiologies. B12 def symptoms and treatment.        Teryn Boerema G. Martinique, MD  [facility]. [place] office.

## 2016-06-16 NOTE — Patient Instructions (Addendum)
A few things to remember from today's visit:   Fibromyalgia syndrome  Raynaud's syndrome without gangrene  Numbness and tingling sensation of skin  B12 deficiency  Dizziness and giddiness  For now I'm not changing any of your current medications. He will continue following up with neurologist, psychiatrist, gastroenterologist, endocrinologist, and cardiologist.  We may need to schedule another evaluation with rheumatologist.  No further recommendations, letter given today for work safety.    Please be sure medication list is accurate. If a new problem present, please set up appointment sooner than planned today.

## 2016-06-18 DIAGNOSIS — E049 Nontoxic goiter, unspecified: Secondary | ICD-10-CM | POA: Insufficient documentation

## 2016-06-19 ENCOUNTER — Ambulatory Visit (INDEPENDENT_AMBULATORY_CARE_PROVIDER_SITE_OTHER): Payer: 59 | Admitting: Obstetrics & Gynecology

## 2016-06-19 ENCOUNTER — Encounter: Payer: Self-pay | Admitting: Obstetrics & Gynecology

## 2016-06-19 ENCOUNTER — Ambulatory Visit (HOSPITAL_COMMUNITY)
Admission: RE | Admit: 2016-06-19 | Discharge: 2016-06-19 | Disposition: A | Discharge status: Home / Self Care | Payer: 59 | Source: Ambulatory Visit | Attending: Family Medicine | Admitting: Family Medicine

## 2016-06-19 ENCOUNTER — Other Ambulatory Visit (HOSPITAL_COMMUNITY)
Admission: RE | Admit: 2016-06-19 | Discharge: 2016-06-19 | Disposition: A | Discharge status: Home / Self Care | Payer: 59 | Source: Ambulatory Visit | Attending: Obstetrics & Gynecology | Admitting: Obstetrics & Gynecology

## 2016-06-19 VITALS — BP 100/60 | HR 89 | Ht 65.0 in | Wt 166.0 lb

## 2016-06-19 DIAGNOSIS — Z1151 Encounter for screening for human papillomavirus (HPV): Secondary | ICD-10-CM | POA: Diagnosis present

## 2016-06-19 DIAGNOSIS — Z01419 Encounter for gynecological examination (general) (routine) without abnormal findings: Secondary | ICD-10-CM

## 2016-06-19 DIAGNOSIS — E041 Nontoxic single thyroid nodule: Secondary | ICD-10-CM | POA: Insufficient documentation

## 2016-06-19 NOTE — Progress Notes (Signed)
Subjective:     Destiny Orr is a 35 y.o. female here for a routine exam.  No LMP recorded. Patient has had an injection. [id number] Birth Control Method:  Depo provera Menstrual Calendar(currently): amenorrheic on depo  Current complaints: per previous notes regarding her small left ovarian cyst and B12 deficiency.   Current acute medical issues:  Per previous notes   Recent Gynecologic History No LMP recorded. Patient has had an injection. Last Pap: 2016,  normal Last mammogram: ,    Past Medical History:  Diagnosis Date  . Abnormal uterine bleeding (AUB) 12/09/2014  . Anxiety   . Arthritis   . Depression   . DJD (degenerative joint disease)   . Dysmenorrhea 07/15/2014  . Fatigue 12/24/2012  . Fibromyalgia diagnosed April 2016  . Headache   . Hx of migraine headaches 12/24/2012  . Inappropriate sinus tachycardia ([place])   . Irregular menstrual bleeding 07/15/2014  . Pelvic pain in female 03/03/2016  . Pneumothorax, spontaneous, tension   . POTS (postural orthostatic tachycardia syndrome)   . PTSD (post-traumatic stress disorder)   . Scoliosis   . Thyroid disease   . Tick bite 03/03/2016  . Vitamin B 12 deficiency     Past Surgical History:  Procedure Laterality Date  . bone spurs toes Right   . CESAREAN SECTION    . KNEE SURGERY      OB History    Gravida Para Term Preterm AB Living   3 2 2   1 3    SAB TAB Ectopic Multiple Live Births         1 3      Social History   Social History  . Marital status: Legally Separated    Spouse name: N/A  . Number of children: N/A  . Years of education: N/A   Social History Main Topics  . Smoking status: Current Every Day Smoker    Packs/day: 1.00    Years: 15.00    Types: Cigarettes    Start date: 03/13/1997  . Smokeless tobacco: Never Used     Comment: "working on it" Was smoking 2.5 packs a day  . Alcohol use No  . Drug use: No  . Sexual activity: Yes    Partners: Male    Birth control/ protection: Injection   Other  Topics Concern  . None   Social History Narrative  . None    Family History  Problem Relation Age of Onset  . Hypertension Father   . Atrial fibrillation Father   . Alcohol abuse Father   . Cancer Maternal Grandmother     skin   . Heart disease Maternal Grandmother   . Other Maternal Grandmother     had thyroid removed  . Breast cancer Maternal Grandmother   . Heart disease Paternal Grandfather   . COPD Paternal Grandfather   . Hypertension Paternal Grandfather   . Diabetes Paternal Grandfather   . Stroke Paternal Grandfather   . Anxiety disorder Mother   . Cancer Maternal Grandfather     bladder,lung  . Anxiety disorder Maternal Aunt   . Anxiety disorder Maternal Uncle   . Bipolar disorder Maternal Uncle   . Breast cancer Maternal Uncle     CML  . Leukemia Other   . Colon cancer Other     lung-2 maternal great uncles     Current Outpatient Prescriptions:  .  clonazePAM (KLONOPIN) 1 MG tablet, Take 1 tablet (1 mg total) by mouth 3 (three) times daily.,  Disp: 90 tablet, Rfl: 2 .  cyanocobalamin (,VITAMIN B-12,) 1000 MCG/ML injection, TAKE TO OFFICE FOR INJECTION., Disp: 1 mL, Rfl: 52 .  ibuprofen (ADVIL,MOTRIN) 200 MG tablet, Take 200 mg by mouth daily as needed (pain). , Disp: , Rfl:  .  lurasidone (LATUDA) 20 MG TABS tablet, Take by mouth daily., Disp: , Rfl:  .  medroxyPROGESTERone (DEPO-PROVERA) 150 MG/ML injection, INJECT 1 VIAL INTRAMUSCULARLY EVERY 3 MONTHS IN OFFICE., Disp: 1 mL, Rfl: 4 .  metoprolol succinate (TOPROL-XL) 25 MG 24 hr tablet, Take 25 mg by mouth daily., Disp: , Rfl:  .  Multiple Vitamins-Minerals (CENTRUM SILVER 50+WOMEN) TABS, Take by mouth daily. , Disp: , Rfl:  .  NON FORMULARY, Methylcobalamin (Vitamin B12), Taking Once a Month, Disp: , Rfl:   Review of Systems  Review of Systems  Constitutional: Negative for fever, chills, weight loss, malaise/fatigue and diaphoresis.  HENT: Negative for hearing loss, ear pain, nosebleeds, congestion,  sore throat, neck pain, tinnitus and ear discharge.   Eyes: Negative for blurred vision, double vision, photophobia, pain, discharge and redness.  Respiratory: Negative for cough, hemoptysis, sputum production, shortness of breath, wheezing and stridor.   Cardiovascular: Negative for chest pain, palpitations, orthopnea, claudication, leg swelling and PND.  Gastrointestinal: negative for abdominal pain. Negative for heartburn, nausea, vomiting, diarrhea, constipation, blood in stool and melena.  Genitourinary: Negative for dysuria, urgency, frequency, hematuria and flank pain.  Musculoskeletal: Negative for myalgias, back pain, joint pain and falls.  Skin: Negative for itching and rash.  Neurological: Negative for dizziness, tingling, tremors, sensory change, speech change, focal weakness, seizures, loss of consciousness, weakness and headaches.  Endo/Heme/Allergies: Negative for environmental allergies and polydipsia. Does not bruise/bleed easily.  Psychiatric/Behavioral: Negative for depression, suicidal ideas, hallucinations, memory loss and substance abuse. The patient is not nervous/anxious and does not have insomnia.        Objective:  Blood pressure 100/60, pulse 89, height 5\' 5"  (1.651 m), weight 166 lb (75.3 kg).   Physical Exam  Vitals reviewed. Constitutional: She is oriented to person, place, and time. She appears well-developed and well-nourished.  HENT:  Head: Normocephalic and atraumatic.        Right Ear: External ear normal.  Left Ear: External ear normal.  Nose: Nose normal.  Mouth/Throat: Oropharynx is clear and moist.  Eyes: Conjunctivae and EOM are normal. Pupils are equal, round, and reactive to light. Right eye exhibits no discharge. Left eye exhibits no discharge. No scleral icterus.  Neck: Normal range of motion. Neck supple. No tracheal deviation present. No thyromegaly present.  Cardiovascular: Normal rate, regular rhythm, normal heart sounds and intact distal  pulses.  Exam reveals no gallop and no friction rub.   No murmur heard. Respiratory: Effort normal and breath sounds normal. No respiratory distress. She has no wheezes. She has no rales. She exhibits no tenderness.  GI: Soft. Bowel sounds are normal. She exhibits no distension and no mass. There is no tenderness. There is no rebound and no guarding.  Genitourinary:  Breasts no masses skin changes or nipple changes bilaterally      Vulva is normal without lesions Vagina is pink moist without discharge Cervix normal in appearance and pap is done Uterus is normal size shape and contour Adnexa is negative with normal sized ovaries   Musculoskeletal: Normal range of motion. She exhibits no edema and no tenderness.  Neurological: She is alert and oriented to person, place, and time. She has normal reflexes. She displays normal reflexes. No cranial  nerve deficit. She exhibits normal muscle tone. Coordination normal.  Skin: Skin is warm and dry. No rash noted. No erythema. No pallor.  Psychiatric: She has a normal mood and affect. Her behavior is normal. Judgment and thought content normal.       Medications Ordered at today's visit: No orders of the defined types were placed in this encounter.   Other orders placed at today's visit: No orders of the defined types were placed in this encounter.     Assessment:    Healthy female exam.    Plan:    Follow up in: 1 year.     Return in about 1 year (around 06/19/2017) for weekly B12 injections on Wednesdays.

## 2016-06-19 NOTE — Telephone Encounter (Signed)
Called pt and informed her with what provider stated. Per pt, the Destiny Orr is working. Pt verbalized understanding that she have to sign release for mother.

## 2016-06-20 ENCOUNTER — Telehealth (HOSPITAL_COMMUNITY): Payer: Self-pay | Admitting: *Deleted

## 2016-06-20 ENCOUNTER — Telehealth: Payer: Self-pay | Admitting: Family Medicine

## 2016-06-20 ENCOUNTER — Encounter: Payer: Self-pay | Admitting: Family Medicine

## 2016-06-20 DIAGNOSIS — E538 Deficiency of other specified B group vitamins: Secondary | ICD-10-CM

## 2016-06-20 NOTE — Telephone Encounter (Signed)
Pt states if dr Martinique thinks she needs referral to endocrinology, pt cannot see  Dr Loanne Drilling because of family.  He is married to her cousin.

## 2016-06-20 NOTE — Telephone Encounter (Signed)
Pt has the results of her thyroid ultrasound and would like a nurse to return her call. This ultrasound was order by her old physician

## 2016-06-20 NOTE — Telephone Encounter (Signed)
Thyroid U/S done today : Stable solitary right thyroid nodule. Based on size and imaging characteristics, no further dedicated imaging follow-up or biopsy recommended. If she still would like to follow with endocrinologists, it is ok to place referral, Dx R solitary thyroid nodule (is in her problem list). Thanks, BJ

## 2016-06-20 NOTE — Telephone Encounter (Signed)
I looked at the notes from cardiology [facility] when she was last here. The note from the cardiologist in my box simply state that cardiac problems do not explain her symptoms. The only way to tell if B12 is a factor Is to stay on the injections for several months and see if memory improves. She can retry meds for ADD as well if she would like

## 2016-06-20 NOTE — Telephone Encounter (Signed)
Pt called asking if office received her Cardiologist report and if Dr. Harrington Challenger had the chance to look at it. Per pt she would like to know Dr. Harrington Challenger thoughts. Per pt she don't know if her memory loss is related to her Vitamin B12 deficiency or if it's her ADHD. Informed pt that notes were received and is in providers box. Pt verbalized understanding .

## 2016-06-20 NOTE — Telephone Encounter (Signed)
Called and spoke with patient. The ultrasound is on epic, can you compare the one done yesterday and the one done back in January/March? She is concerned about the changes in her voice, and the difficulty swallowing. She wants to know if we need to refer her to an endocrinologist.

## 2016-06-21 ENCOUNTER — Ambulatory Visit: Payer: Self-pay

## 2016-06-21 ENCOUNTER — Telehealth: Payer: Self-pay | Admitting: *Deleted

## 2016-06-21 ENCOUNTER — Encounter (HOSPITAL_COMMUNITY): Payer: Self-pay | Admitting: *Deleted

## 2016-06-21 ENCOUNTER — Other Ambulatory Visit (HOSPITAL_COMMUNITY): Payer: Self-pay | Admitting: Psychiatry

## 2016-06-21 LAB — CYTOLOGY - PAP

## 2016-06-21 MED ORDER — AMPHETAMINE-DEXTROAMPHETAMINE 10 MG PO TABS: 10.0000 mg | ORAL_TABLET | Freq: Two times a day (BID) | ORAL | 0 refills | Status: DC

## 2016-06-21 NOTE — Telephone Encounter (Signed)
I spoke with the patient. Her OBGYN wants to know when you would like for her B12 to be checked again, he did start her on the B12, but didn't know if you wanted to take control over it.

## 2016-06-21 NOTE — Telephone Encounter (Signed)
Adderall 10 mg printed

## 2016-06-21 NOTE — Telephone Encounter (Signed)
sure

## 2016-06-21 NOTE — Telephone Encounter (Signed)
Spoke with pt and informed her with what Dr. Harrington Challenger stated. Per pt she would like to go back on Adderall but start off with a low dose. Pt number is [phone]

## 2016-06-21 NOTE — Telephone Encounter (Signed)
Pt states missed her appt today but can come in this afternoon for B12. Pt states can she start giving herself the B12 injection?

## 2016-06-21 NOTE — Progress Notes (Signed)
Pt came into office to pick up her printed script for her Adderall. Pt is aware of directions and verbalized understanding. Pt DL number is [id number] with a expiration date of 07-05-2016. Pt script order number is [id number]

## 2016-06-21 NOTE — Telephone Encounter (Signed)
I am ok with taking over B12 replacement, whatever is easier for Ms Tunney. Usually I re-check B12 levels 3-4 months after initiating treatment.  Thanks, BJ

## 2016-06-21 NOTE — Progress Notes (Unsigned)
Adderall

## 2016-06-21 NOTE — Telephone Encounter (Signed)
Left voicemail for patient to call the office back.   

## 2016-06-21 NOTE — Telephone Encounter (Signed)
Pt is aware printed script is ready for pick up

## 2016-06-22 NOTE — Addendum Note (Signed)
Addended by: Kateri Mc E on: 06/22/2016 03:02 PM   Modules accepted: Orders

## 2016-06-22 NOTE — Telephone Encounter (Signed)
Patient started injections on 08.30.17. Future lab order placed for 11.30.17, patient aware.

## 2016-06-26 DIAGNOSIS — Z0289 Encounter for other administrative examinations: Secondary | ICD-10-CM

## 2016-06-28 ENCOUNTER — Ambulatory Visit: Payer: 59

## 2016-07-05 ENCOUNTER — Ambulatory Visit (HOSPITAL_COMMUNITY): Payer: Self-pay | Admitting: Psychiatry

## 2016-07-10 ENCOUNTER — Ambulatory Visit (INDEPENDENT_AMBULATORY_CARE_PROVIDER_SITE_OTHER): Payer: 59 | Admitting: Psychiatry

## 2016-07-10 ENCOUNTER — Encounter (HOSPITAL_COMMUNITY): Payer: Self-pay | Admitting: Psychiatry

## 2016-07-10 ENCOUNTER — Telehealth (HOSPITAL_COMMUNITY): Payer: Self-pay | Admitting: *Deleted

## 2016-07-10 VITALS — BP 116/78 | HR 100 | Ht 65.0 in | Wt 161.4 lb

## 2016-07-10 DIAGNOSIS — F4310 Post-traumatic stress disorder, unspecified: Secondary | ICD-10-CM

## 2016-07-10 DIAGNOSIS — Z808 Family history of malignant neoplasm of other organs or systems: Secondary | ICD-10-CM

## 2016-07-10 DIAGNOSIS — Z806 Family history of leukemia: Secondary | ICD-10-CM

## 2016-07-10 DIAGNOSIS — Z8249 Family history of ischemic heart disease and other diseases of the circulatory system: Secondary | ICD-10-CM | POA: Diagnosis not present

## 2016-07-10 DIAGNOSIS — Z833 Family history of diabetes mellitus: Secondary | ICD-10-CM

## 2016-07-10 DIAGNOSIS — Z800 Family history of malignant neoplasm of digestive organs: Secondary | ICD-10-CM

## 2016-07-10 DIAGNOSIS — F411 Generalized anxiety disorder: Secondary | ICD-10-CM

## 2016-07-10 DIAGNOSIS — Z803 Family history of malignant neoplasm of breast: Secondary | ICD-10-CM

## 2016-07-10 DIAGNOSIS — F332 Major depressive disorder, recurrent severe without psychotic features: Secondary | ICD-10-CM

## 2016-07-10 DIAGNOSIS — Z8489 Family history of other specified conditions: Secondary | ICD-10-CM

## 2016-07-10 DIAGNOSIS — Z823 Family history of stroke: Secondary | ICD-10-CM

## 2016-07-10 DIAGNOSIS — Z801 Family history of malignant neoplasm of trachea, bronchus and lung: Secondary | ICD-10-CM

## 2016-07-10 DIAGNOSIS — Z811 Family history of alcohol abuse and dependence: Secondary | ICD-10-CM

## 2016-07-10 MED ORDER — ALPRAZOLAM 1 MG PO TABS: 1.0000 mg | ORAL_TABLET | Freq: Three times a day (TID) | ORAL | 2 refills | Status: DC | PRN

## 2016-07-10 MED ORDER — LURASIDONE HCL 40 MG PO TABS: 40.0000 mg | ORAL_TABLET | Freq: Every day | ORAL | 2 refills | Status: DC

## 2016-07-10 MED ORDER — AMPHETAMINE-DEXTROAMPHETAMINE 10 MG PO TABS: 10.0000 mg | ORAL_TABLET | Freq: Two times a day (BID) | ORAL | 0 refills | Status: DC
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
## 2016-07-10 NOTE — Telephone Encounter (Signed)
Per pt she would like to restart Xanax. Informed Dr. Harrington Challenger and she is fine with it. Per Dr. Harrington Challenger to call pharmacy and cancel out pt Klonopin. Called pt pharmacy and spoke with Ovid Curd and he stated pt gets that filled with [facility] and if office wants him to cancel it out. Informed Ovid Curd to go ahead and do so per Dr. Harrington Challenger. Ovid Curd verbalized understanding. Dr. Harrington Challenger printed Xanax script for pt and it was given to her. Pt showed understanding.

## 2016-07-12 ENCOUNTER — Ambulatory Visit (HOSPITAL_COMMUNITY): Payer: 59 | Admitting: Psychiatry

## 2016-07-13 DIAGNOSIS — Z0289 Encounter for other administrative examinations: Secondary | ICD-10-CM

## 2016-07-26 ENCOUNTER — Encounter (HOSPITAL_COMMUNITY): Payer: Self-pay | Admitting: Psychiatry

## 2016-07-26 ENCOUNTER — Ambulatory Visit (INDEPENDENT_AMBULATORY_CARE_PROVIDER_SITE_OTHER): Payer: 59 | Admitting: Psychiatry

## 2016-07-26 DIAGNOSIS — F332 Major depressive disorder, recurrent severe without psychotic features: Secondary | ICD-10-CM

## 2016-07-27 ENCOUNTER — Encounter (HOSPITAL_COMMUNITY): Payer: Self-pay | Admitting: Psychiatry
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
## 2016-07-31 ENCOUNTER — Telehealth: Payer: Self-pay | Admitting: Family Medicine

## 2016-07-31 DIAGNOSIS — M797 Fibromyalgia: Secondary | ICD-10-CM

## 2016-07-31 NOTE — Telephone Encounter (Signed)
It is ok to refer for pain management. Thanks, BJ

## 2016-07-31 NOTE — Telephone Encounter (Signed)
Pt would like a referral to pain management for fibromyalgia.

## 2016-07-31 NOTE — Telephone Encounter (Signed)
Referral okay? 

## 2016-08-01 NOTE — Telephone Encounter (Signed)
Referral placed.

## 2016-08-07 ENCOUNTER — Telehealth (HOSPITAL_COMMUNITY): Payer: Self-pay | Admitting: *Deleted

## 2016-08-07 ENCOUNTER — Ambulatory Visit (HOSPITAL_COMMUNITY): Payer: Self-pay | Admitting: Psychiatry

## 2016-08-07 NOTE — Telephone Encounter (Signed)
Called pt to resch appt due to provider being out of office Nov 6. Called and lm on mobile and called home and spoke with Cheryll Dessert (mother) and lm to have pt call office back and office number provided. Need to resch appt for Nov 6th.

## 2016-08-10 ENCOUNTER — Ambulatory Visit (INDEPENDENT_AMBULATORY_CARE_PROVIDER_SITE_OTHER): Payer: 59 | Admitting: Psychiatry

## 2016-08-10 ENCOUNTER — Telehealth (HOSPITAL_COMMUNITY): Payer: Self-pay | Admitting: *Deleted

## 2016-08-10 ENCOUNTER — Encounter (HOSPITAL_COMMUNITY): Payer: Self-pay | Admitting: Psychiatry

## 2016-08-10 VITALS — BP 116/79 | HR 89 | Ht 65.0 in | Wt 165.0 lb

## 2016-08-10 DIAGNOSIS — Z818 Family history of other mental and behavioral disorders: Secondary | ICD-10-CM

## 2016-08-10 DIAGNOSIS — Z803 Family history of malignant neoplasm of breast: Secondary | ICD-10-CM

## 2016-08-10 DIAGNOSIS — Z8249 Family history of ischemic heart disease and other diseases of the circulatory system: Secondary | ICD-10-CM | POA: Diagnosis not present

## 2016-08-10 DIAGNOSIS — F332 Major depressive disorder, recurrent severe without psychotic features: Secondary | ICD-10-CM

## 2016-08-10 DIAGNOSIS — Z808 Family history of malignant neoplasm of other organs or systems: Secondary | ICD-10-CM

## 2016-08-10 DIAGNOSIS — Z813 Family history of other psychoactive substance abuse and dependence: Secondary | ICD-10-CM

## 2016-08-10 MED ORDER — LURASIDONE HCL 40 MG PO TABS: 40.0000 mg | ORAL_TABLET | Freq: Every day | ORAL | 2 refills | Status: DC

## 2016-08-10 MED ORDER — AMPHETAMINE-DEXTROAMPHETAMINE 20 MG PO TABS: 20.0000 mg | ORAL_TABLET | Freq: Two times a day (BID) | ORAL | 0 refills | Status: DC
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
## 2016-08-10 NOTE — Telephone Encounter (Signed)
I gave her a prescription for xanax today. It has NOT been switched. We absolutely cannot prescribe 2 benzodiazepines at one time for safety reasons

## 2016-08-10 NOTE — Telephone Encounter (Signed)
phone call from patient, she said the Xanax was switched to Clonopin, not doing so well.  She said her previous psychiatrist put her on both the Xanax and the Clonopin, and that worked.   She would like you to try that.

## 2016-08-10 NOTE — Telephone Encounter (Signed)
Per pt she need another note from today's visit to be faxed to [facility] but the 12th or her short term disability will be stopped. Pt number is [phone]

## 2016-08-10 NOTE — Telephone Encounter (Signed)
I filled out the form and it asked for office notes from today

## 2016-08-10 NOTE — Telephone Encounter (Signed)
noted 

## 2016-08-11 ENCOUNTER — Ambulatory Visit (HOSPITAL_COMMUNITY): Payer: Self-pay | Admitting: Psychiatry

## 2016-08-15 NOTE — Telephone Encounter (Signed)
lmtcb and number provided 

## 2016-08-28 ENCOUNTER — Ambulatory Visit (HOSPITAL_COMMUNITY): Payer: Self-pay | Admitting: Psychiatry

## 2016-08-29 ENCOUNTER — Other Ambulatory Visit: Payer: 59

## 2016-08-31 ENCOUNTER — Ambulatory Visit (INDEPENDENT_AMBULATORY_CARE_PROVIDER_SITE_OTHER): Payer: 59

## 2016-08-31 ENCOUNTER — Other Ambulatory Visit: Payer: Self-pay | Admitting: Obstetrics & Gynecology

## 2016-08-31 ENCOUNTER — Ambulatory Visit (INDEPENDENT_AMBULATORY_CARE_PROVIDER_SITE_OTHER): Payer: 59 | Admitting: Obstetrics & Gynecology

## 2016-08-31 ENCOUNTER — Encounter: Payer: Self-pay | Admitting: Obstetrics & Gynecology

## 2016-08-31 ENCOUNTER — Telehealth: Payer: Self-pay | Admitting: *Deleted

## 2016-08-31 VITALS — BP 110/80 | HR 74 | Ht 65.0 in | Wt 164.0 lb

## 2016-08-31 DIAGNOSIS — N83201 Unspecified ovarian cyst, right side: Secondary | ICD-10-CM

## 2016-08-31 DIAGNOSIS — N854 Malposition of uterus: Secondary | ICD-10-CM | POA: Diagnosis not present

## 2016-08-31 DIAGNOSIS — N83202 Unspecified ovarian cyst, left side: Secondary | ICD-10-CM

## 2016-08-31 DIAGNOSIS — R1032 Left lower quadrant pain: Secondary | ICD-10-CM | POA: Diagnosis not present

## 2016-08-31 DIAGNOSIS — N938 Other specified abnormal uterine and vaginal bleeding: Secondary | ICD-10-CM | POA: Diagnosis not present

## 2016-08-31 NOTE — Progress Notes (Signed)
PELVIC US TA/TV: homogeneous anteverted uterus,wnl,EEC 1.7 mm,normal left ov, NEW simple right ovarian cyst 5.1 x 3.8 x 3.8 cm w/a 2.7 x 2.5 x 2.3 cm daughter cyst (arterial and venous flow noted),ov's appear mobile,no free fluid,no significant pain during ultrasound

## 2016-08-31 NOTE — Telephone Encounter (Signed)
Pt advised last depo injection recorded in June. Pt going to call the pharmacy to confirm last time she picked up depo provera.

## 2016-08-31 NOTE — Progress Notes (Signed)
Follow up appointment for results  Chief Complaint  Patient presents with  . Follow-up    ultrasound    Blood pressure 110/80, pulse 74, height 5\' 5"  (1.651 m), weight 164 lb (74.4 kg).  Sonogram report: GYNECOLOGIC SONOGRAM   Destiny Orr is a 35 y.o. [id number] unknown LMP she is her for a pelvic sonogram for LLQ pain,bloating, and irregular bleeding.  Uterus                      6.5 x 5.2 x 3.4 cm, wnl  Endometrium          1.8 mm, symmetrical, wnl  Right ovary             2.3 x 2.7 x 1.4 cm, wnl  Left ovary                5.4 x 4.3 x 4.7cm, 4.8 x 4.2 x 4 cm left ov cyst with a thin walled daughter cyst located w/in the larger cyst 2.5 x 2.3 x 2.4 cm(arterial and venous flow seen)  Small amount of cul de sac fluid,lt adnexal pain during ultrasound,ov's appear mobile.  Technician Comments:  PELVIC US TA/TV: Homogeneous anteverted uterus wnl,EEC 1.8 mm, normal right ov,4.8 x 4.2 x 4 cm left ov cyst with a thin walled daughter cyst located w/in the larger cyst 2.5 x 2.3 x 2.4 cm(arterial and venous flow seen),small amount of cul de sac fluid,lt adnexal pain during ultrasound,ov's appear to be mobile.    Destiny Orr 05/24/2016 12:03 PM  Clinical Impression and recommendations:  I have reviewed the sonogram results above, combined with the patient's current clinical course, below are my impressions and any appropriate recommendations for management based on the sonographic findings.  Normal uterus and thin endometrium consistent with depo provera Normal right ovary Left ovary with 5 cm simple cyst, 1 small daughter cyst within   [place] ordered this encounter: No orders of the defined types were placed in this encounter.   Orders for this encounter: No orders of the defined types were placed in this encounter.   Impression: 1. Cyst of right ovary    Plan: Follow up sonogram 3 months to re evaluate the right ovarian  cyst  Follow Up: Return in about 3 months (around 11/29/2016) for GYN sono, Follow up, with Dr Elonda Husky.       Face to face time:  10 minutes  Greater than 50% of the visit time was spent in counseling and coordination of care with the patient.  The summary and outline of the counseling and care coordination is summarized in the note above.   All questions were answered.  Past Medical History:  Diagnosis Date  . Abnormal uterine bleeding (AUB) 12/09/2014  . Anxiety   . Arthritis   . Depression   . DJD (degenerative joint disease)   . Dysmenorrhea 07/15/2014  . Fatigue 12/24/2012  . Fibromyalgia diagnosed April 2016  . Headache   . Hx of migraine headaches 12/24/2012  . Inappropriate sinus tachycardia   . Irregular menstrual bleeding 07/15/2014  . Pelvic pain in female 03/03/2016  . Pneumothorax, spontaneous, tension   . POTS (postural orthostatic tachycardia syndrome)   . PTSD (post-traumatic stress disorder)   . Scoliosis   . Thyroid disease   . Tick bite 03/03/2016  . Vitamin B 12 deficiency     Past Surgical History:  Procedure Laterality Date  .  bone spurs toes Right   . CESAREAN SECTION    . KNEE SURGERY      OB History    Gravida Para Term Preterm AB Living   3 2 2   1 3    SAB TAB Ectopic Multiple Live Births         1 3      Allergies  Allergen Reactions  . Lexapro [Escitalopram Oxalate] Other (See Comments)    Flat affect, No emotions    Social History   Social History  . Marital status: Legally Separated    Spouse name: N/A  . Number of children: N/A  . Years of education: N/A   Social History Main Topics  . Smoking status: Current Every Day Smoker    Packs/day: 0.50    Years: 15.00    Types: Cigarettes    Start date: 03/13/1997  . Smokeless tobacco: Never Used     Comment: "working on it" Was smoking 2.5 packs a day  . Alcohol use No  . Drug use: No  . Sexual activity: Yes    Partners: Male    Birth control/ protection: Injection   Other  Topics Concern  . None   Social History Narrative  . None    Family History  Problem Relation Age of Onset  . Hypertension Father   . Atrial fibrillation Father   . Alcohol abuse Father   . Cancer Maternal Grandmother     skin   . Heart disease Maternal Grandmother   . Other Maternal Grandmother     had thyroid removed  . Breast cancer Maternal Grandmother   . Heart disease Paternal Grandfather   . COPD Paternal Grandfather   . Hypertension Paternal Grandfather   . Diabetes Paternal Grandfather   . Stroke Paternal Grandfather   . Anxiety disorder Mother   . Cancer Maternal Grandfather     bladder,lung  . Anxiety disorder Maternal Aunt   . Anxiety disorder Maternal Uncle   . Bipolar disorder Maternal Uncle   . Breast cancer Maternal Uncle     CML  . Leukemia Other   . Colon cancer Other     lung-2 maternal great uncles

## 2016-09-04 ENCOUNTER — Other Ambulatory Visit (HOSPITAL_COMMUNITY): Payer: Self-pay | Admitting: Psychiatry

## 2016-09-07 ENCOUNTER — Telehealth (HOSPITAL_COMMUNITY): Payer: Self-pay | Admitting: *Deleted

## 2016-09-07 ENCOUNTER — Other Ambulatory Visit: Payer: 59

## 2016-09-07 ENCOUNTER — Other Ambulatory Visit (HOSPITAL_COMMUNITY): Payer: Self-pay | Admitting: Psychiatry

## 2016-09-07 MED ORDER — OLANZAPINE 5 MG PO TBDP: 5.0000 mg | ORAL_TABLET | Freq: Every day | ORAL | 2 refills | Status: DC

## 2016-09-07 NOTE — Telephone Encounter (Signed)
zyprexa zydis sent in

## 2016-09-07 NOTE — Telephone Encounter (Signed)
Pt called stating she stopped her Latuda. Per pt, her body is not absorbing anything. Per pt, she was going a week without sleeping and the only changes she had was the [place]. So she stopped it to see if it is her and she is sleeping a little bit. Per pt she is now needing a medications that can be crushed/ liquid form or something due to what GI doctor informed her. Per pt her GI doctor told her that she have dumping syndrome which everything goes right through her. Pt number is [phone]. Pt is resch to see [employer].

## 2016-09-08 ENCOUNTER — Encounter (HOSPITAL_COMMUNITY): Payer: Self-pay | Admitting: Psychiatry

## 2016-09-08 ENCOUNTER — Ambulatory Visit (INDEPENDENT_AMBULATORY_CARE_PROVIDER_SITE_OTHER): Payer: 59 | Admitting: Psychiatry

## 2016-09-08 ENCOUNTER — Telehealth: Payer: Self-pay | Admitting: Obstetrics & Gynecology

## 2016-09-08 DIAGNOSIS — F332 Major depressive disorder, recurrent severe without psychotic features: Secondary | ICD-10-CM
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
## 2016-09-08 NOTE — Telephone Encounter (Signed)
Called pt and informed her with new medication and sig and pt verbalized understanding

## 2016-09-11 ENCOUNTER — Ambulatory Visit (INDEPENDENT_AMBULATORY_CARE_PROVIDER_SITE_OTHER): Payer: 59 | Admitting: *Deleted

## 2016-09-11 ENCOUNTER — Other Ambulatory Visit: Payer: Self-pay | Admitting: Adult Health

## 2016-09-11 ENCOUNTER — Other Ambulatory Visit: Payer: Self-pay | Admitting: Obstetrics & Gynecology

## 2016-09-11 ENCOUNTER — Ambulatory Visit: Payer: 59

## 2016-09-11 ENCOUNTER — Encounter: Payer: Self-pay | Admitting: *Deleted

## 2016-09-11 ENCOUNTER — Other Ambulatory Visit: Payer: 59

## 2016-09-11 DIAGNOSIS — Z308 Encounter for other contraceptive management: Secondary | ICD-10-CM

## 2016-09-11 DIAGNOSIS — E538 Deficiency of other specified B group vitamins: Secondary | ICD-10-CM

## 2016-09-11 DIAGNOSIS — Z3042 Encounter for surveillance of injectable contraceptive: Secondary | ICD-10-CM

## 2016-09-11 LAB — BETA HCG QUANT (REF LAB): hCG Quant: <1 m[IU]/mL

## 2016-09-11 MED ORDER — MEDROXYPROGESTERONE ACETATE 150 MG/ML IM SUSP
150.0000 mg | Freq: Once | INTRAMUSCULAR | Status: AC
  Administered 2016-09-11: 150 mg via INTRAMUSCULAR

## 2016-09-11 NOTE — Addendum Note (Signed)
Addended by: Doyne Keel on: 09/11/2016 08:56 AM   Modules accepted: Orders

## 2016-09-11 NOTE — Progress Notes (Signed)
Pt here for Depo. Negative quant this am. Pt tolerated shot well. Return in 12 weeks for next shot. [place]

## 2016-09-11 NOTE — Addendum Note (Signed)
Addended by: Doyne Keel on: 09/11/2016 09:00 AM   Modules accepted: Orders

## 2016-09-12 LAB — VITAMIN B12: VITAMIN B 12: 1070 pg/mL (ref 232–1245)

## 2016-09-18 ENCOUNTER — Telehealth (HOSPITAL_COMMUNITY): Payer: Self-pay | Admitting: *Deleted

## 2016-09-18 ENCOUNTER — Emergency Department (HOSPITAL_COMMUNITY)
Admission: EM | Admit: 2016-09-18 | Discharge: 2016-09-18 | Disposition: A | Discharge status: Home / Self Care | Payer: 59 | Attending: Emergency Medicine | Admitting: Emergency Medicine

## 2016-09-18 ENCOUNTER — Other Ambulatory Visit (HOSPITAL_COMMUNITY): Payer: Self-pay | Admitting: Psychiatry

## 2016-09-18 ENCOUNTER — Encounter (HOSPITAL_COMMUNITY): Payer: Self-pay | Admitting: Emergency Medicine

## 2016-09-18 ENCOUNTER — Emergency Department (HOSPITAL_COMMUNITY): Payer: 59

## 2016-09-18 DIAGNOSIS — Y999 Unspecified external cause status: Secondary | ICD-10-CM | POA: Diagnosis not present

## 2016-09-18 DIAGNOSIS — Y92410 Unspecified street and highway as the place of occurrence of the external cause: Secondary | ICD-10-CM | POA: Insufficient documentation

## 2016-09-18 DIAGNOSIS — M25552 Pain in left hip: Secondary | ICD-10-CM | POA: Diagnosis not present

## 2016-09-18 DIAGNOSIS — F17210 Nicotine dependence, cigarettes, uncomplicated: Secondary | ICD-10-CM | POA: Diagnosis not present

## 2016-09-18 DIAGNOSIS — M79642 Pain in left hand: Secondary | ICD-10-CM | POA: Diagnosis not present

## 2016-09-18 DIAGNOSIS — Y9389 Activity, other specified: Secondary | ICD-10-CM | POA: Insufficient documentation

## 2016-09-18 DIAGNOSIS — M25512 Pain in left shoulder: Secondary | ICD-10-CM | POA: Insufficient documentation

## 2016-09-18 DIAGNOSIS — S0191XA Laceration without foreign body of unspecified part of head, initial encounter: Secondary | ICD-10-CM | POA: Diagnosis not present

## 2016-09-18 DIAGNOSIS — S0990XA Unspecified injury of head, initial encounter: Secondary | ICD-10-CM | POA: Diagnosis present

## 2016-09-18 LAB — POC URINE PREG, ED: Preg Test, Ur: NEGATIVE

## 2016-09-18 MED ORDER — FLUOXETINE HCL 20 MG/5ML PO SOLN: 20.0000 mg | Freq: Every day | ORAL | 2 refills | Status: DC

## 2016-09-18 MED ORDER — IBUPROFEN 800 MG PO TABS: 800.0000 mg | ORAL_TABLET | Freq: Three times a day (TID) | ORAL | 0 refills | Status: DC | PRN
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
## 2016-09-18 NOTE — Telephone Encounter (Signed)
Why does it have to be dissolvable? Medications like prozac are effective for picking and it comes in a liquid

## 2016-09-18 NOTE — Telephone Encounter (Signed)
Called pt and informed her with what provider did and pt verbalized understanding.

## 2016-09-18 NOTE — Telephone Encounter (Signed)
Per pt her Gi doctor told her she is not absorbing good per previous call in epic.

## 2016-09-18 NOTE — Telephone Encounter (Addendum)
Pt called staitng she stopped the Latuda because it was giving her more anxiety. Pt is calling about the Olanzapine dis. Per pt she was looking it up and it stated it is not affect for Anxiety and she already paid for it. Per pt she don't know if there is anything else that is dissolvable for anxiety. Pt stated that she is consistently picking at her dry skin on her face. Per pt her anxiety is bad. Pt number is [phone].

## 2016-09-18 NOTE — Progress Notes (Unsigned)
prozacxanax

## 2016-09-18 NOTE — Telephone Encounter (Signed)
prozac oral solution sent in
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
## 2016-09-18 NOTE — Discharge Instructions (Signed)
Follow-up with your family doctor in 7-10 days to get her stitches out and to get your shoulder rechecked

## 2016-09-22 ENCOUNTER — Emergency Department (HOSPITAL_COMMUNITY)
Admission: EM | Admit: 2016-09-22 | Discharge: 2016-09-22 | Disposition: A | Discharge status: Home / Self Care | Payer: 59 | Attending: Emergency Medicine | Admitting: Emergency Medicine

## 2016-09-22 ENCOUNTER — Ambulatory Visit (INDEPENDENT_AMBULATORY_CARE_PROVIDER_SITE_OTHER): Payer: 59 | Admitting: Psychiatry

## 2016-09-22 ENCOUNTER — Encounter (HOSPITAL_COMMUNITY): Payer: Self-pay | Admitting: Emergency Medicine

## 2016-09-22 DIAGNOSIS — F332 Major depressive disorder, recurrent severe without psychotic features: Secondary | ICD-10-CM | POA: Diagnosis not present

## 2016-09-22 DIAGNOSIS — Z4801 Encounter for change or removal of surgical wound dressing: Secondary | ICD-10-CM | POA: Diagnosis present

## 2016-09-22 DIAGNOSIS — F0781 Postconcussional syndrome: Secondary | ICD-10-CM

## 2016-09-22 DIAGNOSIS — Z791 Long term (current) use of non-steroidal anti-inflammatories (NSAID): Secondary | ICD-10-CM | POA: Insufficient documentation

## 2016-09-22 DIAGNOSIS — Z79899 Other long term (current) drug therapy: Secondary | ICD-10-CM | POA: Diagnosis not present

## 2016-09-22 DIAGNOSIS — F17210 Nicotine dependence, cigarettes, uncomplicated: Secondary | ICD-10-CM | POA: Diagnosis not present

## 2016-09-22 DIAGNOSIS — F988 Other specified behavioral and emotional disorders with onset usually occurring in childhood and adolescence: Secondary | ICD-10-CM | POA: Insufficient documentation

## 2016-09-22 DIAGNOSIS — Z5189 Encounter for other specified aftercare: Secondary | ICD-10-CM

## 2016-09-22 MED ORDER — CYCLOBENZAPRINE HCL 10 MG PO TABS: 10.0000 mg | ORAL_TABLET | Freq: Three times a day (TID) | ORAL | 0 refills | Status: DC | PRN

## 2016-09-22 MED ORDER — DIAZEPAM 5 MG PO TABS
5.0000 mg | ORAL_TABLET | Freq: Once | ORAL | Status: AC
  Administered 2016-09-22: 5 mg via ORAL
  Filled 2016-09-22: qty 1

## 2016-09-22 MED ORDER — HYDROCODONE-ACETAMINOPHEN 7.5-325 MG/15ML PO SOLN: 10.0000 mL | Freq: Four times a day (QID) | ORAL | 0 refills | Status: DC | PRN

## 2016-09-22 NOTE — Discharge Instructions (Signed)
Your staples are ok.  They need to be removal in 7 days from the 18th.  Follow-up with your primary doctor.  I have also listed the orthopedic doctor for follow-up if needed.  Apply ice packs on/off to your neck and shoulder
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
## 2016-09-22 NOTE — ED Notes (Signed)
Patient ambulated down hallway with no difficulties or complaints.
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
## 2016-09-27 ENCOUNTER — Ambulatory Visit (INDEPENDENT_AMBULATORY_CARE_PROVIDER_SITE_OTHER): Payer: 59 | Admitting: Family Medicine

## 2016-09-27 ENCOUNTER — Encounter: Payer: Self-pay | Admitting: Family Medicine

## 2016-09-27 VITALS — BP 126/80 | HR 136 | Resp 12 | Ht 65.0 in | Wt 158.0 lb

## 2016-09-27 DIAGNOSIS — G44309 Post-traumatic headache, unspecified, not intractable: Secondary | ICD-10-CM

## 2016-09-27 DIAGNOSIS — T1490XA Injury, unspecified, initial encounter: Secondary | ICD-10-CM

## 2016-09-27 DIAGNOSIS — R000 Tachycardia, unspecified: Secondary | ICD-10-CM

## 2016-09-27 DIAGNOSIS — S0101XD Laceration without foreign body of scalp, subsequent encounter: Secondary | ICD-10-CM | POA: Diagnosis not present

## 2016-09-27 MED ORDER — DILTIAZEM 12 MG/ML ORAL SUSPENSION: 60.0000 mg | Freq: Three times a day (TID) | ORAL | 0 refills | Status: DC

## 2016-09-27 NOTE — Patient Instructions (Addendum)
A few things to remember from today's visit:   Sinus tachycardia - Plan: diltiazem (CARDIZEM) 10 mg/ml oral suspension  Post-traumatic headache, not intractable, unspecified chronicity pattern  Diltiazem started to help with sinus tachycardia, keep appt with cardiologists.   Please be sure medication list is accurate. If a new problem present, please set up appointment sooner than planned today.
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
## 2016-09-27 NOTE — Progress Notes (Signed)
Pre visit review using our clinic review tool, if applicable. No additional management support is needed unless otherwise documented below in the visit note. 

## 2016-10-03 ENCOUNTER — Telehealth: Payer: Self-pay | Admitting: Family Medicine

## 2016-10-03 ENCOUNTER — Other Ambulatory Visit (HOSPITAL_COMMUNITY): Payer: Self-pay | Admitting: Psychiatry

## 2016-10-03 ENCOUNTER — Encounter: Payer: Self-pay | Admitting: Internal Medicine

## 2016-10-03 ENCOUNTER — Ambulatory Visit (INDEPENDENT_AMBULATORY_CARE_PROVIDER_SITE_OTHER): Payer: 59 | Admitting: Internal Medicine

## 2016-10-03 VITALS — BP 120/88 | HR 116 | Temp 98.9°F | Resp 16 | Wt 166.0 lb

## 2016-10-03 DIAGNOSIS — R51 Headache: Secondary | ICD-10-CM

## 2016-10-03 DIAGNOSIS — R42 Dizziness and giddiness: Secondary | ICD-10-CM | POA: Diagnosis not present

## 2016-10-03 DIAGNOSIS — H538 Other visual disturbances: Secondary | ICD-10-CM | POA: Diagnosis not present

## 2016-10-03 DIAGNOSIS — R519 Headache, unspecified: Secondary | ICD-10-CM | POA: Insufficient documentation

## 2016-10-03 DIAGNOSIS — F0781 Postconcussional syndrome: Secondary | ICD-10-CM | POA: Diagnosis not present

## 2016-10-03 DIAGNOSIS — G44311 Acute post-traumatic headache, intractable: Secondary | ICD-10-CM

## 2016-10-03 MED ORDER — AMITRIPTYLINE HCL 10 MG PO TABS: ORAL_TABLET | ORAL | 3 refills | Status: DC

## 2016-10-03 NOTE — Progress Notes (Signed)
Pre visit review using our clinic review tool, if applicable. No additional management support is needed unless otherwise documented below in the visit note. 
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
## 2016-10-03 NOTE — Telephone Encounter (Signed)
Noted  
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
## 2016-10-03 NOTE — Patient Instructions (Signed)
A ct scan of your head was ordered - we will call you tomorrow to set this up.   A referral was ordered for neurology.   Start amitriptyline 10 mg nightly.  Increase to 20 mg nightly if tolerated.  Hold the Prozac.    Follow up with Dr Martinique.    See your eye doctor.

## 2016-10-03 NOTE — Assessment & Plan Note (Signed)
Her symptoms (headaches, dizziness, blurry vision, fogginess, memory issues, anxiety) are consistent with post concussion syndrome, which she has read about and understands Since her symptoms are ? Getting worse will repeat Ct of head - likely no bleed Will refer to neurology Start elavil for headaches/sleep - has been off prozac and will continue to hold for now Follow up with eye doctor Follow up with PCP

## 2016-10-04 ENCOUNTER — Telehealth: Payer: Self-pay | Admitting: Internal Medicine

## 2016-10-04 ENCOUNTER — Ambulatory Visit (INDEPENDENT_AMBULATORY_CARE_PROVIDER_SITE_OTHER)
Admission: RE | Admit: 2016-10-04 | Discharge: 2016-10-04 | Disposition: A | Discharge status: Home / Self Care | Payer: 59 | Source: Ambulatory Visit | Attending: Internal Medicine | Admitting: Internal Medicine

## 2016-10-04 DIAGNOSIS — F0781 Postconcussional syndrome: Secondary | ICD-10-CM

## 2016-10-04 DIAGNOSIS — R519 Headache, unspecified: Secondary | ICD-10-CM

## 2016-10-04 DIAGNOSIS — R51 Headache: Secondary | ICD-10-CM

## 2016-10-04 DIAGNOSIS — H538 Other visual disturbances: Secondary | ICD-10-CM | POA: Diagnosis not present

## 2016-10-04 DIAGNOSIS — R42 Dizziness and giddiness: Secondary | ICD-10-CM

## 2016-10-04 MED ORDER — IOPAMIDOL (ISOVUE-300) INJECTION 61%
80.0000 mL | Freq: Once | INTRAVENOUS | Status: AC | PRN
  Administered 2016-10-04: 80 mL via INTRAVENOUS

## 2016-10-04 NOTE — Telephone Encounter (Signed)
Pt called stated she saw Dr. Quay Burow yesterday, she has a some question concern about about CT result--what does it mean of paragraph below?  "Other: Left superior convexity scalp soft tissue injury is largely resolved. Other scalp and visible orbits soft tissues remain normal."  She also wants to know unable to taste any food can be from the head injury? Is it going to be perminante?  -Amitriptyline prescribed for her to take at bed time, she was wondering if she can take it during the day when she has headache as well?   Please call her back [phone]
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
## 2016-10-04 NOTE — Telephone Encounter (Signed)
Please advise 

## 2016-10-05 ENCOUNTER — Telehealth: Payer: Self-pay | Admitting: Orthopaedic Surgery

## 2016-10-05 ENCOUNTER — Ambulatory Visit (INDEPENDENT_AMBULATORY_CARE_PROVIDER_SITE_OTHER): Payer: 59

## 2016-10-05 ENCOUNTER — Other Ambulatory Visit: Payer: Self-pay | Admitting: Radiology

## 2016-10-05 ENCOUNTER — Ambulatory Visit (INDEPENDENT_AMBULATORY_CARE_PROVIDER_SITE_OTHER): Payer: 59 | Admitting: Orthopaedic Surgery

## 2016-10-05 ENCOUNTER — Encounter: Payer: Self-pay | Admitting: Orthopaedic Surgery

## 2016-10-05 VITALS — BP 119/80 | HR 120 | Temp 98.1°F | Ht 65.0 in | Wt 166.0 lb

## 2016-10-05 DIAGNOSIS — M25562 Pain in left knee: Secondary | ICD-10-CM

## 2016-10-05 DIAGNOSIS — F17210 Nicotine dependence, cigarettes, uncomplicated: Secondary | ICD-10-CM | POA: Diagnosis not present

## 2016-10-05 DIAGNOSIS — M25512 Pain in left shoulder: Secondary | ICD-10-CM | POA: Diagnosis not present

## 2016-10-05 MED ORDER — HYDROCODONE-ACETAMINOPHEN 7.5-325 MG PO TABS: ORAL_TABLET | ORAL | 0 refills | Status: DC

## 2016-10-05 NOTE — Patient Instructions (Signed)
Steps to Quit Smoking Smoking tobacco can be bad for your health. It can also affect almost every organ in your body. Smoking puts you and people around you at risk for many serious long-lasting (chronic) diseases. Quitting smoking is hard, but it is one of the best things that you can do for your health. It is never too late to quit. What are the benefits of quitting smoking? When you quit smoking, you lower your risk for getting serious diseases and conditions. They can include:  Lung cancer or lung disease.  Heart disease.  Stroke.  Heart attack.  Not being able to have children (infertility).  Weak bones (osteoporosis) and broken bones (fractures). If you have coughing, wheezing, and shortness of breath, those symptoms may get better when you quit. You may also get sick less often. If you are pregnant, quitting smoking can help to lower your chances of having a baby of low birth weight. What can I do to help me quit smoking? Talk with your doctor about what can help you quit smoking. Some things you can do (strategies) include:  Quitting smoking totally, instead of slowly cutting back how much you smoke over a period of time.  Going to in-person counseling. You are more likely to quit if you go to many counseling sessions.  Using resources and support systems, such as:  Online chats with a counselor.  Phone quitlines.  Printed self-help materials.  Support groups or group counseling.  Text messaging programs.  Mobile phone apps or applications.  Taking medicines. Some of these medicines may have nicotine in them. If you are pregnant or breastfeeding, do not take any medicines to quit smoking unless your doctor says it is okay. Talk with your doctor about counseling or other things that can help you. Talk with your doctor about using more than one strategy at the same time, such as taking medicines while you are also going to in-person counseling. This can help make quitting  easier. What things can I do to make it easier to quit? Quitting smoking might feel very hard at first, but there is a lot that you can do to make it easier. Take these steps:  Talk to your family and friends. Ask them to support and encourage you.  Call phone quitlines, reach out to support groups, or work with a counselor.  Ask people who smoke to not smoke around you.  Avoid places that make you want (trigger) to smoke, such as:  Bars.  Parties.  Smoke-break areas at work.  Spend time with people who do not smoke.  Lower the stress in your life. Stress can make you want to smoke. Try these things to help your stress:  Getting regular exercise.  Deep-breathing exercises.  Yoga.  Meditating.  Doing a body scan. To do this, close your eyes, focus on one area of your body at a time from head to toe, and notice which parts of your body are tense. Try to relax the muscles in those areas.  Download or buy apps on your mobile phone or tablet that can help you stick to your quit plan. There are many free apps, such as QuitGuide from the CDC (Centers for Disease Control and Prevention). You can find more support from smokefree.gov and other websites. This information is not intended to replace advice given to you by your health care provider. Make sure you discuss any questions you have with your health care provider. Document Released: 07/15/2009 Document Revised: 05/16/2016 Document   Reviewed: 02/02/2015 Elsevier Interactive Patient Education  2017 [employer].  

## 2016-10-05 NOTE — Telephone Encounter (Signed)
LVM with MDs response. Pt will call back if she would like the ENT referral.

## 2016-10-05 NOTE — Telephone Encounter (Signed)
Patient has questions regarding what was discussed at today's office visit, 10/05/16, related to MRI.  States she contacted her insurance company.  Please advise.  Cell# [phone]

## 2016-10-05 NOTE — Telephone Encounter (Signed)
I spoke with the patient and Dr. Luna Glasgow.  The MRI has been ordered and approved by [facility].  It was scheduled at [facility] and the patient was made aware of the date and time.
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
## 2016-10-06 ENCOUNTER — Ambulatory Visit (INDEPENDENT_AMBULATORY_CARE_PROVIDER_SITE_OTHER): Payer: 59 | Admitting: Psychiatry

## 2016-10-06 ENCOUNTER — Encounter (HOSPITAL_COMMUNITY): Payer: Self-pay | Admitting: Psychiatry

## 2016-10-06 ENCOUNTER — Telehealth: Payer: Self-pay | Admitting: Family Medicine

## 2016-10-06 DIAGNOSIS — F332 Major depressive disorder, recurrent severe without psychotic features: Secondary | ICD-10-CM | POA: Diagnosis not present

## 2016-10-06 DIAGNOSIS — R519 Headache, unspecified: Secondary | ICD-10-CM

## 2016-10-06 DIAGNOSIS — M549 Dorsalgia, unspecified: Secondary | ICD-10-CM

## 2016-10-06 DIAGNOSIS — R51 Headache: Principal | ICD-10-CM

## 2016-10-06 DIAGNOSIS — S3992XA Unspecified injury of lower back, initial encounter: Secondary | ICD-10-CM

## 2016-10-06 NOTE — Telephone Encounter (Signed)
Pt would like to know if you want her to get her back checked out she is having a lot of lower back pain and neck would like to have a referral if you think she needs one.  This is coming from [place] 09/18/16.
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
## 2016-10-09 NOTE — Telephone Encounter (Signed)
Patient returned my call. She spoke with Dr. Brooke Bonito office and they do not deal with back injuries. Is there a specialist that we can refer her too or can we order an MRI of her back? Patient also mentioned that she is having a lot of headaches and she is starting to forget some things. She was interested in a referral to neurology as well. Okay to place that referral?

## 2016-10-09 NOTE — Telephone Encounter (Signed)
Left voicemail letting patient know that she can follow up with Dr. Luna Glasgow on the pain since she does have an upcoming appointment with him, or we can discuss it during her next visit with [place] next week. Advised to call back if she has any questions.

## 2016-10-09 NOTE — Telephone Encounter (Signed)
[  These symptoms are not new, I believe she mentioned them during first visit. She also has Hx of fibromyalgia.]  If she wants to follow with neurologists and ortho it is ok to refer for headache and back pain respectively. Thanks, BJ

## 2016-10-10 ENCOUNTER — Ambulatory Visit (HOSPITAL_COMMUNITY)
Admission: RE | Admit: 2016-10-10 | Discharge: 2016-10-10 | Disposition: A | Discharge status: Home / Self Care | Payer: 59 | Source: Ambulatory Visit | Attending: Orthopaedic Surgery | Admitting: Orthopaedic Surgery

## 2016-10-10 ENCOUNTER — Encounter (HOSPITAL_COMMUNITY): Payer: Self-pay | Admitting: Psychiatry

## 2016-10-10 ENCOUNTER — Ambulatory Visit (INDEPENDENT_AMBULATORY_CARE_PROVIDER_SITE_OTHER): Payer: 59 | Admitting: Psychiatry

## 2016-10-10 VITALS — BP 129/80 | HR 125 | Ht 65.0 in | Wt 167.8 lb

## 2016-10-10 DIAGNOSIS — Z79899 Other long term (current) drug therapy: Secondary | ICD-10-CM

## 2016-10-10 DIAGNOSIS — S46022A Laceration of muscle(s) and tendon(s) of the rotator cuff of left shoulder, initial encounter: Secondary | ICD-10-CM | POA: Diagnosis not present

## 2016-10-10 DIAGNOSIS — F4310 Post-traumatic stress disorder, unspecified: Secondary | ICD-10-CM | POA: Diagnosis not present

## 2016-10-10 DIAGNOSIS — Z803 Family history of malignant neoplasm of breast: Secondary | ICD-10-CM

## 2016-10-10 DIAGNOSIS — F411 Generalized anxiety disorder: Secondary | ICD-10-CM

## 2016-10-10 DIAGNOSIS — X58XXXA Exposure to other specified factors, initial encounter: Secondary | ICD-10-CM | POA: Diagnosis not present

## 2016-10-10 DIAGNOSIS — Z801 Family history of malignant neoplasm of trachea, bronchus and lung: Secondary | ICD-10-CM

## 2016-10-10 DIAGNOSIS — Z888 Allergy status to other drugs, medicaments and biological substances status: Secondary | ICD-10-CM

## 2016-10-10 DIAGNOSIS — M25512 Pain in left shoulder: Secondary | ICD-10-CM | POA: Insufficient documentation

## 2016-10-10 DIAGNOSIS — F332 Major depressive disorder, recurrent severe without psychotic features: Secondary | ICD-10-CM | POA: Diagnosis not present

## 2016-10-10 DIAGNOSIS — Z808 Family history of malignant neoplasm of other organs or systems: Secondary | ICD-10-CM

## 2016-10-10 DIAGNOSIS — Z833 Family history of diabetes mellitus: Secondary | ICD-10-CM

## 2016-10-10 DIAGNOSIS — Z823 Family history of stroke: Secondary | ICD-10-CM

## 2016-10-10 DIAGNOSIS — Z811 Family history of alcohol abuse and dependence: Secondary | ICD-10-CM

## 2016-10-10 DIAGNOSIS — Z8249 Family history of ischemic heart disease and other diseases of the circulatory system: Secondary | ICD-10-CM

## 2016-10-10 DIAGNOSIS — Z8052 Family history of malignant neoplasm of bladder: Secondary | ICD-10-CM

## 2016-10-10 MED ORDER — ALPRAZOLAM 1 MG PO TABS: 1.0000 mg | ORAL_TABLET | Freq: Three times a day (TID) | ORAL | 2 refills | Status: DC | PRN

## 2016-10-10 MED ORDER — AMITRIPTYLINE HCL 25 MG PO TABS: ORAL_TABLET | ORAL | 2 refills | Status: DC
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
## 2016-10-10 NOTE — Telephone Encounter (Signed)
Left voicemail for patient letting her know that I am placing the referrals for her. Ortho referral made for back pain, and Neuro referral made for the headaches. Advised her to call back with any questions.

## 2016-10-11 ENCOUNTER — Telehealth: Payer: Self-pay | Admitting: Orthopaedic Surgery

## 2016-10-11 ENCOUNTER — Encounter: Payer: Self-pay | Admitting: Orthopedic Surgery

## 2016-10-11 NOTE — Progress Notes (Signed)
Patient concerned over mri result   I looked at it   It dos not need surgery

## 2016-10-11 NOTE — Telephone Encounter (Signed)
He ll be back tues

## 2016-10-11 NOTE — Telephone Encounter (Signed)
Pt states she picked up her MRI results and she had an impact fracture infra spinates insertion anterally, and a tear in supra spinates tendon. Pt would like to see an ortho asap. The one she was going to see in [place] cannot see her until next Tuesday . Pt would like a call back in the am.

## 2016-10-11 NOTE — Telephone Encounter (Signed)
Done

## 2016-10-11 NOTE — Telephone Encounter (Signed)
Patient has called back regarding MRI results;  her appointment with Dr Luna Glasgow, ordering provider, has been re-scheduled to 10/17/16, due to Dr Brooke Bonito emergency and out of office status.  Patient relays she has picked up a copy of her MRI report and film, and requests to be advised at this time about treatment plan, based on the results.  She mentioned tear and dislocation, and is therefore very concerned, and does not wish to wait until next Tuesday for her re-scheduled visit. Patient's Best Call Back# (cell) [phone]

## 2016-10-12 ENCOUNTER — Ambulatory Visit: Payer: Self-pay | Admitting: Orthopaedic Surgery

## 2016-10-12 NOTE — Telephone Encounter (Signed)
Called and spoke with sports medicine office in [place]. They would not be able to see patient until at least 1-2 weeks. [place] had openings today and tomorrow, and patient is currently scheduled for 10am tomorrow in [facility]. Called and spoke with patient, and made her aware of the appointment time. Gave her the address & telephone number, in case she wants to change the appointment to today. Patient verbalized understanding and nothing further is needed.

## 2016-10-12 NOTE — Telephone Encounter (Signed)
Destiny Orr pt returned your call. °

## 2016-10-12 NOTE — Telephone Encounter (Signed)
NO CHANGE IN INSTRUCTION, CONTINUE AS DIRECTED AT TIME OF OFFICE VISIT, RESULTS TO BE REVIEWED AT NEXT OFFICE VISIT

## 2016-10-12 NOTE — Telephone Encounter (Signed)
Left voicemail for patient to call the office back.  Advised patient that once she calls back and I know what appointment times work for her, I can call the Orthopedic office and see if she can see a different provider.

## 2016-10-12 NOTE — Telephone Encounter (Signed)
Called patient, notified, per Dr Ruthe Mannan response for patient to await appointment with Dr Luna Glasgow on Tuesday, 10/17/16  - patient has question regarding wearing of sling.  Transfer call to nurse.

## 2016-10-13 ENCOUNTER — Ambulatory Visit (INDEPENDENT_AMBULATORY_CARE_PROVIDER_SITE_OTHER): Payer: 59 | Admitting: Family Medicine

## 2016-10-13 ENCOUNTER — Encounter: Payer: Self-pay | Admitting: Family Medicine

## 2016-10-13 VITALS — BP 128/88 | HR 106 | Ht 66.0 in | Wt 166.0 lb

## 2016-10-13 DIAGNOSIS — S060X9A Concussion with loss of consciousness of unspecified duration, initial encounter: Secondary | ICD-10-CM | POA: Diagnosis not present

## 2016-10-13 DIAGNOSIS — S4992XA Unspecified injury of left shoulder and upper arm, initial encounter: Secondary | ICD-10-CM | POA: Diagnosis not present

## 2016-10-13 DIAGNOSIS — S199XXA Unspecified injury of neck, initial encounter: Secondary | ICD-10-CM | POA: Diagnosis not present

## 2016-10-13 MED ORDER — PREDNISONE 10 MG PO TABS: ORAL_TABLET | ORAL | 0 refills | Status: DC

## 2016-10-13 NOTE — Patient Instructions (Signed)
We will go ahead with an MRI of your cervical spine to assess for a disc herniation. Take prednisone dose pack as directed for 6 days. Ok to take tylenol with this but stop ibuprofen until the day after you finish the prednisone. Continue physical therapy for your neck and your shoulder. Your MRI showed a partial thickness rotator cuff tear but these generally heal with conservative treatment. Usually I like to add nitro patches but with your concussion, headaches I'm leary about starting this right now. I will contact you with MRI results and next steps.

## 2016-10-16 ENCOUNTER — Encounter: Payer: Self-pay | Admitting: Family Medicine

## 2016-10-16 ENCOUNTER — Ambulatory Visit (INDEPENDENT_AMBULATORY_CARE_PROVIDER_SITE_OTHER): Payer: 59 | Admitting: Family Medicine

## 2016-10-16 VITALS — BP 120/80 | HR 69 | Resp 12 | Ht 66.0 in | Wt 169.0 lb

## 2016-10-16 DIAGNOSIS — M542 Cervicalgia: Secondary | ICD-10-CM

## 2016-10-16 DIAGNOSIS — G44209 Tension-type headache, unspecified, not intractable: Secondary | ICD-10-CM

## 2016-10-16 DIAGNOSIS — M797 Fibromyalgia: Secondary | ICD-10-CM

## 2016-10-16 DIAGNOSIS — R000 Tachycardia, unspecified: Secondary | ICD-10-CM | POA: Diagnosis not present

## 2016-10-16 MED ORDER — KETOROLAC TROMETHAMINE 60 MG/2ML IM SOLN
60.0000 mg | Freq: Once | INTRAMUSCULAR | Status: AC
  Administered 2016-10-16: 60 mg via INTRAMUSCULAR

## 2016-10-16 MED ORDER — PROMETHAZINE HCL 25 MG/ML IJ SOLN
25.0000 mg | Freq: Once | INTRAMUSCULAR | Status: AC
  Administered 2016-10-16: 25 mg via INTRAMUSCULAR

## 2016-10-16 MED ORDER — CYCLOBENZAPRINE HCL 10 MG PO TABS: 10.0000 mg | ORAL_TABLET | Freq: Three times a day (TID) | ORAL | 0 refills | Status: DC | PRN

## 2016-10-16 NOTE — Patient Instructions (Addendum)
A few things to remember from today's visit:   Tension headache  Cervicalgia  Fibromyalgia syndrome - Plan: cyclobenzaprine (FLEXERIL) 10 MG tablet  Sinus tachycardia  Keep appointment with pain management next week. Keep appt with ortho tomorrow.  Today Toradol and phenergan.  Keep MRI appt.  Local heat. Continue PT.   Please be sure medication list is accurate. If a new problem present, please set up appointment sooner than planned today.
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
## 2016-10-16 NOTE — Progress Notes (Signed)
Pre visit review using our clinic review tool, if applicable. No additional management support is needed unless otherwise documented below in the visit note. 

## 2016-10-17 ENCOUNTER — Telehealth: Payer: Self-pay | Admitting: Orthopaedic Surgery

## 2016-10-17 ENCOUNTER — Telehealth (HOSPITAL_COMMUNITY): Payer: Self-pay | Admitting: *Deleted

## 2016-10-17 ENCOUNTER — Ambulatory Visit (INDEPENDENT_AMBULATORY_CARE_PROVIDER_SITE_OTHER): Payer: 59 | Admitting: Orthopaedic Surgery

## 2016-10-17 VITALS — BP 128/85 | HR 103 | Temp 97.7°F | Ht 66.0 in | Wt 168.0 lb

## 2016-10-17 DIAGNOSIS — F17210 Nicotine dependence, cigarettes, uncomplicated: Secondary | ICD-10-CM | POA: Diagnosis not present

## 2016-10-17 DIAGNOSIS — M25512 Pain in left shoulder: Secondary | ICD-10-CM | POA: Diagnosis not present

## 2016-10-17 NOTE — Telephone Encounter (Signed)
I spoke with patient after speaking with our clinical staff.  Relayed that Dr will need to advise .Marland Kitchen Provided option of [facility] Emergency room if pain is not subsiding, as well as offered appointment to see Dr Luna Glasgow tomorrow upon our scheduled delayed opening, due to impending weather.  Patient voiced understanding

## 2016-10-17 NOTE — Telephone Encounter (Signed)
Patient called this afternoon (had an appointment in our office today, 10/17/16) to relay that she has an onset of a sharp pain, upper back/shoulder, and radiating to breast bone. States has taken Flexoril, Tylenol, and also Prednisone. Please advise. Cell Ph [phone]

## 2016-10-17 NOTE — Telephone Encounter (Signed)
left voice message for April Holding, RN regarding records requested for patient.    Records requested from [facility], has been faxed three times 10/12/16, to [phone], again on 10/14/15 to same fax number.   [employer] representatives said they did not receive records.   Another fax number was given [phone], and records again faxed, but it still appears that they did not go through.   Waiting to hear from them regarding next step.
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
## 2016-10-17 NOTE — Patient Instructions (Addendum)
Continue therapy

## 2016-10-18 ENCOUNTER — Ambulatory Visit (HOSPITAL_COMMUNITY)
Admission: RE | Admit: 2016-10-18 | Discharge: 2016-10-18 | Disposition: A | Discharge status: Home / Self Care | Payer: 59 | Source: Ambulatory Visit | Attending: Family Medicine | Admitting: Family Medicine

## 2016-10-18 DIAGNOSIS — S199XXA Unspecified injury of neck, initial encounter: Secondary | ICD-10-CM | POA: Insufficient documentation

## 2016-10-18 DIAGNOSIS — X58XXXA Exposure to other specified factors, initial encounter: Secondary | ICD-10-CM | POA: Insufficient documentation

## 2016-10-18 DIAGNOSIS — M4802 Spinal stenosis, cervical region: Secondary | ICD-10-CM | POA: Diagnosis not present

## 2016-10-19 ENCOUNTER — Ambulatory Visit: Payer: Self-pay | Admitting: Orthopaedic Surgery

## 2016-10-20 ENCOUNTER — Ambulatory Visit (HOSPITAL_COMMUNITY): Payer: Self-pay | Admitting: Psychiatry

## 2016-10-20 ENCOUNTER — Telehealth: Payer: Self-pay | Admitting: Family Medicine

## 2016-10-20 ENCOUNTER — Ambulatory Visit (INDEPENDENT_AMBULATORY_CARE_PROVIDER_SITE_OTHER): Payer: 59 | Admitting: Orthopaedic Surgery

## 2016-10-20 VITALS — BP 144/90 | HR 99 | Temp 97.7°F | Ht 66.0 in | Wt 172.0 lb

## 2016-10-20 DIAGNOSIS — S060X9A Concussion with loss of consciousness of unspecified duration, initial encounter: Secondary | ICD-10-CM | POA: Insufficient documentation

## 2016-10-20 DIAGNOSIS — M25512 Pain in left shoulder: Secondary | ICD-10-CM

## 2016-10-20 DIAGNOSIS — S199XXA Unspecified injury of neck, initial encounter: Secondary | ICD-10-CM | POA: Insufficient documentation

## 2016-10-20 DIAGNOSIS — S4992XA Unspecified injury of left shoulder and upper arm, initial encounter: Secondary | ICD-10-CM | POA: Insufficient documentation

## 2016-10-20 DIAGNOSIS — M797 Fibromyalgia: Secondary | ICD-10-CM

## 2016-10-20 MED ORDER — HYDROCODONE-ACETAMINOPHEN 7.5-325 MG PO TABS: ORAL_TABLET | ORAL | 0 refills | Status: DC
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
## 2016-10-20 NOTE — Telephone Encounter (Signed)
Pt state that she has a appointment 10/25/16 @ 3:00pm with Dr. Tamera Punt and they need to have records of her MRI of her shoulder.

## 2016-10-20 NOTE — Telephone Encounter (Signed)
She was given an apt to come back in on 10/20/16
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
## 2016-10-20 NOTE — Assessment & Plan Note (Signed)
MRI showed partial thickness rotator cuff tear.  Should heal with conservative treatment given not full thickness.  Keep follow up with other sports medicine physician.  Continue PT.  Nitro typically helps but with current concussion and chronic migraines, would avoid.
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
## 2016-10-20 NOTE — Patient Instructions (Addendum)
Continue therapy    Steps to Quit Smoking Smoking tobacco can be bad for your health. It can also affect almost every organ in your body. Smoking puts you and people around you at risk for many serious Aireal Slater-lasting (chronic) diseases. Quitting smoking is hard, but it is one of the best things that you can do for your health. It is never too late to quit. What are the benefits of quitting smoking? When you quit smoking, you lower your risk for getting serious diseases and conditions. They can include:  Lung cancer or lung disease.  Heart disease.  Stroke.  Heart attack.  Not being able to have children (infertility).  Weak bones (osteoporosis) and broken bones (fractures). If you have coughing, wheezing, and shortness of breath, those symptoms may get better when you quit. You may also get sick less often. If you are pregnant, quitting smoking can help to lower your chances of having a baby of low birth weight. What can I do to help me quit smoking? Talk with your doctor about what can help you quit smoking. Some things you can do (strategies) include:  Quitting smoking totally, instead of slowly cutting back how much you smoke over a period of time.  Going to in-person counseling. You are more likely to quit if you go to many counseling sessions.  Using resources and support systems, such as:  Online chats with a Social worker.  Phone quitlines.  Printed Furniture conservator/restorer.  Support groups or group counseling.  Text messaging programs.  Mobile phone apps or applications.  Taking medicines. Some of these medicines may have nicotine in them. If you are pregnant or breastfeeding, do not take any medicines to quit smoking unless your doctor says it is okay. Talk with your doctor about counseling or other things that can help you. Talk with your doctor about using more than one strategy at the same time, such as taking medicines while you are also going to in-person counseling.  This can help make quitting easier. What things can I do to make it easier to quit? Quitting smoking might feel very hard at first, but there is a lot that you can do to make it easier. Take these steps:  Talk to your family and friends. Ask them to support and encourage you.  Call phone quitlines, reach out to support groups, or work with a Social worker.  Ask people who smoke to not smoke around you.  Avoid places that make you want (trigger) to smoke, such as:  Bars.  Parties.  Smoke-break areas at work.  Spend time with people who do not smoke.  Lower the stress in your life. Stress can make you want to smoke. Try these things to help your stress:  Getting regular exercise.  Deep-breathing exercises.  Yoga.  Meditating.  Doing a body scan. To do this, close your eyes, focus on one area of your body at a time from head to toe, and notice which parts of your body are tense. Try to relax the muscles in those areas.  Download or buy apps on your mobile phone or tablet that can help you stick to your quit plan. There are many free apps, such as QuitGuide from the [employer] Office manager for Disease Control and Prevention). You can find more support from smokefree.gov and other websites. This information is not intended to replace advice given to you by your health care provider. Make sure you discuss any questions you have with your health care provider. Document Released:  07/15/2009 Document Revised: 05/16/2016 Document Reviewed: 02/02/2015 Elsevier Interactive Patient Education  2017 [employer].

## 2016-10-20 NOTE — Progress Notes (Signed)
Patient Destiny Orr, female DOB:Aug 20, 1981, 36 y.o. [id number]  Chief Complaint  Patient presents with  . Follow-up    Left Shoulder c/o radiating pain    HPI  Destiny Orr is a 36 y.o. female who has left shoulder pain that has gotten worse. She has been to OT.  I have copies of her notes.  She had a MRI of her cervical spine that was done 10-18-16.  She has been on ibuprofen and exercises.  She has no new trauma.  MRI showed of the neck: IMPRESSION: 1. Mild cervical spondylosis and degenerative disc disease, with mild central narrowing of the thecal sac at C5-6 and mild right foraminal stenosis at C7-T1.  I have explained the findings.  She has read the report and is overly concerned about bulges noted within the report.  I told her not to be overly concerned about them.  She says she has bulges up and down her spine and that makes her worse.  She is to use her sling as needed and continue PT.  She had no response from the prednisone.  I will give narcotic pain medicine.  I have checked the state web site.  She is to continue OT. HPI  Body mass index is 27.76 kg/m.  ROS  Review of Systems  HENT: Negative for congestion.   Respiratory: Negative for cough and shortness of breath.   Cardiovascular: Negative for chest pain and leg swelling.  Endocrine: Positive for cold intolerance.  Musculoskeletal: Positive for arthralgias, gait problem and joint swelling.  Allergic/Immunologic: Positive for environmental allergies.  Neurological: Positive for headaches.  Psychiatric/Behavioral: The patient is nervous/anxious.     Past Medical History:  Diagnosis Date  . Abnormal uterine bleeding (AUB) 12/09/2014  . Anxiety   . Arthritis   . Depression   . DJD (degenerative joint disease)   . Dumping syndrome   . Dysmenorrhea 07/15/2014  . Fatigue 12/24/2012  . Fibromyalgia diagnosed April 2016  . Headache   . Hx of migraine headaches 12/24/2012  . Inappropriate sinus tachycardia   .  Irregular menstrual bleeding 07/15/2014  . Pelvic pain in female 03/03/2016  . Pneumothorax, spontaneous, tension   . POTS (postural orthostatic tachycardia syndrome)   . PTSD (post-traumatic stress disorder)   . Scoliosis   . Thyroid disease   . Tick bite 03/03/2016  . Vitamin B 12 deficiency     Past Surgical History:  Procedure Laterality Date  . bone spurs toes Right   . CESAREAN SECTION    . COLONOSCOPY    . endooscopy    . KNEE SURGERY      Family History  Problem Relation Age of Onset  . Hypertension Father   . Atrial fibrillation Father   . Alcohol abuse Father   . Cancer Maternal Grandmother     skin   . Heart disease Maternal Grandmother   . Other Maternal Grandmother     had thyroid removed  . Breast cancer Maternal Grandmother   . Heart disease Paternal Grandfather   . COPD Paternal Grandfather   . Hypertension Paternal Grandfather   . Diabetes Paternal Grandfather   . Stroke Paternal Grandfather   . Anxiety disorder Mother   . Cancer Maternal Grandfather     bladder,lung  . Anxiety disorder Maternal Aunt   . Anxiety disorder Maternal Uncle   . Bipolar disorder Maternal Uncle   . Breast cancer Maternal Uncle     CML  . Leukemia Other   . Colon cancer  Other     lung-2 maternal great uncles    Social History Social History  Substance Use Topics  . Smoking status: Current Every Day Smoker    Packs/day: 0.50    Years: 15.00    Types: Cigarettes    Start date: 03/13/1997  . Smokeless tobacco: Never Used     Comment: "working on it" Was smoking 2.5 packs a day  . Alcohol use No    Allergies  Allergen Reactions  . Topiramate Other (See Comments)    Topamax-dizziness  . Lexapro [Escitalopram Oxalate] Other (See Comments)    Flat affect, No emotions    Current Outpatient Prescriptions  Medication Sig Dispense Refill  . acetaminophen (TYLENOL) 160 MG/5ML suspension Take 320 mg by mouth every 6 (six) hours as needed for moderate pain or fever.    .  ALPRAZolam (XANAX) 1 MG tablet Take 1 tablet (1 mg total) by mouth 3 (three) times daily as needed for anxiety. 90 tablet 2  . amitriptyline (ELAVIL) 25 MG tablet Take one at bedtime, after one week increase to two at bedtime 60 tablet 2  . cyanocobalamin (,VITAMIN B-12,) 1000 MCG/ML injection TAKE TO OFFICE FOR INJECTION. (Patient taking differently: TAKE TO OFFICE FOR INJECTION MONTHLY) 1 mL 52  . cyclobenzaprine (FLEXERIL) 10 MG tablet Take 1 tablet (10 mg total) by mouth 3 (three) times daily as needed. Muscle spasms 21 tablet 0  . dicyclomine (BENTYL) 10 MG/5ML syrup Take 20 mg by mouth.    . diltiazem (CARDIZEM) 10 mg/ml oral suspension Take 6 mLs (60 mg total) by mouth 3 (three) times daily. 480 mL 0  . ibuprofen (ADVIL,MOTRIN) 800 MG tablet Take 1 tablet (800 mg total) by mouth every 8 (eight) hours as needed. 30 tablet 0  . medroxyPROGESTERone (DEPO-PROVERA) 150 MG/ML injection INJECT 1 VIAL INTRAMUSCULARLY EVERY 3 MONTHS IN OFFICE. 1 mL 2  . metoprolol succinate (TOPROL-XL) 50 MG 24 hr tablet     . predniSONE (DELTASONE) 10 MG tablet 6 tabs po day 1, 5 tabs po day 2, 4 tabs po day 3, 3 tabs po day 4, 2 tabs po day 5, 1 tab po day 6 21 tablet 0   No current facility-administered medications for this visit.      Physical Exam  Blood pressure (!) 144/90, pulse 99, temperature 97.7 F (36.5 C), height 5\' 6"  (1.676 m), weight 172 lb (78 kg).  Constitutional: overall normal hygiene, normal nutrition, well developed, normal grooming, normal body habitus. Assistive device:none  Musculoskeletal: gait and station Limp none, muscle tone and strength are normal, no tremors or atrophy is present.  .  Neurological: coordination overall normal.  Deep tendon reflex/nerve stretch intact.  Sensation normal.  Cranial nerves II-XII intact.   Skin:   Normal overall no scars, lesions, ulcers or rashes. No psoriasis.  Psychiatric: Alert and oriented x 3.  Recent memory intact, remote memory unclear.   Normal mood and affect. Well groomed.  Good eye contact.  Cardiovascular: overall no swelling, no varicosities, no edema bilaterally, normal temperatures of the legs and arms, no clubbing, cyanosis and good capillary refill.  Lymphatic: palpation is normal.  Her left shoulder is very tender and she prefers not to move it much.  She holds her arm to her chest.  She has no effusion.  NV intact.  I decided not to put it through a full ROM test as she is in so much pain today.  The patient has been educated about the nature of  the problem(s) and counseled on treatment options.  The patient appeared to understand what I have discussed and is in agreement with it.  Encounter Diagnoses  Name Primary?  . Acute pain of left shoulder Yes  . Fibromyalgia syndrome     PLAN Call if any problems.  Precautions discussed.  Continue current medications. Begin pain medicine.  State site checked prior.  Return to clinic 1 week   Electronically Signed Sanjuana Kava, MD 1/19/20188:43 AM
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
## 2016-10-23 ENCOUNTER — Telehealth: Payer: Self-pay | Admitting: Family Medicine

## 2016-10-23 ENCOUNTER — Telehealth (HOSPITAL_COMMUNITY): Payer: Self-pay | Admitting: *Deleted

## 2016-10-23 NOTE — Telephone Encounter (Signed)
MRI faxed to [facility].

## 2016-10-23 NOTE — Telephone Encounter (Signed)
left voice message regarding several attempts to fax requested medical records without success.

## 2016-10-23 NOTE — Telephone Encounter (Signed)
Returned phone call to [facility], left voice message for April Holding, RN regarding attempts to fax requested information with no success.

## 2016-10-23 NOTE — Telephone Encounter (Signed)
Called again - left voicemail to return call if better time to reach her.

## 2016-10-24 ENCOUNTER — Encounter: Payer: 59 | Attending: Physical Medicine & Rehabilitation | Admitting: Physical Medicine & Rehabilitation

## 2016-10-24 ENCOUNTER — Telehealth (HOSPITAL_COMMUNITY): Payer: Self-pay | Admitting: *Deleted

## 2016-10-24 ENCOUNTER — Encounter: Payer: Self-pay | Admitting: Physical Medicine & Rehabilitation

## 2016-10-24 DIAGNOSIS — G90A Postural orthostatic tachycardia syndrome (POTS): Secondary | ICD-10-CM

## 2016-10-24 DIAGNOSIS — R51 Headache: Secondary | ICD-10-CM | POA: Diagnosis not present

## 2016-10-24 DIAGNOSIS — Z823 Family history of stroke: Secondary | ICD-10-CM | POA: Insufficient documentation

## 2016-10-24 DIAGNOSIS — S060X9S Concussion with loss of consciousness of unspecified duration, sequela: Secondary | ICD-10-CM | POA: Diagnosis not present

## 2016-10-24 DIAGNOSIS — R531 Weakness: Secondary | ICD-10-CM | POA: Diagnosis not present

## 2016-10-24 DIAGNOSIS — F4310 Post-traumatic stress disorder, unspecified: Secondary | ICD-10-CM | POA: Insufficient documentation

## 2016-10-24 DIAGNOSIS — M5030 Other cervical disc degeneration, unspecified cervical region: Secondary | ICD-10-CM | POA: Diagnosis not present

## 2016-10-24 DIAGNOSIS — R200 Anesthesia of skin: Secondary | ICD-10-CM | POA: Insufficient documentation

## 2016-10-24 DIAGNOSIS — R42 Dizziness and giddiness: Secondary | ICD-10-CM | POA: Diagnosis not present

## 2016-10-24 DIAGNOSIS — F0781 Postconcussional syndrome: Secondary | ICD-10-CM | POA: Diagnosis not present

## 2016-10-24 DIAGNOSIS — Z79899 Other long term (current) drug therapy: Secondary | ICD-10-CM

## 2016-10-24 DIAGNOSIS — Z833 Family history of diabetes mellitus: Secondary | ICD-10-CM | POA: Insufficient documentation

## 2016-10-24 DIAGNOSIS — M419 Scoliosis, unspecified: Secondary | ICD-10-CM | POA: Diagnosis not present

## 2016-10-24 DIAGNOSIS — Z800 Family history of malignant neoplasm of digestive organs: Secondary | ICD-10-CM | POA: Insufficient documentation

## 2016-10-24 DIAGNOSIS — Z8249 Family history of ischemic heart disease and other diseases of the circulatory system: Secondary | ICD-10-CM | POA: Insufficient documentation

## 2016-10-24 DIAGNOSIS — I951 Orthostatic hypotension: Secondary | ICD-10-CM

## 2016-10-24 DIAGNOSIS — Z9889 Other specified postprocedural states: Secondary | ICD-10-CM | POA: Diagnosis not present

## 2016-10-24 DIAGNOSIS — M47892 Other spondylosis, cervical region: Secondary | ICD-10-CM | POA: Insufficient documentation

## 2016-10-24 DIAGNOSIS — E079 Disorder of thyroid, unspecified: Secondary | ICD-10-CM | POA: Insufficient documentation

## 2016-10-24 DIAGNOSIS — M4803 Spinal stenosis, cervicothoracic region: Secondary | ICD-10-CM | POA: Diagnosis not present

## 2016-10-24 DIAGNOSIS — Z803 Family history of malignant neoplasm of breast: Secondary | ICD-10-CM | POA: Diagnosis not present

## 2016-10-24 DIAGNOSIS — F329 Major depressive disorder, single episode, unspecified: Secondary | ICD-10-CM | POA: Insufficient documentation

## 2016-10-24 DIAGNOSIS — M25551 Pain in right hip: Secondary | ICD-10-CM | POA: Insufficient documentation

## 2016-10-24 DIAGNOSIS — Z811 Family history of alcohol abuse and dependence: Secondary | ICD-10-CM | POA: Insufficient documentation

## 2016-10-24 DIAGNOSIS — S4992XS Unspecified injury of left shoulder and upper arm, sequela: Secondary | ICD-10-CM

## 2016-10-24 DIAGNOSIS — F17210 Nicotine dependence, cigarettes, uncomplicated: Secondary | ICD-10-CM | POA: Insufficient documentation

## 2016-10-24 DIAGNOSIS — R000 Tachycardia, unspecified: Secondary | ICD-10-CM | POA: Diagnosis not present

## 2016-10-24 DIAGNOSIS — M797 Fibromyalgia: Secondary | ICD-10-CM | POA: Diagnosis present

## 2016-10-24 DIAGNOSIS — Z5181 Encounter for therapeutic drug level monitoring: Secondary | ICD-10-CM

## 2016-10-24 DIAGNOSIS — Z8669 Personal history of other diseases of the nervous system and sense organs: Secondary | ICD-10-CM | POA: Diagnosis not present

## 2016-10-24 DIAGNOSIS — Z808 Family history of malignant neoplasm of other organs or systems: Secondary | ICD-10-CM | POA: Diagnosis not present

## 2016-10-24 DIAGNOSIS — N946 Dysmenorrhea, unspecified: Secondary | ICD-10-CM | POA: Diagnosis not present

## 2016-10-24 NOTE — Telephone Encounter (Signed)
Left voicemail letting patient know Mri was faxed & to let us know if anything else is needed.

## 2016-10-24 NOTE — Patient Instructions (Addendum)
1. CUT OUT CIGARETTES!!! 2. KEEP A JOURNAL OF YOUR HEART RATE 3. KEEP MOVING YOUR LEFT SHOULDER!!!---FOLLOW UP WITH ORTHO 4. WE COULD POTENTIALLY INCREASE YOUR AMITRIPTYLINE IN ABOUT 3-4 WEEKS AGAIN TO TREAT SLEEP/HEADACHES AND FIBROMYALGIA 5. SLEEP T[employer] OF AT LEAST 8 HOURS    PLEASE FEEL FREE TO CALL OUR OFFICE WITH ANY PROBLEMS OR QUESTIONS [phone])

## 2016-10-24 NOTE — Addendum Note (Signed)
Addended by: Alger Simons T on: 10/24/2016 01:57 PM   Modules accepted: Level of Service
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
## 2016-10-24 NOTE — Addendum Note (Signed)
Addended by: Geryl Rankins D on: 10/24/2016 02:20 PM   Modules accepted: Orders

## 2016-10-24 NOTE — Telephone Encounter (Signed)
[phone] ext. [phone], phone call from [facility] at [facility] requesting dates of next appointment for patient.  Since no one had responded to phone calls, I asked if they received requested information for patient records, she apologized and said they received it.

## 2016-10-25 ENCOUNTER — Encounter: Payer: Self-pay | Admitting: Neurology

## 2016-10-25 ENCOUNTER — Telehealth: Payer: Self-pay | Admitting: Neurology

## 2016-10-25 ENCOUNTER — Ambulatory Visit (INDEPENDENT_AMBULATORY_CARE_PROVIDER_SITE_OTHER): Payer: 59 | Admitting: Neurology

## 2016-10-25 ENCOUNTER — Ambulatory Visit (HOSPITAL_COMMUNITY): Payer: Self-pay | Admitting: Psychiatry

## 2016-10-25 VITALS — BP 124/78 | HR 127 | Ht 66.0 in | Wt 174.2 lb

## 2016-10-25 DIAGNOSIS — F0781 Postconcussional syndrome: Secondary | ICD-10-CM | POA: Diagnosis not present

## 2016-10-25 DIAGNOSIS — R51 Headache: Secondary | ICD-10-CM | POA: Diagnosis not present
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
## 2016-10-25 NOTE — Telephone Encounter (Signed)
Patient just wants to give some information to dr Delice Lesch please call [phone]

## 2016-10-25 NOTE — Patient Instructions (Signed)
1. Continue amitriptyline 50mg  at night, this may be increased further in 3-4 weeks if needed 2. It is very important to reduce intake of Tylenol and Ibuprofen to 2-3 times a week, otherwise the headaches will not get better 3. After a concussion, physical and brain rest are of utmost importance 4. Continue follow-up with psychiatry and therapy 5. Continue follow-up with all your other physicians 6. Follow-up in 3 months

## 2016-10-26 ENCOUNTER — Ambulatory Visit: Payer: 59 | Admitting: Orthopaedic Surgery

## 2016-10-26 ENCOUNTER — Ambulatory Visit (HOSPITAL_COMMUNITY): Payer: 59 | Admitting: Psychiatry

## 2016-10-29 LAB — TOXASSURE SELECT,+ANTIDEPR,UR

## 2016-10-31 ENCOUNTER — Encounter: Payer: Self-pay | Admitting: Neurology

## 2016-11-03 ENCOUNTER — Telehealth: Payer: Self-pay

## 2016-11-03 NOTE — Progress Notes (Signed)
Urine drug screen for this encounter is Inconsistent. Oxycodone was present in patient urine. Oxycodone has not been prescribed according to [place].

## 2016-11-03 NOTE — Telephone Encounter (Signed)
Noted.   please follow up with patient on source.  thx

## 2016-11-03 NOTE — Telephone Encounter (Signed)
Urine drug screen for this encounter is Inconsistent. Oxycodone was present in patient urine. Oxycodone has not been prescribed according to [place]. Please see lab report and advised.

## 2016-11-06 ENCOUNTER — Encounter: Payer: Self-pay | Admitting: Physical Medicine & Rehabilitation

## 2016-11-06 ENCOUNTER — Encounter (HOSPITAL_COMMUNITY): Payer: Self-pay | Admitting: Psychiatry

## 2016-11-06 ENCOUNTER — Ambulatory Visit (INDEPENDENT_AMBULATORY_CARE_PROVIDER_SITE_OTHER): Payer: 59 | Admitting: Psychiatry

## 2016-11-06 VITALS — BP 115/78 | HR 125 | Ht 66.0 in | Wt 174.4 lb

## 2016-11-06 DIAGNOSIS — F332 Major depressive disorder, recurrent severe without psychotic features: Secondary | ICD-10-CM | POA: Diagnosis not present

## 2016-11-06 DIAGNOSIS — Z806 Family history of leukemia: Secondary | ICD-10-CM

## 2016-11-06 DIAGNOSIS — F4310 Post-traumatic stress disorder, unspecified: Secondary | ICD-10-CM

## 2016-11-06 DIAGNOSIS — Z888 Allergy status to other drugs, medicaments and biological substances status: Secondary | ICD-10-CM

## 2016-11-06 DIAGNOSIS — Z811 Family history of alcohol abuse and dependence: Secondary | ICD-10-CM

## 2016-11-06 DIAGNOSIS — Z8052 Family history of malignant neoplasm of bladder: Secondary | ICD-10-CM

## 2016-11-06 DIAGNOSIS — Z8249 Family history of ischemic heart disease and other diseases of the circulatory system: Secondary | ICD-10-CM

## 2016-11-06 DIAGNOSIS — Z800 Family history of malignant neoplasm of digestive organs: Secondary | ICD-10-CM

## 2016-11-06 DIAGNOSIS — Z801 Family history of malignant neoplasm of trachea, bronchus and lung: Secondary | ICD-10-CM

## 2016-11-06 DIAGNOSIS — Z808 Family history of malignant neoplasm of other organs or systems: Secondary | ICD-10-CM

## 2016-11-06 DIAGNOSIS — Z79899 Other long term (current) drug therapy: Secondary | ICD-10-CM

## 2016-11-06 DIAGNOSIS — Z803 Family history of malignant neoplasm of breast: Secondary | ICD-10-CM

## 2016-11-06 DIAGNOSIS — Z833 Family history of diabetes mellitus: Secondary | ICD-10-CM

## 2016-11-06 DIAGNOSIS — Z823 Family history of stroke: Secondary | ICD-10-CM

## 2016-11-06 DIAGNOSIS — F411 Generalized anxiety disorder: Secondary | ICD-10-CM

## 2016-11-06 DIAGNOSIS — Z818 Family history of other mental and behavioral disorders: Secondary | ICD-10-CM

## 2016-11-06 MED ORDER — AMITRIPTYLINE HCL 100 MG PO TABS: 100.0000 mg | ORAL_TABLET | Freq: Every day | ORAL | 2 refills | Status: DC
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
## 2016-11-07 NOTE — Telephone Encounter (Signed)
A MyChart email was responded to and I requested an explanation.

## 2016-11-16 DIAGNOSIS — Z029 Encounter for administrative examinations, unspecified: Secondary | ICD-10-CM

## 2016-11-17 ENCOUNTER — Telehealth: Payer: Self-pay
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
## 2016-11-17 NOTE — Telephone Encounter (Signed)
Let's do a Neurocognitive evaluation for her memory. Please have her scheduled with Dr. Si Raider. Thanks

## 2016-11-17 NOTE — Telephone Encounter (Signed)
Advised patient of below. Transferred patient to front to schedule testing.

## 2016-11-22 ENCOUNTER — Encounter: Payer: 59 | Attending: Physical Medicine & Rehabilitation | Admitting: Physical Medicine & Rehabilitation

## 2016-11-22 ENCOUNTER — Telehealth: Payer: Self-pay | Admitting: Family Medicine

## 2016-11-22 DIAGNOSIS — M4803 Spinal stenosis, cervicothoracic region: Secondary | ICD-10-CM | POA: Insufficient documentation

## 2016-11-22 DIAGNOSIS — F0781 Postconcussional syndrome: Secondary | ICD-10-CM | POA: Insufficient documentation

## 2016-11-22 DIAGNOSIS — M47892 Other spondylosis, cervical region: Secondary | ICD-10-CM | POA: Insufficient documentation

## 2016-11-22 DIAGNOSIS — F329 Major depressive disorder, single episode, unspecified: Secondary | ICD-10-CM | POA: Insufficient documentation

## 2016-11-22 DIAGNOSIS — Z800 Family history of malignant neoplasm of digestive organs: Secondary | ICD-10-CM | POA: Insufficient documentation

## 2016-11-22 DIAGNOSIS — Z833 Family history of diabetes mellitus: Secondary | ICD-10-CM | POA: Insufficient documentation

## 2016-11-22 DIAGNOSIS — R531 Weakness: Secondary | ICD-10-CM | POA: Insufficient documentation

## 2016-11-22 DIAGNOSIS — Z823 Family history of stroke: Secondary | ICD-10-CM | POA: Insufficient documentation

## 2016-11-22 DIAGNOSIS — M25551 Pain in right hip: Secondary | ICD-10-CM | POA: Insufficient documentation

## 2016-11-22 DIAGNOSIS — Z808 Family history of malignant neoplasm of other organs or systems: Secondary | ICD-10-CM | POA: Insufficient documentation

## 2016-11-22 DIAGNOSIS — F17210 Nicotine dependence, cigarettes, uncomplicated: Secondary | ICD-10-CM | POA: Insufficient documentation

## 2016-11-22 DIAGNOSIS — R51 Headache: Secondary | ICD-10-CM | POA: Insufficient documentation

## 2016-11-22 DIAGNOSIS — Z803 Family history of malignant neoplasm of breast: Secondary | ICD-10-CM | POA: Insufficient documentation

## 2016-11-22 DIAGNOSIS — Z9889 Other specified postprocedural states: Secondary | ICD-10-CM | POA: Insufficient documentation

## 2016-11-22 DIAGNOSIS — N946 Dysmenorrhea, unspecified: Secondary | ICD-10-CM | POA: Insufficient documentation

## 2016-11-22 DIAGNOSIS — Z811 Family history of alcohol abuse and dependence: Secondary | ICD-10-CM | POA: Insufficient documentation

## 2016-11-22 DIAGNOSIS — R200 Anesthesia of skin: Secondary | ICD-10-CM | POA: Insufficient documentation

## 2016-11-22 DIAGNOSIS — M797 Fibromyalgia: Secondary | ICD-10-CM | POA: Insufficient documentation

## 2016-11-22 DIAGNOSIS — E079 Disorder of thyroid, unspecified: Secondary | ICD-10-CM | POA: Insufficient documentation

## 2016-11-22 DIAGNOSIS — M5030 Other cervical disc degeneration, unspecified cervical region: Secondary | ICD-10-CM | POA: Insufficient documentation

## 2016-11-22 DIAGNOSIS — F4310 Post-traumatic stress disorder, unspecified: Secondary | ICD-10-CM | POA: Insufficient documentation

## 2016-11-22 DIAGNOSIS — Z8249 Family history of ischemic heart disease and other diseases of the circulatory system: Secondary | ICD-10-CM | POA: Insufficient documentation

## 2016-11-22 DIAGNOSIS — M419 Scoliosis, unspecified: Secondary | ICD-10-CM | POA: Insufficient documentation

## 2016-11-22 DIAGNOSIS — R42 Dizziness and giddiness: Secondary | ICD-10-CM | POA: Insufficient documentation

## 2016-11-22 NOTE — Telephone Encounter (Signed)
Pt state that her R arm is in a lot of pain and not sure if it is from the dry cough. And would like to know what she should do about the coughing it is effecting her arm.  Pharm:  [facility] in [place].

## 2016-11-23 ENCOUNTER — Ambulatory Visit (INDEPENDENT_AMBULATORY_CARE_PROVIDER_SITE_OTHER): Payer: 59 | Admitting: Family Medicine

## 2016-11-23 ENCOUNTER — Encounter: Payer: Self-pay | Admitting: Family Medicine

## 2016-11-23 VITALS — BP 124/80 | HR 100 | Resp 12 | Ht 66.0 in | Wt 184.4 lb

## 2016-11-23 DIAGNOSIS — R042 Hemoptysis: Secondary | ICD-10-CM | POA: Diagnosis not present

## 2016-11-23 DIAGNOSIS — R071 Chest pain on breathing: Secondary | ICD-10-CM | POA: Diagnosis not present

## 2016-11-23 DIAGNOSIS — R000 Tachycardia, unspecified: Secondary | ICD-10-CM

## 2016-11-23 DIAGNOSIS — M25511 Pain in right shoulder: Secondary | ICD-10-CM | POA: Diagnosis not present

## 2016-11-23 MED ORDER — ALBUTEROL SULFATE HFA 108 (90 BASE) MCG/ACT IN AERS: 2.0000 | INHALATION_SPRAY | Freq: Four times a day (QID) | RESPIRATORY_TRACT | 1 refills | Status: DC | PRN

## 2016-11-23 NOTE — Telephone Encounter (Signed)
Pt decide to make an appt today

## 2016-11-23 NOTE — Progress Notes (Signed)
HPI:   ACUTE VISIT:  Chief Complaint  Patient presents with  . Cough    [name] is a 36 y.o. female, who is here today complaining of cough and throat discomfort ,both attributed to recent intubation/anesthesia to perform left shoulder surgery (11/14/16). Cough started a day after surgery, denies associated fever,chills, or worsening fatigue. A couple times she has noted bloody/clear mucus. + Mild "raspy" cough. Right-sided chest pain,points infraclavicular area, and right shoulder pain after left shoulder surgery. Chest pain upon deep breathing, alleviated  by holding breath an avoiding breathing deep. Also exacerbated by cough. Right shoulder pain exacerbated by movement and alleviated by rest. + Limitation of ROM. Denies prior Hx of right shoulder pain and has not had recent trauma.  She is still following with ortho. She is doing PT exercises for right shoulder. She denies nose bleed,gum bleed, or gross hematuria. She is concerned about having pneumonia.  + Smoker, occasional wheezing, no dyspnea.  -Hx of tachycardia, last OV Diltiazem was recommended after she discontinued Metoprolol due to difficulty swallowing medication.She did not take medication, could not swallow pill but back to Metoprolol tab.  Hx of fibromyalgia,chronic pain, and major depressive disorder among others.   Review of Systems  Constitutional: Positive for fatigue. Negative for appetite change and fever.  HENT: Positive for postnasal drip and sore throat. Negative for mouth sores, nosebleeds, sneezing and voice change.   Respiratory: Positive for cough and wheezing. Negative for shortness of breath.   Cardiovascular: Positive for chest pain. Negative for leg swelling.  Gastrointestinal: Negative for abdominal pain and vomiting.       No changes in bowel habits.  Genitourinary: Negative for decreased urine volume and hematuria.  Musculoskeletal: Positive for arthralgias, back pain, myalgias  and neck pain. Negative for joint swelling.  Skin: Negative for rash.  Neurological: Positive for headaches (chronic). Negative for seizures, syncope and weakness.  Hematological: Negative for adenopathy. Does not bruise/bleed easily.      Current Outpatient Prescriptions on File Prior to Visit  Medication Sig Dispense Refill  . acetaminophen (TYLENOL) 80 MG chewable tablet Chew 80 mg by mouth every 6 (six) hours as needed.    . ALPRAZolam (XANAX) 1 MG tablet Take 1 tablet (1 mg total) by mouth 3 (three) times daily as needed for anxiety. 90 tablet 2  . amitriptyline (ELAVIL) 100 MG tablet Take 1 tablet (100 mg total) by mouth at bedtime. 30 tablet 2  . cyanocobalamin (,VITAMIN B-12,) 1000 MCG/ML injection TAKE TO OFFICE FOR INJECTION. (Patient taking differently: TAKE TO OFFICE FOR INJECTION MONTHLY) 1 mL 52  . dicyclomine (BENTYL) 10 MG/5ML syrup Take 20 mg by mouth.    Marland Kitchen ibuprofen (ADVIL,MOTRIN) 800 MG tablet Take 1 tablet (800 mg total) by mouth every 8 (eight) hours as needed. 30 tablet 0  . medroxyPROGESTERone (DEPO-PROVERA) 150 MG/ML injection INJECT 1 VIAL INTRAMUSCULARLY EVERY 3 MONTHS IN OFFICE. 1 mL 2  . metoprolol succinate (TOPROL-XL) 50 MG 24 hr tablet Take 50 mg by mouth daily.      No current facility-administered medications on file prior to visit.      Past Medical History:  Diagnosis Date  . Abnormal uterine bleeding (AUB) 12/09/2014  . Anxiety   . Arthritis   . Depression   . DJD (degenerative joint disease)   . Dumping syndrome   . Dysmenorrhea 07/15/2014  . Fatigue 12/24/2012  . Fibromyalgia diagnosed April 2016  . Headache   . Hx of migraine headaches  12/24/2012  . Inappropriate sinus tachycardia   . Irregular menstrual bleeding 07/15/2014  . Pelvic pain in female 03/03/2016  . Pneumothorax, spontaneous, tension   . POTS (postural orthostatic tachycardia syndrome)   . PTSD (post-traumatic stress disorder)   . Scoliosis   . Thyroid disease   . Tick bite  03/03/2016  . Vitamin B 12 deficiency    Allergies  Allergen Reactions  . Topiramate Other (See Comments)    Topamax-dizziness  . Lexapro [Escitalopram Oxalate] Other (See Comments)    Flat affect, No emotions    Social History   Social History  . Marital status: Legally Separated    Spouse name: N/A  . Number of children: N/A  . Years of education: N/A   Social History Main Topics  . Smoking status: Current Every Day Smoker    Packs/day: 0.50    Years: 15.00    Types: Cigarettes    Start date: 03/13/1997  . Smokeless tobacco: Never Used     Comment: "working on it" Was smoking 2.5 packs a day  . Alcohol use No  . Drug use: No  . Sexual activity: Not Currently    Partners: Male    Birth control/ protection: Injection   Other Topics Concern  . None   Social History Narrative  . None    Vitals:   11/23/16 1547  BP: 124/80  Pulse: 100  Resp: 12  O2 sat 96% at RA. Body mass index is 29.76 kg/m.   Physical Exam  Nursing note and vitals reviewed. Constitutional: She is oriented to person, place, and time. She appears well-developed. No distress.  HENT:  Head: Atraumatic.  Mouth/Throat: Oropharynx is clear and moist and mucous membranes are normal.  Poor dentition.  Eyes: Conjunctivae and EOM are normal. Pupils are equal, round, and reactive to light.  Cardiovascular: Regular rhythm.  Tachycardia present.   No murmur heard. Respiratory: Effort normal and breath sounds normal. No stridor. No respiratory distress. She has no rales. She exhibits no tenderness.  Musculoskeletal:       Right shoulder: She exhibits decreased range of motion. She exhibits no tenderness and no swelling.       Cervical back: She exhibits decreased range of motion and tenderness (upon movement and with palpation of trapezium and paraspinal cervical muscles,bilateral.). She exhibits no edema.  Lymphadenopathy:    She has no cervical adenopathy.  Neurological: She is alert and oriented to  person, place, and time. She has normal strength. Coordination normal.  Skin: Skin is warm. No rash noted. No erythema.  Psychiatric: Her mood appears anxious.  Appropriately groomed, poor eye contact.      ASSESSMENT AND PLAN:   Yasmene was seen today for cough.  Diagnoses and all orders for this visit:  Cough with hemoptysis  Today lung auscultation is normal.  No wheezing today but because reporting wheezing, Albuterol inh 2 puff every 6 hours for a week then as needed for wheezing or shortness of breath.  Encouraged smoking cessation.  Blood in mucus could be related to recent intubation, throat irritation. Instructed about warning signs.  -     DG Chest 2 View; Future -     albuterol (PROVENTIL HFA;VENTOLIN HFA) 108 (90 Base) MCG/ACT inhaler; Inhale 2 puffs into the lungs every 6 (six) hours as needed for wheezing or shortness of breath.  Right shoulder pain, unspecified chronicity  Recommended continuing PT and to follow with ortho. No recent trauma, so I do no tthink imaging is  needed.  Chest pain on breathing  Seems musculoskeletal. CXR ordered today. Instructed about warning signs. Avoid shallow breathing recommended.She has incentive spirometer home.  -     DG Chest 2 View; Future  Sinus tachycardia  Improved. Continue Metoprolol Succinate and continue following with cardiologists.    -[name] was advised to return or notify a doctor immediately if suddent worsening symptoms or new concerns arise.       Rai Severns G. Martinique, MD  [facility]. [place] office.

## 2016-11-23 NOTE — Progress Notes (Signed)
Pre visit review using our clinic review tool, if applicable. No additional management support is needed unless otherwise documented below in the visit note. 

## 2016-11-23 NOTE — Patient Instructions (Addendum)
A few things to remember from today's visit:   Cough with hemoptysis - Plan: DG Chest 2 View, albuterol (PROVENTIL HFA;VENTOLIN HFA) 108 (90 Base) MCG/ACT inhaler  Right shoulder pain, unspecified chronicity  Chest pain on breathing  Blood in sputum could be from throat due to intubation. I did not hear pneumonia on exam. Deep breathing a few times per day, blow little ball as instructed.  Chest pain seems musculoskeletal, avoid shallow breathing.  Smoking cessation  And Albuterol inh 2 puff every 6 hours for a week then as needed for wheezing or shortness of breath.    Please be sure medication list is accurate. If a new problem present, please set up appointment sooner than planned today.

## 2016-11-29 ENCOUNTER — Other Ambulatory Visit: Payer: 59

## 2016-11-29 ENCOUNTER — Other Ambulatory Visit: Payer: Self-pay | Admitting: Obstetrics & Gynecology

## 2016-11-29 DIAGNOSIS — N83201 Unspecified ovarian cyst, right side: Secondary | ICD-10-CM

## 2016-11-30 ENCOUNTER — Ambulatory Visit: Payer: 59 | Admitting: Obstetrics & Gynecology

## 2016-11-30 ENCOUNTER — Other Ambulatory Visit: Payer: 59

## 2016-12-04 ENCOUNTER — Ambulatory Visit: Payer: 59

## 2016-12-04 ENCOUNTER — Ambulatory Visit: Payer: 59 | Admitting: Obstetrics & Gynecology

## 2016-12-04 ENCOUNTER — Other Ambulatory Visit: Payer: 59

## 2016-12-06 ENCOUNTER — Ambulatory Visit: Payer: Self-pay | Admitting: Neurology

## 2016-12-06 ENCOUNTER — Telehealth: Payer: Self-pay

## 2016-12-06 NOTE — Telephone Encounter (Signed)
Spoke with pt and she reports continued issues with elevated heart rate, fatigue, ShOB, and excessive sweating. She thinks she has hyperadrenergenc POTS. She would like to know what other medication she can try to make her symptoms go away. She is currently on Metoprolol 25mg  qAM and 25mg  qPM. She also thinks she may have OSA and would like to know about testing for this.  Dr. Martinique - Please advise. Thanks!

## 2016-12-07 ENCOUNTER — Ambulatory Visit (INDEPENDENT_AMBULATORY_CARE_PROVIDER_SITE_OTHER): Payer: 59 | Admitting: *Deleted

## 2016-12-07 DIAGNOSIS — Z3202 Encounter for pregnancy test, result negative: Secondary | ICD-10-CM

## 2016-12-07 DIAGNOSIS — Z3042 Encounter for surveillance of injectable contraceptive: Secondary | ICD-10-CM

## 2016-12-07 LAB — POCT URINE PREGNANCY: Preg Test, Ur: NEGATIVE

## 2016-12-07 MED ORDER — MEDROXYPROGESTERONE ACETATE 150 MG/ML IM SUSP
150.0000 mg | Freq: Once | INTRAMUSCULAR | Status: AC
  Administered 2016-12-07: 150 mg via INTRAMUSCULAR

## 2016-12-07 NOTE — Telephone Encounter (Signed)
LMTCB

## 2016-12-07 NOTE — Telephone Encounter (Signed)
This is a chronic problems. Last OV her HR was better controlled. I made some recommendations a few visits ago,changed Metoprolol to to Cardizem because she could not take Metoprolol tab. Last OV she reported that she did not start new medications,instead she continued as recommended by her cardiologists,which is appropriate.   I recommend continuing following with cardiologists and follow as planned,sooner if needed.  Thanks, BJ

## 2016-12-07 NOTE — Telephone Encounter (Signed)
Spoke with pt and she would like to now try Cardizem, as she cannot get in to see Cardiology for several months.  Dr. Martinique - Please advise. Thanks!

## 2016-12-07 NOTE — Progress Notes (Signed)
Depo Provera 150mg  given IM left ventrogluteal without complications. Pt instructed to return in 12 weeks for next depo injection

## 2016-12-07 NOTE — Telephone Encounter (Signed)
I would prefer for her to continue following with cardiologists for this problem. [This is a chronic problem, one of her multiple complaints/problems].  I think it is important for just one provider to continue treating this problem, given her complex Hx I strongly recommend following her cardiologist.  She can call and arrange an earlier appt if HR is getting worse (objectively) or if a new symptom develops.  Thanks, BJ

## 2016-12-08 NOTE — Telephone Encounter (Signed)
Spoke with pt and gave recommendations. She will contact cards office to try and get sooner appt. Nothing further needed at this time.

## 2016-12-14 ENCOUNTER — Ambulatory Visit (INDEPENDENT_AMBULATORY_CARE_PROVIDER_SITE_OTHER): Payer: 59 | Admitting: Psychiatry

## 2016-12-14 ENCOUNTER — Encounter (HOSPITAL_COMMUNITY): Payer: Self-pay | Admitting: Psychiatry

## 2016-12-14 VITALS — BP 140/84 | Ht 66.0 in | Wt 190.0 lb

## 2016-12-14 DIAGNOSIS — F4310 Post-traumatic stress disorder, unspecified: Secondary | ICD-10-CM

## 2016-12-14 DIAGNOSIS — Z818 Family history of other mental and behavioral disorders: Secondary | ICD-10-CM

## 2016-12-14 DIAGNOSIS — Z811 Family history of alcohol abuse and dependence: Secondary | ICD-10-CM

## 2016-12-14 DIAGNOSIS — F411 Generalized anxiety disorder: Secondary | ICD-10-CM

## 2016-12-14 DIAGNOSIS — F332 Major depressive disorder, recurrent severe without psychotic features: Secondary | ICD-10-CM

## 2016-12-14 MED ORDER — ALPRAZOLAM 1 MG PO TABS
1.0000 mg | ORAL_TABLET | Freq: Four times a day (QID) | ORAL | 2 refills | Status: DC
Start: 2016-12-14 — End: 2017-03-12

## 2016-12-14 MED ORDER — AMITRIPTYLINE HCL 100 MG PO TABS: 100.0000 mg | ORAL_TABLET | Freq: Every day | ORAL | 2 refills | Status: DC
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
## 2016-12-18 ENCOUNTER — Ambulatory Visit: Payer: 59 | Admitting: Obstetrics & Gynecology

## 2016-12-18 ENCOUNTER — Other Ambulatory Visit: Payer: 59

## 2016-12-26 DIAGNOSIS — E041 Nontoxic single thyroid nodule: Secondary | ICD-10-CM | POA: Insufficient documentation

## 2016-12-27 ENCOUNTER — Telehealth: Payer: Self-pay | Admitting: Family Medicine

## 2016-12-27 ENCOUNTER — Telehealth (HOSPITAL_COMMUNITY): Payer: Self-pay | Admitting: *Deleted

## 2016-12-27 DIAGNOSIS — R000 Tachycardia, unspecified: Secondary | ICD-10-CM

## 2016-12-27 NOTE — Telephone Encounter (Signed)
That will be fine. 

## 2016-12-27 NOTE — Telephone Encounter (Signed)
Pt called stating Dr. Harrington Challenger informed her she can return for f/u in 2 months. Per pt her disability case worker just called her stating that she would have to see Dr. Harrington Challenger once a month for her to keep her disability. Per pt, she would like to know what to do. Per pt she would like know if Dr. Harrington Challenger could allow her to return in 1 month.

## 2016-12-27 NOTE — Telephone Encounter (Signed)
Made appt with pt

## 2016-12-27 NOTE — Telephone Encounter (Signed)
° °  Pt call to ask for referral to see a cardiologist and a vascular doctor. Said she was seeing some one in [facility]

## 2016-12-28 NOTE — Telephone Encounter (Signed)
Okay for referrals.

## 2016-12-28 NOTE — Telephone Encounter (Signed)
Cardiology referral placed

## 2016-12-28 NOTE — Telephone Encounter (Signed)
She can have a referral to a cardiologist, sine she was already following with one.  I do not see an indication for vascular surgery.  Thanks, BJ

## 2017-01-03 ENCOUNTER — Ambulatory Visit (HOSPITAL_COMMUNITY): Payer: 59 | Admitting: Psychiatry

## 2017-01-04 ENCOUNTER — Ambulatory Visit (INDEPENDENT_AMBULATORY_CARE_PROVIDER_SITE_OTHER): Payer: 59 | Admitting: Psychology

## 2017-01-04 ENCOUNTER — Encounter: Payer: Self-pay | Admitting: Psychology

## 2017-01-04 DIAGNOSIS — F0781 Postconcussional syndrome: Secondary | ICD-10-CM

## 2017-01-04 DIAGNOSIS — F419 Anxiety disorder, unspecified: Secondary | ICD-10-CM

## 2017-01-04 DIAGNOSIS — R413 Other amnesia: Secondary | ICD-10-CM | POA: Diagnosis not present

## 2017-01-04 DIAGNOSIS — F322 Major depressive disorder, single episode, severe without psychotic features: Secondary | ICD-10-CM
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
## 2017-01-09 ENCOUNTER — Encounter: Payer: Self-pay | Admitting: Physician Assistant
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
## 2017-01-10 ENCOUNTER — Ambulatory Visit: Payer: Self-pay | Admitting: Physician Assistant

## 2017-01-10 DIAGNOSIS — Z029 Encounter for administrative examinations, unspecified: Secondary | ICD-10-CM

## 2017-01-11 ENCOUNTER — Encounter: Payer: Self-pay | Admitting: Physician Assistant

## 2017-01-11 ENCOUNTER — Encounter: Payer: Self-pay | Admitting: Psychology

## 2017-01-12 ENCOUNTER — Encounter (HOSPITAL_COMMUNITY): Payer: Self-pay | Admitting: Psychiatry

## 2017-01-12 ENCOUNTER — Ambulatory Visit (INDEPENDENT_AMBULATORY_CARE_PROVIDER_SITE_OTHER): Payer: 59 | Admitting: Psychiatry

## 2017-01-12 VITALS — BP 114/88 | HR 98 | Ht 66.0 in | Wt 184.4 lb

## 2017-01-12 DIAGNOSIS — Z79899 Other long term (current) drug therapy: Secondary | ICD-10-CM

## 2017-01-12 DIAGNOSIS — Z811 Family history of alcohol abuse and dependence: Secondary | ICD-10-CM | POA: Diagnosis not present

## 2017-01-12 DIAGNOSIS — Z818 Family history of other mental and behavioral disorders: Secondary | ICD-10-CM

## 2017-01-12 DIAGNOSIS — F332 Major depressive disorder, recurrent severe without psychotic features: Secondary | ICD-10-CM | POA: Diagnosis not present

## 2017-01-12 DIAGNOSIS — F4310 Post-traumatic stress disorder, unspecified: Secondary | ICD-10-CM

## 2017-01-12 DIAGNOSIS — F411 Generalized anxiety disorder: Secondary | ICD-10-CM

## 2017-01-12 MED ORDER — FLUOXETINE HCL 20 MG PO CAPS: 20.0000 mg | ORAL_CAPSULE | Freq: Every day | ORAL | 2 refills | Status: DC

## 2017-01-12 MED ORDER — AMITRIPTYLINE HCL 100 MG PO TABS: 100.0000 mg | ORAL_TABLET | Freq: Every day | ORAL | 2 refills | Status: DC
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
## 2017-01-15 ENCOUNTER — Ambulatory Visit: Payer: 59 | Admitting: Family Medicine

## 2017-01-18 ENCOUNTER — Encounter: Payer: Self-pay | Admitting: Psychology

## 2017-01-19 LAB — HEPATIC FUNCTION PANEL
ALK PHOS: 104 (ref 25–125)
ALT: 19 (ref 7–35)
AST: 25 (ref 13–35)
BILIRUBIN, TOTAL: 0.2

## 2017-01-19 LAB — BASIC METABOLIC PANEL
BUN: 13 (ref 4–21)
Creatinine: 1 (ref 0.5–1.1)
Glucose: 88
Potassium: 4.4 (ref 3.4–5.3)
SODIUM: 141 (ref 137–147)

## 2017-01-19 LAB — CBC AND DIFFERENTIAL
HEMATOCRIT: 39 (ref 36–46)
HEMOGLOBIN: 13.2 (ref 12.0–16.0)
NEUTROS ABS: 8
PLATELETS: 354 (ref 150–399)
WBC: 12.7

## 2017-01-19 LAB — TSH: TSH: 3.4 (ref 0.41–5.90)

## 2017-01-25 ENCOUNTER — Ambulatory Visit: Payer: 59 | Admitting: Obstetrics & Gynecology

## 2017-01-25 ENCOUNTER — Other Ambulatory Visit: Payer: 59

## 2017-01-29 ENCOUNTER — Ambulatory Visit (INDEPENDENT_AMBULATORY_CARE_PROVIDER_SITE_OTHER): Payer: 59 | Admitting: Obstetrics & Gynecology

## 2017-01-29 ENCOUNTER — Telehealth (HOSPITAL_COMMUNITY): Payer: Self-pay | Admitting: *Deleted

## 2017-01-29 ENCOUNTER — Encounter: Payer: Self-pay | Admitting: Obstetrics & Gynecology

## 2017-01-29 ENCOUNTER — Ambulatory Visit (INDEPENDENT_AMBULATORY_CARE_PROVIDER_SITE_OTHER): Payer: 59

## 2017-01-29 VITALS — BP 106/70 | HR 74 | Wt 183.4 lb

## 2017-01-29 DIAGNOSIS — N83201 Unspecified ovarian cyst, right side: Secondary | ICD-10-CM

## 2017-01-29 NOTE — Progress Notes (Signed)
PELVIC US TA/TV: homogeneous anteverted uterus,wnl,normal ovaries bilat,EEC 2.3 mm,trace of simple fluid w/in the endometrium,no free fluid,ovaries appear mobile

## 2017-01-29 NOTE — Progress Notes (Signed)
Follow up appointment for results  Chief Complaint  Patient presents with  . Follow-up    ultrasound    Blood pressure 106/70, pulse 74, weight 183 lb 6.4 oz (83.2 kg).  US Transvaginal Non-ob  Result Date: 01/29/2017 GYNECOLOGIC SONOGRAM Destiny Orr is a 36 y.o. [id number] unknown LMP for a pelvic sonogram to f/u right ovarian cyst. Uterus                      9.2 x 2.9 x 5 cm, homogeneous anteverted uterus,wnl Endometrium          2.3 mm, symmetrical, a trace of simple fluid w/in the endometrium Right ovary             2.4 x 1.7 x 2.2 cm, wnl Left ovary                2.2 x 1.9 x 1.5 cm, wnl No free fluid Technician Comments: PELVIC US TA/TV: homogeneous anteverted uterus,wnl,normal ovaries bilat,EEC 2.3 mm,a trace of simple fluid w/in the endometrium,no free fluid,ovaries appear mobile [employer] 01/29/2017 11:20 AM Clinical Impression and recommendations: I have reviewed the sonogram results above, combined with the patient's current clinical course, below are my impressions and any appropriate recommendations for management based on the sonographic findings. Normal uterus and endometrium Both ovaries are normal with resolution of right ovarian cyst No pelvic sonogram EURE,LUTHER H 01/29/2017 11:52 AM   US Pelvis Complete  Result Date: 01/29/2017 GYNECOLOGIC SONOGRAM Destiny Orr is a 36 y.o. [id number] unknown LMP for a pelvic sonogram to f/u right ovarian cyst. Uterus                      9.2 x 2.9 x 5 cm, homogeneous anteverted uterus,wnl Endometrium          2.3 mm, symmetrical, a trace of simple fluid w/in the endometrium Right ovary             2.4 x 1.7 x 2.2 cm, wnl Left ovary                2.2 x 1.9 x 1.5 cm, wnl No free fluid Technician Comments: PELVIC US TA/TV: homogeneous anteverted uterus,wnl,normal ovaries bilat,EEC 2.3 mm,a trace of simple fluid w/in the endometrium,no free fluid,ovaries appear mobile [employer] 01/29/2017 11:20 AM Clinical Impression and recommendations: I have reviewed  the sonogram results above, combined with the patient's current clinical course, below are my impressions and any appropriate recommendations for management based on the sonographic findings. Normal uterus and endometrium Both ovaries are normal with resolution of right ovarian cyst No pelvic sonogram EURE,LUTHER H 01/29/2017 11:52 AM     MEDS ordered this encounter: No orders of the defined types were placed in this encounter.   Orders for this encounter: No orders of the defined types were placed in this encounter.   Impression: Right ovarian cyst resolved  Plan: Stop depo provera due to memory issues weight gain and some depression issues At least will take this variable out of the equation  Follow Up: Return in about 3 months (around 04/30/2017).       Face to face time:  10 minutes  Greater than 50% of the visit time was spent in counseling and coordination of care with the patient.  The summary and outline of the counseling and care coordination is summarized in the note above.   All questions were answered.  Past Medical History:  Diagnosis Date  .  Abnormal uterine bleeding (AUB) 12/09/2014  . Anxiety   . Arthritis   . Depression   . DJD (degenerative joint disease)   . Dumping syndrome   . Dysmenorrhea 07/15/2014  . Fatigue 12/24/2012  . Fibromyalgia diagnosed April 2016  . Headache   . Hx of migraine headaches 12/24/2012  . Inappropriate sinus tachycardia   . Irregular menstrual bleeding 07/15/2014  . Pelvic pain in female 03/03/2016  . Pneumothorax, spontaneous, tension   . Post concussion syndrome   . POTS (postural orthostatic tachycardia syndrome)   . PTSD (post-traumatic stress disorder)   . Scoliosis   . Thyroid disease   . Tick bite 03/03/2016  . Vitamin B 12 deficiency     Past Surgical History:  Procedure Laterality Date  . bone spurs toes Right   . CESAREAN SECTION    . COLONOSCOPY    . endooscopy    . KNEE SURGERY    . rotate cuff lt arm       OB History    Gravida Para Term Preterm AB Living   3 2 2   1 3    SAB TAB Ectopic Multiple Live Births         1 3      Allergies  Allergen Reactions  . Topiramate Other (See Comments)    Topamax-dizziness  . Lexapro [Escitalopram Oxalate] Other (See Comments)    Flat affect, No emotions    Social History   Social History  . Marital status: Legally Separated    Spouse name: N/A  . Number of children: N/A  . Years of education: N/A   Social History Main Topics  . Smoking status: Current Every Day Smoker    Packs/day: 1.00    Years: 15.00    Types: Cigarettes    Start date: 03/13/1997  . Smokeless tobacco: Never Used     Comment: "working on it" Was smoking 2.5 packs a day  . Alcohol use No  . Drug use: No  . Sexual activity: Not Currently    Partners: Male    Birth control/ protection: Injection   Other Topics Concern  . None   Social History Narrative  . None    Family History  Problem Relation Age of Onset  . Hypertension Father   . Atrial fibrillation Father   . Alcohol abuse Father   . Cancer Maternal Grandmother     skin   . Heart disease Maternal Grandmother   . Other Maternal Grandmother     had thyroid removed  . Breast cancer Maternal Grandmother   . Heart disease Paternal Grandfather   . COPD Paternal Grandfather   . Hypertension Paternal Grandfather   . Diabetes Paternal Grandfather   . Stroke Paternal Grandfather   . Anxiety disorder Mother   . Cancer Maternal Grandfather     bladder,lung  . Anxiety disorder Maternal Aunt   . Anxiety disorder Maternal Uncle   . Bipolar disorder Maternal Uncle   . Breast cancer Maternal Uncle     CML  . Leukemia Other   . Colon cancer Other     lung-2 maternal great uncles

## 2017-01-29 NOTE — Telephone Encounter (Signed)
Spoke with patient and appt was made for 01-31-2017.

## 2017-01-29 NOTE — Telephone Encounter (Signed)
There are plenty of other antidepressants but she would need to come in to discuss
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
## 2017-01-30 ENCOUNTER — Ambulatory Visit: Payer: 59 | Admitting: Family Medicine

## 2017-01-30 ENCOUNTER — Ambulatory Visit (INDEPENDENT_AMBULATORY_CARE_PROVIDER_SITE_OTHER): Payer: 59 | Admitting: Neurology

## 2017-01-30 ENCOUNTER — Encounter: Payer: Self-pay | Admitting: Neurology

## 2017-01-30 VITALS — BP 134/82 | HR 89 | Temp 98.3°F | Ht 65.5 in | Wt 182.0 lb

## 2017-01-30 DIAGNOSIS — R413 Other amnesia: Secondary | ICD-10-CM

## 2017-01-30 DIAGNOSIS — R253 Fasciculation: Secondary | ICD-10-CM | POA: Diagnosis not present

## 2017-01-30 DIAGNOSIS — F322 Major depressive disorder, single episode, severe without psychotic features: Secondary | ICD-10-CM | POA: Diagnosis not present

## 2017-01-30 DIAGNOSIS — F0781 Postconcussional syndrome: Secondary | ICD-10-CM | POA: Diagnosis not present

## 2017-01-30 MED ORDER — AMITRIPTYLINE HCL 100 MG PO TABS: 100.0000 mg | ORAL_TABLET | Freq: Every day | ORAL | 6 refills | Status: DC

## 2017-01-30 NOTE — Patient Instructions (Signed)
1. Restart amitriptyline 100mg : take 1/2 tablet at night for 1 week, then increase to 1 tablet at night 2. Schedule routine EEG 3. Proceed with Neuropsychological testing for memory loss 4. Continue with psychiatry and psychotherapy 5. Follow-up in 4 months, call for any changes
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
## 2017-01-31 ENCOUNTER — Ambulatory Visit (INDEPENDENT_AMBULATORY_CARE_PROVIDER_SITE_OTHER): Payer: 59 | Admitting: Psychology

## 2017-01-31 ENCOUNTER — Ambulatory Visit (HOSPITAL_COMMUNITY): Payer: Self-pay | Admitting: Psychiatry

## 2017-01-31 DIAGNOSIS — R413 Other amnesia: Secondary | ICD-10-CM | POA: Diagnosis not present

## 2017-01-31 NOTE — Progress Notes (Signed)
   Neuropsychology Note  Christina Toya returned today for 2 hours of neuropsychological testing with technician, Milana Kidney, BS, under the supervision of Dr. Macarthur Critchley. The patient did not appear overtly distressed by the testing session, per behavioral observation or via self-report to the technician. Rest breaks were offered. Neta Bohnsack will return within 2 weeks for a feedback session with Dr. Si Raider at which time her test performances, clinical impressions and treatment recommendations will be reviewed in detail. The patient understands she can contact our office should she require our assistance before this time.  Full report to follow.

## 2017-02-01 ENCOUNTER — Ambulatory Visit (INDEPENDENT_AMBULATORY_CARE_PROVIDER_SITE_OTHER): Payer: 59 | Admitting: Neurology

## 2017-02-01 ENCOUNTER — Ambulatory Visit (HOSPITAL_COMMUNITY): Payer: Self-pay | Admitting: Psychiatry

## 2017-02-01 DIAGNOSIS — R253 Fasciculation: Secondary | ICD-10-CM

## 2017-02-06 ENCOUNTER — Telehealth (HOSPITAL_COMMUNITY): Payer: Self-pay | Admitting: *Deleted

## 2017-02-06 NOTE — Telephone Encounter (Signed)
returned phone call to patient, no answer, left voice message. 

## 2017-02-08 ENCOUNTER — Telehealth: Payer: Self-pay | Admitting: Neurology

## 2017-02-08 NOTE — Telephone Encounter (Signed)
Destiny Orr with Destiny Orr called regarding following up with a request for office notes on 01/30/2017 for PT regarding a disability claim/Dawn CB# [phone] [id number] Fax# [phone]

## 2017-02-09 NOTE — Telephone Encounter (Signed)
PT called and wanted to know if the EEG results were available yet
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
## 2017-02-12 ENCOUNTER — Telehealth: Payer: Self-pay

## 2017-02-12 ENCOUNTER — Encounter: Payer: Self-pay | Admitting: Psychology

## 2017-02-12 NOTE — Telephone Encounter (Signed)
-----   Message from Cameron Sprang, MD sent at 02/09/2017  1:08 PM EDT ----- Pls let her know the brain wave test was normal, no seizure discharges seen. Thanks

## 2017-02-12 NOTE — Telephone Encounter (Signed)
LMOM relaying message below.  

## 2017-02-12 NOTE — Telephone Encounter (Signed)
Santiago Glad from [employer]  Was calling back to check the status on the fax that was sent on 02-02-17 about the work status please call her at [phone] ext [phone] fax [phone]

## 2017-02-13 ENCOUNTER — Encounter (HOSPITAL_COMMUNITY): Payer: Self-pay | Admitting: Psychiatry

## 2017-02-13 ENCOUNTER — Ambulatory Visit: Payer: 59 | Admitting: Family Medicine

## 2017-02-13 ENCOUNTER — Ambulatory Visit (INDEPENDENT_AMBULATORY_CARE_PROVIDER_SITE_OTHER): Payer: 59 | Admitting: Psychiatry

## 2017-02-13 VITALS — BP 109/81 | HR 101 | Ht 65.5 in | Wt 184.4 lb

## 2017-02-13 DIAGNOSIS — Z79899 Other long term (current) drug therapy: Secondary | ICD-10-CM

## 2017-02-13 DIAGNOSIS — Z818 Family history of other mental and behavioral disorders: Secondary | ICD-10-CM

## 2017-02-13 DIAGNOSIS — F411 Generalized anxiety disorder: Secondary | ICD-10-CM | POA: Diagnosis not present

## 2017-02-13 DIAGNOSIS — F4310 Post-traumatic stress disorder, unspecified: Secondary | ICD-10-CM | POA: Diagnosis not present

## 2017-02-13 DIAGNOSIS — F332 Major depressive disorder, recurrent severe without psychotic features: Secondary | ICD-10-CM

## 2017-02-13 DIAGNOSIS — Z0289 Encounter for other administrative examinations: Secondary | ICD-10-CM
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
## 2017-02-14 ENCOUNTER — Encounter (HOSPITAL_COMMUNITY): Payer: Self-pay | Admitting: Psychiatry

## 2017-02-14 ENCOUNTER — Ambulatory Visit (INDEPENDENT_AMBULATORY_CARE_PROVIDER_SITE_OTHER): Payer: 59 | Admitting: Psychiatry

## 2017-02-14 DIAGNOSIS — F332 Major depressive disorder, recurrent severe without psychotic features: Secondary | ICD-10-CM | POA: Diagnosis not present
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
## 2017-02-19 ENCOUNTER — Encounter: Payer: Self-pay | Admitting: Psychology

## 2017-02-19 ENCOUNTER — Ambulatory Visit (INDEPENDENT_AMBULATORY_CARE_PROVIDER_SITE_OTHER): Payer: 59 | Admitting: Psychology

## 2017-02-19 DIAGNOSIS — R413 Other amnesia: Secondary | ICD-10-CM

## 2017-02-19 DIAGNOSIS — S060X0S Concussion without loss of consciousness, sequela: Secondary | ICD-10-CM

## 2017-02-19 DIAGNOSIS — F0781 Postconcussional syndrome: Secondary | ICD-10-CM

## 2017-02-19 NOTE — Patient Instructions (Signed)
Fortunately, results of cognitive testing were entirely within normal limits and NOT indicative of a neurocognitive disorder.  Psychological testing demonstrated significant depression.  Depression can affect cognitive functioning (more information below). I am also giving you information on post-concussion syndrome and strategies to cope with PCS symptoms.  The effect of depression and anxiety on your cognitive functioning: . One of the typical symptoms of depression is difficulty concentrating and making decisions, and various types of anxiety also interfere with attention and concentration . Problems with attention and concentration can disrupt the process of learning and making new memories, which can make it seem like there is a problem with your memory. In your daily life, you may experience this disruption as forgetting names and appointments, misplacing items, and needing to make lists for shopping and errands. It may be harder for you to stay focused on tasks and feel as "sharp" as you did in the past.  . Also, when we are depressed or anxious, we often pay more attention to our difficulties (rather than our strengths) in our daily life, and this can make it seem to [place] like we are doing worse cognitively than we really are. . The cognitive aspects of depression and anxiety are sometimes observed as an identifiable pattern of poor performance on a neuropsychological evaluation, but it is also possible that all scores on an evaluation are within normal limits. . Regardless of the test scores, distress related to depression and anxiety can interfere with the ability to make use of your cognitive resources and function optimally across settings such as work or school, maintaining the home and responsibilities, and personal relationships. . Fortunately, there are treatments for depression and anxiety, and when mood improves, cognitive functioning in daily life often improves. . Treatment options  include psychotherapy, medications (e.g., antidepressants), and behavioral changes, such as increasing your involvement in enjoyable activities, increasing the amount of exercise you are getting, and maintaining a regular routine.

## 2017-02-20 NOTE — Progress Notes (Deleted)
Cardiology Office Note Date:  02/20/2017  Patient ID:  Destiny Orr, Destiny Orr 26-Mar-1981, MRN [id number] PCP:  [place], Betty G, MD  Cardiologist:  Dr. Lovena Le  ***refresh   Chief Complaint: ***  History of Present Illness: Destiny Orr is a 36 y.o. female with history of POTS, depression/anxiety, PTSD, ADHD, comes in today to be seen for Dr. Lovena Le.  She was last seen actually by Dr. Harl Bowie in May 2017 with c/o ongoing palpitations and orthostatic symptoms, was recommended to try compression stockings with thoughts of starting florinef if no improvement though to continue f/u with EP/Dr. Tanna Furry service.  She was seen yesterday by neuropsyc service dx with major depressive disorder, severe post concussion syndrome, provided information on coping strategies, no new medicines.  *** ? Cardiology service at [facility] *** symptoms *** meds *** orthostatics *** no longer on BB??   Past Medical History:  Diagnosis Date  . Abnormal uterine bleeding (AUB) 12/09/2014  . Anxiety   . Arthritis   . Depression   . DJD (degenerative joint disease)   . Dumping syndrome   . Dysmenorrhea 07/15/2014  . Fatigue 12/24/2012  . Fibromyalgia diagnosed April 2016  . Headache   . Hx of migraine headaches 12/24/2012  . Inappropriate sinus tachycardia   . Irregular menstrual bleeding 07/15/2014  . Pelvic pain in female 03/03/2016  . Pneumothorax, spontaneous, tension   . Post concussion syndrome   . POTS (postural orthostatic tachycardia syndrome)   . PTSD (post-traumatic stress disorder)   . Scoliosis   . Thyroid disease   . Tick bite 03/03/2016  . Vitamin B 12 deficiency     Past Surgical History:  Procedure Laterality Date  . bone spurs toes Right   . CESAREAN SECTION    . COLONOSCOPY    . endooscopy    . KNEE SURGERY    . rotate cuff lt arm      Current Outpatient Prescriptions  Medication Sig Dispense Refill  . acetaminophen (TYLENOL) 80 MG chewable tablet Chew 80 mg by mouth every 6 (six) hours as  needed.    . ALPRAZolam (XANAX) 1 MG tablet Take 1 tablet (1 mg total) by mouth 4 (four) times daily. 120 tablet 2  . amitriptyline (ELAVIL) 100 MG tablet Take 1 tablet (100 mg total) by mouth at bedtime. 30 tablet 6  . cyanocobalamin (,VITAMIN B-12,) 1000 MCG/ML injection TAKE TO OFFICE FOR INJECTION. (Patient taking differently: TAKE TO OFFICE FOR INJECTION MONTHLY) 1 mL 52  . meloxicam (MOBIC) 15 MG tablet Take 15 mg by mouth daily.    . metoprolol succinate (TOPROL-XL) 50 MG 24 hr tablet Take 50 mg by mouth daily.      No current facility-administered medications for this visit.     Allergies:   Prozac [fluoxetine]; Topiramate; and Lexapro [escitalopram oxalate]   Social History:  The patient  reports that she has been smoking Cigarettes.  She started smoking about 19 years ago. She has a 15.00 pack-year smoking history. She has never used smokeless tobacco. She reports that she does not drink alcohol or use drugs.   Family History:  The patient's family history includes Alcohol abuse in her father; Anxiety disorder in her maternal aunt, maternal uncle, and mother; Atrial fibrillation in her father; Bipolar disorder in her maternal uncle; Breast cancer in her maternal grandmother and maternal uncle; COPD in her paternal grandfather; Cancer in her maternal grandfather and maternal grandmother; Colon cancer in her other; Diabetes in her paternal grandfather; Heart disease in  her maternal grandmother and paternal grandfather; Hypertension in her father and paternal grandfather; Leukemia in her other; Other in her maternal grandmother; Stroke in her paternal grandfather.  ROS:  Please see the history of present illness.  All other systems are reviewed and otherwise negative.   PHYSICAL EXAM: *** VS:  There were no vitals taken for this visit. BMI: There is no height or weight on file to calculate BMI. Well nourished, well developed, in no acute distress  HEENT: normocephalic, atraumatic  Neck:  no JVD, carotid bruits or masses Cardiac:  *** RRR; no significant murmurs, no rubs, or gallops Lungs:  *** CTA b/l, no wheezing, rhonchi or rales  Abd: soft, nontender MS: no deformity or atrophy Ext: *** no edema  Skin: warm and dry, no rash Neuro:  No gross deficits appreciated Psych: *** euthymic mood, full affect   EKG:  Done today shows ***  12/2015 echo Study Conclusions  - Left ventricle: The cavity size was normal. Wall thickness was  normal. Systolic function was normal. The estimated ejection  fraction was in the range of 60% to 65%. Wall motion was normal;  there were no regional wall motion abnormalities. Left  ventricular diastolic function parameters were normal.  12/2015 Holter monitor 48 hour Holter monitor reviewed. Sinus rhythm and sinus tachycardia noted. Heart rate ranged from 56 bpm up to 143 beats bpm with average heart rate 90 bpm. No sustained arrhythmias. Rare PACs and PVCs/fusion beat. No pauses.     Recent Labs: No results found for requested labs within last 8760 hours.  No results found for requested labs within last 8760 hours.   CrCl cannot be calculated (Patient's most recent lab result is older than the maximum 21 days allowed.).   Wt Readings from Last 3 Encounters:  01/30/17 182 lb (82.6 kg)  01/29/17 183 lb 6.4 oz (83.2 kg)  11/23/16 184 lb 6 oz (83.6 kg)     Other studies reviewed: Additional studies/records reviewed today include: summarized above  ASSESSMENT AND PLAN:  1. Autonaumic dysfunction, likely POTS by Dr. Tanna Furry notes     ***  Disposition: F/u with ***  Current medicines are reviewed at length with the patient today.  The patient did not have any concerns regarding medicines.***  Signed, Jennings Books, PA-C 02/20/2017 5:02 PM     [address] [place] [address] [phone] (office)  [phone] (fax)

## 2017-02-21 ENCOUNTER — Ambulatory Visit: Payer: 59 | Admitting: Physician Assistant

## 2017-02-21 ENCOUNTER — Ambulatory Visit: Payer: Self-pay | Admitting: Physician Assistant

## 2017-02-22 ENCOUNTER — Encounter: Payer: Self-pay | Admitting: Physician Assistant

## 2017-02-27 ENCOUNTER — Telehealth (HOSPITAL_COMMUNITY): Payer: Self-pay | Admitting: *Deleted

## 2017-02-27 ENCOUNTER — Ambulatory Visit (HOSPITAL_COMMUNITY): Payer: Self-pay | Admitting: Psychiatry

## 2017-02-27 NOTE — Telephone Encounter (Signed)
left voice message, provider out of office 02/27/17.

## 2017-02-28 ENCOUNTER — Telehealth: Payer: Self-pay | Admitting: Neurology

## 2017-02-28 NOTE — Telephone Encounter (Signed)
Patient wants to talk to someone about the result of the EEG

## 2017-03-01 ENCOUNTER — Ambulatory Visit: Payer: 59

## 2017-03-01 NOTE — Telephone Encounter (Signed)
Gave pt resent EEG results.

## 2017-03-03 ENCOUNTER — Other Ambulatory Visit: Payer: Self-pay | Admitting: Adult Health

## 2017-03-05 ENCOUNTER — Encounter: Payer: Self-pay | Admitting: *Deleted

## 2017-03-05 ENCOUNTER — Ambulatory Visit (INDEPENDENT_AMBULATORY_CARE_PROVIDER_SITE_OTHER): Payer: 59 | Admitting: *Deleted

## 2017-03-05 ENCOUNTER — Telehealth: Payer: Self-pay | Admitting: Obstetrics & Gynecology

## 2017-03-05 DIAGNOSIS — Z308 Encounter for other contraceptive management: Secondary | ICD-10-CM

## 2017-03-05 DIAGNOSIS — Z3202 Encounter for pregnancy test, result negative: Secondary | ICD-10-CM

## 2017-03-05 DIAGNOSIS — Z3042 Encounter for surveillance of injectable contraceptive: Secondary | ICD-10-CM

## 2017-03-05 LAB — POCT URINE PREGNANCY: PREG TEST UR: NEGATIVE

## 2017-03-05 MED ORDER — MEDROXYPROGESTERONE ACETATE 150 MG/ML IM SUSP
150.0000 mg | Freq: Once | INTRAMUSCULAR | Status: AC
  Administered 2017-03-05: 150 mg via INTRAMUSCULAR

## 2017-03-05 NOTE — Telephone Encounter (Signed)
Pt called stating that she would like to back on the Depo, But Dr.Eure has stopped her from getting because per Pt she has been memory and weight gain. Pt would like to know if she could still get the depo. Please contact pt

## 2017-03-05 NOTE — Progress Notes (Signed)
Pt here for Depo. Pt tolerated shot well. Return in 12 weeks for next shot. [name] 

## 2017-03-05 NOTE — Telephone Encounter (Signed)
Patient states she wants to be put on schedule for depo injection today since [facility] refilled it.  Will put on schedule.

## 2017-03-07 ENCOUNTER — Telehealth: Payer: Self-pay | Admitting: Family Medicine

## 2017-03-07 NOTE — Telephone Encounter (Signed)
Pt has been scheduled and will also bring in lab work.

## 2017-03-07 NOTE — Telephone Encounter (Signed)
Can we get her set up for an appointment? She needs to bring the lab work as well.

## 2017-03-07 NOTE — Telephone Encounter (Signed)
If Dr Sherilyn Dacosta recommended hematology evaluation I assume appt arrangements are in process. Otherwise I need copy of labs and she needs to arrange a f/u appt to discuss results.  Thanks, BJ

## 2017-03-07 NOTE — Telephone Encounter (Signed)
Pt would like to be referred to a Hematologist due to her having a high white blood count per Dr. Sherilyn Dacosta lab work was done twice and it came back both times high.

## 2017-03-12 ENCOUNTER — Ambulatory Visit (INDEPENDENT_AMBULATORY_CARE_PROVIDER_SITE_OTHER): Payer: 59 | Admitting: Psychiatry

## 2017-03-12 ENCOUNTER — Encounter (HOSPITAL_COMMUNITY): Payer: Self-pay | Admitting: Psychiatry

## 2017-03-12 VITALS — BP 118/90 | HR 81 | Ht 65.5 in | Wt 183.8 lb

## 2017-03-12 DIAGNOSIS — F411 Generalized anxiety disorder: Secondary | ICD-10-CM

## 2017-03-12 DIAGNOSIS — Z79899 Other long term (current) drug therapy: Secondary | ICD-10-CM

## 2017-03-12 DIAGNOSIS — Z811 Family history of alcohol abuse and dependence: Secondary | ICD-10-CM

## 2017-03-12 DIAGNOSIS — Z818 Family history of other mental and behavioral disorders: Secondary | ICD-10-CM

## 2017-03-12 DIAGNOSIS — Z888 Allergy status to other drugs, medicaments and biological substances status: Secondary | ICD-10-CM

## 2017-03-12 DIAGNOSIS — F17210 Nicotine dependence, cigarettes, uncomplicated: Secondary | ICD-10-CM

## 2017-03-12 DIAGNOSIS — F332 Major depressive disorder, recurrent severe without psychotic features: Secondary | ICD-10-CM

## 2017-03-12 DIAGNOSIS — F4310 Post-traumatic stress disorder, unspecified: Secondary | ICD-10-CM

## 2017-03-12 DIAGNOSIS — Z793 Long term (current) use of hormonal contraceptives: Secondary | ICD-10-CM

## 2017-03-12 MED ORDER — VENLAFAXINE HCL ER 37.5 MG PO CP24: ORAL_CAPSULE | ORAL | 2 refills | Status: DC

## 2017-03-12 MED ORDER — ALPRAZOLAM 1 MG PO TABS: 1.0000 mg | ORAL_TABLET | Freq: Four times a day (QID) | ORAL | 2 refills | Status: DC
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
## 2017-03-14 ENCOUNTER — Ambulatory Visit (HOSPITAL_COMMUNITY): Payer: Self-pay | Admitting: Psychiatry

## 2017-03-19 ENCOUNTER — Ambulatory Visit (INDEPENDENT_AMBULATORY_CARE_PROVIDER_SITE_OTHER): Payer: 59 | Admitting: Family Medicine

## 2017-03-19 ENCOUNTER — Encounter: Payer: Self-pay | Admitting: Family Medicine

## 2017-03-19 VITALS — BP 122/80 | HR 90 | Resp 12 | Ht 65.5 in | Wt 183.2 lb

## 2017-03-19 DIAGNOSIS — R5382 Chronic fatigue, unspecified: Secondary | ICD-10-CM

## 2017-03-19 DIAGNOSIS — R5381 Other malaise: Secondary | ICD-10-CM

## 2017-03-19 DIAGNOSIS — D72829 Elevated white blood cell count, unspecified: Secondary | ICD-10-CM | POA: Diagnosis not present

## 2017-03-19 NOTE — Progress Notes (Signed)
HPI:  ACUTE VISIT:  Chief Complaint  Patient presents with  . Follow-up    Destiny Orr is a 36 y.o. female, who is here today requesting referral to hematologists.  According to patient, for the past few months she has had elevated leukocytes. She reports having CBC done in April 2018 and abnormality was still present. She tells me that Dr Altheimer has recommended hematology referral.   Value Ref Range  WBC 14.6 (H) 4.8 - 10.8 x 10*3/uL  RBC 4.54 4.20 - 5.40 x 10*6/uL  Hemoglobin 13.9 12.0 - 16.0 G/DL  Hematocrit 41.0 37.0 - 47.0 %  MCV 90.3 81.0 - 99.0 FL  MCH 30.6 27.0 - 31.0 PG  MCHC 33.9 33.0 - 37.0 G/DL  RDW 13.7 11.5 - 14.5 %  Platelets 378 (H) 160 - 360 X 10*3/uL  MPV 8.2 6.8 - 10.2 FL   CBC (12/26/2016 4:50 PM)  Specimen Performing Laboratory  [facility]  CLIA# [id number]  [place], [place] [address]      I do not have a copy of CBC done in April 2018 but she states that [facility] were still elevated as well as platelets. She denies steroid treatment in the past few months or acute illness at the time labs were done.    CBC Latest Ref Rng & Units 12/22/2015 04/12/2014 01/07/2013  WBC 3.4 - 10.8 x10E3/uL 8.9 10.5 10.2  Hemoglobin 11.1 - 15.9 g/dL 14.9 13.5 14.9  Hematocrit 34.0 - 46.6 % 43.1 38.3 43.9  Platelets 150 - 379 x10E3/uL 340 270 287    She is very concerned about a serious process because her maternal uncle was diagnosed with some type of leukemia. She wonders if her chronic fatigue and aches are related to a serious hematologic process. She also has noted easy bruising and night sweats. She denies fever, abnormal weight loss, frequent nose/completing, blood in the stool, gross hematuria, or skin rash.  + Smoker. She is working on decreasing the amount of cigarettes per day.  She is currently following with neurologist,Dr Delice Lesch, due to post concussion synd. She recently had  neuropsychiatric evaluation, 01/31/17.  Other  This is a new problem. The current episode started more than 1 month ago. The problem has been unchanged. Associated symptoms include arthralgias, diaphoresis, fatigue, headaches, myalgias, nausea and numbness. Pertinent negatives include no abdominal pain, change in bowel habit, chest pain, chills, coughing, fever, rash, sore throat, swollen glands, urinary symptoms, vomiting or weakness. She has tried nothing for the symptoms.   She has history of fibromyalgia, headaches, intermittent numbness,sleep disorder, anxiety, depression, and pelvic pain. All these are chronic and overall stable   Review of Systems  Constitutional: Positive for diaphoresis and fatigue. Negative for activity change, appetite change, chills, fever and unexpected weight change.  HENT: Negative for mouth sores, nosebleeds and sore throat.   Eyes: Negative for pain and visual disturbance.  Respiratory: Negative for cough, shortness of breath and wheezing.   Cardiovascular: Positive for palpitations (stable). Negative for chest pain and leg swelling.  Gastrointestinal: Positive for nausea. Negative for abdominal pain, blood in stool, change in bowel habit and vomiting.       No changes in bowel habits.  Endocrine: Negative for cold intolerance, heat intolerance, polydipsia, polyphagia and polyuria.  Genitourinary: Negative for decreased urine volume, dysuria and hematuria.  Musculoskeletal: Positive for arthralgias and myalgias.  Skin: Negative for pallor and rash.  Allergic/Immunologic: Positive for environmental allergies.  Neurological: Positive for numbness and headaches. Negative for syncope, facial asymmetry and weakness.  Hematological: Negative for adenopathy. Bruises/bleeds easily.  Psychiatric/Behavioral: Positive for behavioral problems. Negative for hallucinations. The patient is nervous/anxious.       Current Outpatient Prescriptions on File Prior to Visit    Medication Sig Dispense Refill  . acetaminophen (TYLENOL) 80 MG chewable tablet Chew 80 mg by mouth every 6 (six) hours as needed.    . ALPRAZolam (XANAX) 1 MG tablet Take 1 tablet (1 mg total) by mouth 4 (four) times daily. 120 tablet 2  . medroxyPROGESTERone (DEPO-PROVERA) 150 MG/ML injection INJECT 1 VIAL INTRAMUSCULARLY EVERY 3 MONTHS IN OFFICE. 1 mL 3  . metoprolol succinate (TOPROL-XL) 50 MG 24 hr tablet Take 50 mg by mouth daily.     Marland Kitchen venlafaxine XR (EFFEXOR XR) 37.5 MG 24 hr capsule Take one every morning for two weeks, then increase to 2 every morning 60 capsule 2   No current facility-administered medications on file prior to visit.      Past Medical History:  Diagnosis Date  . Abnormal uterine bleeding (AUB) 12/09/2014  . Anxiety   . Arthritis   . Depression   . DJD (degenerative joint disease)   . Dumping syndrome   . Dysmenorrhea 07/15/2014  . Fatigue 12/24/2012  . Fibromyalgia diagnosed April 2016  . Headache   . Hx of migraine headaches 12/24/2012  . Inappropriate sinus tachycardia   . Irregular menstrual bleeding 07/15/2014  . Pelvic pain in female 03/03/2016  . Pneumothorax, spontaneous, tension   . Post concussion syndrome   . POTS (postural orthostatic tachycardia syndrome)   . PTSD (post-traumatic stress disorder)   . Scoliosis   . Thyroid disease   . Tick bite 03/03/2016  . Vitamin B 12 deficiency    Allergies  Allergen Reactions  . Prozac [Fluoxetine]     Suicidal thoughts  . Topiramate Other (See Comments)    Topamax-dizziness  . Lexapro [Escitalopram Oxalate] Other (See Comments)    Flat affect, No emotions    Social History   Social History  . Marital status: Legally Separated    Spouse name: N/A  . Number of children: N/A  . Years of education: N/A   Social History Main Topics  . Smoking status: Current Every Day Smoker    Packs/day: 1.00    Years: 15.00    Types: Cigarettes    Start date: 03/13/1997  . Smokeless tobacco: Never Used      Comment: "working on it" Was smoking 2.5 packs a day  . Alcohol use No  . Drug use: No  . Sexual activity: Not Currently    Partners: Male    Birth control/ protection: Injection   Other Topics Concern  . None   Social History Narrative  . None    Vitals:   03/19/17 1137  BP: 122/80  Pulse: 90  Resp: 12  O2 sat at RA 97% Body mass index is 30.03 kg/m.   Physical Exam  Nursing note and vitals reviewed. Constitutional: She is oriented to person, place, and time. She appears well-developed. No distress.  HENT:  Head: Atraumatic.  Mouth/Throat: Oropharynx is clear and moist and mucous membranes are normal.  Eyes: Conjunctivae and EOM are normal. Pupils are equal, round, and reactive to light.  Neck: No tracheal deviation present. Thyromegaly present.  Cardiovascular: Normal rate and regular rhythm.   No murmur heard. Respiratory: Effort normal and breath sounds normal. No respiratory distress.  GI: Soft. She  exhibits no mass. There is no hepatosplenomegaly. There is no tenderness.  Musculoskeletal: She exhibits no edema.  Lymphadenopathy:    She has no cervical adenopathy.       Right: No supraclavicular adenopathy present.       Left: No supraclavicular adenopathy present.  Neurological: She is alert and oriented to person, place, and time. She has normal strength. Coordination and gait normal.  Skin: Skin is warm. No ecchymosis, no petechiae and no rash noted. No erythema.  Psychiatric: Her mood appears anxious.  Poor groomed, good eye contact.    ASSESSMENT AND PLAN:   Georgeanne was seen today for follow-up.  Diagnoses and all orders for this visit:  Leukocytosis, unspecified type  We discuss possible etiologies, reviewed CBC done in March 2018, mild leukocytosis and thrombocytosis with normal H/H and RBC's.   I do not have last CBC, 12/2016. She has had several CBC's done in the past 1-2 years and most otherwise normal. Appointment with hematology will be arranged.    -     Ambulatory referral to Hematology  Chronic fatigue and malaise  Per review of possible etiologies, a few of her chronic medical problems came contribute to this problem. He seems to be stable overall.     15 min face to face OV. > 50% was dedicated to discussion of differential Dx and prognosis as well as coordination of care. Due to anxiety, extra time was invested in reassuring.  Explained that symptoms like fatigue among some , she has had for several months, so I'm not certain this is related.  Encouraged to continue working on smoking cessation, adverse effects discussed.  Currently she is following with different providers: GI, neurologist, endocrinologists,cardiologist, orthopedist, gynecologist, psychiatrist, and psychologist among some. So I will continue seeing her annually and as needed.    Betty G. Martinique, MD  [facility] [place] office.

## 2017-03-19 NOTE — Patient Instructions (Signed)
A few things to remember from today's visit:   Leukocytosis, unspecified type - Plan: Ambulatory referral to Hematology  Referral placed.  Please be sure medication list is accurate. If a new problem present, please set up appointment sooner than planned today.

## 2017-03-21 ENCOUNTER — Encounter: Payer: Self-pay | Admitting: Family Medicine

## 2017-03-28 ENCOUNTER — Ambulatory Visit (HOSPITAL_COMMUNITY): Payer: Self-pay | Admitting: Psychiatry

## 2017-04-10 ENCOUNTER — Ambulatory Visit (INDEPENDENT_AMBULATORY_CARE_PROVIDER_SITE_OTHER): Payer: 59 | Admitting: Psychiatry

## 2017-04-10 ENCOUNTER — Encounter (HOSPITAL_COMMUNITY): Payer: Self-pay | Admitting: Psychiatry

## 2017-04-10 VITALS — BP 143/98 | HR 127 | Ht 65.5 in | Wt 179.6 lb

## 2017-04-10 DIAGNOSIS — Z818 Family history of other mental and behavioral disorders: Secondary | ICD-10-CM | POA: Diagnosis not present

## 2017-04-10 DIAGNOSIS — Z811 Family history of alcohol abuse and dependence: Secondary | ICD-10-CM

## 2017-04-10 DIAGNOSIS — F4310 Post-traumatic stress disorder, unspecified: Secondary | ICD-10-CM

## 2017-04-10 DIAGNOSIS — F332 Major depressive disorder, recurrent severe without psychotic features: Secondary | ICD-10-CM

## 2017-04-10 DIAGNOSIS — F411 Generalized anxiety disorder: Secondary | ICD-10-CM

## 2017-04-10 MED ORDER — DULOXETINE HCL 30 MG PO CPEP: 30.0000 mg | ORAL_CAPSULE | Freq: Two times a day (BID) | ORAL | 2 refills | Status: DC
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
## 2017-04-12 ENCOUNTER — Ambulatory Visit (HOSPITAL_COMMUNITY): Payer: Self-pay | Admitting: Psychiatry

## 2017-04-16 ENCOUNTER — Ambulatory Visit: Payer: Self-pay | Admitting: Oncology

## 2017-04-16 ENCOUNTER — Telehealth: Payer: Self-pay | Admitting: Neurology
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
## 2017-04-16 NOTE — Telephone Encounter (Signed)
Please advise 

## 2017-04-17 ENCOUNTER — Telehealth: Payer: Self-pay | Admitting: Neurology

## 2017-04-17 DIAGNOSIS — R519 Headache, unspecified: Secondary | ICD-10-CM

## 2017-04-17 DIAGNOSIS — R51 Headache: Principal | ICD-10-CM

## 2017-04-17 NOTE — Telephone Encounter (Signed)
Patient called back with a question for you. Please call. Thanks

## 2017-04-17 NOTE — Telephone Encounter (Signed)
LMOM relaying detailed message below

## 2017-04-17 NOTE — Telephone Encounter (Signed)
Pls let her know the Toprol XL 50mg  is a medicine that can help reduce the frequency and intensity of headaches, she is on a low dose, increase to 2 tablets daily. Thanks

## 2017-04-18 NOTE — Telephone Encounter (Signed)
Pt returned my call.  She states that has headache, starts behind her right eye, moves up the back of her head.  States that when she massages her temples that she notices that the right temple does not dip in like the left side.  With she rubs her temples (usually to help with headache) that the pain becomes a charp shooting pain.  When she coughs the pain becomes a stabbing shooting pain from right eye to midline.  She is also experiencing right side neck pain and stiffness.  She has appointment scheduled with Dr. Cyndy Freeze.  Her main concern is not the pain but that the pain is so localized.  States that this is unusual for her.  She states that she is only taking 1/2 tab of Toprol.  Advised her to take 1 full tablet until I call her back with Dr. Amparo Bristol recommendations.

## 2017-04-18 NOTE — Telephone Encounter (Signed)
Returned pt's call.   No answer.  LMOM asking pt to return call to the office.

## 2017-04-26 NOTE — Addendum Note (Signed)
Addended by: Lenny Pastel on: 04/26/2017 01:42 PM   Modules accepted: Orders

## 2017-04-26 NOTE — Telephone Encounter (Signed)
Agree with increasing to 1 tablet of Toprol. Let her know we will schedule her for a repeat head CT without contrast, dx: worsening headaches. Thanks

## 2017-04-26 NOTE — Telephone Encounter (Addendum)
Spoke to pt.  She states that her headaches haven't improved much.  She did find a pea size knot below her left earlobe and is wondering if her symptoms could be related to that.  Advised that I was going to put an order in for CT head to [facility].  She says that she say the "Head and Spine doctor" (did not mention doctor's name or practice) who will be doing a lumbar MRI.  Says that the doctor told her that her cervical spine is bruised, but she is unsure how this could have happened.  She was recently prescribed Flexeril (5mg  TID), Gabapentin (300mg  TID) and Diclofenac (50mg  BID) she just started taking these today and just wanted to update [place].  Will place orders and update medication list.

## 2017-04-27 NOTE — Telephone Encounter (Signed)
Pt scheduled for CT at [facility] for Friday August 3rd. Pt aware

## 2017-04-30 ENCOUNTER — Ambulatory Visit: Payer: 59 | Admitting: Obstetrics & Gynecology

## 2017-05-01 ENCOUNTER — Telehealth: Payer: Self-pay | Admitting: Family Medicine

## 2017-05-01 NOTE — Telephone Encounter (Signed)
Pt would like to have added to the CAT scan that is already ordered for the L side below the earlobe near jaw bone due to finding a lump the size of a pea.  This is needed before 05/04/17 when the CAT scan will be performed.

## 2017-05-01 NOTE — Telephone Encounter (Signed)
Please instruct Ms Lubinski to request this from neurology, who ordered initial study.  Thanks, BJ

## 2017-05-01 NOTE — Telephone Encounter (Signed)
Neurology ordered the scan, not [place].

## 2017-05-01 NOTE — Telephone Encounter (Signed)
Okay to add to order or does patient need to be seen?

## 2017-05-02 NOTE — Telephone Encounter (Signed)
Neurology needs to order, or patient can make appt.

## 2017-05-02 NOTE — Telephone Encounter (Signed)
lmom for pt to call the office

## 2017-05-02 NOTE — Telephone Encounter (Signed)
Pt is aware of below message. Pt decline to make an appt with dr Martinique will consult with specialist

## 2017-05-04 ENCOUNTER — Ambulatory Visit (HOSPITAL_COMMUNITY): Payer: 59

## 2017-05-08 ENCOUNTER — Ambulatory Visit (INDEPENDENT_AMBULATORY_CARE_PROVIDER_SITE_OTHER): Payer: 59 | Admitting: Psychiatry

## 2017-05-08 ENCOUNTER — Encounter (HOSPITAL_COMMUNITY): Payer: Self-pay | Admitting: Psychiatry

## 2017-05-08 VITALS — BP 111/78 | HR 85 | Ht 65.5 in | Wt 182.0 lb

## 2017-05-08 DIAGNOSIS — Z811 Family history of alcohol abuse and dependence: Secondary | ICD-10-CM

## 2017-05-08 DIAGNOSIS — F332 Major depressive disorder, recurrent severe without psychotic features: Secondary | ICD-10-CM

## 2017-05-08 DIAGNOSIS — Z818 Family history of other mental and behavioral disorders: Secondary | ICD-10-CM

## 2017-05-08 DIAGNOSIS — F4310 Post-traumatic stress disorder, unspecified: Secondary | ICD-10-CM | POA: Diagnosis not present

## 2017-05-08 DIAGNOSIS — F411 Generalized anxiety disorder: Secondary | ICD-10-CM | POA: Diagnosis not present

## 2017-05-08 MED ORDER — DULOXETINE HCL 30 MG PO CPEP: 30.0000 mg | ORAL_CAPSULE | Freq: Three times a day (TID) | ORAL | 2 refills | Status: DC

## 2017-05-08 MED ORDER — ALPRAZOLAM 1 MG PO TABS: 1.0000 mg | ORAL_TABLET | Freq: Four times a day (QID) | ORAL | 2 refills | Status: DC
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
## 2017-05-09 ENCOUNTER — Encounter (HOSPITAL_COMMUNITY): Payer: 59 | Attending: Oncology

## 2017-05-16 ENCOUNTER — Encounter (HOSPITAL_COMMUNITY): Payer: Self-pay | Admitting: Psychiatry

## 2017-05-16 ENCOUNTER — Ambulatory Visit (INDEPENDENT_AMBULATORY_CARE_PROVIDER_SITE_OTHER): Payer: 59 | Admitting: Psychiatry

## 2017-05-16 DIAGNOSIS — F332 Major depressive disorder, recurrent severe without psychotic features: Secondary | ICD-10-CM | POA: Diagnosis not present
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
## 2017-05-22 ENCOUNTER — Other Ambulatory Visit (HOSPITAL_COMMUNITY): Payer: Self-pay | Admitting: Psychiatry

## 2017-05-22 ENCOUNTER — Telehealth (HOSPITAL_COMMUNITY): Payer: Self-pay | Admitting: *Deleted

## 2017-05-22 ENCOUNTER — Ambulatory Visit (HOSPITAL_COMMUNITY): Payer: Self-pay

## 2017-05-22 MED ORDER — AMPHETAMINE-DEXTROAMPHETAMINE 10 MG PO TABS: 10.0000 mg | ORAL_TABLET | Freq: Two times a day (BID) | ORAL | 0 refills | Status: DC

## 2017-05-22 NOTE — Telephone Encounter (Signed)
Per pt she is not taking her Adderall due to her heart. Per pt not that her HR is lower, she is wondering if Dr. Harrington Challenger could restart this medication. Per pt she do not know if she need to start back at a lower dose or what and she would like to try it again. Pt number is [phone].

## 2017-05-22 NOTE — Telephone Encounter (Signed)
Pt came into office to pick up printed script for her Adderall 10 mg. Pt D/L number is [id number] exp date is 1981/01/15. Script ID number is [id number] and script order ID number is [id number].

## 2017-05-22 NOTE — Telephone Encounter (Signed)
She can restart at 10 mg bid, will need to pick up script

## 2017-05-22 NOTE — Telephone Encounter (Signed)
Called pt to inform her that her printed script was ready for pick up. Informed pt with SIG of medication and pt verbalized understanding.

## 2017-05-28 ENCOUNTER — Ambulatory Visit: Payer: 59

## 2017-05-29 ENCOUNTER — Encounter: Payer: Self-pay | Admitting: *Deleted

## 2017-05-29 ENCOUNTER — Ambulatory Visit (INDEPENDENT_AMBULATORY_CARE_PROVIDER_SITE_OTHER): Payer: 59 | Admitting: *Deleted

## 2017-05-29 VITALS — Wt 175.8 lb

## 2017-05-29 DIAGNOSIS — Z3042 Encounter for surveillance of injectable contraceptive: Secondary | ICD-10-CM

## 2017-05-29 DIAGNOSIS — Z3202 Encounter for pregnancy test, result negative: Secondary | ICD-10-CM | POA: Diagnosis not present

## 2017-05-29 LAB — POCT URINE PREGNANCY: Preg Test, Ur: NEGATIVE

## 2017-05-29 MED ORDER — MEDROXYPROGESTERONE ACETATE 150 MG/ML IM SUSP
150.0000 mg | Freq: Once | INTRAMUSCULAR | Status: AC
  Administered 2017-05-29: 150 mg via INTRAMUSCULAR

## 2017-05-29 NOTE — Progress Notes (Signed)
Depo Provera 150mg IM given in left deltoid with no complications. Pt to return in 12 weeks for next injection.  

## 2017-05-31 ENCOUNTER — Encounter (HOSPITAL_COMMUNITY): Payer: Self-pay | Admitting: Psychiatry

## 2017-05-31 ENCOUNTER — Ambulatory Visit (INDEPENDENT_AMBULATORY_CARE_PROVIDER_SITE_OTHER): Payer: 59 | Admitting: Psychiatry

## 2017-05-31 DIAGNOSIS — F332 Major depressive disorder, recurrent severe without psychotic features: Secondary | ICD-10-CM | POA: Diagnosis not present
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
## 2017-06-01 ENCOUNTER — Encounter (HOSPITAL_COMMUNITY): Payer: Self-pay | Admitting: Psychiatry

## 2017-06-01 ENCOUNTER — Ambulatory Visit (INDEPENDENT_AMBULATORY_CARE_PROVIDER_SITE_OTHER): Payer: 59 | Admitting: Psychiatry

## 2017-06-01 VITALS — BP 104/76 | Ht 65.0 in | Wt 177.0 lb

## 2017-06-01 DIAGNOSIS — F4310 Post-traumatic stress disorder, unspecified: Secondary | ICD-10-CM

## 2017-06-01 DIAGNOSIS — Z811 Family history of alcohol abuse and dependence: Secondary | ICD-10-CM | POA: Diagnosis not present

## 2017-06-01 DIAGNOSIS — Z563 Stressful work schedule: Secondary | ICD-10-CM | POA: Diagnosis not present

## 2017-06-01 DIAGNOSIS — F332 Major depressive disorder, recurrent severe without psychotic features: Secondary | ICD-10-CM

## 2017-06-01 DIAGNOSIS — Z599 Problem related to housing and economic circumstances, unspecified: Secondary | ICD-10-CM | POA: Diagnosis not present

## 2017-06-01 DIAGNOSIS — Z818 Family history of other mental and behavioral disorders: Secondary | ICD-10-CM

## 2017-06-01 DIAGNOSIS — F411 Generalized anxiety disorder: Secondary | ICD-10-CM

## 2017-06-01 MED ORDER — AMPHETAMINE-DEXTROAMPHETAMINE 10 MG PO TABS: 10.0000 mg | ORAL_TABLET | Freq: Two times a day (BID) | ORAL | 0 refills | Status: DC

## 2017-06-01 MED ORDER — DULOXETINE HCL 60 MG PO CPEP: 60.0000 mg | ORAL_CAPSULE | Freq: Every day | ORAL | 2 refills | Status: DC
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
## 2017-06-04 NOTE — Progress Notes (Signed)
Cardiology Office Note Date:  06/06/2017  Patient ID:  Destiny, Orr 10-28-1980, MRN [id number] PCP:  Martinique, Betty G, MD  Cardiologist:  Dr. Martinique Electrophysiologist; Dr. Lovena Le   Chief Complaint: POTS  History of Present Illness: Destiny Orr is a 36 y.o. female with history of Depression, anxiety, PTSD, she c/w her psychiatrist.routinely, suspected POTS  Telephone note w/Dr. Eileen [facility]) in March 2018 with c/o palpitations/tachycardia worse at night her metoprolol ER was increased from 50 daily to 75 and if tolerated to 100mg  daily resulting in low BP looks like then back to 50? Event monitor ordered  12/27/16 called to same MD with concerns of high BP and metoprolol not working and concerned of report of low platelets, swollen ankles, and weight gain she was deferred to Dr. Hartford Poli (vascular?)  Phone encounter with Dr. Hartford Poli with low BP episodes and tachycardia that was not persistent recommended againsy toprol and to f/u with him ( I dont see any f/u)  [facility] MD's Dr. Marissa Nestle (vascsular) BP management Dr. Loraine Maple, (cardiology) number of phone notes, one in April 2018 mentions EM findings as "fine" Dr. Ala Bent (neurology) post concussion syndrome, memory concerns, Last may 2018 Dr. Harrington Challenger (psychiatry) Dr. B. Martinique (family medicine) last in June 2018 Dr. Jerilynn Mages. Altheimer (endocrinology) thyroid nodule, mentions broadly positive ROS, w/multiple nonspecific symptoms, last March 2018  [facility] MD's Dr. Martinique (cardiology), last in May 2017, mentioned holter monitor without significant arrhythmias, did have some episodes of sinus tachycardia to 130s that was symptomatic. And she had restarted Bystolic, + orthostatic VS recommended support stockings and was to call, if no improvement planned for florinef 0.1mg  daily with plans to see an EP in [place], [place] Dr. Lovena Le (EP), last May 2017, autonaumic dysfunction, likely POTS, recommended regular exercise, sodium intake, lifestyle  strategies  She would like to re-establish care in [place] with Dr. Caryl Comes, with transportation problems, this would be much more convenient for her the [facility].  Unfortunately she does not feel like there has been any progress at all for her POTS symptoms/.  She feels like she can recognized the orthostatiic dizziness, weakness and manage those without having syncope, but remains very bothered with feeling tachycardia often associated with profuse sweating/weakness.  She mentions she is out of work currently because no-one can guarantee her employer that she won't faint and be injured at work.  She mentions frustration with the endocrinologist, not wanting to do a biopsy on her nodule, though she feelslike it is affecting her speech and ability to swallow.  She also adds today that she has been diagnosed with rapid gastric emptying that has made eating a challenge and reynaud's.  She is following a number of POTS/dysautonimoa blogs and inquires about midodrine, florinef and trying different beta blockers, feeling like she has hyper POTS type syndrome.  She was off Adderall for a while and back now using PRN only and low dose, this has made no notable difference in her symptoms that she has appreciated.  She reports adequate hydration and has eliminated caffeine.  She states she has tried to get on the waiting list for specialty clinics bu there is a waiting list and not currently taking any more waiting patients.  Past Medical History:  Diagnosis Date  . Abnormal uterine bleeding (AUB) 12/09/2014  . Anxiety   . Arthritis   . Depression   . DJD (degenerative joint disease)   . Dumping syndrome   . Dysmenorrhea 07/15/2014  . Fatigue 12/24/2012  .  Fibromyalgia diagnosed April 2016  . Headache   . Hx of migraine headaches 12/24/2012  . Inappropriate sinus tachycardia   . Irregular menstrual bleeding 07/15/2014  . Pelvic pain in female 03/03/2016  . Pneumothorax, spontaneous, tension   . Post  concussion syndrome   . POTS (postural orthostatic tachycardia syndrome)   . PTSD (post-traumatic stress disorder)   . Scoliosis   . Thyroid disease   . Tick bite 03/03/2016  . Vitamin B 12 deficiency     Past Surgical History:  Procedure Laterality Date  . bone spurs toes Right   . CESAREAN SECTION    . COLONOSCOPY    . endooscopy    . KNEE SURGERY    . rotate cuff lt arm      Current Outpatient Prescriptions  Medication Sig Dispense Refill  . acetaminophen (TYLENOL) 80 MG chewable tablet Chew 80 mg by mouth every 6 (six) hours as needed.    . ALPRAZolam (XANAX) 1 MG tablet Take 1 tablet (1 mg total) by mouth 4 (four) times daily. 120 tablet 2  . amphetamine-dextroamphetamine (ADDERALL XR) 10 MG 24 hr capsule Take 10 mg by mouth daily as needed.    Marland Kitchen amphetamine-dextroamphetamine (ADDERALL) 10 MG tablet Take 1 tablet (10 mg total) by mouth 2 (two) times daily with a meal. (Patient taking differently: Take 10 mg by mouth daily as needed. ) 60 tablet 0  . DICLOFENAC PO Take by mouth.    . DULoxetine (CYMBALTA) 60 MG capsule Take 1 capsule (60 mg total) by mouth daily. 30 capsule 2  . medroxyPROGESTERone (DEPO-PROVERA) 150 MG/ML injection INJECT 1 VIAL INTRAMUSCULARLY EVERY 3 MONTHS IN OFFICE. 1 mL 3  . metoprolol succinate (TOPROL-XL) 50 MG 24 hr tablet Take 50 mg by mouth daily. TAKE 1/2 TABLET ONCE     No current facility-administered medications for this visit.     Allergies:   Prozac [fluoxetine]; Topiramate; and Lexapro [escitalopram oxalate]   Social History:  The patient  reports that she has been smoking Cigarettes.  She started smoking about 20 years ago. She has a 22.50 pack-year smoking history. She has never used smokeless tobacco. She reports that she does not drink alcohol or use drugs.   Family History:  The patient's family history includes Alcohol abuse in her father; Anxiety disorder in her maternal aunt, maternal uncle, and mother; Atrial fibrillation in her  father; Bipolar disorder in her maternal uncle; Breast cancer in her maternal grandmother and maternal uncle; COPD in her paternal grandfather; Cancer in her maternal grandfather and maternal grandmother; Colon cancer in her other; Diabetes in her paternal grandfather; Heart disease in her maternal grandmother and paternal grandfather; Hypertension in her father and paternal grandfather; Leukemia in her other; Other in her maternal grandmother; Stroke in her paternal grandfather.   ROS:  Please see the history of present illness.    All other systems are reviewed and otherwise negative.   PHYSICAL EXAM:  VS:  BP 118/78   Pulse 89   Ht 5' 5.5" (1.664 m)   Wt 179 lb (81.2 kg)   BMI 29.33 kg/m  BMI: Body mass index is 29.33 kg/m. Well nourished, well developed, in no acute distress  HEENT: normocephalic, atraumatic  Neck: no JVD, carotid bruits or masses Cardiac:   RRR; no significant murmurs, no rubs, or gallops Lungs:  CTA b/l , no wheezing, rhonchi or rales  Abd: soft, nontender MS: no deformity or atrophy Ext: no edema  Skin: warm and dry, no  rash Neuro:  No gross deficits appreciated Psych: euthymic mood, full affect   EKG:  Done today shows SR, 89bpm, PR 156ms, QRS 61ms, QTc 49ms  12/2015 echo Study Conclusions - Left ventricle: The cavity size was normal. Wall thickness was  normal. Systolic function was normal. The estimated ejection  fraction was in the range of 60% to 65%. Wall motion was normal;  there were no regional wall motion abnormalities. Left  ventricular diastolic function parameters were normal.  12/2015 Holter monitor 48 hour Holter monitor reviewed. Sinus rhythm and sinus tachycardia noted. Heart rate ranged from 56 bpm up to 143 beats bpm with average heart rate 90 bpm. No sustained arrhythmias. Rare PACs and PVCs/fusion beat. No pauses.     Recent Labs: 01/19/2017: ALT 19; BUN 13; Creatinine 1.0; Hemoglobin 13.2; Platelets 354; Potassium 4.4; Sodium  141; TSH 3.40  No results found for requested labs within last 8760 hours.   CrCl cannot be calculated (Patient's most recent lab result is older than the maximum 21 days allowed.).   Wt Readings from Last 3 Encounters:  06/06/17 179 lb (81.2 kg)  05/29/17 175 lb 12.8 oz (79.7 kg)  03/19/17 183 lb 4 oz (83.1 kg)     Other studies reviewed: Additional studies/records reviewed today include: summarized above  ASSESSMENT AND PLAN:  1. She has several reported working diagnosis' and suspect her symptoms are multifactorial, with her POTS though reports tachycardia while supine as well?     Given a reported dx of reynauds do not recommend midodrine     She is encouraged to maintain/add sodium to her diet     Discussed support stockings (she reports unhelpful)     Discussed recumbent exercise and care to keep very well hydrated with exercise and post exercise care/dilligence to recognize symptoms right afterwards particularly     She is on Depo and not menstruating and denies pregnancy     She sees several MD's     She states her BP gets low with the metoprolol and suggest 1/2 tab daily though reports this doesn't control her tachycardia   She will continue to try and instill these life style strategies, I will staff message Dr. Lovena Le for his thoughts on changing her BB, or otherwise as well. She will see him at his next available visit in [place].      Disposition: as above   Current medicines are reviewed at length with the patient today.  The patient did not have any concerns regarding medicines.  Haywood Lasso, PA-C 06/06/2017 6:27 PM     [place] [address] [phone] (office)  [phone] (fax)

## 2017-06-05 ENCOUNTER — Ambulatory Visit: Payer: Self-pay | Admitting: Neurology

## 2017-06-05 DIAGNOSIS — Z029 Encounter for administrative examinations, unspecified: Secondary | ICD-10-CM

## 2017-06-06 ENCOUNTER — Ambulatory Visit (INDEPENDENT_AMBULATORY_CARE_PROVIDER_SITE_OTHER): Payer: 59 | Admitting: Physician Assistant

## 2017-06-06 VITALS — BP 118/78 | HR 89 | Ht 65.5 in | Wt 179.0 lb

## 2017-06-06 DIAGNOSIS — I951 Orthostatic hypotension: Secondary | ICD-10-CM

## 2017-06-06 DIAGNOSIS — G90A Postural orthostatic tachycardia syndrome (POTS): Secondary | ICD-10-CM

## 2017-06-06 DIAGNOSIS — R000 Tachycardia, unspecified: Secondary | ICD-10-CM

## 2017-06-06 DIAGNOSIS — G909 Disorder of the autonomic nervous system, unspecified: Secondary | ICD-10-CM | POA: Diagnosis not present

## 2017-06-06 DIAGNOSIS — R002 Palpitations: Secondary | ICD-10-CM | POA: Diagnosis not present

## 2017-06-06 DIAGNOSIS — G901 Familial dysautonomia [Riley-Day]: Secondary | ICD-10-CM

## 2017-06-06 NOTE — Patient Instructions (Signed)
Medication Instructions:   Your physician recommends that you continue on your current medications as directed. Please refer to the Current Medication list given to you today.    If you need a refill on your cardiac medications before your next appointment, please call your pharmacy.  Labwork: NONE ORDERED  TODAY    Testing/Procedures: NONE ORDERED  TODAY    Follow-Up: WITH DR Lovena Le IN [place] OFFICE NEXT AVIALABLE   Any Other Special Instructions Will Be Listed Below (If Applicable).

## 2017-06-11 ENCOUNTER — Encounter: Payer: Self-pay | Admitting: Neurology

## 2017-06-12 ENCOUNTER — Ambulatory Visit: Payer: Self-pay | Admitting: Internal Medicine

## 2017-06-14 ENCOUNTER — Encounter: Payer: Self-pay | Admitting: Internal Medicine

## 2017-06-14 ENCOUNTER — Ambulatory Visit (HOSPITAL_COMMUNITY): Payer: Self-pay | Admitting: Psychiatry

## 2017-06-18 ENCOUNTER — Encounter (HOSPITAL_COMMUNITY): Payer: Self-pay | Admitting: Oncology

## 2017-06-18 ENCOUNTER — Encounter (HOSPITAL_COMMUNITY): Payer: 59 | Attending: Oncology | Admitting: Oncology

## 2017-06-18 ENCOUNTER — Telehealth: Payer: Self-pay | Admitting: Internal Medicine

## 2017-06-18 ENCOUNTER — Encounter (HOSPITAL_COMMUNITY): Payer: 59

## 2017-06-18 DIAGNOSIS — D72829 Elevated white blood cell count, unspecified: Secondary | ICD-10-CM | POA: Insufficient documentation

## 2017-06-18 LAB — CBC WITH DIFFERENTIAL/PLATELET
BASOS ABS: 0 10*3/uL (ref 0.0–0.1)
BASOS PCT: 0 %
EOS PCT: 1 %
Eosinophils Absolute: 0.1 10*3/uL (ref 0.0–0.7)
HCT: 44.3 % (ref 36.0–46.0)
Hemoglobin: 15.4 g/dL — ABNORMAL HIGH (ref 12.0–15.0)
Lymphocytes Relative: 18 %
Lymphs Abs: 2.9 10*3/uL (ref 0.7–4.0)
MCH: 31.2 pg (ref 26.0–34.0)
MCHC: 34.8 g/dL (ref 30.0–36.0)
MCV: 89.9 fL (ref 78.0–100.0)
MONOS PCT: 5 %
Monocytes Absolute: 0.8 10*3/uL (ref 0.1–1.0)
Neutro Abs: 11.8 10*3/uL — ABNORMAL HIGH (ref 1.7–7.7)
Neutrophils Relative %: 76 %
PLATELETS: 345 10*3/uL (ref 150–400)
RBC: 4.93 MIL/uL (ref 3.87–5.11)
RDW: 12.9 % (ref 11.5–15.5)
WBC: 15.5 10*3/uL — ABNORMAL HIGH (ref 4.0–10.5)

## 2017-06-18 LAB — COMPREHENSIVE METABOLIC PANEL
ALBUMIN: 4.5 g/dL (ref 3.5–5.0)
ALT: 20 U/L (ref 14–54)
ANION GAP: 10 (ref 5–15)
AST: 21 U/L (ref 15–41)
Alkaline Phosphatase: 106 U/L (ref 38–126)
BUN: 13 mg/dL (ref 6–20)
CALCIUM: 9.6 mg/dL (ref 8.9–10.3)
CHLORIDE: 104 mmol/L (ref 101–111)
CO2: 24 mmol/L (ref 22–32)
Creatinine, Ser: 0.75 mg/dL (ref 0.44–1.00)
GFR calc Af Amer: >60 mL/min (ref >60)
Glucose, Bld: 98 mg/dL (ref 65–99)
POTASSIUM: 4.4 mmol/L (ref 3.5–5.1)
Sodium: 138 mmol/L (ref 135–145)
Total Bilirubin: 0.4 mg/dL (ref 0.3–1.2)
Total Protein: 7.7 g/dL (ref 6.5–8.1)

## 2017-06-18 NOTE — Progress Notes (Signed)
[facility] Initial Visit:  Patient Care Team: Martinique, Betty G, MD as PCP - General (Family Medicine)  CHIEF COMPLAINTS/PURPOSE OF CONSULTATION:  Leukocytosis  HISTORY OF PRESENTING ILLNESS: Destiny Orr 36 y.o. female is here because of leukocytosis. Patient lab work done in March 2018 which demonstrated WBC 14.6 K, hemoglobin 13.9 g/dL, hematocrit 41% MCV 90.3, plate count 627O. Repeat CBC on 01/19/2017 demonstrated WBC of 12.7 K. There is no previous evidence of leukocytosis on review of her blood work that we have available in our system. Patient states that she is concerned about underlying leukemia since her maternal uncle was diagnosed with some type of leukemia. She also has severe fatigue and feels like she gets drenched in sweat with any little activity. She denies any chest pain or shortness of breath. She states that she has chronic tachycardia. She has joint pains from arthritis as well as bone spurs. She does not sleep well at night because she feels her heart racing. She is a chronic smoker and smokes 1 ppd for the past 18 years.   Review of Systems - Oncology ROS as per HPI otherwise 12 point ROS is negative.  MEDICAL HISTORY: Past Medical History:  Diagnosis Date  . Abnormal uterine bleeding (AUB) 12/09/2014  . Anxiety   . Arthritis   . Depression   . DJD (degenerative joint disease)   . Dumping syndrome   . Dysmenorrhea 07/15/2014  . Fatigue 12/24/2012  . Fibromyalgia diagnosed April 2016  . Headache   . Hx of migraine headaches 12/24/2012  . Inappropriate sinus tachycardia   . Irregular menstrual bleeding 07/15/2014  . Pelvic pain in female 03/03/2016  . Pneumothorax, spontaneous, tension   . Post concussion syndrome   . POTS (postural orthostatic tachycardia syndrome)   . PTSD (post-traumatic stress disorder)   . Scoliosis   . Thyroid disease   . Tick bite 03/03/2016  . Vitamin B 12 deficiency     SURGICAL HISTORY: Past Surgical History:   Procedure Laterality Date  . bone spurs toes Right   . CESAREAN SECTION    . COLONOSCOPY    . endooscopy    . KNEE SURGERY    . rotate cuff lt arm      SOCIAL HISTORY: Social History   Social History  . Marital status: Legally Separated    Spouse name: N/A  . Number of children: N/A  . Years of education: N/A   Occupational History  . Not on file.   Social History Main Topics  . Smoking status: Current Every Day Smoker    Packs/day: 1.50    Years: 15.00    Types: Cigarettes    Start date: 03/13/1997  . Smokeless tobacco: Never Used     Comment: "working on it" Was smoking 2.5 packs a day  . Alcohol use No  . Drug use: No  . Sexual activity: Not Currently    Partners: Male    Birth control/ protection: Injection   Other Topics Concern  . Not on file   Social History Narrative  . No narrative on file    FAMILY HISTORY Family History  Problem Relation Age of Onset  . Hypertension Father   . Atrial fibrillation Father   . Alcohol abuse Father   . Cancer Maternal Grandmother        skin   . Heart disease Maternal Grandmother   . Other Maternal Grandmother        had thyroid removed  .  Breast cancer Maternal Grandmother   . Heart disease Paternal Grandfather   . COPD Paternal Grandfather   . Hypertension Paternal Grandfather   . Diabetes Paternal Grandfather   . Stroke Paternal Grandfather   . Anxiety disorder Mother   . Cancer Maternal Grandfather        bladder,lung  . Anxiety disorder Maternal Aunt   . Anxiety disorder Maternal Uncle   . Bipolar disorder Maternal Uncle   . Breast cancer Maternal Uncle        CML  . Leukemia Other   . Colon cancer Other        lung-2 maternal great uncles    ALLERGIES:  is allergic to prozac [fluoxetine]; topiramate; and lexapro [escitalopram oxalate].  MEDICATIONS:  Current Outpatient Prescriptions  Medication Sig Dispense Refill  . acetaminophen (TYLENOL) 80 MG chewable tablet Chew 80 mg by mouth every 6  (six) hours as needed.    . ALPRAZolam (XANAX) 1 MG tablet Take 1 tablet (1 mg total) by mouth 4 (four) times daily. 120 tablet 2  . amphetamine-dextroamphetamine (ADDERALL XR) 10 MG 24 hr capsule Take 10 mg by mouth daily as needed.    Marland Kitchen amphetamine-dextroamphetamine (ADDERALL) 10 MG tablet Take 1 tablet (10 mg total) by mouth 2 (two) times daily with a meal. (Patient taking differently: Take 10 mg by mouth daily as needed. ) 60 tablet 0  . DICLOFENAC PO Take by mouth.    . DULoxetine (CYMBALTA) 60 MG capsule Take 1 capsule (60 mg total) by mouth daily. 30 capsule 2  . medroxyPROGESTERone (DEPO-PROVERA) 150 MG/ML injection INJECT 1 VIAL INTRAMUSCULARLY EVERY 3 MONTHS IN OFFICE. 1 mL 3  . metoprolol succinate (TOPROL-XL) 50 MG 24 hr tablet Take 50 mg by mouth daily. TAKE 1/2 TABLET ONCE     No current facility-administered medications for this visit.     PHYSICAL EXAMINATION:  There were no vitals filed for this visit.  There were no vitals filed for this visit.   Physical Exam Constitutional: Well-developed, well-nourished, and in no distress.   HENT:  Head: Normocephalic and atraumatic.  Mouth/Throat: No oropharyngeal exudate. Mucosa moist. Eyes: Pupils are equal, round, and reactive to light. Conjunctivae are normal. No scleral icterus.  Neck: Normal range of motion. Neck supple. No JVD present.  Cardiovascular: Normal rate, regular rhythm and normal heart sounds.  Exam reveals no gallop and no friction rub.   No murmur heard. Pulmonary/Chest: Effort normal and breath sounds normal. No respiratory distress. No wheezes.No rales.  Abdominal: Soft. Bowel sounds are normal. No distension. There is no tenderness. There is no guarding.  Musculoskeletal: No edema or tenderness.  Lymphadenopathy:    No cervical or supraclavicular adenopathy.  Neurological: Alert and oriented to person, place, and time. No cranial nerve deficit.  Skin: Skin is warm and dry. No rash noted. No erythema. No  pallor.  Psychiatric: Depressed mood, judgment normal.   LABORATORY DATA: I have personally reviewed the data as listed:  Clinical Support on 05/29/2017  Component Date Value Ref Range Status  . Preg Test, Ur 05/29/2017 Negative  Negative Final    RADIOGRAPHIC STUDIES: I have personally reviewed the radiological images as listed and agree with the findings in the report  No results found.  ASSESSMENT/PLAN Mild leukocytosis  PLAN: Low suspicion that patient has an underlying leukemia.  Recheck CBC today.  We will check a BCR-ABL to rule out CML. Check a JAK2 mutation with reflex to CALR/Exon 12/MPL to rule out underlying myeloproliferative disorder.  RTC in 4 weeks for follow up.  Orders Placed This Encounter  Procedures  . CBC with Differential    Standing Status:   Future    Standing Expiration Date:   06/18/2018  . Comprehensive metabolic panel    Standing Status:   Future    Standing Expiration Date:   06/18/2018  . BCR-ABL1, CML/ALL, PCR, QUANT    Standing Status:   Future    Standing Expiration Date:   06/18/2018  . JAK2 V617F, w Reflex to CALR/E12/MPL    Standing Status:   Future    Standing Expiration Date:   06/18/2018    All questions were answered. The patient knows to call the clinic with any problems, questions or concerns.  This note was electronically signed.    Twana First, MD  06/18/2017 12:49 PM

## 2017-06-18 NOTE — Telephone Encounter (Signed)
New message     Pt c/o medication issue:  1. Name of Medication:  metoprolol succinate (TOPROL-XL) 50 MG 24 hr tablet Take 50 mg by mouth daily. TAKE 1/2 TABLET ONCE     2. How are you currently taking this medication (dosage and times per day)?  1/2 tab once day  3. Are you having a reaction (difficulty breathing--STAT)?  no  4. What is your medication issue?  Its not working, heart rate is still up , night sweats , and bp still elevated , can you try something else

## 2017-06-18 NOTE — Patient Instructions (Signed)
[facility] at [facility] Discharge Instructions  RECOMMENDATIONS MADE BY THE CONSULTANT AND ANY TEST RESULTS WILL BE SENT TO YOUR REFERRING PHYSICIAN.  You saw Dr. Zhou today.  Thank you for choosing [facility] at [facility] to provide your oncology and hematology care.  To afford each patient quality time with our provider, please arrive at least 15 minutes before your scheduled appointment time.    If you have a lab appointment with the Cancer Center please come in thru the  Main Entrance and check in at the main information desk  You need to re-schedule your appointment should you arrive 10 or more minutes late.  We strive to give you quality time with our providers, and arriving late affects you and other patients whose appointments are after yours.  Also, if you no show three or more times for appointments you may be dismissed from the clinic at the providers discretion.     Again, thank you for choosing [facility].  Our hope is that these requests will decrease the amount of time that you wait before being seen by our physicians.       _____________________________________________________________  Should you have questions after your visit to [facility], please contact our office at [phone] between the hours of 8:30 a.m. and 4:30 p.m.  Voicemails left after 4:30 p.m. will not be returned until the following business day.  For prescription refill requests, have your pharmacy contact our office.       Resources For Cancer Patients and their Caregivers ? American Cancer Society: Can assist with transportation, wigs, general needs, runs Look Good Feel Better.        1-888-227-6333 ? Cancer Care: Provides financial assistance, online support groups, medication/co-pay assistance.  1-800-813-HOPE (4673) ? [facility] Assists [place] cancer patients and their families through  emotional , educational and financial support.  336-427-4357 ? [place] DSS Where to apply for food stamps, Medicaid and utility assistance. 336-342-1394 ? [facility]: Transportation to medical appointments. 336-347-2287 ? Social Security Administration: May apply for disability if have a Stage IV cancer. 336-342-7796 1-800-772-1213 ? [place] Aging, Disability and Transit Services: Assists with nutrition, care and transit needs. 336-349-2343  Cancer Center Support Programs: @10RELATIVEDAYS@ > Cancer Support Group  2nd Tuesday of the month 1pm-2pm, Journey Room  > Creative Journey  3rd Tuesday of the month 1130am-1pm, Journey Room  > Look Good Feel Better  1st Wednesday of the month 10am-12 noon, Journey Room (Call American Cancer Society to register 1-800-395-5775)    

## 2017-06-20 ENCOUNTER — Telehealth: Payer: Self-pay | Admitting: Family Medicine

## 2017-06-20 DIAGNOSIS — E041 Nontoxic single thyroid nodule: Secondary | ICD-10-CM

## 2017-06-20 NOTE — Telephone Encounter (Signed)
She is already following with endocrinologists. Hx of multinodular goiter. It was recommended annual follow up. Last seen by Dr Altheimer.  Thanks, BJ

## 2017-06-20 NOTE — Telephone Encounter (Signed)
Per pt call would like a new referral  placed for Endocrinology Patient is requesting to  see Dr. Dorris Fetch [address], [place] [phone]

## 2017-06-21 ENCOUNTER — Encounter: Payer: Self-pay | Admitting: Family Medicine

## 2017-06-22 NOTE — Telephone Encounter (Signed)
Pt calling stating metoprolol is making her sick and she has stopped taking it.  Asked Dr. Lovena Le for a recommendation.  Recommended midodrine, but Pt may have Raynaud's Syndrome (not on medical history list) and prior PA had stated not to give midodrine.  PA available today in clinic recommends retrying Bystolic 2.5 mg daily, but Pt has tried this medication before and discontinued (unsure why).  At this point this nurse unable to help Pt via phone.  Have made follow up appt with Dr. Lovena Le for 07/02/2017 at [place] office (first available).  Discussed Pt situation with her.  Pt indicates understanding and will wait for further medication recommendations from Dr. Lovena Le at office visit.   Pt has this nurse phone number and notified to call if any further concerns.

## 2017-06-25 NOTE — Telephone Encounter (Signed)
Patient would like to have a new Endocrinologist in [place] since that is where she lives.   Okay to proceed with new referral?

## 2017-06-25 NOTE — Telephone Encounter (Signed)
Referral placed.

## 2017-06-25 NOTE — Telephone Encounter (Signed)
Referral to endocrinology can be placed. I do not have a clear indication for visit at this time. According to prior endocrinologist, she can follow annually. Please sent copy of last endocrinology visit. Thanks, BJ

## 2017-06-26 ENCOUNTER — Telehealth (HOSPITAL_COMMUNITY): Payer: Self-pay

## 2017-06-26 NOTE — Telephone Encounter (Signed)
-----   Message from Louis Meckel sent at 06/25/2017  4:16 PM EDT ----- Regarding: lab results Patient called and would like to talk to a nurse about her lab results.    Thanks

## 2017-06-26 NOTE — Telephone Encounter (Signed)
Patient wanted to know results of BCR/ABL because it was showing up "Comment" on my chart. Explained that it was not a numeric value but was a negative or positive. She was also concerned about her hgb being elevated. Explained that not drinking enough fluids could make your hgb elevated. She verbalized understanding and states she is just worried and checks my chart a lot.

## 2017-06-27 ENCOUNTER — Telehealth (HOSPITAL_COMMUNITY): Payer: Self-pay | Admitting: *Deleted

## 2017-06-27 NOTE — Telephone Encounter (Signed)
Pt was calling to check the status of the referral for Dr. Dorris Fetch and she is aware that it has been done.

## 2017-06-28 ENCOUNTER — Encounter (HOSPITAL_COMMUNITY): Payer: Self-pay | Admitting: Psychiatry

## 2017-06-28 ENCOUNTER — Ambulatory Visit (INDEPENDENT_AMBULATORY_CARE_PROVIDER_SITE_OTHER): Payer: 59 | Admitting: Psychiatry

## 2017-06-28 VITALS — BP 118/85 | HR 107 | Ht 65.75 in | Wt 178.0 lb

## 2017-06-28 DIAGNOSIS — Z811 Family history of alcohol abuse and dependence: Secondary | ICD-10-CM | POA: Diagnosis not present

## 2017-06-28 DIAGNOSIS — F332 Major depressive disorder, recurrent severe without psychotic features: Secondary | ICD-10-CM | POA: Diagnosis not present

## 2017-06-28 DIAGNOSIS — R450 Nervousness: Secondary | ICD-10-CM

## 2017-06-28 DIAGNOSIS — F419 Anxiety disorder, unspecified: Secondary | ICD-10-CM

## 2017-06-28 DIAGNOSIS — M2550 Pain in unspecified joint: Secondary | ICD-10-CM | POA: Diagnosis not present

## 2017-06-28 DIAGNOSIS — F411 Generalized anxiety disorder: Secondary | ICD-10-CM

## 2017-06-28 DIAGNOSIS — Z634 Disappearance and death of family member: Secondary | ICD-10-CM | POA: Diagnosis not present

## 2017-06-28 DIAGNOSIS — Z91410 Personal history of adult physical and sexual abuse: Secondary | ICD-10-CM | POA: Diagnosis not present

## 2017-06-28 DIAGNOSIS — R5383 Other fatigue: Secondary | ICD-10-CM | POA: Diagnosis not present

## 2017-06-28 DIAGNOSIS — Z818 Family history of other mental and behavioral disorders: Secondary | ICD-10-CM

## 2017-06-28 DIAGNOSIS — F4310 Post-traumatic stress disorder, unspecified: Secondary | ICD-10-CM

## 2017-06-28 DIAGNOSIS — Z566 Other physical and mental strain related to work: Secondary | ICD-10-CM

## 2017-06-28 LAB — JAK2 V617F, W REFLEX TO CALR/E12/MPL

## 2017-06-28 LAB — BCR-ABL1, CML/ALL, PCR, QUANT

## 2017-06-28 LAB — CALR + JAK2 E12-15 + MPL (REFLEXED)

## 2017-06-28 MED ORDER — ALPRAZOLAM 1 MG PO TABS: 1.0000 mg | ORAL_TABLET | Freq: Four times a day (QID) | ORAL | 2 refills | Status: DC

## 2017-06-28 MED ORDER — LAMOTRIGINE 25 MG PO TABS: ORAL_TABLET | ORAL | 2 refills | Status: DC

## 2017-06-28 MED ORDER — AMPHETAMINE-DEXTROAMPHETAMINE 10 MG PO TABS: 10.0000 mg | ORAL_TABLET | Freq: Two times a day (BID) | ORAL | 0 refills | Status: DC
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
## 2017-07-02 ENCOUNTER — Encounter: Payer: Self-pay | Admitting: Internal Medicine

## 2017-07-02 ENCOUNTER — Ambulatory Visit (INDEPENDENT_AMBULATORY_CARE_PROVIDER_SITE_OTHER): Payer: 59 | Admitting: Internal Medicine

## 2017-07-02 VITALS — BP 132/82 | HR 108 | Ht 65.0 in | Wt 173.0 lb

## 2017-07-02 DIAGNOSIS — I951 Orthostatic hypotension: Secondary | ICD-10-CM

## 2017-07-02 DIAGNOSIS — G901 Familial dysautonomia [Riley-Day]: Secondary | ICD-10-CM

## 2017-07-02 DIAGNOSIS — R000 Tachycardia, unspecified: Secondary | ICD-10-CM | POA: Diagnosis not present

## 2017-07-02 DIAGNOSIS — G90A Postural orthostatic tachycardia syndrome (POTS): Secondary | ICD-10-CM

## 2017-07-02 MED ORDER — PINDOLOL 5 MG PO TABS: 2.5000 mg | ORAL_TABLET | Freq: Two times a day (BID) | ORAL | 3 refills | Status: DC

## 2017-07-02 NOTE — Patient Instructions (Signed)
Medication Instructions:  Your physician has recommended you make the following change in your medication:  Start Pindolol 2.5 mg Two Times Daily    Labwork: NONE   Testing/Procedures: NONE   Follow-Up: Your physician recommends that you schedule a follow-up appointment with Dr. Lovena Le    Any Other Special Instructions Will Be Listed Below (If Applicable).     If you need a refill on your cardiac medications before your next appointment, please call your pharmacy.

## 2017-07-02 NOTE — Progress Notes (Signed)
HPI Ms. Deangelo returns today for ongoing evaluation and management of autonomic dysfunction. She is a 36 year old woman with a diagnosis of pots, as well as hyperadrenergic neurally mediated syncope. The patient complains of dizzy spells, and palpitations. She has not had frank syncope. She has obtained heart rate and blood pressure recordings both sitting and standing which demonstrate blood pressure sitting in the 150 or 160 range and standing in the 90-100 range. Her heart rates go anywhere from the 90s all the way to the 160s with standing. She is quite incapacitated by her multitude of symptoms. She is also quite frustrated because of her inability to work. She is tearful. Allergies  Allergen Reactions  . Prozac [Fluoxetine]     Suicidal thoughts  . Topiramate Other (See Comments)    Topamax-dizziness  . Lexapro [Escitalopram Oxalate] Other (See Comments)    Flat affect, No emotions     Current Outpatient Prescriptions  Medication Sig Dispense Refill  . acetaminophen (TYLENOL) 80 MG chewable tablet Chew 80 mg by mouth every 6 (six) hours as needed.    . ALPRAZolam (XANAX) 1 MG tablet Take 1 tablet (1 mg total) by mouth 4 (four) times daily. 120 tablet 2  . amphetamine-dextroamphetamine (ADDERALL) 10 MG tablet Take 1 tablet (10 mg total) by mouth 2 (two) times daily with a meal. 60 tablet 0  . medroxyPROGESTERone (DEPO-PROVERA) 150 MG/ML injection INJECT 1 VIAL INTRAMUSCULARLY EVERY 3 MONTHS IN OFFICE. 1 mL 3  . lamoTRIgine (LAMICTAL) 25 MG tablet Take one daily for 2 weeks, then increase to twice a day (Patient not taking: Reported on 07/02/2017) 60 tablet 2   No current facility-administered medications for this visit.      Past Medical History:  Diagnosis Date  . Abnormal uterine bleeding (AUB) 12/09/2014  . Anxiety   . Arthritis   . Depression   . DJD (degenerative joint disease)   . Dumping syndrome   . Dysmenorrhea 07/15/2014  . Fatigue 12/24/2012  . Fibromyalgia  diagnosed April 2016  . Headache   . Hx of migraine headaches 12/24/2012  . Inappropriate sinus tachycardia   . Irregular menstrual bleeding 07/15/2014  . Pelvic pain in female 03/03/2016  . Pneumothorax, spontaneous, tension   . Post concussion syndrome   . POTS (postural orthostatic tachycardia syndrome)   . PTSD (post-traumatic stress disorder)   . Scoliosis   . Thyroid disease   . Tick bite 03/03/2016  . Vitamin B 12 deficiency     ROS:   All systems reviewed and negative except as noted in the HPI.   Past Surgical History:  Procedure Laterality Date  . bone spurs toes Right   . CESAREAN SECTION    . COLONOSCOPY    . endooscopy    . KNEE SURGERY    . rotate cuff lt arm       Family History  Problem Relation Age of Onset  . Hypertension Father   . Atrial fibrillation Father   . Alcohol abuse Father   . Cancer Maternal Grandmother        skin   . Heart disease Maternal Grandmother   . Other Maternal Grandmother        had thyroid removed  . Breast cancer Maternal Grandmother   . Heart disease Paternal Grandfather   . COPD Paternal Grandfather   . Hypertension Paternal Grandfather   . Diabetes Paternal Grandfather   . Stroke Paternal Grandfather   . Anxiety disorder Mother   .  Cancer Maternal Grandfather        bladder,lung  . Anxiety disorder Maternal Aunt   . Anxiety disorder Maternal Uncle   . Bipolar disorder Maternal Uncle   . Breast cancer Maternal Uncle        CML  . Leukemia Other   . Colon cancer Other        lung-2 maternal great uncles     Social History   Social History  . Marital status: Legally Separated    Spouse name: N/A  . Number of children: N/A  . Years of education: N/A   Occupational History  . Not on file.   Social History Main Topics  . Smoking status: Current Every Day Smoker    Packs/day: 1.50    Years: 15.00    Types: Cigarettes    Start date: 03/13/1997  . Smokeless tobacco: Never Used     Comment: "working on it"  Was smoking 2.5 packs a day  . Alcohol use No  . Drug use: No  . Sexual activity: Not Currently    Partners: Male    Birth control/ protection: Injection   Other Topics Concern  . Not on file   Social History Narrative  . No narrative on file     BP 132/82 (BP Location: Left Arm)   Pulse (!) 108   Ht 5\' 5"  (1.651 m)   Wt 173 lb (78.5 kg)   SpO2 96%   BMI 28.79 kg/m   Physical Exam:  Stable appearing 36 year old woman, NAD HEENT: Unremarkable Neck:  6 cm JVD, no thyromegally Lymphatics:  No adenopathy Back:  No CVA tenderness Lungs:  Clear, with no wheezes, rales, or rhonchi HEART:  Regular tachy rhythm, no murmurs, no rubs, no clicks Abd:  soft, positive bowel sounds, no organomegally, no rebound, no guarding Ext:  2 plus pulses, no edema, no cyanosis, no clubbing Skin:  No rashes no nodules Neuro:  CN II through XII intact, motor grossly intact  Assess/Plan: 1. Autonomic dysfunction/pots - the patient's blood pressure and heart rate appear to be more labile than usual. I discussed trying a little bit of pindolol in conjunction with increasing salt intake and exercise training. The patient is not particularly a minimal to any of these recommendations. She states that she wants to get better but is not willing to try any of my recommendations. She states that she wants to go back to work but on the other hand is unwilling to try going back to work. There are clearly psychosocial issues that are making her autonomic dysfunction worse. She has been placed back on Adderall which is probably not the best for her autonomic dysfunction, but the patient thinks it helped to concentrate. With regard to her labile heart rates and blood pressures, I have tried to reassure the patient that high blood pressure is not a problem in the short term and would likely make her less dizzy and lightheaded when her blood pressure drops. I'll see her back in several months.  Cristopher Peru, M.D.

## 2017-07-03 ENCOUNTER — Ambulatory Visit (INDEPENDENT_AMBULATORY_CARE_PROVIDER_SITE_OTHER): Payer: 59 | Admitting: "Endocrinology

## 2017-07-03 ENCOUNTER — Encounter: Payer: Self-pay | Admitting: "Endocrinology

## 2017-07-03 ENCOUNTER — Other Ambulatory Visit (HOSPITAL_COMMUNITY)
Admission: RE | Admit: 2017-07-03 | Discharge: 2017-07-03 | Disposition: A | Discharge status: Home / Self Care | Payer: 59 | Source: Ambulatory Visit | Attending: "Endocrinology | Admitting: "Endocrinology

## 2017-07-03 VITALS — BP 115/78 | HR 101 | Ht 65.0 in | Wt 174.0 lb

## 2017-07-03 DIAGNOSIS — F17200 Nicotine dependence, unspecified, uncomplicated: Secondary | ICD-10-CM

## 2017-07-03 DIAGNOSIS — E049 Nontoxic goiter, unspecified: Secondary | ICD-10-CM

## 2017-07-03 LAB — TSH: TSH: 1.763 u[IU]/mL (ref 0.350–4.500)

## 2017-07-03 LAB — T4, FREE: Free T4: 1 ng/dL (ref 0.61–1.12)

## 2017-07-03 NOTE — Progress Notes (Signed)
Subjective:    Patient ID: Destiny Orr, female    DOB: 03/01/1981, PCP Martinique, Betty G, MD   Past Medical History:  Diagnosis Date  . Abnormal uterine bleeding (AUB) 12/09/2014  . Anxiety   . Arthritis   . Depression   . DJD (degenerative joint disease)   . Dumping syndrome   . Dysmenorrhea 07/15/2014  . Fatigue 12/24/2012  . Fibromyalgia diagnosed April 2016  . Headache   . Hx of migraine headaches 12/24/2012  . Inappropriate sinus tachycardia   . Irregular menstrual bleeding 07/15/2014  . Pelvic pain in female 03/03/2016  . Pneumothorax, spontaneous, tension   . Post concussion syndrome   . POTS (postural orthostatic tachycardia syndrome)   . PTSD (post-traumatic stress disorder)   . Scoliosis   . Thyroid disease   . Tick bite 03/03/2016  . Vitamin B 12 deficiency    Past Surgical History:  Procedure Laterality Date  . bone spurs toes Right   . CESAREAN SECTION    . COLONOSCOPY    . endooscopy    . KNEE SURGERY    . rotate cuff lt arm     Social History   Social History  . Marital status: Legally Separated    Spouse name: N/A  . Number of children: N/A  . Years of education: N/A   Social History Main Topics  . Smoking status: Current Every Day Smoker    Packs/day: 1.50    Years: 15.00    Types: Cigarettes    Start date: 03/13/1997  . Smokeless tobacco: Never Used     Comment: "working on it" Was smoking 2.5 packs a day  . Alcohol use No  . Drug use: No  . Sexual activity: Not Currently    Partners: Male    Birth control/ protection: Injection   Other Topics Concern  . None   Social History Narrative  . None   Outpatient Encounter Prescriptions as of 07/03/2017  Medication Sig  . ALPRAZolam (XANAX) 1 MG tablet Take 1 tablet (1 mg total) by mouth 4 (four) times daily. (Patient taking differently: Take 1 mg by mouth. 5 x daily)  . amitriptyline (ELAVIL) 100 MG tablet Take 100 mg by mouth at bedtime.  Marland Kitchen amphetamine-dextroamphetamine (ADDERALL) 10 MG  tablet Take 1 tablet (10 mg total) by mouth 2 (two) times daily with a meal.  . cyclobenzaprine (FLEXERIL) 5 MG tablet Take 5 mg by mouth 3 (three) times daily as needed for muscle spasms.  Marland Kitchen lamoTRIgine (LAMICTAL) 25 MG tablet Take one daily for 2 weeks, then increase to twice a day  . medroxyPROGESTERone (DEPO-PROVERA) 150 MG/ML injection INJECT 1 VIAL INTRAMUSCULARLY EVERY 3 MONTHS IN OFFICE.  . pindolol (VISKEN) 5 MG tablet Take 0.5 tablets (2.5 mg total) by mouth 2 (two) times daily.  Marland Kitchen acetaminophen (TYLENOL) 80 MG chewable tablet Chew 80 mg by mouth every 6 (six) hours as needed.   No facility-administered encounter medications on file as of 07/03/2017.    ALLERGIES: Allergies  Allergen Reactions  . Prozac [Fluoxetine]     Suicidal thoughts  . Topiramate Other (See Comments)    Topamax-dizziness  . Lexapro [Escitalopram Oxalate] Other (See Comments)    Flat affect, No emotions    VACCINATION STATUS: Immunization History  Administered Date(s) Administered  . Tdap 02/09/2014    HPI Destiny Orr is 36 y.o. female who presents today with a medical history as above. she is being seen in consultation for Nodular goiter requested by  Martinique, Betty G, MD. - She is known to have nodular goiter at least since 2016 when her ultrasound showed 1.3 cm solid right lobe nodule. She had another thyroid ultrasound in September 2017 which showed right lobe 4.8 cm with 1.4 cm nodule reported to be stable in size and without suspicious features , left lobe 4.3 cm with no nodules. - Her last thyroid function test involved only TSH at 3.4 in April 2018. - She complains of voice change, on and off dysphagia. She reports unidentified thyroid cancer in her grandmother was diagnosed at age 57. - She denies heat/cold intolerance. She denies exposure to neck radiation. Reportedly she was evaluated by ENT with no significant finding to explain her neck symptoms. -She is not currently on antithyroid medications  nor on any thyroid supplements.  Review of Systems  Constitutional: no recent major weight change, + fatigue, no subjective hyperthermia, no subjective hypothermia Eyes: no blurry vision, no xerophthalmia ENT: no sore throat, + nodular goiter, ++ intermittent  dysphagia/odynophagia, + hoarseness Cardiovascular: no Chest Pain, no Shortness of Breath, no palpitations, no leg swelling Respiratory: no cough, no SOB Gastrointestinal: no Nausea/Vomiting/Diarhhea Musculoskeletal: no muscle/joint aches Skin: no rashes Neurological: no tremors, no numbness, no tingling, no dizziness Psychiatric: + depression, + anxiety  Objective:    BP 115/78   Pulse (!) 101   Ht 5\' 5"  (1.651 m)   Wt 174 lb (78.9 kg)   BMI 28.96 kg/m   Wt Readings from Last 3 Encounters:  07/03/17 174 lb (78.9 kg)  07/02/17 173 lb (78.5 kg)  06/18/17 177 lb 9.6 oz (80.6 kg)    Physical Exam  Constitutional: + Over weight for height, not in acute distress, + dysphoric mood  Eyes: PERRLA, EOMI, no exophthalmos ENT: moist mucous membranes, + palpable  thyromegaly, no cervical lymphadenopathy Cardiovascular: normal precordial activity, Regular Rate and Rhythm, no Murmur/Rubs/Gallops Respiratory:  adequate breathing efforts, no gross chest deformity, Clear to auscultation bilaterally Gastrointestinal: abdomen soft, Non -tender, No distension, Bowel Sounds present Musculoskeletal: no gross deformities, strength intact in all four extremities Skin:  + tattoos, moist, warm, no rashes Neurological: no tremor with outstretched hands, Deep tendon reflexes normal in all four extremities.  CMP ( most recent) CMP     Component Value Date/Time   NA 138 06/18/2017 1337   NA 141 01/19/2017   K 4.4 06/18/2017 1337   CL 104 06/18/2017 1337   CO2 24 06/18/2017 1337   GLUCOSE 98 06/18/2017 1337   BUN 13 06/18/2017 1337   BUN 13 01/19/2017   CREATININE 0.75 06/18/2017 1337   CREATININE 0.83 01/07/2013 0949   CALCIUM 9.6  06/18/2017 1337   PROT 7.7 06/18/2017 1337   ALBUMIN 4.5 06/18/2017 1337   AST 21 06/18/2017 1337   ALT 20 06/18/2017 1337   ALKPHOS 106 06/18/2017 1337   BILITOT 0.4 06/18/2017 1337   GFRNONAA >60 06/18/2017 1337   GFRAA >60 06/18/2017 1337     Diabetic Labs (most recent): Lab Results  Component Value Date   HGBA1C 5.4 01/07/2013     Lipid Panel ( most recent) Lipid Panel     Component Value Date/Time   CHOL 176 01/07/2013 0949   TRIG 157 (H) 01/07/2013 0949   HDL 55 01/07/2013 0949   CHOLHDL 3.2 01/07/2013 0949   VLDL 31 01/07/2013 0949   LDLCALC 90 01/07/2013 0949   Results for Constancio, Danea (MRN [id number]) as of 07/03/2017 13:21  Ref. Range 12/22/2015 10:17 01/19/2017 00:00  TSH Latest  Ref Range: 0.41 - 5.90  1.520 3.40  T4,Free(Direct) Latest Ref Range: 0.82 - 1.77 ng/dL 0.91   T3 Uptake Ratio Latest Ref Range: 24 - 39 % 24      Assessment & Plan:   1. Nodular goiter - Destiny Orr  is being seen at a kind request of Martinique, Malka So, MD. - I have reviewed her available thyroid records and clinically evaluated the patient. - Based on my reviews, she has euthyroid nodular goiter.  - Her history is high risk in that she is reporting family history of unidentified thyroid cancer in one of her grandparents and patient describes local symptoms including voice hoarseness, intermittent dysphagia/odynophagia. She is chronic heavy smoker. No pathology per her report from ENT evaluation. - I discussed  options with her including proceeding with fine needle aspiration of the 1.4 cm nodule on the right lobe versus watchful waiting.  - She wishes to proceed with the option of fine needle aspiration.  - This procedure would be scheduled to be done as soon as possible and she will be sent to lab for complete thyroid function tests.  - She will return in 2 weeks to discuss results and treatment if necessary.   - I did not initiate any new prescriptions today. I have counseled her against  smoking.   - I advised patient to maintain close follow up with Martin[place], Betty G, MD for primary care needs. Follow up plan: Return in about 2 weeks (around 07/17/2017) for follow up with biopsy results, labs today.  Glade Lloyd, MD [facility] Phone: [phone]  Fax: [phone]   07/03/2017, 1:15 PM This note was partially dictated with voice recognition software. Similar sounding words can be transcribed inadequately or may not  be corrected upon review.

## 2017-07-04 LAB — THYROID PEROXIDASE ANTIBODY: Thyroperoxidase Ab SerPl-aCnc: 14 IU/mL (ref 0–34)

## 2017-07-04 LAB — THYROGLOBULIN ANTIBODY: Thyroglobulin Antibody: <1 IU/mL (ref 0.0–0.9)

## 2017-07-05 ENCOUNTER — Telehealth: Payer: Self-pay

## 2017-07-05 NOTE — Telephone Encounter (Signed)
Per radiology. Pt will need a new thyroid U/s before we can order a Thyroid biopsy. Ok to order this?

## 2017-07-06 ENCOUNTER — Other Ambulatory Visit: Payer: Self-pay | Admitting: "Endocrinology

## 2017-07-06 DIAGNOSIS — E049 Nontoxic goiter, unspecified: Secondary | ICD-10-CM

## 2017-07-09 LAB — T3, FREE: T3 FREE: 3.6 pg/mL (ref 2.0–4.4)

## 2017-07-10 ENCOUNTER — Ambulatory Visit (INDEPENDENT_AMBULATORY_CARE_PROVIDER_SITE_OTHER): Payer: 59 | Admitting: Psychiatry

## 2017-07-10 ENCOUNTER — Ambulatory Visit (HOSPITAL_COMMUNITY)
Admission: RE | Admit: 2017-07-10 | Discharge: 2017-07-10 | Disposition: A | Discharge status: Home / Self Care | Payer: 59 | Source: Ambulatory Visit | Attending: "Endocrinology | Admitting: "Endocrinology

## 2017-07-10 ENCOUNTER — Encounter (HOSPITAL_COMMUNITY): Payer: Self-pay | Admitting: Psychiatry

## 2017-07-10 DIAGNOSIS — E049 Nontoxic goiter, unspecified: Secondary | ICD-10-CM

## 2017-07-10 DIAGNOSIS — F332 Major depressive disorder, recurrent severe without psychotic features: Secondary | ICD-10-CM
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
## 2017-07-17 ENCOUNTER — Encounter (HOSPITAL_COMMUNITY): Payer: Self-pay | Admitting: Oncology

## 2017-07-17 ENCOUNTER — Encounter (HOSPITAL_COMMUNITY): Payer: 59 | Attending: Oncology | Admitting: Oncology

## 2017-07-17 VITALS — BP 125/82 | HR 126 | Resp 16 | Ht 65.0 in | Wt 182.0 lb

## 2017-07-17 DIAGNOSIS — D72829 Elevated white blood cell count, unspecified: Secondary | ICD-10-CM

## 2017-07-17 NOTE — Progress Notes (Signed)
[facility] Initial Visit:  Patient Care Team: [place], Betty G, MD as PCP - General (Family Medicine)  CHIEF COMPLAINTS/PURPOSE OF CONSULTATION:  Leukocytosis  HISTORY OF PRESENTING ILLNESS: Destiny Orr 36 y.o. female is here because of leukocytosis. Patient lab work done in March 2018 which demonstrated WBC 14.6 K, hemoglobin 13.9 g/dL, hematocrit 41% MCV 90.3, plate count 734L. Repeat CBC on 01/19/2017 demonstrated WBC of 12.7 K. There is no previous evidence of leukocytosis on review of her blood work that we have available in our system. Patient states that she is concerned about underlying leukemia since her maternal uncle was diagnosed with some type of leukemia. She has a history of POTS. She also has severe fatigue and feels like she gets drenched in sweat with any little activity. She denies any chest pain or shortness of breath. She states that she has chronic tachycardia. She has joint pains from arthritis as well as bone spurs. She does not sleep well at night because she feels her heart racing. She is a chronic smoker and smokes 1 ppd for the past 18 years.   INTERVAL HISTORY Patient presents today continued follow-up to review her leukocytosis workup. She continues to have chronic fatigue. She also feels like she has no energy. Her appetite is about 25%. She drinks ensure daily. She has intermittent dizziness. She also has intermittent diarrhea and nausea. She's also been having left upper quadrant abdominal pain that radiates to her back.She has occasional leg swelling. She continues to smoke about 1/2 ppd.  Review of Systems - Oncology ROS as per HPI otherwise 12 point ROS is negative.  MEDICAL HISTORY: Past Medical History:  Diagnosis Date  . Abnormal uterine bleeding (AUB) 12/09/2014  . Anxiety   . Arthritis   . Depression   . DJD (degenerative joint disease)   . Dumping syndrome   . Dysmenorrhea 07/15/2014  . Fatigue 12/24/2012  . Fibromyalgia diagnosed  April 2016  . Headache   . Hx of migraine headaches 12/24/2012  . Inappropriate sinus tachycardia   . Irregular menstrual bleeding 07/15/2014  . Pelvic pain in female 03/03/2016  . Pneumothorax, spontaneous, tension   . Post concussion syndrome   . POTS (postural orthostatic tachycardia syndrome)   . PTSD (post-traumatic stress disorder)   . Scoliosis   . Thyroid disease   . Tick bite 03/03/2016  . Vitamin B 12 deficiency     SURGICAL HISTORY: Past Surgical History:  Procedure Laterality Date  . bone spurs toes Right   . CESAREAN SECTION    . COLONOSCOPY    . endooscopy    . KNEE SURGERY    . rotate cuff lt arm      SOCIAL HISTORY: Social History   Social History  . Marital status: Legally Separated    Spouse name: N/A  . Number of children: N/A  . Years of education: N/A   Occupational History  . Not on file.   Social History Main Topics  . Smoking status: Current Every Day Smoker    Packs/day: 1.00    Years: 15.00    Types: Cigarettes    Start date: 03/13/1997  . Smokeless tobacco: Never Used     Comment: "working on it" Was smoking 2.5 packs a day  . Alcohol use No  . Drug use: No  . Sexual activity: Not Currently    Partners: Male    Birth control/ protection: Injection   Other Topics Concern  . Not on file  Social History Narrative  . No narrative on file    FAMILY HISTORY Family History  Problem Relation Age of Onset  . Hypertension Father   . Atrial fibrillation Father   . Alcohol abuse Father   . Cancer Maternal Grandmother        skin   . Heart disease Maternal Grandmother   . Other Maternal Grandmother        had thyroid removed  . Breast cancer Maternal Grandmother   . Heart disease Paternal Grandfather   . COPD Paternal Grandfather   . Hypertension Paternal Grandfather   . Diabetes Paternal Grandfather   . Stroke Paternal Grandfather   . Anxiety disorder Mother   . Cancer Maternal Grandfather        bladder,lung  . Anxiety  disorder Maternal Aunt   . Anxiety disorder Maternal Uncle   . Bipolar disorder Maternal Uncle   . Breast cancer Maternal Uncle        CML  . Leukemia Other   . Colon cancer Other        lung-2 maternal great uncles    ALLERGIES:  is allergic to prozac [fluoxetine]; topiramate; and lexapro [escitalopram oxalate].  MEDICATIONS:  Current Outpatient Prescriptions  Medication Sig Dispense Refill  . acetaminophen (TYLENOL) 80 MG chewable tablet Chew 80 mg by mouth every 6 (six) hours as needed.    . ALPRAZolam (XANAX) 1 MG tablet Take 1 tablet (1 mg total) by mouth 4 (four) times daily. (Patient taking differently: Take 1 mg by mouth. 5 x daily) 120 tablet 2  . amitriptyline (ELAVIL) 100 MG tablet Take 100 mg by mouth at bedtime.    Marland Kitchen amphetamine-dextroamphetamine (ADDERALL) 10 MG tablet Take 1 tablet (10 mg total) by mouth 2 (two) times daily with a meal. 60 tablet 0  . cyclobenzaprine (FLEXERIL) 5 MG tablet Take 5 mg by mouth 3 (three) times daily as needed for muscle spasms.    Marland Kitchen lamoTRIgine (LAMICTAL) 25 MG tablet Take one daily for 2 weeks, then increase to twice a day 60 tablet 2  . medroxyPROGESTERone (DEPO-PROVERA) 150 MG/ML injection INJECT 1 VIAL INTRAMUSCULARLY EVERY 3 MONTHS IN OFFICE. 1 mL 3  . pindolol (VISKEN) 5 MG tablet Take 0.5 tablets (2.5 mg total) by mouth 2 (two) times daily. (Patient taking differently: Take 2.5 mg by mouth daily. ) 45 tablet 3   No current facility-administered medications for this visit.     PHYSICAL EXAMINATION:  Vitals:   07/17/17 1002  BP: 125/82  Pulse: (!) 126  Resp: 16  SpO2: 98%    Filed Weights   07/17/17 1002  Weight: 182 lb (82.6 kg)     Physical Exam Constitutional: Well-developed, well-nourished, and in no distress.   HENT:  Head: Normocephalic and atraumatic.  Mouth/Throat: No oropharyngeal exudate. Mucosa moist. Eyes: Pupils are equal, round, and reactive to light. Conjunctivae are normal. No scleral icterus.  Neck:  Normal range of motion. Neck supple. No JVD present.  Cardiovascular: Normal rate, regular rhythm and normal heart sounds.  Exam reveals no gallop and no friction rub.   No murmur heard. Pulmonary/Chest: Effort normal and breath sounds normal. No respiratory distress. No wheezes.No rales.  Abdominal: Soft. Bowel sounds are normal. No distension. There is no tenderness. There is no guarding.  Musculoskeletal: No edema or tenderness.  Lymphadenopathy:    No cervical or supraclavicular adenopathy.  Neurological: Alert and oriented to person, place, and time. No cranial nerve deficit.  Skin: Skin is warm and  dry. No rash noted. No erythema. No pallor.  Psychiatric: Depressed mood, judgment normal.   LABORATORY DATA: I have personally reviewed the data as listed:  Hospital Outpatient Visit on 07/03/2017  Component Date Value Ref Range Status  . TSH 07/03/2017 1.763  0.350 - 4.500 uIU/mL Final   Performed by a 3rd Generation assay with a functional sensitivity of <=0.01 uIU/mL.  Marland Kitchen Thyroperoxidase Ab SerPl-aCnc 07/03/2017 14  0 - 34 IU/mL Final   Comment: (NOTE) Performed At: [facility] [place], [place] [id number] Lindon Romp MD [id number]   . T3, Free 07/03/2017 3.6  2.0 - 4.4 pg/mL Final   Comment: (NOTE) Performed At: [facility] [place], [place] [id number] Lindon Romp MD [id number]   . Free T4 07/03/2017 1.00  0.61 - 1.12 ng/dL Final   Comment: (NOTE) Biotin ingestion may interfere with free T4 tests. If the results are inconsistent with the TSH level, previous test results, or the clinical presentation, then consider biotin interference. If needed, order repeat testing after stopping biotin. Performed at [facility], [place] [address], [place], [place] [address]   . Thyroglobulin Antibody 07/03/2017 <1.0  0.0 - 0.9 IU/mL Final   Comment: (NOTE) Thyroglobulin Antibody measured by [facility]  Methodology Performed At: [facility] [place], [place] [id number] Lindon Romp MD [id number]   Lab on 06/18/2017  Component Date Value Ref Range Status  . WBC 06/18/2017 15.5* 4.0 - 10.5 K/uL Final  . RBC 06/18/2017 4.93  3.87 - 5.11 MIL/uL Final  . Hemoglobin 06/18/2017 15.4* 12.0 - 15.0 g/dL Final  . HCT 06/18/2017 44.3  36.0 - 46.0 % Final  . MCV 06/18/2017 89.9  78.0 - 100.0 fL Final  . MCH 06/18/2017 31.2  26.0 - 34.0 pg Final  . MCHC 06/18/2017 34.8  30.0 - 36.0 g/dL Final  . RDW 06/18/2017 12.9  11.5 - 15.5 % Final  . Platelets 06/18/2017 345  150 - 400 K/uL Final  . Neutrophils Relative % 06/18/2017 76  % Final  . Neutro Abs 06/18/2017 11.8* 1.7 - 7.7 K/uL Final  . Lymphocytes Relative 06/18/2017 18  % Final  . Lymphs Abs 06/18/2017 2.9  0.7 - 4.0 K/uL Final  . Monocytes Relative 06/18/2017 5  % Final  . Monocytes Absolute 06/18/2017 0.8  0.1 - 1.0 K/uL Final  . Eosinophils Relative 06/18/2017 1  % Final  . Eosinophils Absolute 06/18/2017 0.1  0.0 - 0.7 K/uL Final  . Basophils Relative 06/18/2017 0  % Final  . Basophils Absolute 06/18/2017 0.0  0.0 - 0.1 K/uL Final  . Sodium 06/18/2017 138  135 - 145 mmol/L Final  . Potassium 06/18/2017 4.4  3.5 - 5.1 mmol/L Final  . Chloride 06/18/2017 104  101 - 111 mmol/L Final  . CO2 06/18/2017 24  22 - 32 mmol/L Final  . Glucose, Bld 06/18/2017 98  65 - 99 mg/dL Final  . BUN 06/18/2017 13  6 - 20 mg/dL Final  . Creatinine, Ser 06/18/2017 0.75  0.44 - 1.00 mg/dL Final  . Calcium 06/18/2017 9.6  8.9 - 10.3 mg/dL Final  . Total Protein 06/18/2017 7.7  6.5 - 8.1 g/dL Final  . Albumin 06/18/2017 4.5  3.5 - 5.0 g/dL Final  . AST 06/18/2017 21  15 - 41 U/L Final  . ALT 06/18/2017 20  14 - 54 U/L Final  . Alkaline Phosphatase 06/18/2017 106  38 - 126 U/L Final  .  Total Bilirubin 06/18/2017 0.4  0.3 - 1.2 mg/dL Final  . GFR calc non Af Amer 06/18/2017 >60  >60 mL/min Final  . GFR calc Af Amer 06/18/2017 >60   >60 mL/min Final   Comment: (NOTE) The eGFR has been calculated using the CKD EPI equation. This calculation has not been validated in all clinical situations. eGFR's persistently <60 mL/min signify possible Chronic Kidney Disease.   . Anion gap 06/18/2017 10  5 - 15 Final  . b2a2 transcript 06/18/2017 Comment  % Final   Comment: (NOTE)           <0.0032 % (sensitivity limit of assay)   . b3a2 transcript 06/18/2017 Comment  % Final   Comment: (NOTE)           <0.0032 % (sensitivity limit of assay)   . E1A2 Transcript 06/18/2017 Comment  % Final   Comment: (NOTE)           <0.0032 % (sensitivity limit of assay)   . Interpretation (BCRAL): 06/18/2017 Comment   Final   Comment: (NOTE) NEGATIVE for the BCR-ABL1 e1a2 (p190), e13a2 (b2a2, p210) and e14a2 (b3a2, p210) fusion transcripts. These results do not rule out the presence of rare BCR-ABL1 transcripts not detected by this assay.   . Director Review [employer]): 06/18/2017 Comment   Final   Comment: (NOTE) Constance Goltz, PhD, [facility]               Director, [place] for [place] and [place], [place]   . Background: 06/18/2017 Comment   Corrected   Comment: (NOTE) This assay can detect three different types of BCR-ABL1 fusion transcripts associated with CML, ALL, and AML: e13a2 (previously b2a2) and e14a2 (previously b3a2) (major breakpoint, p210), as well as e1a2 (minor breakpoint, p190). The e13a2 and e14a2 transcript values are titrated to the current International Scale (IS). The standardized baseline is 100% BCR-ABL1 (IS) and major molecular response (MMR) is equivalent to 0.1% BCR-ABL1 (IS) corresponding to a 3-log reduction. Results should be correlated with appropriate clinical and laboratory information as indicated.   . Methodology 06/18/2017 Comment   Corrected   Comment: (NOTE) Total RNA is  isolated from the sample and subject to a real-time, reverse transcriptase polymerase chain reaction (RT-PCR). The PCR primers and probes are specific for BCR-ABL1 e13a2, e14a2 and e1a2 fusion transcripts. The ABL1 transcript is amplified as the control for cDNA quantity and quality. Serial dilutions of a validated positive control RNA with known t(9;22) BCR-ABL1 are used as reference for quantification of BCR-ABL1 relative to ABL1. The numeric BCR-ABL1 level is reportd as % BCR-ABL1/ABL1 and the detection sensitivity is 4.5 log below the standard baseline. This test was developed and its performance characteristics determined by [facility]. It has not been cleared or approved by the Food and Drug Administration. References:    1. Anastasia Fiedler and Branford S: Seminars in Hematology 2003;       40 (suppl2):62-68.    2. White HE, et al. Blood 2010; 116: e111-117.    3. NCCN Clinical Practice Guidelines in Oncology, Chronic       Myeloid Leukemia. V2. 2017.  Performed At: [facility] [address] [place], [place] [id number] Nechama Guard MD [id number] Performed At: [facility], [place] [id number] Nechama Guard MD [id number]   . JAK2 GenotypR 06/18/2017 Comment   Final   Comment: (NOTE) Result: NEGATIVE for the JAK2 V617F mutation. Interpretation:  The G to T nucleotide change encoding the V617F mutation was not detected.  This result does not rule out the presence of the JAK2 mutation at a level below the sensitivity of detection of this assay, or the presence of other mutations within JAK2 not detected by this assay.  This result does not rule out a diagnosis of polycythemia vera, essential thrombocythemia or idiopathic myelofibrosis as the V617F mutation is not detected in all patients with these disorders.   Marland Kitchen BACKGROUND: 06/18/2017 Comment   Final   Comment: (NOTE) JAK2 is a cytoplasmic tyrosine kinase  with a key role in signal transduction from multiple hematopoietic growth factor receptors. A point mutation within exon 14 of the JAK2 gene ([id number]) encoding a valine to phenylalanine substitution at position 617 of the JAK2 protein (V617F) has been identified in most patients with polycythemia vera, and in about half of those with either essential thrombocythemia or idiopathic myelofibrosis. The V617F has also been detected, although infrequently, in other myeloid disorders such as chronic myelomonocytic leukemia and chronic neutrophilic luekemia. V617F is an acquired mutation that alters a highly conserved valine present in the negative regulatory JH2 domain of the JAK2 protein and is predicted to dysregulate kinase activity. Methodology: Total genomic DNA was extracted and subjected to TaqMan real-time PCR amplification/detection. Two amplification products per sample were monitored by real-time PCR using primers/probes s                          pecific to JAK2 wild type (WT) and JAK2 mutant V617F. The ABI7900 Absolute Quantitation software will compare the patient specimen valuse to the standard curves and generate percent values for wild type and mutant type. In vitro studies have indicated that this assay has an analytical sensitivity of 1%. References: Baxter EJ, Scott Phineas Real, et al. Acquired mutation of the tyrosine kinase JAK2 in human myeloproliferative disorders. Lancet. 2005 Mar 19-25; 365(9464):1054-1061. Alfonso Ramus Couedic JP. A unique clonal JAK2 mutation leading to constitutive signaling causes polycythaemia vera. Nature. 2005 Apr 28; 434(7037):1144-1148. Kralovics R, Passamonti F, Buser AS, et al. A gain-of-function mutation of JAK2 in myeloproliferative disorders. N Engl J Med. 2005 Apr 28; 352(17):1779-1790.   . Director Review, JAK2 06/18/2017 Comment   Final   Comment: (NOTE) Constance Goltz, PhD, [facility]               Director, [place] for [place], [place]               [phone] This test was developed and its performance characteristics determined by [facility]. It has not been cleared or approved by the Food and Drug Administration.   Marland Kitchen REFLEX: 06/18/2017 Comment   Final   Comment: (NOTE) Reflex to CALR Mutation Analysis, JAK2 Exon 12-15 Mutation Analysis, and MPL Mutation Analysis is indicated.   Marland Kitchen  Extraction 06/18/2017 Completed   Corrected   Comment: (NOTE) Performed At: [facility] [address] [place], [place] [id number] Nechama Guard MD [id number] Performed At: [facility] [place], [place] [id number] Nechama Guard MD [id number]   . CALR Mutation Detection Result 06/18/2017 Comment   Final   Comment: (NOTE) NEGATIVE No insertions or deletions were detected within the analyzed region of the calreticulin (CALR) gene. A negative result does not entirely exclude the possibility of a clonal population carrying CALR gene mutations that are not covered by this assay. Results should be interpreted in conjunction with clinical and laboratory findings for the most accurate interpretation.   . Background: 06/18/2017 Comment   Final   Comment: (NOTE) The calcium-binding endoplasmic reticulin chaperone protein, calreticulin (CALR), is somatically mutated in approximately 70% of patients with JAK2-negative essential thrombocythemia (ET) and 60- 88% of patients with JAK2-negative primary myelofibrosis(PMF). Only a minority of patients (approximately 8%) with myelodysplasia have mutations in  CALR gene. CALR mutations are rarely detected in patients with de novo acute myeloid leukemia, chronic myelogenous leukemia, lymphoid leukemia, or solid tumors. CALR mutations are not detected in polycythemia and generally appear to be mutually exclusive with JAK2 mutations and MPL  mutations. The majority of mutational changes involve a variety of insertion or deletion mutations in exon 9 of the calreticulin gene: approximately 53% of all CALR mutations are a 52 bp deletion (type-1) while the second most prevalent mutation (approximately 32%) contains a 5 bp insertion (type-2). Other mutations (non-type 1 or type 2) are seen                           in a small minority of cases. CALR mutations in PMF tend to be associated with a favorable prognosis compared to JAK2 V617F mutations, whereas primary myelofibrosis negative for CALR, JAK2 V617F and MPL mutations (so-called triple negative) is associated with a poor prognosis and shorter survival. The detection of a CALR gene mutation aids in the specific diagnosis of a myeloproliferative neoplasm, and help distinguish this clonal disease from a benign reactive process.   . Methodology: 06/18/2017 Comment   Final   Comment: (NOTE) Genomic DNA was isolated from the provided specimen. Polymerase chain reaction (PCR) of exon 9 of the CALR gene was performed with specific fluorescent-labeled primers, and the PCR product was analyzed by capillary gel electrophoresis to determine the size of the PCR products. This PCR assay is capable of detecting a mutant cell population with a sensitivity of 5 mutant cells per 100 normal cells. A negative result does not exclude the presence of a myeloproliferative disorder or other neoplastic process. This test was developed and its performance characteristics determined by [facility]. It has not been cleared or approved by the Food and Drug Administration. The FDA has determined that such clearance or approval is not necessary.   . References: 06/18/2017 Comment   Final   Comment: (NOTE) 1. Klampfel, T. et al. (2013) Somatic mutations of calreticulin in   myeloproliferative neoplasms. New Engl. J. Med. 048:8891-6945. 2. Haynes Kerns et al. (2013) Somatic CALR mutations in    myeloproliferative neoplasms with nonmutated JAK2. New Engl. J.   Med. [phone].   Marland Kitchen Director Review 06/18/2017 Comment   Final   Comment: (NOTE) Constance Goltz, PhD, [facility]               Director, Molecular Genetics  [facility] for [employer] and [place], [place]   . JAK2 Exons 12-15 Mut Det PCR: 06/18/2017 Comment   Final   Comment: (NOTE) NEGATIVE JAK2 mutations were not detected in exons 12, 13, 14 and 15. This result does not rule out the presence of JAK2 mutation at a level below the detection sensitivity of this assay, the presence of other mutations outside the analyzed region of the JAK2 gene, or the presence of a myeloproliferative or other neoplasm. Result must be correlated with other clinical data for the most accurate diagnosis.   . Indications 06/18/2017 Comment:   Final   NO INDICATION SPECIFIED  . Specimen Type 06/18/2017 Comment   Final   No specimen type provided.  Marland Kitchen BACKGROUND: 06/18/2017 Comment   Final   Comment: (NOTE) JAK2 V617F mutation is detected in patients with polycythemia vera (95%), essential thrombocythemia (50%) and primary myelofibrosis (50%). A small percentage of JAK2 mutation positive patients (3.3%) contain other non-V617F mutations within exons 12 to 15. The detection of a JAK2 gene mutation aids in the specific diagnosis of a myeloproliferative neoplasm, and help distinguish this clonal disease from a benign reactive process.   . Method 06/18/2017 Comment   Final   Comment: (NOTE) Total RNA was purified from the provided specimen. The JAK2 gene region covering exons 12 to 15 was subjected to reverse- transcription coupled PCR amplification, and bi-directional sequencing to identify sequence variations. This assay has a sensitivity to detect approximately 15% population of cells containing the JAK2 mutations in a background of non-mutant  cells. This test was developed and its performance characteristics determined by [facility]. It has not been cleared or approved by the Food and Drug Administration.   . References 06/18/2017 Comment   Final   Comment: (NOTE) Algasham, N. et al. Detection of mutations in JAK2 exons 12-15 by Sanger sequencing. Int J Lab Hemato. 2015, 38:34-41. Joelene Millin al. Mutation profile of JAK2 transcripts in patients with chronic myeloproliferative neoplasias. J Mol Diagn. 2009, 11:49-53.   Marland Kitchen DIRECTOR REVIEW: 06/18/2017 Comment   Final   Comment: (NOTE) Loni Muse, PhD  Director, [place] for [facility] Biology and Pathology  Research [place], [place] [address]  [phone]   . MPL MUTATION ANALYSIS RESULT: 06/18/2017 Comment   Final   Comment: (NOTE) No MPL mutation was identified in the provided specimen of this individual. Results should be interpreted in conjunction with clinical and other laboratory findings for the most accurate interpretation.   Marland Kitchen BACKGROUND: 06/18/2017 Comment   Final   Comment: (NOTE) MPL (myeloproliferative leukemia virus oncogene homology) belongs to the hematopoietin superfamily and enables its ligand thrombopoietin to facilitate both global hematopoiesis and megakaryocyte growth and differentiation. MPL W515 mutations are present in patients with primary myelofibrosis (PMF) and essential thrombocythemia (ET) at a frequency of approximately 5% and 1% respectively. The S505 mutation is detected in patients with hereditary thrombocythemia.   Marland Kitchen METHODOLOGY: 06/18/2017 Comment   Final   Comment: (NOTE) Genomic DNA was purified from the provided specimen. MPL gene region covering the S505N and W515L/K mutations were subjected to PCR amplification and bi-directional sequencing in duplicate to identify sequence variations. This assay has a sensitivity to detect approximately 20-25% population of  cells containing the MPL mutations in a background  of non-mutant cells. This assay will not detect the mutation below the sensitivity of this assay. Molecular- based testing is highly accurate, but as in any laboratory test, rare diagnostic errors may occur.   Marland Kitchen REFERENCES: 06/18/2017 Comment   Final   Comment: (NOTE) 1. Pardanani AD, et al. (2006). MPL515 mutations in   myeloproliferative and other myeloid disorders: a study   of 1182 patients. Blood 794:8016-5537. 2. Andre Lefort and Levine RL. (2008). JAK2 and MPL   mutations in myeloproliferative neoplasms: discovery and   science. Leukemia 22:1813-1817. 3. Juline Patch, et al. (2009). Evidence for a founder effect   of the MPL-S505N mutation in eight [place] pedigrees with   hereditary thrombocythemia. Haematologica 94(10):1368-   4827.   Marland Kitchen DIRECTOR REVIEW: 06/18/2017 Comment   Final   Comment: (NOTE) Loni Muse, PhD  Director, [place] for Molecular Biology and [place], [place] [address]  [phone] This test was developed and its performance characteristics determined by [facility]. It has not been cleared or approved by the Food and Drug Administration. Performed At: [facility] [address] [place], [place] [id number] Nechama Guard MD PQ:[id number] Performed At: [facility] [place], [place] [id number] Nechama Guard MD PJ:[id number]     RADIOGRAPHIC STUDIES: I have personally reviewed the radiological images as listed and agree with the findings in the report   ASSESSMENT/PLAN Mild leukocytosis  PLAN: Reviewed patient's labs with her today. BCR-ABL was negative for CML. JAK2 mutation with reflex to CALR/Exon 12/MPL to rule out underlying myeloproliferative disorder were all negative as well. She may have a reactive leukocytosis from chronic smoking and also inflammation from underlying POTS.  I have discussed with her that if there is evidence of progression of her leukocytosis, we  may potentially consider doing a bone marrow biopsy in the future. For now we will continue observation of her WBC count.  RTC in 4 months for follow up with labs.  Orders Placed This Encounter  Procedures  . CBC with Differential    Standing Status:   Future    Standing Expiration Date:   07/17/2018  . Comprehensive metabolic panel    Standing Status:   Future    Standing Expiration Date:   07/17/2018    All questions were answered. The patient knows to call the clinic with any problems, questions or concerns.  This note was electronically signed.    Twana First, MD  07/17/2017 10:23 AM

## 2017-07-18 ENCOUNTER — Ambulatory Visit: Payer: 59 | Admitting: "Endocrinology

## 2017-07-18 ENCOUNTER — Ambulatory Visit (INDEPENDENT_AMBULATORY_CARE_PROVIDER_SITE_OTHER): Payer: 59 | Admitting: "Endocrinology

## 2017-07-18 ENCOUNTER — Encounter: Payer: Self-pay | Admitting: "Endocrinology

## 2017-07-18 VITALS — BP 121/84 | HR 106 | Ht 65.0 in | Wt 180.0 lb

## 2017-07-18 DIAGNOSIS — E049 Nontoxic goiter, unspecified: Secondary | ICD-10-CM | POA: Diagnosis not present

## 2017-07-18 DIAGNOSIS — R000 Tachycardia, unspecified: Secondary | ICD-10-CM | POA: Diagnosis not present

## 2017-07-18 NOTE — Progress Notes (Signed)
Subjective:    Patient ID: Destiny Orr, female    DOB: 12/26/1980, PCP Martinique, Betty G, MD   Past Medical History:  Diagnosis Date  . Abnormal uterine bleeding (AUB) 12/09/2014  . Anxiety   . Arthritis   . Depression   . DJD (degenerative joint disease)   . Dumping syndrome   . Dysmenorrhea 07/15/2014  . Fatigue 12/24/2012  . Fibromyalgia diagnosed April 2016  . Headache   . Hx of migraine headaches 12/24/2012  . Inappropriate sinus tachycardia   . Irregular menstrual bleeding 07/15/2014  . Pelvic pain in female 03/03/2016  . Pneumothorax, spontaneous, tension   . Post concussion syndrome   . POTS (postural orthostatic tachycardia syndrome)   . PTSD (post-traumatic stress disorder)   . Scoliosis   . Thyroid disease   . Tick bite 03/03/2016  . Vitamin B 12 deficiency    Past Surgical History:  Procedure Laterality Date  . bone spurs toes Right   . CESAREAN SECTION    . COLONOSCOPY    . endooscopy    . KNEE SURGERY    . rotate cuff lt arm     Social History   Social History  . Marital status: Legally Separated    Spouse name: N/A  . Number of children: N/A  . Years of education: N/A   Social History Main Topics  . Smoking status: Current Every Day Smoker    Packs/day: 1.00    Years: 15.00    Types: Cigarettes    Start date: 03/13/1997  . Smokeless tobacco: Never Used     Comment: "working on it" Was smoking 2.5 packs a day  . Alcohol use No  . Drug use: No  . Sexual activity: Not Currently    Partners: Male    Birth control/ protection: Injection   Other Topics Concern  . Not on file   Social History Narrative  . No narrative on file   Outpatient Encounter Prescriptions as of 07/18/2017  Medication Sig  . acetaminophen (TYLENOL) 80 MG chewable tablet Chew 80 mg by mouth every 6 (six) hours as needed.  . ALPRAZolam (XANAX) 1 MG tablet Take 1 tablet (1 mg total) by mouth 4 (four) times daily. (Patient taking differently: Take 1 mg by mouth. 5 x daily)  .  amitriptyline (ELAVIL) 100 MG tablet Take 100 mg by mouth at bedtime.  Marland Kitchen amphetamine-dextroamphetamine (ADDERALL) 10 MG tablet Take 1 tablet (10 mg total) by mouth 2 (two) times daily with a meal.  . cyclobenzaprine (FLEXERIL) 5 MG tablet Take 5 mg by mouth 3 (three) times daily as needed for muscle spasms.  Marland Kitchen lamoTRIgine (LAMICTAL) 25 MG tablet Take one daily for 2 weeks, then increase to twice a day  . medroxyPROGESTERone (DEPO-PROVERA) 150 MG/ML injection INJECT 1 VIAL INTRAMUSCULARLY EVERY 3 MONTHS IN OFFICE.  . pindolol (VISKEN) 5 MG tablet Take 0.5 tablets (2.5 mg total) by mouth 2 (two) times daily. (Patient taking differently: Take 2.5 mg by mouth daily. )   No facility-administered encounter medications on file as of 07/18/2017.    ALLERGIES: Allergies  Allergen Reactions  . Prozac [Fluoxetine]     Suicidal thoughts  . Topiramate Other (See Comments)    Topamax-dizziness  . Lexapro [Escitalopram Oxalate] Other (See Comments)    Flat affect, No emotions    VACCINATION STATUS: Immunization History  Administered Date(s) Administered  . Tdap 02/09/2014    HPI Destiny Orr is 36 y.o. female who presents today with a  medical history as above. she is being seen in  Follow-up with repeat ultrasound after she was seen in consultation for Nodular goiter requested by Martinique, Betty G, MD. - She is known to have nodular goiter at least since 2016 when her ultrasound showed 1.3 cm solid right lobe nodule. She had another thyroid ultrasound in September 2017 which showed right lobe 4.8 cm with 1.4 cm nodule reported to be stable in size and without suspicious features , left lobe 4.3 cm with no nodules. - Her last thyroid function test involved only TSH at 3.4 in April 2018. - She complains of voice change, on and off dysphagia. She reports unidentified thyroid cancer in her grandmother was diagnosed at age 77. Her repeat thyroid ultrasound on 07/11/2017 showed right lobe measuring 4.4 cm with  the previously known nodule stable at 1.5 cm with no suspicious features, left lobe measuring 4.2 cm. The requested biopsy was canceled because of stability of this nodule. - She denies heat/cold intolerance. She denies exposure to neck radiation. Reportedly she was evaluated by ENT with no significant finding to explain her neck symptoms. -She is not currently on antithyroid medications nor on any thyroid supplements.  Review of Systems  Constitutional: no recent major weight change, + fatigue, no subjective hyperthermia, no subjective hypothermia Eyes: no blurry vision, no xerophthalmia ENT: no sore throat, + nodular goiter, ++ intermittent  dysphagia/odynophagia, + hoarseness Cardiovascular: no Chest Pain, no Shortness of Breath, no palpitations, no leg swelling Respiratory: no cough, no SOB Gastrointestinal: no Nausea/Vomiting/Diarhhea Musculoskeletal: no muscle/joint aches Skin: no rashes Neurological: no tremors, no numbness, no tingling, no dizziness Psychiatric: + depression, + anxiety  Objective:    BP 121/84   Pulse (!) 106   Ht 5\' 5"  (1.651 m)   Wt 180 lb (81.6 kg)   BMI 29.95 kg/m   Wt Readings from Last 3 Encounters:  07/18/17 180 lb (81.6 kg)  07/17/17 182 lb (82.6 kg)  07/03/17 174 lb (78.9 kg)    Physical Exam  Constitutional: + Over weight for height, not in acute distress, + dysphoric mood  Eyes: PERRLA, EOMI, no exophthalmos ENT: moist mucous membranes, + palpable  thyromegaly, no cervical lymphadenopathy Cardiovascular: normal precordial activity, + Mild tachycardia at 106, no Murmur/Rubs/Gallops Respiratory:  adequate breathing efforts, no gross chest deformity, Clear to auscultation bilaterally Gastrointestinal: abdomen soft, Non -tender, No distension, Bowel Sounds present Musculoskeletal: no gross deformities, strength intact in all four extremities Skin:  + tattoos, moist, warm, no rashes Neurological: no tremor with outstretched hands, Deep tendon  reflexes normal in all four extremities.  CMP ( most recent) CMP     Component Value Date/Time   NA 138 06/18/2017 1337   NA 141 01/19/2017   K 4.4 06/18/2017 1337   CL 104 06/18/2017 1337   CO2 24 06/18/2017 1337   GLUCOSE 98 06/18/2017 1337   BUN 13 06/18/2017 1337   BUN 13 01/19/2017   CREATININE 0.75 06/18/2017 1337   CREATININE 0.83 01/07/2013 0949   CALCIUM 9.6 06/18/2017 1337   PROT 7.7 06/18/2017 1337   ALBUMIN 4.5 06/18/2017 1337   AST 21 06/18/2017 1337   ALT 20 06/18/2017 1337   ALKPHOS 106 06/18/2017 1337   BILITOT 0.4 06/18/2017 1337   GFRNONAA >60 06/18/2017 1337   GFRAA >60 06/18/2017 1337     Diabetic Labs (most recent): Lab Results  Component Value Date   HGBA1C 5.4 01/07/2013     Lipid Panel ( most recent) Lipid Panel  Component Value Date/Time   CHOL 176 01/07/2013 0949   TRIG 157 (H) 01/07/2013 0949   HDL 55 01/07/2013 0949   CHOLHDL 3.2 01/07/2013 0949   VLDL 31 01/07/2013 0949   LDLCALC 90 01/07/2013 0949   Results for Endya, Austin Emmilia (MRN [id number]) as of 07/18/2017 08:30  Ref. Range 07/03/2017 10:57 07/03/2017 10:58  TSH Latest Ref Range: 0.350 - 4.500 uIU/mL 1.763   Triiodothyronine,Free,Serum Latest Ref Range: 2.0 - 4.4 pg/mL 3.6   T4,Free(Direct) Latest Ref Range: 0.61 - 1.12 ng/dL  1.00  Thyroperoxidase Ab SerPl-aCnc Latest Ref Range: 0 - 34 IU/mL 14   Thyroglobulin Antibody Latest Ref Range: 0.0 - 0.9 IU/mL <1.0   Results for Marianny, Goris Lindaann (MRN [id number]) as of 07/18/2017 11:48  Ref. Range 01/07/2016 16:16  Epinephrine 24 Hr Urine Latest Ref Range: 0 - 20 ug/24 hr 28 (H)  Norepinephrine, 24H Ur Latest Ref Range: 0 - 135 ug/24 hr 73  Dopamine 24 Hr Urine Latest Ref Range: 0 - 510 ug/24 hr 394     Assessment & Plan:   1. Nodular goiter  - She has euthyroid nodular goiter. - Review of her 3 sonograms is consistent with stable nodule on the right lower her thyroid currently at 1.5 cm with no suspicious features. - The requested biopsy  was canceled due to reassuring repeat ultrasound. - Her thyroid function tests are within normal limits. - Her history is high risk in that she is reporting family history of unidentified thyroid cancer in one of her grandparents and patient describes local symptoms including voice hoarseness, intermittent dysphagia/odynophagia. She is chronic heavy smoker. No pathology per her report from ENT evaluation.  - She will be examined again in 1 year with repeat thyroid function tests, and will decide if she needs an ultrasound. - Given her mild tachycardia and slightly elevated 24 hour urine epinephrine in April 2017, I have ordered 24-hour urine for metanephrines and catecholamines. - I did not initiate any new prescriptions today. I have counseled her against smoking.   - I advised patient to maintain close follow up with Martinique, Betty G, MD for primary care needs. Follow up plan: Return in about 1 year (around 07/18/2018) for follow up with pre-visit labs.  Glade Lloyd, MD [facility] [facility] Phone: [phone]  Fax: [phone]   07/18/2017, 9:00 AM This note was partially dictated with voice recognition software. Similar sounding words can be transcribed inadequately or may not  be corrected upon review.

## 2017-07-24 ENCOUNTER — Encounter (HOSPITAL_COMMUNITY): Payer: Self-pay | Admitting: Psychiatry

## 2017-07-24 ENCOUNTER — Ambulatory Visit (INDEPENDENT_AMBULATORY_CARE_PROVIDER_SITE_OTHER): Payer: 59 | Admitting: Psychiatry

## 2017-07-24 DIAGNOSIS — F332 Major depressive disorder, recurrent severe without psychotic features: Secondary | ICD-10-CM | POA: Diagnosis not present
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
## 2017-07-25 ENCOUNTER — Ambulatory Visit (INDEPENDENT_AMBULATORY_CARE_PROVIDER_SITE_OTHER): Payer: 59 | Admitting: Psychiatry

## 2017-07-25 ENCOUNTER — Encounter (HOSPITAL_COMMUNITY): Payer: Self-pay | Admitting: Psychiatry

## 2017-07-25 VITALS — BP 105/68 | HR 117 | Ht 65.0 in | Wt 178.0 lb

## 2017-07-25 DIAGNOSIS — F17210 Nicotine dependence, cigarettes, uncomplicated: Secondary | ICD-10-CM

## 2017-07-25 DIAGNOSIS — Z818 Family history of other mental and behavioral disorders: Secondary | ICD-10-CM

## 2017-07-25 DIAGNOSIS — F332 Major depressive disorder, recurrent severe without psychotic features: Secondary | ICD-10-CM | POA: Diagnosis not present

## 2017-07-25 DIAGNOSIS — R5381 Other malaise: Secondary | ICD-10-CM | POA: Diagnosis not present

## 2017-07-25 DIAGNOSIS — R002 Palpitations: Secondary | ICD-10-CM | POA: Diagnosis not present

## 2017-07-25 DIAGNOSIS — R5383 Other fatigue: Secondary | ICD-10-CM

## 2017-07-25 DIAGNOSIS — R0601 Orthopnea: Secondary | ICD-10-CM | POA: Diagnosis not present

## 2017-07-25 DIAGNOSIS — F419 Anxiety disorder, unspecified: Secondary | ICD-10-CM | POA: Diagnosis not present

## 2017-07-25 DIAGNOSIS — R450 Nervousness: Secondary | ICD-10-CM

## 2017-07-25 DIAGNOSIS — R42 Dizziness and giddiness: Secondary | ICD-10-CM | POA: Diagnosis not present

## 2017-07-25 DIAGNOSIS — Z91411 Personal history of adult psychological abuse: Secondary | ICD-10-CM | POA: Diagnosis not present

## 2017-07-25 MED ORDER — LAMOTRIGINE 25 MG PO TABS: ORAL_TABLET | ORAL | 2 refills | Status: DC

## 2017-07-25 MED ORDER — AMPHETAMINE-DEXTROAMPHETAMINE 10 MG PO TABS: 10.0000 mg | ORAL_TABLET | Freq: Two times a day (BID) | ORAL | 0 refills | Status: DC
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
## 2017-07-26 ENCOUNTER — Ambulatory Visit: Payer: 59 | Admitting: "Endocrinology

## 2017-08-13 ENCOUNTER — Ambulatory Visit (HOSPITAL_COMMUNITY): Payer: Self-pay | Admitting: Psychiatry

## 2017-08-15 ENCOUNTER — Ambulatory Visit (INDEPENDENT_AMBULATORY_CARE_PROVIDER_SITE_OTHER): Payer: 59 | Admitting: Psychiatry

## 2017-08-15 ENCOUNTER — Encounter (HOSPITAL_COMMUNITY): Payer: Self-pay | Admitting: Psychiatry

## 2017-08-15 DIAGNOSIS — F332 Major depressive disorder, recurrent severe without psychotic features: Secondary | ICD-10-CM | POA: Diagnosis not present
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
## 2017-08-20 ENCOUNTER — Ambulatory Visit: Payer: 59 | Admitting: Internal Medicine

## 2017-08-20 ENCOUNTER — Telehealth: Payer: Self-pay | Admitting: Obstetrics & Gynecology

## 2017-08-20 ENCOUNTER — Encounter: Payer: Self-pay | Admitting: Internal Medicine

## 2017-08-20 VITALS — BP 120/78 | HR 84 | Ht 65.0 in | Wt 175.0 lb

## 2017-08-20 DIAGNOSIS — G901 Familial dysautonomia [Riley-Day]: Secondary | ICD-10-CM | POA: Diagnosis not present

## 2017-08-20 NOTE — Progress Notes (Signed)
HPI Ms. Oshana returns today for followup of her autonomic dysfunction. She is a pleasant 36 year old woman with a history of autonomic dysfunction and hyperadrenergic state, who I prescribed low-dose pindolol several weeks ago. She has taken it as needed. Occasionally it will drop her blood pressure too low. She does not mention any frank syncopal episodes. She recently injured her right hand and has not sought medical attention and it remains swollen. Her affect is blunted. Allergies  Allergen Reactions  . Prozac [Fluoxetine]     Suicidal thoughts  . Topiramate Other (See Comments)    Topamax-dizziness  . Lexapro [Escitalopram Oxalate] Other (See Comments)    Flat affect, No emotions     Current Outpatient Medications  Medication Sig Dispense Refill  . acetaminophen (TYLENOL) 80 MG chewable tablet Chew 80 mg by mouth every 6 (six) hours as needed.    . ALPRAZolam (XANAX) 1 MG tablet Take 1 tablet (1 mg total) by mouth 4 (four) times daily. (Patient taking differently: Take 1 mg by mouth. 5 x daily) 120 tablet 2  . amitriptyline (ELAVIL) 100 MG tablet Take 100 mg by mouth at bedtime.    Marland Kitchen amphetamine-dextroamphetamine (ADDERALL) 10 MG tablet Take 1 tablet (10 mg total) by mouth 2 (two) times daily with a meal. 60 tablet 0  . cyclobenzaprine (FLEXERIL) 5 MG tablet Take 5 mg by mouth 3 (three) times daily as needed for muscle spasms.    Marland Kitchen lamoTRIgine (LAMICTAL) 25 MG tablet Take one daily for 2 weeks, then increase to twice a day 60 tablet 2  . medroxyPROGESTERone (DEPO-PROVERA) 150 MG/ML injection INJECT 1 VIAL INTRAMUSCULARLY EVERY 3 MONTHS IN OFFICE. 1 mL 3  . pindolol (VISKEN) 5 MG tablet Take 2.5 mg daily by mouth.     No current facility-administered medications for this visit.      Past Medical History:  Diagnosis Date  . Abnormal uterine bleeding (AUB) 12/09/2014  . Anxiety   . Arthritis   . Depression   . DJD (degenerative joint disease)   . Dumping syndrome   .  Dysmenorrhea 07/15/2014  . Fatigue 12/24/2012  . Fibromyalgia diagnosed April 2016  . Headache   . Hx of migraine headaches 12/24/2012  . Inappropriate sinus tachycardia   . Irregular menstrual bleeding 07/15/2014  . Pelvic pain in female 03/03/2016  . Pneumothorax, spontaneous, tension   . Post concussion syndrome   . POTS (postural orthostatic tachycardia syndrome)   . PTSD (post-traumatic stress disorder)   . Scoliosis   . Thyroid disease   . Tick bite 03/03/2016  . Vitamin B 12 deficiency     ROS:   All systems reviewed and negative except as noted in the HPI.   Past Surgical History:  Procedure Laterality Date  . bone spurs toes Right   . CESAREAN SECTION    . COLONOSCOPY    . endooscopy    . KNEE SURGERY    . rotate cuff lt arm       Family History  Problem Relation Age of Onset  . Hypertension Father   . Atrial fibrillation Father   . Alcohol abuse Father   . Cancer Maternal Grandmother        skin   . Heart disease Maternal Grandmother   . Other Maternal Grandmother        had thyroid removed  . Breast cancer Maternal Grandmother   . Heart disease Paternal Grandfather   . COPD Paternal Grandfather   .  Hypertension Paternal Grandfather   . Diabetes Paternal Grandfather   . Stroke Paternal Grandfather   . Anxiety disorder Mother   . Cancer Maternal Grandfather        bladder,lung  . Anxiety disorder Maternal Aunt   . Anxiety disorder Maternal Uncle   . Bipolar disorder Maternal Uncle   . Breast cancer Maternal Uncle        CML  . Leukemia Other   . Colon cancer Other        lung-2 maternal great uncles     Social History   Socioeconomic History  . Marital status: Legally Separated    Spouse name: Not on file  . Number of children: Not on file  . Years of education: Not on file  . Highest education level: Not on file  Social Needs  . Financial resource strain: Not on file  . Food insecurity - worry: Not on file  . Food insecurity - inability:  Not on file  . Transportation needs - medical: Not on file  . Transportation needs - non-medical: Not on file  Occupational History  . Not on file  Tobacco Use  . Smoking status: Current Every Day Smoker    Packs/day: 1.00    Years: 15.00    Pack years: 15.00    Types: Cigarettes    Start date: 03/13/1997  . Smokeless tobacco: Never Used  . Tobacco comment: "working on it" Was smoking 2.5 packs a day  Substance and Sexual Activity  . Alcohol use: No  . Drug use: No  . Sexual activity: Not Currently    Partners: Male    Birth control/protection: Injection  Other Topics Concern  . Not on file  Social History Narrative  . Not on file     BP 120/78   Pulse 84   Ht 5\' 5"  (1.651 m)   Wt 175 lb (79.4 kg)   SpO2 98% Comment: on room air  BMI 29.12 kg/m   Physical Exam:  Well appearing 36 yo woman, NAD HEENT: Unremarkable Neck:  6 cm JVD, no thyromegally Lymphatics:  No adenopathy Back:  No CVA tenderness Lungs:  Clear, with no wheezes, rales, or rhonchi HEART:  Regular rate rhythm, no murmurs, no rubs, no clicks Abd:  soft, positive bowel sounds, no organomegally, no rebound, no guarding Ext:  2 plus pulses, no edema, no cyanosis, no clubbing Skin:  No rashes no nodules Neuro:  CN II through XII intact, motor grossly intact  Assess/Plan: 1. Autonomic dysfunction - she is tolerating pindolol reasonably well. We discussed the importance of regular daily exercise and maintaining adequate hydration and sodium intake. We discussed avoidance of caffeine and alcohol. I asked the patient to have her hand x-rayed if she doesn't have improvement in the swelling in 3 or 4 days.  Cristopher Peru, M.D.

## 2017-08-20 NOTE — Telephone Encounter (Signed)
Spoke to patient who states she has had heavy bleeding and very intense cramping for 2 weeks. She has been on the depo for years and has had some bleeding but nothing like this. She is scheduled for her Depo injection tomorrow and it within her date of last injection. Requesting to be evaluated. Will schedule with Anderson Malta tomorrow before injection. Pt verbalized understanding.

## 2017-08-20 NOTE — Patient Instructions (Signed)
Medication Instructions:  Your physician recommends that you continue on your current medications as directed. Please refer to the Current Medication list given to you today.   Labwork: NONE  Testing/Procedures: NONE  Follow-Up: Your physician recommends that you schedule a follow-up appointment in: 3 Months with Dr. Taylor.   Any Other Special Instructions Will Be Listed Below (If Applicable).     If you need a refill on your cardiac medications before your next appointment, please call your pharmacy.  Thank you for choosing [facility]!   

## 2017-08-21 ENCOUNTER — Other Ambulatory Visit (HOSPITAL_COMMUNITY)
Admission: RE | Admit: 2017-08-21 | Discharge: 2017-08-21 | Disposition: A | Discharge status: Home / Self Care | Payer: 59 | Source: Ambulatory Visit | Attending: Adult Health | Admitting: Adult Health

## 2017-08-21 ENCOUNTER — Encounter: Payer: Self-pay | Admitting: Adult Health

## 2017-08-21 ENCOUNTER — Ambulatory Visit: Payer: 59 | Admitting: Adult Health

## 2017-08-21 ENCOUNTER — Ambulatory Visit: Payer: 59

## 2017-08-21 VITALS — BP 100/70 | HR 97 | Ht 65.0 in | Wt 175.0 lb

## 2017-08-21 DIAGNOSIS — Z3042 Encounter for surveillance of injectable contraceptive: Secondary | ICD-10-CM | POA: Diagnosis not present

## 2017-08-21 DIAGNOSIS — R109 Unspecified abdominal pain: Secondary | ICD-10-CM | POA: Insufficient documentation

## 2017-08-21 DIAGNOSIS — N939 Abnormal uterine and vaginal bleeding, unspecified: Secondary | ICD-10-CM | POA: Diagnosis present

## 2017-08-21 DIAGNOSIS — Z3202 Encounter for pregnancy test, result negative: Secondary | ICD-10-CM

## 2017-08-21 LAB — CBC
HCT: 43 % (ref 36.0–46.0)
HEMOGLOBIN: 14.3 g/dL (ref 12.0–15.0)
MCH: 30.3 pg (ref 26.0–34.0)
MCHC: 33.3 g/dL (ref 30.0–36.0)
MCV: 91.1 fL (ref 78.0–100.0)
Platelets: 333 10*3/uL (ref 150–400)
RBC: 4.72 MIL/uL (ref 3.87–5.11)
RDW: 12.5 % (ref 11.5–15.5)
WBC: 14.2 10*3/uL — AB (ref 4.0–10.5)

## 2017-08-21 LAB — COMPREHENSIVE METABOLIC PANEL
ALBUMIN: 4.5 g/dL (ref 3.5–5.0)
ALK PHOS: 104 U/L (ref 38–126)
ALT: 32 U/L (ref 14–54)
ANION GAP: 10 (ref 5–15)
AST: 27 U/L (ref 15–41)
BUN: 10 mg/dL (ref 6–20)
CALCIUM: 9.7 mg/dL (ref 8.9–10.3)
CO2: 23 mmol/L (ref 22–32)
CREATININE: 0.78 mg/dL (ref 0.44–1.00)
Chloride: 105 mmol/L (ref 101–111)
GFR calc Af Amer: >60 mL/min (ref >60)
GFR calc non Af Amer: >60 mL/min (ref >60)
GLUCOSE: 113 mg/dL — AB (ref 65–99)
Potassium: 4.1 mmol/L (ref 3.5–5.1)
SODIUM: 138 mmol/L (ref 135–145)
Total Bilirubin: 0.4 mg/dL (ref 0.3–1.2)
Total Protein: 7.7 g/dL (ref 6.5–8.1)

## 2017-08-21 LAB — SEDIMENTATION RATE: Sed Rate: 10 mm/hr (ref 0–22)

## 2017-08-21 LAB — POCT URINE PREGNANCY: PREG TEST UR: NEGATIVE

## 2017-08-21 MED ORDER — DOXYCYCLINE HYCLATE 100 MG PO TABS: 100.0000 mg | ORAL_TABLET | Freq: Two times a day (BID) | ORAL | 0 refills | Status: DC

## 2017-08-21 MED ORDER — MEDROXYPROGESTERONE ACETATE 150 MG/ML IM SUSP
150.0000 mg | Freq: Once | INTRAMUSCULAR | Status: AC
  Administered 2017-08-21: 150 mg via INTRAMUSCULAR

## 2017-08-21 MED ORDER — KETOROLAC TROMETHAMINE 10 MG PO TABS: 10.0000 mg | ORAL_TABLET | Freq: Four times a day (QID) | ORAL | 0 refills | Status: DC | PRN

## 2017-08-21 NOTE — Progress Notes (Signed)
Subjective:     Patient ID: Destiny Orr, female   DOB: 1981-08-03, 36 y.o.   MRN: [id number]  HPI Destiny Orr is a 36 year old white female in complaining of cramping and bleeding for 2 weeks, usually has no bleeding with depo.Has not had sex in over a year. Depo is due today.   Review of Systems +cramping  Bleeding for 2 weeks,but has stopped Reviewed past medical,surgical, social and family history. Reviewed medications and allergies.     Objective:   Physical Exam BP 100/70 (BP Location: Right Arm, Patient Position: Sitting, Cuff Size: Small)   Pulse 97   Ht 5' 5"  (1.651 m)   Wt 175 lb (79.4 kg)   LMP 08/07/2017   BMI 29.12 kg/m UPT negative, Skin warm and dry.Pelvic: external genitalia is normal in appearance no lesions, vagina: scant tan discharge without odor,urethra has no lesions or masses noted, cervix:smooth and bulbous,no CMT, uterus: normal size, shape and contour, mildly tender, no masses felt, adnexa: no masses + tenderness noted,to the left. Bladder is non tender and no masses felt. GC/CHL obtained.    She received depo after exam, by Diona Fanti CMA. PHQ 9 score 21, she is on meds and sees Dr Harrington Challenger and a therapist named Vickii Chafe.  Assessment:     1. Abnormal uterine bleeding (AUB)   2. Abdominal cramping   3. Pregnancy examination or test, negative result   4. Encounter for surveillance of injectable contraceptive       Plan:     Meds ordered this encounter  Medications  . doxycycline (VIBRA-TABS) 100 MG tablet    Sig: Take 1 tablet (100 mg total) by mouth 2 (two) times daily.    Dispense:  20 tablet    Refill:  0    Order Specific Question:   Supervising Provider    Answer:   Elonda Husky, LUTHER H [[id number]]  . ketorolac (TORADOL) 10 MG tablet    Sig: Take 1 tablet (10 mg total) by mouth every 6 (six) hours as needed.    Dispense:  20 tablet    Refill:  0    Order Specific Question:   Supervising Provider    Answer:   Elonda Husky, LUTHER H [[id number]]  . medroxyPROGESTERone (DEPO-PROVERA)  injection 150 mg  Check CBC,CMP and ESR GC/CHL sent Return in 1 week for GYN [place]

## 2017-08-22 ENCOUNTER — Encounter (HOSPITAL_COMMUNITY): Payer: Self-pay | Admitting: Psychiatry

## 2017-08-22 ENCOUNTER — Telehealth: Payer: Self-pay | Admitting: Adult Health

## 2017-08-22 ENCOUNTER — Ambulatory Visit (INDEPENDENT_AMBULATORY_CARE_PROVIDER_SITE_OTHER): Payer: 59 | Admitting: Psychiatry

## 2017-08-22 VITALS — BP 114/78 | HR 125 | Ht 65.0 in | Wt 176.0 lb

## 2017-08-22 DIAGNOSIS — R531 Weakness: Secondary | ICD-10-CM

## 2017-08-22 DIAGNOSIS — R450 Nervousness: Secondary | ICD-10-CM

## 2017-08-22 DIAGNOSIS — F332 Major depressive disorder, recurrent severe without psychotic features: Secondary | ICD-10-CM

## 2017-08-22 DIAGNOSIS — Z736 Limitation of activities due to disability: Secondary | ICD-10-CM

## 2017-08-22 DIAGNOSIS — F17210 Nicotine dependence, cigarettes, uncomplicated: Secondary | ICD-10-CM

## 2017-08-22 DIAGNOSIS — F419 Anxiety disorder, unspecified: Secondary | ICD-10-CM | POA: Diagnosis not present

## 2017-08-22 DIAGNOSIS — G4700 Insomnia, unspecified: Secondary | ICD-10-CM | POA: Diagnosis not present

## 2017-08-22 DIAGNOSIS — Z811 Family history of alcohol abuse and dependence: Secondary | ICD-10-CM | POA: Diagnosis not present

## 2017-08-22 DIAGNOSIS — Z818 Family history of other mental and behavioral disorders: Secondary | ICD-10-CM | POA: Diagnosis not present

## 2017-08-22 MED ORDER — AMPHETAMINE-DEXTROAMPHETAMINE 10 MG PO TABS: 10.0000 mg | ORAL_TABLET | Freq: Two times a day (BID) | ORAL | 0 refills | Status: DC

## 2017-08-22 MED ORDER — ALPRAZOLAM 1 MG PO TABS: 1.0000 mg | ORAL_TABLET | Freq: Four times a day (QID) | ORAL | 2 refills | Status: DC

## 2017-08-22 MED ORDER — LAMOTRIGINE 25 MG PO TABS: ORAL_TABLET | ORAL | 2 refills | Status: DC

## 2017-08-22 NOTE — Telephone Encounter (Signed)
Left message to call me.

## 2017-08-22 NOTE — Telephone Encounter (Signed)
Pt aware of labs and took Toradol last night and no cramping today
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
## 2017-08-23 LAB — GC/CHLAMYDIA PROBE AMP
CHLAMYDIA, DNA PROBE: NEGATIVE
Neisseria gonorrhoeae by PCR: NEGATIVE

## 2017-08-28 ENCOUNTER — Other Ambulatory Visit: Payer: 59

## 2017-08-28 ENCOUNTER — Telehealth (HOSPITAL_COMMUNITY): Payer: Self-pay | Admitting: *Deleted

## 2017-08-28 NOTE — Telephone Encounter (Signed)
Left voice message, provider out of office 08/29/17.

## 2017-08-29 ENCOUNTER — Other Ambulatory Visit: Payer: 59

## 2017-08-29 ENCOUNTER — Ambulatory Visit (HOSPITAL_COMMUNITY): Payer: Self-pay | Admitting: Psychiatry

## 2017-09-12 ENCOUNTER — Encounter (HOSPITAL_COMMUNITY): Payer: Self-pay | Admitting: Psychiatry

## 2017-09-12 ENCOUNTER — Ambulatory Visit (INDEPENDENT_AMBULATORY_CARE_PROVIDER_SITE_OTHER): Payer: 59 | Admitting: Psychiatry

## 2017-09-12 DIAGNOSIS — F332 Major depressive disorder, recurrent severe without psychotic features: Secondary | ICD-10-CM
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
## 2017-09-17 ENCOUNTER — Ambulatory Visit: Payer: Self-pay | Admitting: Internal Medicine

## 2017-10-03 ENCOUNTER — Ambulatory Visit (INDEPENDENT_AMBULATORY_CARE_PROVIDER_SITE_OTHER): Payer: 59 | Admitting: Psychiatry

## 2017-10-03 ENCOUNTER — Encounter (HOSPITAL_COMMUNITY): Payer: Self-pay | Admitting: Psychiatry

## 2017-10-03 VITALS — BP 128/93 | HR 126 | Ht 65.0 in | Wt 165.0 lb

## 2017-10-03 DIAGNOSIS — Z811 Family history of alcohol abuse and dependence: Secondary | ICD-10-CM

## 2017-10-03 DIAGNOSIS — R450 Nervousness: Secondary | ICD-10-CM

## 2017-10-03 DIAGNOSIS — F419 Anxiety disorder, unspecified: Secondary | ICD-10-CM

## 2017-10-03 DIAGNOSIS — F332 Major depressive disorder, recurrent severe without psychotic features: Secondary | ICD-10-CM | POA: Diagnosis not present

## 2017-10-03 DIAGNOSIS — F17210 Nicotine dependence, cigarettes, uncomplicated: Secondary | ICD-10-CM | POA: Diagnosis not present

## 2017-10-03 DIAGNOSIS — Z818 Family history of other mental and behavioral disorders: Secondary | ICD-10-CM

## 2017-10-03 MED ORDER — ALPRAZOLAM 1 MG PO TABS: 1.0000 mg | ORAL_TABLET | Freq: Four times a day (QID) | ORAL | 2 refills | Status: DC

## 2017-10-03 MED ORDER — AMPHETAMINE-DEXTROAMPHETAMINE 10 MG PO TABS: 10.0000 mg | ORAL_TABLET | Freq: Two times a day (BID) | ORAL | 0 refills | Status: DC

## 2017-10-03 MED ORDER — LAMOTRIGINE 25 MG PO TABS: 50.0000 mg | ORAL_TABLET | Freq: Two times a day (BID) | ORAL | 2 refills | Status: DC
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
## 2017-10-11 ENCOUNTER — Telehealth (HOSPITAL_COMMUNITY): Payer: Self-pay | Admitting: *Deleted

## 2017-10-11 ENCOUNTER — Telehealth (HOSPITAL_COMMUNITY): Payer: Self-pay | Admitting: Psychiatry

## 2017-10-11 ENCOUNTER — Encounter (HOSPITAL_COMMUNITY): Payer: Self-pay | Admitting: Psychiatry

## 2017-10-11 ENCOUNTER — Telehealth: Payer: Self-pay | Admitting: Internal Medicine

## 2017-10-11 NOTE — Telephone Encounter (Signed)
New message   Patient calling to request a letter stating what medications she is currently taking and the importance of contining medications. Patient states her employer has canceled her health insurance and she needs this letter for her attorney. Please call

## 2017-10-11 NOTE — Telephone Encounter (Signed)
Dr Harrington Challenger, Patient stated that she was told by Butch Penny that you couldn't send in her work status until you evaluate her in 2 months @ nest visit. And now she has lost her insurance & lawyers waiting on information to expedite claim. She states she doesn't understand why you wouldn't fill out the work status when you have been doing it for a year 1/2 now. And that without insurance she may not be able to keep visit with you &/or pay for medication.

## 2017-10-11 NOTE — Telephone Encounter (Signed)
I filled out the form as usual, do they need something else?

## 2017-10-11 NOTE — Telephone Encounter (Signed)
I did send in the form. Do they need more than that?

## 2017-10-11 NOTE — Telephone Encounter (Signed)
Spoke with patient & CM @ [facility] said she received the doctor's note but the work status wasn't included. Patient will call to have a work status form faxed to [place].

## 2017-10-12 ENCOUNTER — Encounter: Payer: Self-pay | Admitting: *Deleted

## 2017-10-12 NOTE — Telephone Encounter (Signed)
Pt notified that letter is ready for pick up

## 2017-10-12 NOTE — Telephone Encounter (Signed)
Spoke with Destiny Orr.  Per Destiny Orr pindolol is prescribed by Dr. Lovena Le.  Per Dr. Lovena Le ok to write letter stating he prescribes this medication and it is important for Destiny Orr to continue on this medication.  Will send to [facility] office for completion, Destiny Orr needs to pick up in [place].

## 2017-10-17 ENCOUNTER — Encounter (HOSPITAL_COMMUNITY): Payer: Self-pay

## 2017-10-17 ENCOUNTER — Ambulatory Visit (HOSPITAL_COMMUNITY): Payer: Self-pay | Admitting: Psychiatry

## 2017-10-22 ENCOUNTER — Ambulatory Visit (HOSPITAL_COMMUNITY): Payer: Self-pay | Admitting: Psychiatry

## 2017-10-24 ENCOUNTER — Other Ambulatory Visit: Payer: Self-pay | Admitting: Obstetrics & Gynecology

## 2017-10-31 ENCOUNTER — Ambulatory Visit (HOSPITAL_COMMUNITY): Payer: Self-pay | Admitting: Psychiatry

## 2017-11-02 ENCOUNTER — Ambulatory Visit: Payer: Self-pay | Admitting: Internal Medicine

## 2017-11-05 ENCOUNTER — Ambulatory Visit (HOSPITAL_COMMUNITY): Payer: Self-pay | Admitting: Psychiatry

## 2017-11-08 ENCOUNTER — Other Ambulatory Visit (HOSPITAL_COMMUNITY): Payer: Self-pay | Admitting: *Deleted

## 2017-11-08 DIAGNOSIS — D72829 Elevated white blood cell count, unspecified: Secondary | ICD-10-CM

## 2017-11-09 ENCOUNTER — Other Ambulatory Visit (HOSPITAL_COMMUNITY): Payer: Self-pay

## 2017-11-13 ENCOUNTER — Ambulatory Visit: Payer: 59

## 2017-11-16 ENCOUNTER — Ambulatory Visit (HOSPITAL_COMMUNITY): Payer: Self-pay | Admitting: Adult Health

## 2017-11-19 ENCOUNTER — Ambulatory Visit (HOSPITAL_COMMUNITY): Payer: 59 | Admitting: Psychiatry

## 2017-11-21 ENCOUNTER — Ambulatory Visit (HOSPITAL_COMMUNITY): Payer: Self-pay | Admitting: Psychiatry

## 2017-11-30 ENCOUNTER — Encounter (HOSPITAL_COMMUNITY): Payer: Self-pay | Admitting: Psychiatry

## 2017-11-30 ENCOUNTER — Ambulatory Visit (HOSPITAL_COMMUNITY): Payer: 59 | Admitting: Psychiatry

## 2017-11-30 VITALS — BP 107/74 | HR 108 | Ht 65.0 in | Wt 168.0 lb

## 2017-11-30 DIAGNOSIS — F419 Anxiety disorder, unspecified: Secondary | ICD-10-CM

## 2017-11-30 DIAGNOSIS — Z91410 Personal history of adult physical and sexual abuse: Secondary | ICD-10-CM

## 2017-11-30 DIAGNOSIS — Z818 Family history of other mental and behavioral disorders: Secondary | ICD-10-CM | POA: Diagnosis not present

## 2017-11-30 DIAGNOSIS — F332 Major depressive disorder, recurrent severe without psychotic features: Secondary | ICD-10-CM

## 2017-11-30 DIAGNOSIS — Z811 Family history of alcohol abuse and dependence: Secondary | ICD-10-CM | POA: Diagnosis not present

## 2017-11-30 DIAGNOSIS — F17210 Nicotine dependence, cigarettes, uncomplicated: Secondary | ICD-10-CM | POA: Diagnosis not present

## 2017-11-30 DIAGNOSIS — R002 Palpitations: Secondary | ICD-10-CM | POA: Diagnosis not present

## 2017-11-30 DIAGNOSIS — R450 Nervousness: Secondary | ICD-10-CM | POA: Diagnosis not present

## 2017-11-30 MED ORDER — ALPRAZOLAM 1 MG PO TABS: 1.0000 mg | ORAL_TABLET | Freq: Four times a day (QID) | ORAL | 2 refills | Status: DC

## 2017-11-30 MED ORDER — DESVENLAFAXINE SUCCINATE ER 50 MG PO TB24: 50.0000 mg | ORAL_TABLET | ORAL | 3 refills | Status: DC

## 2017-11-30 MED ORDER — AMITRIPTYLINE HCL 100 MG PO TABS: 100.0000 mg | ORAL_TABLET | Freq: Every day | ORAL | 2 refills | Status: DC
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
## 2017-12-04 ENCOUNTER — Telehealth: Payer: Self-pay | Admitting: *Deleted

## 2017-12-04 ENCOUNTER — Other Ambulatory Visit: Payer: Self-pay

## 2017-12-04 MED ORDER — AZITHROMYCIN 250 MG PO TABS: ORAL_TABLET | ORAL | 0 refills | Status: DC

## 2017-12-04 NOTE — Telephone Encounter (Signed)
Daughter has strept throat and she has drank after her and now has fever and sore throat, will Rx Zpack she denies any body aches

## 2017-12-05 ENCOUNTER — Ambulatory Visit: Payer: Self-pay

## 2017-12-05 ENCOUNTER — Other Ambulatory Visit: Payer: Self-pay

## 2017-12-05 ENCOUNTER — Encounter: Payer: Self-pay | Admitting: *Deleted

## 2017-12-05 ENCOUNTER — Other Ambulatory Visit: Payer: 59

## 2017-12-05 ENCOUNTER — Ambulatory Visit (INDEPENDENT_AMBULATORY_CARE_PROVIDER_SITE_OTHER): Payer: 59 | Admitting: *Deleted

## 2017-12-05 DIAGNOSIS — Z3042 Encounter for surveillance of injectable contraceptive: Secondary | ICD-10-CM | POA: Diagnosis not present

## 2017-12-05 LAB — BETA HCG QUANT (REF LAB)

## 2017-12-05 MED ORDER — MEDROXYPROGESTERONE ACETATE 150 MG/ML IM SUSP
150.0000 mg | Freq: Once | INTRAMUSCULAR | Status: AC
  Administered 2017-12-05: 150 mg via INTRAMUSCULAR

## 2017-12-05 NOTE — Progress Notes (Signed)
Pt given DepoProvera 150mg IM right deltoid without complications. Advised to return in 12 weeks for next injection. 

## 2017-12-27 ENCOUNTER — Ambulatory Visit (HOSPITAL_COMMUNITY): Payer: 59 | Admitting: Psychiatry

## 2017-12-27 ENCOUNTER — Encounter (HOSPITAL_COMMUNITY): Payer: Self-pay | Admitting: Psychiatry

## 2017-12-27 VITALS — BP 122/78 | HR 99 | Ht 65.0 in | Wt 172.0 lb

## 2017-12-27 DIAGNOSIS — Z91410 Personal history of adult physical and sexual abuse: Secondary | ICD-10-CM | POA: Diagnosis not present

## 2017-12-27 DIAGNOSIS — F332 Major depressive disorder, recurrent severe without psychotic features: Secondary | ICD-10-CM

## 2017-12-27 DIAGNOSIS — F17210 Nicotine dependence, cigarettes, uncomplicated: Secondary | ICD-10-CM | POA: Diagnosis not present

## 2017-12-27 DIAGNOSIS — Z630 Problems in relationship with spouse or partner: Secondary | ICD-10-CM | POA: Diagnosis not present

## 2017-12-27 DIAGNOSIS — M791 Myalgia, unspecified site: Secondary | ICD-10-CM | POA: Diagnosis not present

## 2017-12-27 DIAGNOSIS — F419 Anxiety disorder, unspecified: Secondary | ICD-10-CM

## 2017-12-27 DIAGNOSIS — Z736 Limitation of activities due to disability: Secondary | ICD-10-CM | POA: Diagnosis not present

## 2017-12-27 MED ORDER — AMITRIPTYLINE HCL 100 MG PO TABS: 100.0000 mg | ORAL_TABLET | Freq: Every day | ORAL | 2 refills | Status: DC

## 2017-12-27 MED ORDER — DESVENLAFAXINE SUCCINATE ER 50 MG PO TB24: 50.0000 mg | ORAL_TABLET | ORAL | 3 refills | Status: DC

## 2017-12-27 MED ORDER — ALPRAZOLAM 1 MG PO TABS: 1.0000 mg | ORAL_TABLET | Freq: Four times a day (QID) | ORAL | 2 refills | Status: DC
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
## 2018-01-01 ENCOUNTER — Telehealth (HOSPITAL_COMMUNITY): Payer: Self-pay | Admitting: *Deleted

## 2018-01-01 NOTE — Telephone Encounter (Signed)
I did it.

## 2018-01-01 NOTE — Telephone Encounter (Signed)
Dr Albertine Patricia called stating they didn't receive the return work status but everything else received in the fax.  And  They are requesting a return to work status including last appointment & next office visit.

## 2018-01-02 ENCOUNTER — Ambulatory Visit: Payer: Self-pay | Admitting: Internal Medicine

## 2018-01-07 ENCOUNTER — Telehealth (HOSPITAL_COMMUNITY): Payer: Self-pay | Admitting: *Deleted

## 2018-01-07 ENCOUNTER — Ambulatory Visit (HOSPITAL_COMMUNITY): Payer: Self-pay | Admitting: Psychiatry

## 2018-01-07 NOTE — Telephone Encounter (Signed)
I sent it in but obviously they didn't receive it.Please have then resend the form

## 2018-01-07 NOTE — Telephone Encounter (Signed)
Dr Albertine Patricia called stating that they received a work status note on office visit 12/03/17. And they are requesting work status on office visit 12/27/17 per Marilu Favre

## 2018-01-24 ENCOUNTER — Ambulatory Visit: Payer: Self-pay | Admitting: Internal Medicine

## 2018-01-28 ENCOUNTER — Telehealth (HOSPITAL_COMMUNITY): Payer: Self-pay | Admitting: *Deleted

## 2018-01-28 NOTE — Telephone Encounter (Signed)
Tell her to stay off it for now and get her in for sooner appt to discuss

## 2018-01-28 NOTE — Telephone Encounter (Signed)
Dr Harrington Challenger  Patient called stating that  She has been taking the Pristiq she has been easily frustrated. In a rage &   easily to get angry quicker . Has appointment to see Peggy tomorrow. Patient stated she has stopped taking   hasn't had in the last 3 days next appointment with you is 02/26/18. Please advise [phone]

## 2018-01-29 ENCOUNTER — Ambulatory Visit (INDEPENDENT_AMBULATORY_CARE_PROVIDER_SITE_OTHER): Payer: Self-pay | Admitting: Psychiatry

## 2018-01-29 ENCOUNTER — Encounter (HOSPITAL_COMMUNITY): Payer: Self-pay | Admitting: Psychiatry

## 2018-01-29 DIAGNOSIS — F332 Major depressive disorder, recurrent severe without psychotic features: Secondary | ICD-10-CM
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
## 2018-01-30 ENCOUNTER — Ambulatory Visit (HOSPITAL_COMMUNITY): Payer: Self-pay | Admitting: Psychiatry

## 2018-02-04 ENCOUNTER — Ambulatory Visit (HOSPITAL_COMMUNITY): Payer: Self-pay | Admitting: Psychiatry

## 2018-02-05 ENCOUNTER — Encounter (HOSPITAL_COMMUNITY): Payer: Self-pay | Admitting: Psychiatry

## 2018-02-05 ENCOUNTER — Ambulatory Visit (INDEPENDENT_AMBULATORY_CARE_PROVIDER_SITE_OTHER): Payer: Self-pay | Admitting: Psychiatry

## 2018-02-05 VITALS — BP 112/78 | HR 84 | Ht 65.0 in | Wt 180.0 lb

## 2018-02-05 DIAGNOSIS — Z811 Family history of alcohol abuse and dependence: Secondary | ICD-10-CM

## 2018-02-05 DIAGNOSIS — F17210 Nicotine dependence, cigarettes, uncomplicated: Secondary | ICD-10-CM

## 2018-02-05 DIAGNOSIS — R450 Nervousness: Secondary | ICD-10-CM

## 2018-02-05 DIAGNOSIS — Z91410 Personal history of adult physical and sexual abuse: Secondary | ICD-10-CM

## 2018-02-05 DIAGNOSIS — F419 Anxiety disorder, unspecified: Secondary | ICD-10-CM

## 2018-02-05 DIAGNOSIS — Z91411 Personal history of adult psychological abuse: Secondary | ICD-10-CM

## 2018-02-05 DIAGNOSIS — Z818 Family history of other mental and behavioral disorders: Secondary | ICD-10-CM

## 2018-02-05 DIAGNOSIS — F332 Major depressive disorder, recurrent severe without psychotic features: Secondary | ICD-10-CM

## 2018-02-05 MED ORDER — ALPRAZOLAM 1 MG PO TABS
1.0000 mg | ORAL_TABLET | Freq: Four times a day (QID) | ORAL | 2 refills | Status: DC
Start: 2018-02-05 — End: 2018-02-26

## 2018-02-05 MED ORDER — AMITRIPTYLINE HCL 100 MG PO TABS: 100.0000 mg | ORAL_TABLET | Freq: Every day | ORAL | 2 refills | Status: DC
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
## 2018-02-26 ENCOUNTER — Other Ambulatory Visit (HOSPITAL_COMMUNITY): Payer: Self-pay | Admitting: Psychiatry

## 2018-02-26 ENCOUNTER — Encounter (HOSPITAL_COMMUNITY): Payer: Self-pay | Admitting: Psychiatry

## 2018-02-26 ENCOUNTER — Telehealth (HOSPITAL_COMMUNITY): Payer: Self-pay | Admitting: *Deleted

## 2018-02-26 ENCOUNTER — Ambulatory Visit (INDEPENDENT_AMBULATORY_CARE_PROVIDER_SITE_OTHER): Payer: Self-pay | Admitting: Psychiatry

## 2018-02-26 VITALS — BP 117/80 | HR 87 | Ht 65.0 in | Wt 178.0 lb

## 2018-02-26 DIAGNOSIS — Z818 Family history of other mental and behavioral disorders: Secondary | ICD-10-CM

## 2018-02-26 DIAGNOSIS — F332 Major depressive disorder, recurrent severe without psychotic features: Secondary | ICD-10-CM

## 2018-02-26 DIAGNOSIS — R450 Nervousness: Secondary | ICD-10-CM

## 2018-02-26 DIAGNOSIS — F419 Anxiety disorder, unspecified: Secondary | ICD-10-CM

## 2018-02-26 DIAGNOSIS — Z91411 Personal history of adult psychological abuse: Secondary | ICD-10-CM

## 2018-02-26 DIAGNOSIS — F17210 Nicotine dependence, cigarettes, uncomplicated: Secondary | ICD-10-CM

## 2018-02-26 DIAGNOSIS — F4010 Social phobia, unspecified: Secondary | ICD-10-CM

## 2018-02-26 DIAGNOSIS — Z811 Family history of alcohol abuse and dependence: Secondary | ICD-10-CM

## 2018-02-26 DIAGNOSIS — M2550 Pain in unspecified joint: Secondary | ICD-10-CM

## 2018-02-26 MED ORDER — DIVALPROEX SODIUM ER 250 MG PO TB24: ORAL_TABLET | ORAL | 2 refills | Status: DC

## 2018-02-26 MED ORDER — CARBAMAZEPINE 200 MG PO TABS: ORAL_TABLET | ORAL | 2 refills | Status: DC

## 2018-02-26 MED ORDER — ALPRAZOLAM 1 MG PO TABS: 1.0000 mg | ORAL_TABLET | Freq: Four times a day (QID) | ORAL | 2 refills | Status: DC

## 2018-02-26 MED ORDER — AMITRIPTYLINE HCL 100 MG PO TABS: 100.0000 mg | ORAL_TABLET | Freq: Every day | ORAL | 2 refills | Status: DC
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
## 2018-02-26 NOTE — Telephone Encounter (Signed)
Dr Harrington Challenger Per Patient she spoke  With RX & Tegretol will cost out of pocket $58.00. And she she has no insurance   and was informed that the Depakote would cost $22.00. RX told her that they hold filling script until she   spoke with you about changing script.

## 2018-02-26 NOTE — Telephone Encounter (Signed)
Depakote sent in, please tell her

## 2018-02-26 NOTE — Telephone Encounter (Signed)
NOTED & DONE

## 2018-02-27 ENCOUNTER — Ambulatory Visit: Payer: 59

## 2018-03-04 ENCOUNTER — Telehealth: Payer: Self-pay | Admitting: Neurology
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
## 2018-03-06 ENCOUNTER — Telehealth (HOSPITAL_COMMUNITY): Payer: Self-pay | Admitting: *Deleted

## 2018-03-06 NOTE — Telephone Encounter (Signed)
Records requested

## 2018-03-06 NOTE — Telephone Encounter (Signed)
Yes that will be fine. 

## 2018-03-06 NOTE — Telephone Encounter (Signed)
Spoke with pt who states that she is still experiencing shadows in her L eye when reading.  States that she closes her L eye when reading, especially smaller print.  Describes a halo, to the left of objects that she looks at with both eyes.  States that her optometrist says there is nothing wrong with her eyes and that she needs to see her neurologist.  Pt states that she no longer has insurance and is going to a hearing regarding disability next week.  Advised I would request records from optometrist and will schedule appointment if Dr. Delice Lesch feels she needs to be seen in the office.

## 2018-03-06 NOTE — Telephone Encounter (Signed)
Dr Harrington Challenger  Patient noted that she can tell a tremendous difference since starting the Depakote. She did state that during the day between the hrs of 2-4 pm especially @ 3 pm she is  becomes very irritable , very moody & "snappy" (her word). She asked if she could take 1  Depakote in the Am & 1 @ bedtime along with the 1 Amitriptyline?

## 2018-04-01 ENCOUNTER — Ambulatory Visit (HOSPITAL_COMMUNITY): Payer: Self-pay | Admitting: Psychiatry

## 2018-04-11 ENCOUNTER — Encounter (HOSPITAL_COMMUNITY): Payer: Self-pay | Admitting: Psychiatry

## 2018-04-11 ENCOUNTER — Ambulatory Visit (INDEPENDENT_AMBULATORY_CARE_PROVIDER_SITE_OTHER): Payer: Self-pay | Admitting: Psychiatry

## 2018-04-11 VITALS — BP 117/89 | HR 103 | Ht 65.0 in | Wt 181.0 lb

## 2018-04-11 DIAGNOSIS — F332 Major depressive disorder, recurrent severe without psychotic features: Secondary | ICD-10-CM

## 2018-04-11 MED ORDER — AMITRIPTYLINE HCL 100 MG PO TABS: 100.0000 mg | ORAL_TABLET | Freq: Every day | ORAL | 2 refills | Status: DC

## 2018-04-11 MED ORDER — ALPRAZOLAM 1 MG PO TABS: 1.0000 mg | ORAL_TABLET | Freq: Four times a day (QID) | ORAL | 2 refills | Status: DC

## 2018-04-11 MED ORDER — DIVALPROEX SODIUM ER 500 MG PO TB24: 500.0000 mg | ORAL_TABLET | Freq: Every day | ORAL | 2 refills | Status: DC
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
## 2018-05-13 ENCOUNTER — Ambulatory Visit (INDEPENDENT_AMBULATORY_CARE_PROVIDER_SITE_OTHER): Payer: Self-pay | Admitting: Psychiatry

## 2018-05-13 ENCOUNTER — Encounter (HOSPITAL_COMMUNITY): Payer: Self-pay | Admitting: Psychiatry

## 2018-05-13 VITALS — BP 107/75 | HR 106 | Ht 65.0 in | Wt 180.0 lb

## 2018-05-13 DIAGNOSIS — R450 Nervousness: Secondary | ICD-10-CM

## 2018-05-13 DIAGNOSIS — Z818 Family history of other mental and behavioral disorders: Secondary | ICD-10-CM

## 2018-05-13 DIAGNOSIS — F17210 Nicotine dependence, cigarettes, uncomplicated: Secondary | ICD-10-CM

## 2018-05-13 DIAGNOSIS — G4700 Insomnia, unspecified: Secondary | ICD-10-CM

## 2018-05-13 DIAGNOSIS — F332 Major depressive disorder, recurrent severe without psychotic features: Secondary | ICD-10-CM

## 2018-05-13 DIAGNOSIS — Z811 Family history of alcohol abuse and dependence: Secondary | ICD-10-CM

## 2018-05-13 MED ORDER — AMITRIPTYLINE HCL 100 MG PO TABS: 100.0000 mg | ORAL_TABLET | Freq: Every day | ORAL | 2 refills | Status: DC

## 2018-05-13 MED ORDER — ALPRAZOLAM 1 MG PO TABS: 1.0000 mg | ORAL_TABLET | Freq: Four times a day (QID) | ORAL | 2 refills | Status: DC

## 2018-05-13 MED ORDER — TEMAZEPAM 15 MG PO CAPS: 15.0000 mg | ORAL_CAPSULE | Freq: Every evening | ORAL | 2 refills | Status: DC | PRN
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
## 2018-06-25 ENCOUNTER — Ambulatory Visit (HOSPITAL_COMMUNITY): Payer: Self-pay | Admitting: Psychiatry

## 2018-07-04 ENCOUNTER — Ambulatory Visit (INDEPENDENT_AMBULATORY_CARE_PROVIDER_SITE_OTHER): Payer: Medicare Other | Admitting: Psychiatry

## 2018-07-04 ENCOUNTER — Encounter (HOSPITAL_COMMUNITY): Payer: Self-pay | Admitting: Psychiatry

## 2018-07-04 VITALS — BP 120/86 | HR 93 | Ht 65.0 in | Wt 181.0 lb

## 2018-07-04 DIAGNOSIS — G4700 Insomnia, unspecified: Secondary | ICD-10-CM | POA: Diagnosis not present

## 2018-07-04 DIAGNOSIS — F419 Anxiety disorder, unspecified: Secondary | ICD-10-CM | POA: Diagnosis not present

## 2018-07-04 DIAGNOSIS — F332 Major depressive disorder, recurrent severe without psychotic features: Secondary | ICD-10-CM

## 2018-07-04 MED ORDER — ALPRAZOLAM 1 MG PO TABS: 1.0000 mg | ORAL_TABLET | Freq: Four times a day (QID) | ORAL | 2 refills | Status: DC

## 2018-07-04 MED ORDER — AMITRIPTYLINE HCL 100 MG PO TABS: 100.0000 mg | ORAL_TABLET | Freq: Every day | ORAL | 2 refills | Status: DC

## 2018-07-04 MED ORDER — TEMAZEPAM 15 MG PO CAPS: 15.0000 mg | ORAL_CAPSULE | Freq: Every evening | ORAL | 2 refills | Status: DC | PRN

## 2018-07-04 MED ORDER — BUPROPION HCL ER (XL) 150 MG PO TB24: 150.0000 mg | ORAL_TABLET | ORAL | 2 refills | Status: DC
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
## 2018-07-09 ENCOUNTER — Telehealth (HOSPITAL_COMMUNITY): Payer: Self-pay | Admitting: *Deleted

## 2018-07-09 NOTE — Telephone Encounter (Signed)
Patient called to check on refill status . [place] -- Sunday evening 10/13

## 2018-07-10 ENCOUNTER — Other Ambulatory Visit (HOSPITAL_COMMUNITY): Payer: Self-pay | Admitting: Psychiatry

## 2018-07-11 ENCOUNTER — Encounter (HOSPITAL_COMMUNITY): Payer: Self-pay

## 2018-07-11 ENCOUNTER — Emergency Department (HOSPITAL_COMMUNITY)
Admission: EM | Admit: 2018-07-11 | Discharge: 2018-07-11 | Disposition: A | Discharge status: Home / Self Care | Payer: Medicare Other | Attending: Emergency Medicine | Admitting: Emergency Medicine

## 2018-07-11 ENCOUNTER — Emergency Department (HOSPITAL_COMMUNITY): Payer: Medicare Other

## 2018-07-11 ENCOUNTER — Other Ambulatory Visit: Payer: Self-pay

## 2018-07-11 DIAGNOSIS — F17210 Nicotine dependence, cigarettes, uncomplicated: Secondary | ICD-10-CM | POA: Insufficient documentation

## 2018-07-11 DIAGNOSIS — R0602 Shortness of breath: Secondary | ICD-10-CM | POA: Diagnosis not present

## 2018-07-11 DIAGNOSIS — R079 Chest pain, unspecified: Secondary | ICD-10-CM | POA: Diagnosis not present

## 2018-07-11 DIAGNOSIS — E079 Disorder of thyroid, unspecified: Secondary | ICD-10-CM | POA: Insufficient documentation

## 2018-07-11 DIAGNOSIS — R0789 Other chest pain: Secondary | ICD-10-CM | POA: Diagnosis not present

## 2018-07-11 LAB — BASIC METABOLIC PANEL
Anion gap: 9 (ref 5–15)
BUN: 10 mg/dL (ref 6–20)
CO2: 26 mmol/L (ref 22–32)
CREATININE: 0.79 mg/dL (ref 0.44–1.00)
Calcium: 9.2 mg/dL (ref 8.9–10.3)
Chloride: 103 mmol/L (ref 98–111)
GFR calc Af Amer: >60 mL/min (ref >60)
GLUCOSE: 106 mg/dL — AB (ref 70–99)
POTASSIUM: 4.3 mmol/L (ref 3.5–5.1)
SODIUM: 138 mmol/L (ref 135–145)

## 2018-07-11 LAB — CBC
HCT: 43.8 % (ref 36.0–46.0)
HEMOGLOBIN: 14.2 g/dL (ref 12.0–15.0)
MCH: 30.3 pg (ref 26.0–34.0)
MCHC: 32.4 g/dL (ref 30.0–36.0)
MCV: 93.4 fL (ref 80.0–100.0)
Platelets: 358 10*3/uL (ref 150–400)
RBC: 4.69 MIL/uL (ref 3.87–5.11)
RDW: 12.7 % (ref 11.5–15.5)
WBC: 16.7 10*3/uL — AB (ref 4.0–10.5)
nRBC: 0 % (ref 0.0–0.2)

## 2018-07-11 LAB — TROPONIN I

## 2018-07-11 LAB — D-DIMER, QUANTITATIVE: D-Dimer, Quant: 0.32 ug/mL-FEU (ref 0.00–0.50)

## 2018-07-11 LAB — HCG, QUANTITATIVE, PREGNANCY: hCG, Beta Chain, Quant, S: <1 m[IU]/mL (ref <5)

## 2018-07-11 MED ORDER — KETOROLAC TROMETHAMINE 30 MG/ML IJ SOLN
30.0000 mg | Freq: Once | INTRAMUSCULAR | Status: AC
  Administered 2018-07-11: 30 mg via INTRAVENOUS
  Filled 2018-07-11: qty 1

## 2018-07-11 MED ORDER — ALBUTEROL SULFATE HFA 108 (90 BASE) MCG/ACT IN AERS
2.0000 | INHALATION_SPRAY | Freq: Once | RESPIRATORY_TRACT | Status: AC
  Administered 2018-07-11: 2 via RESPIRATORY_TRACT
  Filled 2018-07-11: qty 6.7

## 2018-07-11 MED ORDER — DIAZEPAM 2 MG PO TABS
2.0000 mg | ORAL_TABLET | Freq: Once | ORAL | Status: AC
  Administered 2018-07-11: 2 mg via ORAL
  Filled 2018-07-11: qty 1

## 2018-07-11 MED ORDER — DIAZEPAM 5 MG PO TABS: 2.5000 mg | ORAL_TABLET | Freq: Four times a day (QID) | ORAL | 0 refills | Status: DC | PRN

## 2018-07-11 NOTE — ED Triage Notes (Signed)
Pt has been working out for the last few weeks. Thought she had muscle pain due to that. Yesterday started having right sided shoulder pain that originated in her back. Woke up now, pain has radiated to her central chest. Went to urgent care to be seen, but was not able to be seen due to insurance

## 2018-07-11 NOTE — ED Provider Notes (Signed)
Emergency Department Provider Note   I have reviewed the triage vital signs and the nursing notes.   HISTORY  Chief Complaint Chest Pain   HPI Destiny Orr is a 37 y.o. female had bronchitis that consists of cough and mild shortness of breath until about 5 or 4 days ago but then since then she is been having some back pain in between her shoulders and seems to radiate towards her chest in the last couple days and been associated shortness of breath.  She presents here for evaluation of same.  No fever.  She does smoke but not a large amount.  She does have a PCP at this time and that is why she presents here.  She thinks her left lower extremity is more swollen than her right that is new.  No recent travels or surgeries.  No history of blood clots.  No cardiac history. No other associated or modifying symptoms.    Past Medical History:  Diagnosis Date  . Abnormal uterine bleeding (AUB) 12/09/2014  . Anxiety   . Arthritis   . Depression   . DJD (degenerative joint disease)   . Dumping syndrome   . Dysmenorrhea 07/15/2014  . Fatigue 12/24/2012  . Fibromyalgia diagnosed April 2016  . Headache   . Hx of migraine headaches 12/24/2012  . Inappropriate sinus tachycardia   . Irregular menstrual bleeding 07/15/2014  . Pelvic pain in female 03/03/2016  . Pneumothorax, spontaneous, tension   . Post concussion syndrome   . POTS (postural orthostatic tachycardia syndrome)   . PTSD (post-traumatic stress disorder)   . Scoliosis   . Thyroid disease   . Tick bite 03/03/2016  . Vitamin B 12 deficiency     Patient Active Problem List   Diagnosis Date Noted  . Current smoker 07/03/2017  . Leukocytosis 06/18/2017  . Neck injury, initial encounter 10/20/2016  . Concussion with loss of consciousness 10/20/2016  . Injury of left shoulder 10/20/2016  . Sinus tachycardia 10/16/2016  . Post concussion syndrome 10/03/2016  . Intractable headache 10/03/2016  . Dizziness 10/03/2016  . Blurry  vision, right eye 10/03/2016  . Nodular goiter 06/18/2016  . Fibromyalgia syndrome 06/16/2016  . Raynaud's syndrome without gangrene 06/16/2016  . Numbness and tingling sensation of skin 06/16/2016  . B12 deficiency 06/16/2016  . Dizziness and giddiness 06/16/2016  . Pelvic pain in female 03/03/2016  . Tick bite 03/03/2016  . POTS (postural orthostatic tachycardia syndrome) 02/05/2016  . Abnormal uterine bleeding (AUB) 12/09/2014  . Depression 11/26/2014  . Dysmenorrhea 07/15/2014  . Irregular menstrual bleeding 07/15/2014  . Back pain, chronic 04/02/2013  . Chronic fatigue and malaise 12/24/2012  . Anxiety 12/24/2012  . PTSD (post-traumatic stress disorder) 12/24/2012  . ADD (attention deficit disorder) 12/24/2012  . Contraception 12/24/2012  . Hx of migraine headaches 12/24/2012    Past Surgical History:  Procedure Laterality Date  . bone spurs toes Right   . CESAREAN SECTION    . COLONOSCOPY    . endooscopy    . KNEE SURGERY    . rotate cuff lt arm      Current Outpatient Rx  . Order #: [id number] Class: Normal  . Order #: [id number] Class: Normal  . Order #: [id number] Class: Normal  . Order #: [id number] Class: Historical Med  . Order #: [id number] Class: Historical Med  . Order #: [id number] Class: Historical Med  . Order #: [id number] Class: Normal  . Order #: [id number] Class: Normal  . Order #: [id number] Class: Normal  . Order #: [id number] Class: Print  Allergies Prozac [fluoxetine]; Topiramate; and Lexapro [escitalopram oxalate]  Family History  Problem Relation Age of Onset  . Hypertension Father   . Atrial fibrillation Father   . Alcohol abuse Father   . Cancer Maternal Grandmother        skin   . Heart disease Maternal Grandmother   . Other Maternal Grandmother        had thyroid removed  . Breast cancer Maternal Grandmother   . Heart disease Paternal Grandfather   . COPD Paternal Grandfather   . Hypertension Paternal Grandfather   . Diabetes Paternal  Grandfather   . Stroke Paternal Grandfather   . Cancer Maternal Grandfather        bladder,lung  . Anxiety disorder Maternal Aunt   . Anxiety disorder Maternal Uncle   . Bipolar disorder Maternal Uncle   . Breast cancer Maternal Uncle        CML  . Leukemia Other   . Colon cancer Other        lung-2 maternal great uncles    Social History Social History   Tobacco Use  . Smoking status: Current Every Day Smoker    Packs/day: 1.00    Years: 15.00    Pack years: 15.00    Types: Cigarettes    Start date: 03/13/1997  . Smokeless tobacco: Never Used  . Tobacco comment: "working on it" Was smoking 2.5 packs a day  Substance Use Topics  . Alcohol use: No  . Drug use: No    Review of Systems  All other systems negative except as documented in the HPI. All pertinent positives and negatives as reviewed in the HPI. ____________________________________________   PHYSICAL EXAM:  VITAL SIGNS: ED Triage Vitals  Enc Vitals Group     BP 07/11/18 1340 123/74     Pulse Rate 07/11/18 1340 (!) 108     Resp 07/11/18 1340 16     Temp 07/11/18 1340 98.1 F (36.7 C)     Temp Source 07/11/18 1340 Oral     SpO2 07/11/18 1340 99 %     Weight 07/11/18 1341 170 lb (77.1 kg)     Height 07/11/18 1341 5' 4.5" (1.638 m)    Constitutional: Alert and oriented. Well appearing and in no acute distress. Eyes: Conjunctivae are normal. PERRL. EOMI. Head: Atraumatic. Nose: No congestion/rhinnorhea. Mouth/Throat: Mucous membranes are moist.  Oropharynx non-erythematous. Neck: No stridor.  No meningeal signs.   Cardiovascular: tachycardic rate, regular rhythm. Good peripheral circulation. Grossly normal heart sounds.   Respiratory: Normal respiratory effort.  No retractions. Lungs CTAB. Gastrointestinal: Soft and nontender. No distention.  Musculoskeletal: No lower extremity tenderness nor edema. No gross deformities of extremities. Neurologic:  Normal speech and language. No gross focal neurologic  deficits are appreciated.  Skin:  Skin is warm, dry and intact. No rash noted.   ____________________________________________   LABS (all labs ordered are listed, but only abnormal results are displayed)  Labs Reviewed  BASIC METABOLIC PANEL - Abnormal; Notable for the following components:      Result Value   Glucose, Bld 106 (*)    All other components within normal limits  CBC - Abnormal; Notable for the following components:   WBC 16.7 (*)    All other components within normal limits  TROPONIN I  HCG, QUANTITATIVE, PREGNANCY  D-DIMER, QUANTITATIVE (NOT AT [facility])   ____________________________________________  EKG   EKG Interpretation  Date/Time:  Thursday July 11 2018 13:44:03 EDT Ventricular Rate:  106 PR Interval:  130 QRS Duration: 70 QT Interval:  340 QTC Calculation: 451 R Axis:   87 Text Interpretation:  Sinus tachycardia Otherwise normal ECG No significant change since last tracing in july 2015 Confirmed by Merrily Pew [phone]) on 07/11/2018 2:38:08 PM       ____________________________________________  RADIOLOGY  Dg Chest 2 View  Result Date: 07/11/2018 CLINICAL DATA:  Acute chest pain for 1 day. EXAM: CHEST - 2 VIEW COMPARISON:  12/17/2019 and prior radiographs FINDINGS: The cardiomediastinal silhouette is unremarkable. There is no evidence of focal airspace disease, pulmonary edema, suspicious pulmonary nodule/mass, pleural effusion, or pneumothorax. No acute bony abnormalities are identified. IMPRESSION: No active cardiopulmonary disease. Electronically Signed   By: Margarette Canada M.D.   On: 07/11/2018 14:16    ____________________________________________   INITIAL IMPRESSION / ASSESSMENT AND PLAN / ED COURSE  Work-up relatively unremarkable.  D-dimer negative making blood clot unlikely.  Patient does have any risk factors and atypical story making ACS unlikely.  She has normal pulses making me less suspicion for dissection and she is not  hypertensive either.  Could be just sequelae from her bronchitis.  Her heart rate still elevated but on multiple visits in the past has been anywhere from as low as 81 but generally in the low 100s all the way up to 120s and asymptomatic.  This is apparently an ongoing problem for her.  At this point I suspect this is just muscular versus resulted from her cough.  I think discharge with muscle relaxers, NSAIDs and outpatient follow-up as appropriate.     Pertinent labs & imaging results that were available during my care of the patient were reviewed by me and considered in my medical decision making (see chart for details).  ____________________________________________  FINAL CLINICAL IMPRESSION(S) / ED DIAGNOSES  Final diagnoses:  Nonspecific chest pain     MEDICATIONS GIVEN DURING THIS VISIT:  Medications  ketorolac (TORADOL) 30 MG/ML injection 30 mg (30 mg Intravenous Given 07/11/18 1521)  diazepam (VALIUM) tablet 2 mg (2 mg Oral Given 07/11/18 1521)  albuterol (PROVENTIL HFA;VENTOLIN HFA) 108 (90 Base) MCG/ACT inhaler 2 puff (2 puffs Inhalation Given 07/11/18 1611)     NEW OUTPATIENT MEDICATIONS STARTED DURING THIS VISIT:  Discharge Medication List as of 07/11/2018  5:21 PM    START taking these medications   Details  diazepam (VALIUM) 5 MG tablet Take 0.5 tablets (2.5 mg total) by mouth every 6 (six) hours as needed (spasms)., Starting Thu 07/11/2018, Print        Note:  This note was prepared with assistance of Dragon voice recognition software. Occasional wrong-word or sound-a-like substitutions may have occurred due to the inherent limitations of voice recognition software.   Merrily Pew, MD 07/11/18 [phone]

## 2018-07-15 ENCOUNTER — Ambulatory Visit: Payer: Self-pay | Admitting: Obstetrics & Gynecology

## 2018-07-18 ENCOUNTER — Encounter: Payer: Self-pay | Admitting: "Endocrinology

## 2018-07-18 ENCOUNTER — Ambulatory Visit: Payer: 59 | Admitting: "Endocrinology

## 2018-07-22 ENCOUNTER — Ambulatory Visit: Payer: Self-pay | Admitting: Obstetrics & Gynecology

## 2018-07-26 ENCOUNTER — Other Ambulatory Visit: Payer: Self-pay

## 2018-07-26 ENCOUNTER — Ambulatory Visit: Payer: Medicare Other | Admitting: Obstetrics & Gynecology

## 2018-07-26 ENCOUNTER — Encounter: Payer: Self-pay | Admitting: Obstetrics & Gynecology

## 2018-07-26 VITALS — BP 117/80 | HR 93 | Ht 64.5 in | Wt 179.0 lb

## 2018-07-26 DIAGNOSIS — N911 Secondary amenorrhea: Secondary | ICD-10-CM

## 2018-07-26 MED ORDER — MEDROXYPROGESTERONE ACETATE 10 MG PO TABS: 10.0000 mg | ORAL_TABLET | Freq: Every day | ORAL | 1 refills | Status: DC

## 2018-07-26 MED ORDER — KETOROLAC TROMETHAMINE 10 MG PO TABS: 10.0000 mg | ORAL_TABLET | Freq: Three times a day (TID) | ORAL | 0 refills | Status: DC | PRN

## 2018-07-26 NOTE — Progress Notes (Signed)
Chief Complaint  Patient presents with  . Advice Only    discuss depo      37 y.o. [id number] No LMP recorded. Patient has had an injection. The current method of family planning is none.  Outpatient Encounter Medications as of 07/26/2018  Medication Sig Note  . ALPRAZolam (XANAX) 1 MG tablet Take 1 tablet (1 mg total) by mouth 4 (four) times daily.   Marland Kitchen amitriptyline (ELAVIL) 100 MG tablet Take 1 tablet (100 mg total) by mouth at bedtime. (Patient taking differently: Take 100 mg by mouth at bedtime as needed for sleep. )   . buPROPion (WELLBUTRIN XL) 150 MG 24 hr tablet Take 1 tablet (150 mg total) by mouth every morning.   . Cyanocobalamin (B-12 PO) Take 1 tablet by mouth daily.   . cyclobenzaprine (FLEXERIL) 5 MG tablet Take 5 mg by mouth 3 (three) times daily as needed for muscle spasms.   Marland Kitchen ibuprofen (ADVIL,MOTRIN) 200 MG tablet Take 400-600 mg by mouth every 6 (six) hours as needed for mild pain or moderate pain.   Marland Kitchen PROAIR HFA 108 (90 Base) MCG/ACT inhaler INHALE 2 PUFFS INTO THE LUNGS EVERY 6 HOURS AS NEEDED FOR WHEEZING ORSHORTNESS OF BREATH. (Patient taking differently: Inhale 1-2 puffs into the lungs every 6 (six) hours as needed for wheezing or shortness of breath. )   . ketorolac (TORADOL) 10 MG tablet Take 1 tablet (10 mg total) by mouth every 8 (eight) hours as needed.   . medroxyPROGESTERone (DEPO-PROVERA) 150 MG/ML injection INJECT 1 VIAL INTRAMUSCULARLY EVERY 3 MONTHS IN OFFICE. (Patient not taking: No sig reported) 07/11/2018: Due for injection next week  . medroxyPROGESTERone (PROVERA) 10 MG tablet Take 1 tablet (10 mg total) by mouth daily.   . temazepam (RESTORIL) 15 MG capsule Take 1 capsule (15 mg total) by mouth at bedtime as needed for sleep. (Patient not taking: Reported on 07/26/2018)   . [DISCONTINUED] diazepam (VALIUM) 5 MG tablet Take 0.5 tablets (2.5 mg total) by mouth every 6 (six) hours as needed (spasms). (Patient not taking: Reported on 07/26/2018)     No facility-administered encounter medications on file as of 07/26/2018.     Subjective Stopped March 2019 but no menses having cramping and minimal brown spotting Was on depo for dysmenorrhea and menorrhagia Past Medical History:  Diagnosis Date  . Abnormal uterine bleeding (AUB) 12/09/2014  . Anxiety   . Arthritis   . Depression   . DJD (degenerative joint disease)   . Dumping syndrome   . Dysmenorrhea 07/15/2014  . Fatigue 12/24/2012  . Fibromyalgia diagnosed April 2016  . Headache   . Hx of migraine headaches 12/24/2012  . Inappropriate sinus tachycardia   . Irregular menstrual bleeding 07/15/2014  . Pelvic pain in female 03/03/2016  . Pneumothorax, spontaneous, tension   . Post concussion syndrome   . POTS (postural orthostatic tachycardia syndrome)   . PTSD (post-traumatic stress disorder)   . Scoliosis   . Thyroid disease   . Tick bite 03/03/2016  . Vitamin B 12 deficiency     Past Surgical History:  Procedure Laterality Date  . bone spurs toes Right   . CESAREAN SECTION    . COLONOSCOPY    . endooscopy    . KNEE SURGERY    . rotate cuff lt arm      OB History    Gravida  3   Para  2   Term  2   Preterm  AB  1   Living  3     SAB      TAB      Ectopic      Multiple  1   Live Births  3           Allergies  Allergen Reactions  . Prozac [Fluoxetine]     Suicidal thoughts  . Topiramate Other (See Comments)    Topamax-dizziness  . Lexapro [Escitalopram Oxalate] Other (See Comments)    Flat affect, No emotions    Social History   Socioeconomic History  . Marital status: Legally Separated    Spouse name: Not on file  . Number of children: Not on file  . Years of education: Not on file  . Highest education level: Not on file  Occupational History  . Not on file  Social Needs  . Financial resource strain: Not on file  . Food insecurity:    Worry: Not on file    Inability: Not on file  . Transportation needs:    Medical:  Not on file    Non-medical: Not on file  Tobacco Use  . Smoking status: Current Every Day Smoker    Packs/day: 1.00    Years: 15.00    Pack years: 15.00    Types: Cigarettes    Start date: 03/13/1997  . Smokeless tobacco: Never Used  . Tobacco comment: "working on it" Was smoking 2.5 packs a day  Substance and Sexual Activity  . Alcohol use: No  . Drug use: No  . Sexual activity: Not Currently    Partners: Male    Birth control/protection: Injection  Lifestyle  . Physical activity:    Days per week: Not on file    Minutes per session: Not on file  . Stress: Not on file  Relationships  . Social connections:    Talks on phone: Not on file    Gets together: Not on file    Attends religious service: Not on file    Active member of club or organization: Not on file    Attends meetings of clubs or organizations: Not on file    Relationship status: Not on file  Other Topics Concern  . Not on file  Social History Narrative  . Not on file    Family History  Problem Relation Age of Onset  . Hypertension Father   . Atrial fibrillation Father   . Alcohol abuse Father   . Cancer Maternal Grandmother        skin   . Heart disease Maternal Grandmother   . Other Maternal Grandmother        had thyroid removed  . Breast cancer Maternal Grandmother   . Heart disease Paternal Grandfather   . COPD Paternal Grandfather   . Hypertension Paternal Grandfather   . Diabetes Paternal Grandfather   . Stroke Paternal Grandfather   . Cancer Maternal Grandfather        bladder,lung  . Anxiety disorder Maternal Aunt   . Anxiety disorder Maternal Uncle   . Bipolar disorder Maternal Uncle   . Breast cancer Maternal Uncle        CML  . Leukemia Other   . Colon cancer Other        lung-2 maternal great uncles    Medications:       Current Outpatient Medications:  .  ALPRAZolam (XANAX) 1 MG tablet, Take 1 tablet (1 mg total) by mouth 4 (four) times daily., Disp: 120 tablet, Rfl:  2 .   amitriptyline (ELAVIL) 100 MG tablet, Take 1 tablet (100 mg total) by mouth at bedtime. (Patient taking differently: Take 100 mg by mouth at bedtime as needed for sleep. ), Disp: 90 tablet, Rfl: 2 .  buPROPion (WELLBUTRIN XL) 150 MG 24 hr tablet, Take 1 tablet (150 mg total) by mouth every morning., Disp: 30 tablet, Rfl: 2 .  Cyanocobalamin (B-12 PO), Take 1 tablet by mouth daily., Disp: , Rfl:  .  cyclobenzaprine (FLEXERIL) 5 MG tablet, Take 5 mg by mouth 3 (three) times daily as needed for muscle spasms., Disp: , Rfl:  .  ibuprofen (ADVIL,MOTRIN) 200 MG tablet, Take 400-600 mg by mouth every 6 (six) hours as needed for mild pain or moderate pain., Disp: , Rfl:  .  PROAIR HFA 108 (90 Base) MCG/ACT inhaler, INHALE 2 PUFFS INTO THE LUNGS EVERY 6 HOURS AS NEEDED FOR WHEEZING ORSHORTNESS OF BREATH. (Patient taking differently: Inhale 1-2 puffs into the lungs every 6 (six) hours as needed for wheezing or shortness of breath. ), Disp: 8.5 g, Rfl: 11 .  ketorolac (TORADOL) 10 MG tablet, Take 1 tablet (10 mg total) by mouth every 8 (eight) hours as needed., Disp: 15 tablet, Rfl: 0 .  medroxyPROGESTERone (DEPO-PROVERA) 150 MG/ML injection, INJECT 1 VIAL INTRAMUSCULARLY EVERY 3 MONTHS IN OFFICE. (Patient not taking: No sig reported), Disp: 1 mL, Rfl: 3 .  medroxyPROGESTERone (PROVERA) 10 MG tablet, Take 1 tablet (10 mg total) by mouth daily., Disp: 10 tablet, Rfl: 1 .  temazepam (RESTORIL) 15 MG capsule, Take 1 capsule (15 mg total) by mouth at bedtime as needed for sleep. (Patient not taking: Reported on 07/26/2018), Disp: 30 capsule, Rfl: 2  Objective Blood pressure 117/80, pulse 93, height 5' 4.5" (1.638 m), weight 179 lb (81.2 kg).  Gen WDWN NAD  Pertinent ROS   Labs or studies none    Impression Diagnoses this Encounter::   ICD-10-CM   1. Secondary amenorrhea N91.1     Established relevant diagnosis(es): Stopped depoo provera March 2019  Plan/Recommendations: Meds ordered this encounter   Medications  . medroxyPROGESTERone (PROVERA) 10 MG tablet    Sig: Take 1 tablet (10 mg total) by mouth daily.    Dispense:  10 tablet    Refill:  1  . ketorolac (TORADOL) 10 MG tablet    Sig: Take 1 tablet (10 mg total) by mouth every 8 (eight) hours as needed.    Dispense:  15 tablet    Refill:  0    Labs or Scans Ordered: No orders of the defined types were placed in this encounter.   Management:: >begin provera x 10 days to stimulate endometrial sloughing  Follow up Return if symptoms worsen or fail to improve.        Face to face time:  10 minutes  Greater than 50% of the visit time was spent in counseling and coordination of care with the patient.  The summary and outline of the counseling and care coordination is summarized in the note above.   All questions were answered.

## 2018-08-05 ENCOUNTER — Ambulatory Visit (HOSPITAL_COMMUNITY): Payer: Medicare Other | Admitting: Psychiatry

## 2018-08-19 ENCOUNTER — Encounter (HOSPITAL_COMMUNITY): Payer: Self-pay | Admitting: Psychiatry

## 2018-08-19 ENCOUNTER — Ambulatory Visit (HOSPITAL_COMMUNITY): Payer: Medicare Other | Admitting: Psychiatry

## 2018-08-19 ENCOUNTER — Ambulatory Visit (INDEPENDENT_AMBULATORY_CARE_PROVIDER_SITE_OTHER): Payer: Medicare Other | Admitting: Psychiatry

## 2018-08-19 VITALS — BP 113/80 | HR 107 | Ht 64.5 in | Wt 170.0 lb

## 2018-08-19 DIAGNOSIS — F332 Major depressive disorder, recurrent severe without psychotic features: Secondary | ICD-10-CM | POA: Diagnosis not present

## 2018-08-19 MED ORDER — AMITRIPTYLINE HCL 100 MG PO TABS: 100.0000 mg | ORAL_TABLET | Freq: Every evening | ORAL | 2 refills | Status: DC | PRN

## 2018-08-19 MED ORDER — ALPRAZOLAM 1 MG PO TABS: 1.0000 mg | ORAL_TABLET | Freq: Four times a day (QID) | ORAL | 2 refills | Status: DC

## 2018-08-19 MED ORDER — BUPROPION HCL ER (XL) 300 MG PO TB24: 300.0000 mg | ORAL_TABLET | ORAL | 2 refills | Status: DC

## 2018-08-19 MED ORDER — TEMAZEPAM 15 MG PO CAPS: 15.0000 mg | ORAL_CAPSULE | Freq: Every evening | ORAL | 2 refills | Status: DC | PRN
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
## 2018-08-22 ENCOUNTER — Other Ambulatory Visit: Payer: Self-pay | Admitting: Adult Health

## 2018-09-06 ENCOUNTER — Ambulatory Visit (HOSPITAL_COMMUNITY): Payer: Self-pay | Admitting: Psychiatry

## 2018-10-03 ENCOUNTER — Ambulatory Visit (HOSPITAL_COMMUNITY): Payer: Self-pay | Admitting: Psychiatry

## 2018-10-07 ENCOUNTER — Telehealth: Payer: Self-pay

## 2018-10-07 NOTE — Telephone Encounter (Signed)
Copied from [place] [id number]. Topic: General - Other >> Oct 07, 2018  4:52 PM Marin Olp L wrote: Reason for CRM: Patient wants to switch from [place] ([place]) to [employer] Jonni Sanger). Patient has medicare a+b and Medicare complete [facility]. [place] closer to home.

## 2018-10-07 NOTE — Telephone Encounter (Signed)
Dr. [place] - Per chart pt changed to No PCP 07/2018. Pt now asking to transfer care to Dr. Jonni Sanger. Please advise. Thanks!

## 2018-10-07 NOTE — Telephone Encounter (Signed)
Fine with me. Thanks, BJ 

## 2018-10-08 ENCOUNTER — Encounter (HOSPITAL_COMMUNITY): Payer: Self-pay | Admitting: Psychiatry

## 2018-10-08 ENCOUNTER — Ambulatory Visit (INDEPENDENT_AMBULATORY_CARE_PROVIDER_SITE_OTHER): Payer: Medicare Other | Admitting: Psychiatry

## 2018-10-08 ENCOUNTER — Encounter: Payer: Self-pay | Admitting: Adult Health

## 2018-10-08 ENCOUNTER — Other Ambulatory Visit (HOSPITAL_COMMUNITY)
Admission: RE | Admit: 2018-10-08 | Discharge: 2018-10-08 | Disposition: A | Discharge status: Home / Self Care | Payer: Medicare Other | Source: Ambulatory Visit | Attending: Adult Health | Admitting: Adult Health

## 2018-10-08 ENCOUNTER — Ambulatory Visit: Payer: Medicare Other | Admitting: Adult Health

## 2018-10-08 VITALS — BP 109/79 | HR 112 | Ht 64.5 in | Wt 172.0 lb

## 2018-10-08 VITALS — BP 122/78 | HR 101 | Ht 64.5 in | Wt 170.0 lb

## 2018-10-08 DIAGNOSIS — D72829 Elevated white blood cell count, unspecified: Secondary | ICD-10-CM

## 2018-10-08 DIAGNOSIS — N6311 Unspecified lump in the right breast, upper outer quadrant: Secondary | ICD-10-CM | POA: Insufficient documentation

## 2018-10-08 DIAGNOSIS — M79604 Pain in right leg: Secondary | ICD-10-CM | POA: Diagnosis not present

## 2018-10-08 DIAGNOSIS — Z01419 Encounter for gynecological examination (general) (routine) without abnormal findings: Secondary | ICD-10-CM | POA: Insufficient documentation

## 2018-10-08 DIAGNOSIS — M79601 Pain in right arm: Secondary | ICD-10-CM

## 2018-10-08 DIAGNOSIS — F332 Major depressive disorder, recurrent severe without psychotic features: Secondary | ICD-10-CM | POA: Diagnosis not present

## 2018-10-08 DIAGNOSIS — E049 Nontoxic goiter, unspecified: Secondary | ICD-10-CM | POA: Insufficient documentation

## 2018-10-08 DIAGNOSIS — M79602 Pain in left arm: Secondary | ICD-10-CM

## 2018-10-08 DIAGNOSIS — Z13220 Encounter for screening for lipoid disorders: Secondary | ICD-10-CM

## 2018-10-08 DIAGNOSIS — M79605 Pain in left leg: Secondary | ICD-10-CM

## 2018-10-08 DIAGNOSIS — N644 Mastodynia: Secondary | ICD-10-CM | POA: Insufficient documentation

## 2018-10-08 MED ORDER — AMITRIPTYLINE HCL 100 MG PO TABS: 100.0000 mg | ORAL_TABLET | Freq: Every evening | ORAL | 2 refills | Status: DC | PRN

## 2018-10-08 MED ORDER — ALPRAZOLAM 1 MG PO TABS: 1.0000 mg | ORAL_TABLET | Freq: Four times a day (QID) | ORAL | 2 refills | Status: DC

## 2018-10-08 NOTE — Patient Instructions (Signed)
Take vraylar 1.5 mg daily

## 2018-10-08 NOTE — Progress Notes (Signed)
Patient ID: Destiny Orr, female   DOB: Oct 28, 1980, 38 y.o.   MRN: [id number] History of Present Illness: Destiny Orr is a 38 year old white female, married, in for well woman gyn exam and pap. Has new PCP Dr Birdie Riddle.   Current Medications, Allergies, Past Medical History, Past Surgical History, Family History and Social History were reviewed in [employer] record.     Review of Systems:  Patient denies any daily headaches, hearing loss, fatigue, blurred vision, shortness of breath, chest pain, abdominal pain, problems with bowel movements, urination, or intercourse. No joint pain, but legs below knees and arms below elbows feels like bones swelling on the inside she says and feels stiff. +moody and depressed, sees Dr Harrington Challenger and has new meds. Was off depo and did not have period saw Dr Elonda Husky took provera and it was at least 6 weeks before period/Has pain right breast for over 6 months  Has had elevated WBC was supposed get bone marrow biopsy but  lost insurance,was seen at [facility].  Has goiter and needs to F/U with Dr Dorris Fetch. +hot flashes   Physical Exam:BP 109/79 (BP Location: Left Arm, Patient Position: Sitting, Cuff Size: Normal)   Pulse (!) 112   Ht 5' 4.5" (1.638 m)   Wt 172 lb (78 kg)   LMP 09/18/2018 (Exact Date)   BMI 29.07 kg/m  General:  Well developed, well nourished, no acute distress Skin:  Warm and dry,has sebaceous cyst behind left ear, about size of pea.  Neck:  Midline trachea, enlarged thyroid(known goiter), good ROM, no lymphadenopathy Lungs; Clear to auscultation bilaterally Breast:  No dominant palpable mass, retraction, or nipple discharge, on the left, on right, no retraction or nipple discharge, has pea sized mass at 10 o'clock and is tender and irregular whole UOQ. Cardiovascular: Regular rate and rhythm Abdomen:  Soft, non tender, no hepatosplenomegaly Pelvic:  External genitalia is normal in appearance, no lesions.  The vagina is normal in  appearance. Urethra has no lesions or masses. The cervix is bulbous.  Uterus is felt to be normal size, shape, and contour.  No adnexal masses or tenderness noted.Bladder is non tender, no masses felt. Rectal: Good sphincter tone, no polyps, or hemorrhoids felt.  Hemoccult negative. Extremities/musculoskeletal:  No swelling or varicosities noted, no clubbing or cyanosis Psych: Alert and cooperative,seems happy PHQ 9 score 21, denies being suicidal, on meds sees Dr Harrington Challenger. Fall risk is low. Examination chaperoned by Estill Bamberg Rash LPN. Will check labs and get mammogram.Needs to decide about birth control, use condoms for now. Never go over 3 months without period.   Impression: 1. Encounter for gynecological examination with Papanicolaou smear of cervix   2. Mass of upper outer quadrant of right breast   3. Breast tenderness   4. Leukocytosis, unspecified type   5. Goiter   6. Screening cholesterol level   7. Pain in both lower extremities   8. Pain in both upper extremities       Plan: Check CBC,CMP,TSH and lipids,ANA,RF and ESR Diagnostic bilateral mammogram and [place] if needed, scheduled at [facility] [place] condoms Apply for family planning Medicaid  Will talk when labs back, if WBC still elevated will need to go back to [facility], and may need referral to orthopedist for bone pain
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
## 2018-10-08 NOTE — Telephone Encounter (Signed)
Dr. Jonni Sanger - Please advise if you will accept pt for Transfer of Care. Thanks!

## 2018-10-09 ENCOUNTER — Telehealth: Payer: Self-pay | Admitting: Adult Health

## 2018-10-09 DIAGNOSIS — M79605 Pain in left leg: Principal | ICD-10-CM

## 2018-10-09 DIAGNOSIS — M79604 Pain in right leg: Secondary | ICD-10-CM

## 2018-10-09 LAB — ANA: Anti Nuclear Antibody(ANA): NEGATIVE

## 2018-10-09 LAB — COMPREHENSIVE METABOLIC PANEL
A/G RATIO: 2 (ref 1.2–2.2)
ALBUMIN: 4.6 g/dL (ref 3.5–5.5)
ALK PHOS: 111 IU/L (ref 39–117)
ALT: 34 IU/L — ABNORMAL HIGH (ref 0–32)
AST: 24 IU/L (ref 0–40)
BILIRUBIN TOTAL: 0.2 mg/dL (ref 0.0–1.2)
BUN/Creatinine Ratio: 12 (ref 9–23)
BUN: 9 mg/dL (ref 6–20)
CO2: 26 mmol/L (ref 20–29)
Calcium: 10.2 mg/dL (ref 8.7–10.2)
Chloride: 97 mmol/L (ref 96–106)
Creatinine, Ser: 0.76 mg/dL (ref 0.57–1.00)
GFR calc Af Amer: 116 mL/min/{1.73_m2} (ref >59)
GFR calc non Af Amer: 101 mL/min/{1.73_m2} (ref >59)
GLUCOSE: 77 mg/dL (ref 65–99)
Globulin, Total: 2.3 g/dL (ref 1.5–4.5)
POTASSIUM: 4.8 mmol/L (ref 3.5–5.2)
SODIUM: 139 mmol/L (ref 134–144)
Total Protein: 6.9 g/dL (ref 6.0–8.5)

## 2018-10-09 LAB — LIPID PANEL
CHOL/HDL RATIO: 3.4 ratio (ref 0.0–4.4)
CHOLESTEROL TOTAL: 171 mg/dL (ref 100–199)
HDL: 51 mg/dL (ref >39)
LDL CALC: 80 mg/dL (ref 0–99)
TRIGLYCERIDES: 200 mg/dL — AB (ref 0–149)
VLDL Cholesterol Cal: 40 mg/dL (ref 5–40)

## 2018-10-09 LAB — CBC
HEMOGLOBIN: 14.7 g/dL (ref 11.1–15.9)
Hematocrit: 43.2 % (ref 34.0–46.6)
MCH: 30.8 pg (ref 26.6–33.0)
MCHC: 34 g/dL (ref 31.5–35.7)
MCV: 91 fL (ref 79–97)
PLATELETS: 377 10*3/uL (ref 150–450)
RBC: 4.77 x10E6/uL (ref 3.77–5.28)
RDW: 12.5 % (ref 11.7–15.4)
WBC: 12.6 10*3/uL — ABNORMAL HIGH (ref 3.4–10.8)

## 2018-10-09 LAB — SEDIMENTATION RATE: Sed Rate: 10 mm/hr (ref 0–32)

## 2018-10-09 LAB — TSH: TSH: 2.01 u[IU]/mL (ref 0.450–4.500)

## 2018-10-09 LAB — RHEUMATOID FACTOR: Rhuematoid fact SerPl-aCnc: <10 IU/mL (ref 0.0–13.9)

## 2018-10-09 NOTE — Telephone Encounter (Signed)
I recommend continuing with Dr. Martinique at this time.  Has had very few Primary care visits.  Thank you.

## 2018-10-09 NOTE — Telephone Encounter (Signed)
Pt aware that that WBC is better, but still slightly elevated, she will call cancer center to see if they need to see her in follow up.She is aware of other labs, and will make referral to Dr Aline Brochure about pain in arms and legs

## 2018-10-10 ENCOUNTER — Ambulatory Visit (HOSPITAL_COMMUNITY): Payer: Medicare Other | Admitting: Psychiatry

## 2018-10-10 LAB — CYTOLOGY - PAP
Adequacy: ABSENT — AB
HPV (WINDOPATH): DETECTED — AB

## 2018-10-11 ENCOUNTER — Encounter: Payer: Self-pay | Admitting: Adult Health

## 2018-10-11 ENCOUNTER — Telehealth: Payer: Self-pay | Admitting: Adult Health

## 2018-10-11 DIAGNOSIS — R87618 Other abnormal cytological findings on specimens from cervix uteri: Secondary | ICD-10-CM

## 2018-10-11 DIAGNOSIS — R8789 Other abnormal findings in specimens from female genital organs: Secondary | ICD-10-CM | POA: Insufficient documentation

## 2018-10-11 HISTORY — DX: Other abnormal cytological findings on specimens from cervix uteri: R87.618

## 2018-10-11 NOTE — Telephone Encounter (Signed)
Pt aware that pap LSIL +HPV will need colpo, appt made with Dr Elonda Husky

## 2018-10-11 NOTE — Telephone Encounter (Signed)
It appears patient has switched multiple providers within [facility].  Does not seem to follow-up with prior PCP for routine preventive healthcare.  Based on this I am not sure if she would be a good fit for me.

## 2018-10-11 NOTE — Telephone Encounter (Signed)
Are you ok with transfer of care

## 2018-10-11 NOTE — Telephone Encounter (Signed)
Pt would like to see anyone in [place] that is accepting New Pt's. Please contact pt to schedule.

## 2018-10-15 ENCOUNTER — Telehealth: Payer: Self-pay | Admitting: Adult Health

## 2018-10-15 DIAGNOSIS — M79604 Pain in right leg: Secondary | ICD-10-CM

## 2018-10-15 DIAGNOSIS — M79605 Pain in left leg: Principal | ICD-10-CM

## 2018-10-15 NOTE — Telephone Encounter (Signed)
Left message that Dr Aline Brochure not taking new pts, will refer to ortho on [address]

## 2018-10-17 ENCOUNTER — Ambulatory Visit (INDEPENDENT_AMBULATORY_CARE_PROVIDER_SITE_OTHER): Payer: Medicare Other | Admitting: Family Medicine

## 2018-10-17 ENCOUNTER — Encounter (INDEPENDENT_AMBULATORY_CARE_PROVIDER_SITE_OTHER): Payer: Self-pay | Admitting: Family Medicine

## 2018-10-17 ENCOUNTER — Other Ambulatory Visit (HOSPITAL_COMMUNITY): Payer: Self-pay | Admitting: *Deleted

## 2018-10-17 DIAGNOSIS — M79601 Pain in right arm: Secondary | ICD-10-CM

## 2018-10-17 DIAGNOSIS — M79604 Pain in right leg: Secondary | ICD-10-CM

## 2018-10-17 DIAGNOSIS — M79602 Pain in left arm: Secondary | ICD-10-CM

## 2018-10-17 DIAGNOSIS — M79605 Pain in left leg: Secondary | ICD-10-CM | POA: Diagnosis not present

## 2018-10-17 DIAGNOSIS — D72829 Elevated white blood cell count, unspecified: Secondary | ICD-10-CM

## 2018-10-17 MED ORDER — PREDNISONE 10 MG PO TABS: ORAL_TABLET | ORAL | 0 refills | Status: DC

## 2018-10-17 NOTE — Progress Notes (Signed)
Office Visit Note   Patient: Destiny Orr           Date of Birth: [birth year]           MRN: [id number] Visit Date: 10/17/2018 Requested by: Estill Dooms, NP [place], [place] [address] PCP: Martinique, Betty G, MD  Subjective: Chief Complaint  Patient presents with  . pain bil elbows/forearms and bil legs    HPI: She is a 38 year old with bilateral arm and leg pain.  Symptoms started about 2 months ago with no injury.  She woke up 1 day with pain in the right elbow which quickly moved down her forearm.  Then she started having similar symptoms in her left arm and then aching pain in both legs from the knee down to her feet.  Using ibuprofen with minimal improvement.  No visible joint swelling or redness, no fevers or chills.  No change in her activities to account for this, no change in her medications.  She does have a history of chronically elevated white blood cells.  She is scheduled to have labs again tomorrow and possible bone marrow biopsy this year.  She also has a history of vitamin D deficiency but is not currently being treated for this and has not had her levels checked in a while.  She had appropriate labs drawn recently which were unremarkable, nothing to explain her current symptoms.                ROS: History of pots syndrome, chronic migraines, thyroid nodule, raynauds syndrome.  Objective: Vital Signs: LMP 09/18/2018 (Exact Date)   Physical Exam:  Arms: No swelling in the elbows, wrists or fingers.  Slightly tender at the lateral epicondyle of the right elbow.  No pain with wrist extension or flexion against resistance in either arm. Legs: 1+ patellofemoral crepitus in both knees with no effusions.  No swelling in the ankles.  No areas of point tenderness.  Skin has no visible rash arms or legs.  Imaging: None today.  Assessment & Plan: 1.  Bilateral arm and leg pain, etiology uncertain.  Question whether related to vitamin D deficiency or  possibly due to her leukocytosis. -We will add vitamin D level to her labs for tomorrow.  Prednisone taper given, but she was instructed not to take it until she has had her labs drawn tomorrow.  Follow-up as needed for this problem.   Follow-Up Instructions: No follow-ups on file.      Procedures: No procedures performed  No notes on file    PMFS History: Patient Active Problem List   Diagnosis Date Noted  . Abnormal Papanicolaou smear of cervix with positive human papilloma virus (HPV) test 10/11/2018  . Goiter 10/08/2018  . Breast tenderness 10/08/2018  . Mass of upper outer quadrant of right breast 10/08/2018  . Encounter for gynecological examination with Papanicolaou smear of cervix 10/08/2018  . Current smoker 07/03/2017  . Leukocytosis 06/18/2017  . Thyroid nodule 12/26/2016  . Neck injury, initial encounter 10/20/2016  . Concussion with loss of consciousness 10/20/2016  . Injury of left shoulder 10/20/2016  . Sinus tachycardia 10/16/2016  . Post concussion syndrome 10/03/2016  . Intractable headache 10/03/2016  . Dizziness 10/03/2016  . Blurry vision, right eye 10/03/2016  . Nodular goiter 06/18/2016  . Fibromyalgia syndrome 06/16/2016  . Raynaud's syndrome without gangrene 06/16/2016  . Numbness and tingling sensation of skin 06/16/2016  . B12 deficiency 06/16/2016  . Dizziness and giddiness 06/16/2016  .  Pelvic pain in female 03/03/2016  . Tick bite 03/03/2016  . POTS (postural orthostatic tachycardia syndrome) 02/05/2016  . Abnormal uterine bleeding (AUB) 12/09/2014  . Depression 11/26/2014  . Dysmenorrhea 07/15/2014  . Irregular menstrual bleeding 07/15/2014  . Back pain, chronic 04/02/2013  . Chronic fatigue and malaise 12/24/2012  . Anxiety 12/24/2012  . PTSD (post-traumatic stress disorder) 12/24/2012  . ADD (attention deficit disorder) 12/24/2012  . Contraception 12/24/2012  . Hx of migraine headaches 12/24/2012   Past Medical History:    Diagnosis Date  . Abnormal Papanicolaou smear of cervix with positive human papilloma virus (HPV) test 10/11/2018   LSIL +HPV, get colpo___  . Abnormal uterine bleeding (AUB) 12/09/2014  . Anxiety   . Arthritis   . Depression   . DJD (degenerative joint disease)   . Dumping syndrome   . Dysmenorrhea 07/15/2014  . Fatigue 12/24/2012  . Fibromyalgia diagnosed April 2016  . Headache   . Hx of migraine headaches 12/24/2012  . Inappropriate sinus tachycardia   . Irregular menstrual bleeding 07/15/2014  . Pelvic pain in female 03/03/2016  . Pneumothorax, spontaneous, tension   . Post concussion syndrome   . POTS (postural orthostatic tachycardia syndrome)   . PTSD (post-traumatic stress disorder)   . Scoliosis   . Thyroid disease   . Tick bite 03/03/2016  . Vitamin B 12 deficiency     Family History  Problem Relation Age of Onset  . Hypertension Father   . Atrial fibrillation Father   . Alcohol abuse Father   . Cancer Maternal Grandmother        skin   . Heart disease Maternal Grandmother   . Other Maternal Grandmother        had thyroid removed  . Breast cancer Maternal Grandmother   . Heart disease Paternal Grandfather   . COPD Paternal Grandfather   . Hypertension Paternal Grandfather   . Diabetes Paternal Grandfather   . Stroke Paternal Grandfather   . Cancer Maternal Grandfather        bladder,lung  . Anxiety disorder Maternal Aunt   . Anxiety disorder Maternal Uncle   . Bipolar disorder Maternal Uncle   . Breast cancer Maternal Uncle        CML  . Leukemia Other   . Colon cancer Other        lung-2 maternal great uncles    Past Surgical History:  Procedure Laterality Date  . bone spurs toes Right   . CESAREAN SECTION    . COLONOSCOPY    . endooscopy    . KNEE SURGERY    . rotate cuff lt arm     Social History   Occupational History  . Not on file  Tobacco Use  . Smoking status: Current Every Day Smoker    Packs/day: 1.00    Years: 15.00    Pack years:  15.00    Types: Cigarettes    Start date: 03/13/1997  . Smokeless tobacco: Never Used  . Tobacco comment: "working on it" Was smoking 2.5 packs a day  Substance and Sexual Activity  . Alcohol use: No  . Drug use: No  . Sexual activity: Yes    Partners: Male    Birth control/protection: None

## 2018-10-18 ENCOUNTER — Inpatient Hospital Stay (HOSPITAL_COMMUNITY): Payer: Medicare Other | Attending: Hematology

## 2018-10-18 ENCOUNTER — Ambulatory Visit: Payer: Medicare Other | Admitting: Obstetrics & Gynecology

## 2018-10-18 ENCOUNTER — Other Ambulatory Visit (HOSPITAL_COMMUNITY)
Admission: RE | Admit: 2018-10-18 | Discharge: 2018-10-18 | Disposition: A | Discharge status: Home / Self Care | Payer: Medicare Other | Source: Ambulatory Visit | Attending: Family Medicine | Admitting: Family Medicine

## 2018-10-18 ENCOUNTER — Telehealth (HOSPITAL_COMMUNITY): Payer: Self-pay | Admitting: *Deleted

## 2018-10-18 ENCOUNTER — Encounter: Payer: Self-pay | Admitting: Obstetrics & Gynecology

## 2018-10-18 ENCOUNTER — Other Ambulatory Visit (HOSPITAL_COMMUNITY): Payer: Self-pay | Admitting: Psychiatry

## 2018-10-18 VITALS — BP 103/68 | HR 93 | Ht 64.2 in | Wt 176.0 lb

## 2018-10-18 DIAGNOSIS — M79602 Pain in left arm: Secondary | ICD-10-CM | POA: Insufficient documentation

## 2018-10-18 DIAGNOSIS — Z3202 Encounter for pregnancy test, result negative: Secondary | ICD-10-CM

## 2018-10-18 DIAGNOSIS — F4310 Post-traumatic stress disorder, unspecified: Secondary | ICD-10-CM | POA: Insufficient documentation

## 2018-10-18 DIAGNOSIS — M79601 Pain in right arm: Secondary | ICD-10-CM | POA: Diagnosis not present

## 2018-10-18 DIAGNOSIS — R0602 Shortness of breath: Secondary | ICD-10-CM | POA: Insufficient documentation

## 2018-10-18 DIAGNOSIS — Z806 Family history of leukemia: Secondary | ICD-10-CM | POA: Diagnosis not present

## 2018-10-18 DIAGNOSIS — F17210 Nicotine dependence, cigarettes, uncomplicated: Secondary | ICD-10-CM | POA: Insufficient documentation

## 2018-10-18 DIAGNOSIS — Z803 Family history of malignant neoplasm of breast: Secondary | ICD-10-CM | POA: Insufficient documentation

## 2018-10-18 DIAGNOSIS — D72829 Elevated white blood cell count, unspecified: Secondary | ICD-10-CM | POA: Insufficient documentation

## 2018-10-18 DIAGNOSIS — M79604 Pain in right leg: Secondary | ICD-10-CM | POA: Insufficient documentation

## 2018-10-18 DIAGNOSIS — M79605 Pain in left leg: Secondary | ICD-10-CM | POA: Diagnosis not present

## 2018-10-18 DIAGNOSIS — M2550 Pain in unspecified joint: Secondary | ICD-10-CM | POA: Insufficient documentation

## 2018-10-18 DIAGNOSIS — Z800 Family history of malignant neoplasm of digestive organs: Secondary | ICD-10-CM | POA: Insufficient documentation

## 2018-10-18 DIAGNOSIS — N870 Mild cervical dysplasia: Secondary | ICD-10-CM | POA: Diagnosis not present

## 2018-10-18 LAB — COMPREHENSIVE METABOLIC PANEL
ALK PHOS: 70 U/L (ref 38–126)
ALT: 29 U/L (ref 0–44)
ANION GAP: 10 (ref 5–15)
AST: 23 U/L (ref 15–41)
Albumin: 4 g/dL (ref 3.5–5.0)
BILIRUBIN TOTAL: 0.3 mg/dL (ref 0.3–1.2)
BUN: 15 mg/dL (ref 6–20)
CALCIUM: 9.1 mg/dL (ref 8.9–10.3)
CO2: 23 mmol/L (ref 22–32)
Chloride: 102 mmol/L (ref 98–111)
Creatinine, Ser: 0.88 mg/dL (ref 0.44–1.00)
GFR calc Af Amer: >60 mL/min (ref >60)
GLUCOSE: 147 mg/dL — AB (ref 70–99)
POTASSIUM: 4.6 mmol/L (ref 3.5–5.1)
Sodium: 135 mmol/L (ref 135–145)
TOTAL PROTEIN: 6.9 g/dL (ref 6.5–8.1)

## 2018-10-18 LAB — CBC WITH DIFFERENTIAL/PLATELET
Abs Immature Granulocytes: 0.04 10*3/uL (ref 0.00–0.07)
Basophils Absolute: 0.1 10*3/uL (ref 0.0–0.1)
Basophils Relative: 1 %
EOS ABS: 0.2 10*3/uL (ref 0.0–0.5)
EOS PCT: 1 %
HEMATOCRIT: 43.6 % (ref 36.0–46.0)
HEMOGLOBIN: 14.3 g/dL (ref 12.0–15.0)
Immature Granulocytes: 0 %
LYMPHS PCT: 23 %
Lymphs Abs: 2.7 10*3/uL (ref 0.7–4.0)
MCH: 30.8 pg (ref 26.0–34.0)
MCHC: 32.8 g/dL (ref 30.0–36.0)
MCV: 93.8 fL (ref 80.0–100.0)
MONO ABS: 0.5 10*3/uL (ref 0.1–1.0)
MONOS PCT: 5 %
Neutro Abs: 8.4 10*3/uL — ABNORMAL HIGH (ref 1.7–7.7)
Neutrophils Relative %: 70 %
Platelets: 292 10*3/uL (ref 150–400)
RBC: 4.65 MIL/uL (ref 3.87–5.11)
RDW: 12.3 % (ref 11.5–15.5)
WBC: 11.9 10*3/uL — ABNORMAL HIGH (ref 4.0–10.5)
nRBC: 0 % (ref 0.0–0.2)

## 2018-10-18 LAB — POCT URINE PREGNANCY: PREG TEST UR: NEGATIVE

## 2018-10-18 MED ORDER — CARIPRAZINE HCL 1.5 MG PO CAPS: 1.5000 mg | ORAL_CAPSULE | Freq: Every day | ORAL | 2 refills | Status: DC

## 2018-10-18 NOTE — Telephone Encounter (Signed)
Dr Harrington Challenger Patient called & the Vraylar samples is working well & she would like to try. Will call her Rx to find out her co-pay

## 2018-10-18 NOTE — Progress Notes (Signed)
Colposcopy Procedure Note:  Colposcopy Procedure Note  Indications: Pap smear 1 months ago showed: low-grade squamous intraepithelial neoplasia (LGSIL - encompassing HPV,mild dysplasia,CIN I). The prior pap showed no abnormalities.  Prior cervical/vaginal disease: normal exam without visible pathology. Prior cervical treatment: no treatment.  Smoker:  Yes.   New sexual partner:  No.    History of abnormal Pap: no  Procedure Details  The risks and benefits of the procedure and Written informed consent obtained.  Speculum placed in vagina and excellent visualization of cervix achieved, cervix swabbed x 3 with acetic acid solution.  Findings: Cervix: no visible lesions, no mosaicism, no punctation and no abnormal vasculature; SCJ visualized 360 degrees without lesions and no biopsies taken.  Mild AWE noted not biopsy worthy Vaginal inspection: normal without visible lesions. Vulvar colposcopy: vulvar colposcopy not performed.  Specimens: none  Complications: none.  Plan: Repeat Pap testing in 1 year

## 2018-10-18 NOTE — Telephone Encounter (Signed)
I have sent it to pharmacy

## 2018-10-19 ENCOUNTER — Telehealth (INDEPENDENT_AMBULATORY_CARE_PROVIDER_SITE_OTHER): Payer: Self-pay | Admitting: Family Medicine

## 2018-10-19 LAB — VITAMIN D 25 HYDROXY (VIT D DEFICIENCY, FRACTURES): Vit D, 25-Hydroxy: 21.3 ng/mL — ABNORMAL LOW (ref 30.0–100.0)

## 2018-10-19 NOTE — Telephone Encounter (Signed)
Vitamin D is very low at 21 (target is 50-80).

## 2018-10-21 ENCOUNTER — Ambulatory Visit (HOSPITAL_COMMUNITY): Payer: Medicare Other | Admitting: Psychiatry

## 2018-10-21 ENCOUNTER — Telehealth (HOSPITAL_COMMUNITY): Payer: Self-pay | Admitting: *Deleted

## 2018-10-21 NOTE — Telephone Encounter (Signed)
[employer] APPROVED VRAYLAR CAP 1.5 MG  UNDER MEDICARE PART D BENEFIT                   APPROVED THROUGH 10/02/2019  PA# [id number]

## 2018-10-22 ENCOUNTER — Other Ambulatory Visit (HOSPITAL_COMMUNITY): Payer: Self-pay

## 2018-10-22 ENCOUNTER — Ambulatory Visit (HOSPITAL_COMMUNITY)
Admission: RE | Admit: 2018-10-22 | Discharge: 2018-10-22 | Disposition: A | Discharge status: Home / Self Care | Payer: Medicare Other | Source: Ambulatory Visit | Attending: Adult Health | Admitting: Adult Health

## 2018-10-22 ENCOUNTER — Ambulatory Visit (HOSPITAL_COMMUNITY): Payer: Medicare Other

## 2018-10-22 DIAGNOSIS — N6311 Unspecified lump in the right breast, upper outer quadrant: Secondary | ICD-10-CM | POA: Diagnosis not present

## 2018-10-22 DIAGNOSIS — N644 Mastodynia: Secondary | ICD-10-CM

## 2018-10-22 DIAGNOSIS — N6314 Unspecified lump in the right breast, lower inner quadrant: Secondary | ICD-10-CM | POA: Diagnosis not present

## 2018-10-22 DIAGNOSIS — R928 Other abnormal and inconclusive findings on diagnostic imaging of breast: Secondary | ICD-10-CM | POA: Diagnosis not present

## 2018-10-22 DIAGNOSIS — N6312 Unspecified lump in the right breast, upper inner quadrant: Secondary | ICD-10-CM | POA: Diagnosis not present

## 2018-10-23 ENCOUNTER — Inpatient Hospital Stay (HOSPITAL_COMMUNITY): Payer: Medicare Other | Admitting: Internal Medicine

## 2018-10-23 ENCOUNTER — Encounter (HOSPITAL_COMMUNITY): Payer: Self-pay | Admitting: Internal Medicine

## 2018-10-23 VITALS — BP 124/78 | HR 76 | Temp 98.8°F | Resp 18 | Wt 176.0 lb

## 2018-10-23 DIAGNOSIS — D72829 Elevated white blood cell count, unspecified: Secondary | ICD-10-CM | POA: Diagnosis not present

## 2018-10-23 DIAGNOSIS — Z806 Family history of leukemia: Secondary | ICD-10-CM

## 2018-10-23 DIAGNOSIS — D72828 Other elevated white blood cell count: Secondary | ICD-10-CM

## 2018-10-23 DIAGNOSIS — Z803 Family history of malignant neoplasm of breast: Secondary | ICD-10-CM

## 2018-10-23 DIAGNOSIS — M2550 Pain in unspecified joint: Secondary | ICD-10-CM | POA: Diagnosis not present

## 2018-10-23 DIAGNOSIS — F4310 Post-traumatic stress disorder, unspecified: Secondary | ICD-10-CM | POA: Diagnosis not present

## 2018-10-23 DIAGNOSIS — F17210 Nicotine dependence, cigarettes, uncomplicated: Secondary | ICD-10-CM

## 2018-10-23 DIAGNOSIS — R0602 Shortness of breath: Secondary | ICD-10-CM

## 2018-10-23 DIAGNOSIS — Z800 Family history of malignant neoplasm of digestive organs: Secondary | ICD-10-CM | POA: Diagnosis not present

## 2018-10-23 NOTE — Patient Instructions (Signed)
[facility] Discharge Instructions  You were seen by Dr. Higgs today   Thank you for choosing [facility] to provide your oncology and hematology care.  To afford each patient quality time with our provider, please arrive at least 15 minutes before your scheduled appointment time.   If you have a lab appointment with the Cancer Center please come in thru the  Main Entrance and check in at the main information desk  You need to re-schedule your appointment should you arrive 10 or more minutes late.  We strive to give you quality time with our providers, and arriving late affects you and other patients whose appointments are after yours.  Also, if you no show three or more times for appointments you may be dismissed from the clinic at the providers discretion.     Again, thank you for choosing [facility].  Our hope is that these requests will decrease the amount of time that you wait before being seen by our physicians.       _____________________________________________________________  Should you have questions after your visit to  Cancer Center, please contact our office at [phone] between the hours of 8:00 a.m. and 4:30 p.m.  Voicemails left after 4:00 p.m. will not be returned until the following business day.  For prescription refill requests, have your pharmacy contact our office and allow 72 hours.    Cancer Center Support Programs:   > Cancer Support Group  2nd Tuesday of the month 1pm-2pm, Journey Room   

## 2018-10-23 NOTE — Progress Notes (Signed)
Diagnosis Other elevated white blood cell (WBC) count - Plan: CBC with Differential/Platelet, Comprehensive metabolic panel, Lactate dehydrogenase, Sedimentation rate, C-reactive protein  Staging Cancer Staging No matching staging information was found for the patient.  Assessment and Plan:  1.  Leukocytosis.  Pt was previously followed by Dr. Talbert Cage and was last seen at [facility] in 07/2017 reportedly due to losing insurance.    Previous labs done 06/18/2017 reviewed and showed  BCR-ABL was negative for CML. JAK2 mutation with reflex to CALR/Exon 12/MPL to rule out underlying myeloproliferative disorder were all negative as well. Pt continues to smoke and is suspected to have reactive leukocytosis from chronic smoking and also inflammation from underlying POTS.   Labs done 10/18/2018 reviewed and showed WBC 11.9 HB 14.3 plts 292,000.  Chemistries WNL with K+ 4.6 Cr 0.88 and normal LFTs.   Pt reports she is on Prednisone currently for only 10 days.  I have discussed with her steroids may also affect WBC count.  WBC is actually improved from labs done previously.  She will RTC in 04/2019 for labs and follow-up.    2.  Joint pain.  Will check CRP and sed rate on RTC.   3.  PTSD.  Follow-up with psychiatry or PCP as directed.   4  Smoking/SOB.  Cessation is recommended.  Pt had CXR in 07/2018 that was negative.  Pulse ox 100% on room air.  Follow-up with PCP if ongoing symptoms.    5.  Health maintenance.  Mammogram screenings as recommended.    Current Status:  Pt is seen today for follow-up.  She reports she did not follow-up since 2018 due to loss of insurance.  She is here to go over recent labs.    Problem List Patient Active Problem List   Diagnosis Date Noted  . Abnormal Papanicolaou smear of cervix with positive human papilloma virus (HPV) test [R87.89] 10/11/2018  . Goiter [E04.9] 10/08/2018  . Breast tenderness [N64.4] 10/08/2018  . Mass of upper outer quadrant of right breast [N63.11]  10/08/2018  . Encounter for gynecological examination with Papanicolaou smear of cervix [Z01.419] 10/08/2018  . Current smoker [F17.200] 07/03/2017  . Leukocytosis [D72.829] 06/18/2017  . Thyroid nodule [E04.1] 12/26/2016  . Neck injury, initial encounter [S19.9XXA] 10/20/2016  . Concussion with loss of consciousness [S06.0X9A] 10/20/2016  . Injury of left shoulder [S49.92XA] 10/20/2016  . Sinus tachycardia [R00.0] 10/16/2016  . Post concussion syndrome [F07.81] 10/03/2016  . Intractable headache [R51] 10/03/2016  . Dizziness [R42] 10/03/2016  . Blurry vision, right eye [H53.8] 10/03/2016  . Nodular goiter [E04.9] 06/18/2016  . Fibromyalgia syndrome [M79.7] 06/16/2016  . Raynaud's syndrome without gangrene [I73.00] 06/16/2016  . Numbness and tingling sensation of skin [R20.0, R20.2] 06/16/2016  . B12 deficiency [E53.8] 06/16/2016  . Dizziness and giddiness [R42] 06/16/2016  . Pelvic pain in female [R10.2] 03/03/2016  . Tick bite [id number]] 03/03/2016  . POTS (postural orthostatic tachycardia syndrome) [R00.0, I95.1] 02/05/2016  . Abnormal uterine bleeding (AUB) [N93.9] 12/09/2014  . Depression [F32.9] 11/26/2014  . Dysmenorrhea [N94.6] 07/15/2014  . Irregular menstrual bleeding [N92.6] 07/15/2014  . Back pain, chronic [M54.9, G89.29] 04/02/2013  . Chronic fatigue and malaise [R53.82, R53.81] 12/24/2012  . Anxiety [F41.9] 12/24/2012  . PTSD (post-traumatic stress disorder) [F43.10] 12/24/2012  . ADD (attention deficit disorder) [F98.8] 12/24/2012  . Contraception [IMO0001] 12/24/2012  . Hx of migraine headaches [Z86.69] 12/24/2012    Past Medical History Past Medical History:  Diagnosis Date  . Abnormal Papanicolaou smear of cervix with  positive human papilloma virus (HPV) test 10/11/2018   LSIL +HPV, get colpo___  . Abnormal uterine bleeding (AUB) 12/09/2014  . Anxiety   . Arthritis   . Depression   . DJD (degenerative joint disease)   . Dumping syndrome   . Dysmenorrhea  07/15/2014  . Fatigue 12/24/2012  . Fibromyalgia diagnosed April 2016  . Headache   . Hx of migraine headaches 12/24/2012  . Inappropriate sinus tachycardia   . Irregular menstrual bleeding 07/15/2014  . Pelvic pain in female 03/03/2016  . Pneumothorax, spontaneous, tension   . Post concussion syndrome   . POTS (postural orthostatic tachycardia syndrome)   . PTSD (post-traumatic stress disorder)   . Scoliosis   . Thyroid disease   . Tick bite 03/03/2016  . Vitamin B 12 deficiency     Past Surgical History Past Surgical History:  Procedure Laterality Date  . bone spurs toes Right   . CESAREAN SECTION    . COLONOSCOPY    . endooscopy    . KNEE SURGERY    . rotate cuff lt arm      Family History Family History  Problem Relation Age of Onset  . Hypertension Father   . Atrial fibrillation Father   . Alcohol abuse Father   . Cancer Maternal Grandmother        skin   . Heart disease Maternal Grandmother   . Other Maternal Grandmother        had thyroid removed  . Breast cancer Maternal Grandmother   . Heart disease Paternal Grandfather   . COPD Paternal Grandfather   . Hypertension Paternal Grandfather   . Diabetes Paternal Grandfather   . Stroke Paternal Grandfather   . Cancer Maternal Grandfather        bladder,lung  . Anxiety disorder Maternal Aunt   . Anxiety disorder Maternal Uncle   . Bipolar disorder Maternal Uncle   . Breast cancer Maternal Uncle        CML  . Leukemia Other   . Colon cancer Other        lung-2 maternal great uncles     Social History  reports that she has been smoking cigarettes. She started smoking about 21 years ago. She has a 15.00 pack-year smoking history. She has never used smokeless tobacco. She reports that she does not drink alcohol or use drugs.  Medications  Current Outpatient Medications:  .  ALPRAZolam (XANAX) 1 MG tablet, Take 1 tablet (1 mg total) by mouth 4 (four) times daily., Disp: 120 tablet, Rfl: 2 .  amitriptyline  (ELAVIL) 100 MG tablet, Take 1 tablet (100 mg total) by mouth at bedtime as needed for sleep., Disp: 30 tablet, Rfl: 2 .  calcium-vitamin D (OSCAL WITH D) 250-125 MG-UNIT tablet, Take 1 tablet by mouth daily., Disp: , Rfl:  .  cariprazine (VRAYLAR) capsule, Take 1 capsule (1.5 mg total) by mouth daily., Disp: 30 capsule, Rfl: 2 .  Cyanocobalamin (B-12 PO), Take 1 tablet by mouth daily., Disp: , Rfl:  .  cyclobenzaprine (FLEXERIL) 5 MG tablet, Take 5 mg by mouth 3 (three) times daily as needed for muscle spasms., Disp: , Rfl:  .  ibuprofen (ADVIL,MOTRIN) 200 MG tablet, Take 400-600 mg by mouth every 6 (six) hours as needed for mild pain or moderate pain., Disp: , Rfl:  .  predniSONE (DELTASONE) 10 MG tablet, Take as directed for 12 days.  Daily dose 6,6,5,5,4,4,3,3,2,2,1,1. (Patient not taking: Reported on 10/18/2018), Disp: 42 tablet, Rfl: 0  Allergies  Prozac [fluoxetine]; Topiramate; and Lexapro [escitalopram oxalate]  Review of Systems Review of Systems - Oncology ROS negative other than SOB   Physical Exam  Vitals Wt Readings from Last 3 Encounters:  10/23/18 176 lb (79.8 kg)  10/18/18 176 lb (79.8 kg)  10/08/18 172 lb (78 kg)   Temp Readings from Last 3 Encounters:  10/23/18 98.8 F (37.1 C) (Oral)  07/11/18 98.7 F (37.1 C) (Oral)  06/18/17 99.8 F (37.7 C) (Oral)   BP Readings from Last 3 Encounters:  10/23/18 124/78  10/18/18 103/68  10/08/18 109/79   Pulse Readings from Last 3 Encounters:  10/23/18 76  10/18/18 93  10/08/18 (!) 112   Constitutional: Well-developed, well-nourished, and in no distress.   HENT: Head: Normocephalic and atraumatic.  Mouth/Throat: No oropharyngeal exudate. Mucosa moist. Eyes: Pupils are equal, round, and reactive to light. Conjunctivae are normal. No scleral icterus.  Neck: Normal range of motion. Neck supple. No JVD present.  Cardiovascular: Normal rate, regular rhythm and normal heart sounds.  Exam reveals no gallop and no friction  rub.   No murmur heard. Pulmonary/Chest: Effort normal and breath sounds normal. No respiratory distress. No wheezes.No rales.  Abdominal: Soft. Bowel sounds are normal. No distension. There is no tenderness. There is no guarding.  Musculoskeletal: No edema or tenderness.  Lymphadenopathy: No cervical, axillary or supraclavicular adenopathy.  Neurological: Alert and oriented to person, place, and time. No cranial nerve deficit.  Skin: Skin is warm and dry. No rash noted. No erythema. No pallor.  Psychiatric: Affect and judgment normal.   Labs No visits with results within 3 Day(s) from this visit.  Latest known visit with results is:  Hospital Outpatient Visit on 10/18/2018  Component Date Value Ref Range Status  . Vit D, 25-Hydroxy 10/18/2018 21.3* 30.0 - 100.0 ng/mL Final   Comment: (NOTE) Vitamin D deficiency has been defined by the [place] practice guideline as a level of serum 25-OH vitamin D less than 20 ng/mL (1,2). The Endocrine Society went on to further define vitamin D insufficiency as a level between 21 and 29 ng/mL (2). 1. IOM (Institute of Medicine). 2010. Dietary reference   intakes for calcium and D. [place]: The   [employer]. 2. Holick MF, Binkley Madill, Bischoff-Ferrari HA, et al.   Evaluation, treatment, and prevention of vitamin D   deficiency: an Endocrine Society clinical practice   guideline. JCEM. 2011 Jul; 96(7):1911-30. Performed At: [facility] [place], [place] [id number] Rush Farmer MD DD:[id number]      Pathology Orders Placed This Encounter  Procedures  . CBC with Differential/Platelet    Standing Status:   Future    Standing Expiration Date:   10/24/2019  . Comprehensive metabolic panel    Standing Status:   Future    Standing Expiration Date:   10/24/2019  . Lactate dehydrogenase    Standing Status:   Future    Standing Expiration Date:   10/24/2019  .  Sedimentation rate    Standing Status:   Future    Standing Expiration Date:   10/24/2019  . C-reactive protein    Standing Status:   Future    Standing Expiration Date:   10/24/2019       Zoila Shutter MD

## 2018-10-24 ENCOUNTER — Ambulatory Visit (INDEPENDENT_AMBULATORY_CARE_PROVIDER_SITE_OTHER): Payer: Medicare Other | Admitting: Psychiatry

## 2018-10-24 ENCOUNTER — Ambulatory Visit (HOSPITAL_COMMUNITY): Payer: Medicare Other | Admitting: Psychiatry

## 2018-10-24 DIAGNOSIS — F332 Major depressive disorder, recurrent severe without psychotic features: Secondary | ICD-10-CM | POA: Diagnosis not present

## 2018-10-24 NOTE — Progress Notes (Signed)
   THERAPIST PROGRESS NOTE  Session Time: Thursday 10/24/2018  2:05 PM - 2:45 PM  Participation Level: Active  Behavioral Response: CasualAlertDepressed  Type of Therapy: Individual Therapy  Treatment Goals addressed:      1. Learn and implement strategies to cope with feelings of depression                                                             2. Identify and replace thoughts and beliefs that support depression.                                                             3. Process and resolve grief and loss issues related to changed physical functioning Interventions: CBT and Supportive  Summary: Destiny Orr is a 38 y.o. female who presents with  a long standing history of symptoms of depression and anxiety. She has multiple stressors including changed physical functioning due to numerous health problems and injuring her right foot during a fall on her job several years ago. She and her three children reside with her parents and she says this is stressful as they do no have adequate space and father has a history of drinking alcohol. Patient's symptoms include  fatigue, depressed mood, feelings of hopelessness and helplessness, worthlessness, anxiety, excessive worry, insomnia, poor concentration, and memory difficulty, loss of interest, poor motivation.  Patient last was seen in  April 2019.  She reports little to no change in symptoms and her situation. She has been approved for SSDI but still reports financial strain. She and her children continue to reside with her parents and this remains very stressful for patient. She expresses disappointment about recent efforts to find a house as the ones she liked were under contract. She reports thoughts of being a failure since she can't get a house for self and children. She  also worries about her daughter who is still having issues with glucose level.    Suicidal/Homicidal: Nowithout intent/plan  Therapist Response: reestablished rapport,  discussed stressors, facilitated expression of thoughts and feelings, validated feelings, began to assist patient identify values and  areas for focus regarding treatment  Plan: Return again in 2 weeks.  Diagnosis: Axis I: MDD    Axis II: Deferred    Alonza Smoker, LCSW 10/24/2018

## 2018-11-04 ENCOUNTER — Other Ambulatory Visit: Payer: Self-pay | Admitting: "Endocrinology

## 2018-11-04 ENCOUNTER — Ambulatory Visit: Payer: Medicare Other | Admitting: "Endocrinology

## 2018-11-04 ENCOUNTER — Other Ambulatory Visit (HOSPITAL_COMMUNITY)
Admission: RE | Admit: 2018-11-04 | Discharge: 2018-11-04 | Disposition: A | Discharge status: Home / Self Care | Payer: Medicare Other | Source: Ambulatory Visit | Attending: "Endocrinology | Admitting: "Endocrinology

## 2018-11-04 DIAGNOSIS — R000 Tachycardia, unspecified: Secondary | ICD-10-CM

## 2018-11-04 LAB — T4, FREE: Free T4: 0.77 ng/dL — ABNORMAL LOW (ref 0.82–1.77)

## 2018-11-06 ENCOUNTER — Other Ambulatory Visit (HOSPITAL_COMMUNITY)
Admission: RE | Admit: 2018-11-06 | Discharge: 2018-11-06 | Disposition: A | Discharge status: Home / Self Care | Payer: Medicare Other | Source: Ambulatory Visit | Attending: "Endocrinology | Admitting: "Endocrinology

## 2018-11-06 DIAGNOSIS — R000 Tachycardia, unspecified: Secondary | ICD-10-CM | POA: Diagnosis not present

## 2018-11-07 ENCOUNTER — Other Ambulatory Visit (HOSPITAL_COMMUNITY)
Admission: RE | Admit: 2018-11-07 | Discharge: 2018-11-07 | Disposition: A | Discharge status: Home / Self Care | Payer: Medicare Other | Source: Ambulatory Visit | Attending: "Endocrinology | Admitting: "Endocrinology

## 2018-11-08 ENCOUNTER — Ambulatory Visit (INDEPENDENT_AMBULATORY_CARE_PROVIDER_SITE_OTHER): Payer: Medicare Other | Admitting: Psychiatry

## 2018-11-08 ENCOUNTER — Encounter (HOSPITAL_COMMUNITY): Payer: Self-pay | Admitting: Psychiatry

## 2018-11-08 ENCOUNTER — Other Ambulatory Visit (HOSPITAL_COMMUNITY)
Admission: RE | Admit: 2018-11-08 | Discharge: 2018-11-08 | Disposition: A | Discharge status: Home / Self Care | Payer: Medicare Other | Source: Ambulatory Visit | Attending: "Endocrinology | Admitting: "Endocrinology

## 2018-11-08 VITALS — BP 106/69 | HR 107 | Ht 64.0 in | Wt 178.0 lb

## 2018-11-08 DIAGNOSIS — R000 Tachycardia, unspecified: Secondary | ICD-10-CM | POA: Insufficient documentation

## 2018-11-08 DIAGNOSIS — F332 Major depressive disorder, recurrent severe without psychotic features: Secondary | ICD-10-CM | POA: Diagnosis not present

## 2018-11-08 LAB — CATECHOLAMINES,UR.,FREE,24 HR
Dopamine, Rand Ur: 88 ug/L
Dopamine, Ur, 24Hr: 238 ug/24 hr (ref 0–510)
Epinephrine, Rand Ur: 6 ug/L
Epinephrine, U, 24Hr: 16 ug/24 hr (ref 0–20)
Norepinephrine, Rand Ur: 14 ug/L
Norepinephrine,U,24H: 38 ug/24 hr (ref 0–135)
TOTAL VOLUME: 2700

## 2018-11-08 MED ORDER — CARIPRAZINE HCL 3 MG PO CAPS: 3.0000 mg | ORAL_CAPSULE | Freq: Every day | ORAL | 2 refills | Status: DC

## 2018-11-08 MED ORDER — ALPRAZOLAM 1 MG PO TABS: 1.0000 mg | ORAL_TABLET | Freq: Four times a day (QID) | ORAL | 2 refills | Status: DC
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
## 2018-11-13 LAB — METANEPHRINES, URINE, 24 HOUR
Metaneph Total, Ur: 46 ug/L
Metanephrines, 24H Ur: 170 ug/24 hr (ref 45–290)
Normetanephrine, 24H Ur: 333 ug/24 hr (ref 82–500)
Normetanephrine, Ur: 90 ug/L
Total Volume: 3700

## 2018-11-15 ENCOUNTER — Ambulatory Visit (INDEPENDENT_AMBULATORY_CARE_PROVIDER_SITE_OTHER): Payer: Medicare Other | Admitting: "Endocrinology

## 2018-11-15 ENCOUNTER — Encounter: Payer: Self-pay | Admitting: "Endocrinology

## 2018-11-15 VITALS — BP 116/76 | HR 79 | Ht 65.0 in | Wt 181.4 lb

## 2018-11-15 DIAGNOSIS — E049 Nontoxic goiter, unspecified: Secondary | ICD-10-CM | POA: Diagnosis not present

## 2018-11-15 DIAGNOSIS — E039 Hypothyroidism, unspecified: Secondary | ICD-10-CM | POA: Insufficient documentation

## 2018-11-15 MED ORDER — LEVOTHYROXINE SODIUM 25 MCG PO TABS: 25.0000 ug | ORAL_TABLET | Freq: Every day | ORAL | 3 refills | Status: DC

## 2018-11-15 NOTE — Progress Notes (Signed)
Destiny Orr, CMA  

## 2018-11-15 NOTE — Progress Notes (Signed)
Endocrinology follow-up note  Subjective:    Patient ID: Destiny Orr, female    DOB: 1981/01/04, PCP Eunice Blase, MD   Past Medical History:  Diagnosis Date  . Abnormal Papanicolaou smear of cervix with positive human papilloma virus (HPV) test 10/11/2018   LSIL +HPV, get colpo___  . Abnormal uterine bleeding (AUB) 12/09/2014  . Anxiety   . Arthritis   . Depression   . DJD (degenerative joint disease)   . Dumping syndrome   . Dysmenorrhea 07/15/2014  . Fatigue 12/24/2012  . Fibromyalgia diagnosed April 2016  . Headache   . Hx of migraine headaches 12/24/2012  . Inappropriate sinus tachycardia   . Irregular menstrual bleeding 07/15/2014  . Pelvic pain in female 03/03/2016  . Pneumothorax, spontaneous, tension   . Post concussion syndrome   . POTS (postural orthostatic tachycardia syndrome)   . PTSD (post-traumatic stress disorder)   . Scoliosis   . Thyroid disease   . Tick bite 03/03/2016  . Vitamin B 12 deficiency    Past Surgical History:  Procedure Laterality Date  . bone spurs toes Right   . CESAREAN SECTION    . COLONOSCOPY    . endooscopy    . KNEE SURGERY    . rotate cuff lt arm     Social History   Socioeconomic History  . Marital status: Legally Separated    Spouse name: Not on file  . Number of children: 3  . Years of education: Not on file  . Highest education level: Not on file  Occupational History  . Not on file  Social Needs  . Financial resource strain: Not on file  . Food insecurity:    Worry: Not on file    Inability: Not on file  . Transportation needs:    Medical: Not on file    Non-medical: Not on file  Tobacco Use  . Smoking status: Current Every Day Smoker    Packs/day: 1.00    Years: 15.00    Pack years: 15.00    Types: Cigarettes    Start date: 03/13/1997  . Smokeless tobacco: Never Used  . Tobacco comment: "working on it" Was smoking 2.5 packs a day  Substance and Sexual Activity  . Alcohol use: No  . Drug use: No  . Sexual  activity: Yes    Partners: Male    Birth control/protection: None  Lifestyle  . Physical activity:    Days per week: Not on file    Minutes per session: Not on file  . Stress: Not on file  Relationships  . Social connections:    Talks on phone: Not on file    Gets together: Not on file    Attends religious service: Not on file    Active member of club or organization: Not on file    Attends meetings of clubs or organizations: Not on file    Relationship status: Not on file  Other Topics Concern  . Not on file  Social History Narrative  . Not on file   Outpatient Encounter Medications as of 11/15/2018  Medication Sig  . Cholecalciferol (VITAMIN D3) 250 MCG (10000 UT) capsule Take 10,000 Units by mouth daily.  Marland Kitchen ALPRAZolam (XANAX) 1 MG tablet Take 1 tablet (1 mg total) by mouth 4 (four) times daily.  . cariprazine (VRAYLAR) capsule Take 1 capsule (3 mg total) by mouth daily.  . Cyanocobalamin (B-12 PO) Take 1 tablet by mouth daily.  . cyclobenzaprine (FLEXERIL) 5 MG tablet  Take 5 mg by mouth 3 (three) times daily as needed for muscle spasms.  Marland Kitchen ibuprofen (ADVIL,MOTRIN) 200 MG tablet Take 400-600 mg by mouth every 6 (six) hours as needed for mild pain or moderate pain.  Marland Kitchen levothyroxine (SYNTHROID, LEVOTHROID) 25 MCG tablet Take 1 tablet (25 mcg total) by mouth daily before breakfast.  . [DISCONTINUED] calcium-vitamin D (OSCAL WITH D) 250-125 MG-UNIT tablet Take 1 tablet by mouth daily.  . [DISCONTINUED] predniSONE (DELTASONE) 10 MG tablet Take as directed for 12 days.  Daily dose 6,6,5,5,4,4,3,3,2,2,1,1. (Patient not taking: Reported on 11/15/2018)   No facility-administered encounter medications on file as of 11/15/2018.    ALLERGIES: Allergies  Allergen Reactions  . Prozac [Fluoxetine]     Suicidal thoughts  . Topiramate Other (See Comments)    Topamax-dizziness  . Lexapro [Escitalopram Oxalate] Other (See Comments)    Flat affect, No emotions    VACCINATION  STATUS: Immunization History  Administered Date(s) Administered  . Tdap 02/09/2014    HPI Destiny Orr is 38 y.o. female who presents today with a medical history as above. she returning with repeat thyroid function tests for reevaluation.    - She is known to have nodular goiter at least since 2016 when her ultrasound showed 1.3 cm solid right lobe nodule. She had another thyroid ultrasound in September 2017 which showed right lobe 4.8 cm with 1.4 cm nodule reported to be stable in size and without suspicious features , left lobe 4.3 cm with no nodules. - Her thyroid function tests are consistent with primary hypothyroidism. - . She reports unidentified thyroid cancer in her grandmother was diagnosed at age 33. Her repeat thyroid ultrasound on 07/11/2017 showed right lobe measuring 4.4 cm with the previously known nodule stable at 1.5 cm with no suspicious features, left lobe measuring 4.2 cm. The requested biopsy was canceled because of stability of this nodule. - She denies heat/cold intolerance. She denies exposure to neck radiation. Reportedly she was evaluated by ENT with no significant finding to explain her neck symptoms. -She is not currently on antithyroid medications nor on any thyroid supplements.  Review of Systems  Constitutional:  + Weight gain, + fatigue, no subjective hyperthermia, no subjective hypothermia Eyes: no blurry vision, no xerophthalmia ENT: no sore throat, + nodular goiter, ++ intermittent  dysphagia/odynophagia, + hoarseness Musculoskeletal: no muscle/joint aches Skin: no rashes Neurological: no tremors, no numbness, no tingling, no dizziness Psychiatric: + depression, + anxiety  Objective:    BP 116/76   Pulse 79   Ht 5\' 5"  (1.651 m)   Wt 181 lb 6.4 oz (82.3 kg)   BMI 30.19 kg/m   Wt Readings from Last 3 Encounters:  11/15/18 181 lb 6.4 oz (82.3 kg)  10/23/18 176 lb (79.8 kg)  10/18/18 176 lb (79.8 kg)    Physical Exam  Constitutional: + Obese,  not in acute distress, + dysphoric mood  Eyes: PERRLA, EOMI, no exophthalmos ENT: moist mucous membranes, + palpable  thyromegaly, no cervical lymphadenopathy  Musculoskeletal: no gross deformities, strength intact in all four extremities Skin:  + tattoos, moist, warm, no rashes Neurological: no tremor with outstretched hands, Deep tendon reflexes normal in all four extremities.   Diabetic Labs (most recent): Lab Results  Component Value Date   HGBA1C 5.4 01/07/2013   Recent Results (from the past 2160 hour(s))  Cytology - PAP( [place])     Status: Abnormal   Collection Time: 10/08/18 12:00 AM  Result Value Ref Range   Adequacy (A)  Satisfactory for evaluation  endocervical/transformation zone component ABSENT.   Diagnosis (A)     LOW GRADE SQUAMOUS INTRAEPITHELIAL LESION: CIN-1/ HPV (LSIL).   HPV DETECTED (A)     Comment: Normal Reference Range - NOT Detected   Material Submitted CervicoVaginal Pap [ThinPrep Imaged] (A)   CBC     Status: Abnormal   Collection Time: 10/08/18  2:27 PM  Result Value Ref Range   WBC 12.6 (H) 3.4 - 10.8 x10E3/uL   RBC 4.77 3.77 - 5.28 x10E6/uL   Hemoglobin 14.7 11.1 - 15.9 g/dL   Hematocrit 43.2 34.0 - 46.6 %   MCV 91 79 - 97 fL   MCH 30.8 26.6 - 33.0 pg   MCHC 34.0 31.5 - 35.7 g/dL   RDW 12.5 11.7 - 15.4 %    Comment:               **Please note reference interval change**   Platelets 377 150 - 450 x10E3/uL  Comprehensive metabolic panel     Status: Abnormal   Collection Time: 10/08/18  2:27 PM  Result Value Ref Range   Glucose 77 65 - 99 mg/dL   BUN 9 6 - 20 mg/dL   Creatinine, Ser 0.76 0.57 - 1.00 mg/dL   GFR calc non Af Amer 101 >59 mL/min/1.73   GFR calc Af Amer 116 >59 mL/min/1.73   BUN/Creatinine Ratio 12 9 - 23   Sodium 139 134 - 144 mmol/L   Potassium 4.8 3.5 - 5.2 mmol/L   Chloride 97 96 - 106 mmol/L   CO2 26 20 - 29 mmol/L   Calcium 10.2 8.7 - 10.2 mg/dL   Total Protein 6.9 6.0 - 8.5 g/dL   Albumin 4.6 3.5 - 5.5 g/dL     Comment:     **Effective October 21, 2018 Albumin reference**       interval will be changing to:              Age                Female          Female           0 -  7 days        3.6 - 4.9      3.6 - 4.9           8 - 30 days        3.4 - 4.7      3.4 - 4.7           1 -  6 month       3.7 - 4.8      3.7 - 4.8    7 months -  2 years       3.9 - 5.0      3.9 - 5.0           3 -  5 years       4.0 - 5.0      4.0 - 5.0           6 - 12 years       4.1 - 5.0      4.0 - 5.0          13 - 30 years       4.1 - 5.2      3.9 - 5.0          31 - 50 years  4.0 - 5.0      3.8 - 4.8          51 - 60 years       3.8 - 4.9      3.8 - 4.9          61 - 70 years       3.8 - 4.8      3.8 - 4.8          71 - 80 years       3.7 - 4.7      3.7 - 4.7          81 - 89 years       3.6 - 4.6      3.6 - 4.6              >89 years       3.5 - 4.6      3.5 - 4.6    Globulin, Total 2.3 1.5 - 4.5 g/dL   Albumin/Globulin Ratio 2.0 1.2 - 2.2   Bilirubin Total 0.2 0.0 - 1.2 mg/dL   Alkaline Phosphatase 111 39 - 117 IU/L   AST 24 0 - 40 IU/L   ALT 34 (H) 0 - 32 IU/L  TSH     Status: None   Collection Time: 10/08/18  2:27 PM  Result Value Ref Range   TSH 2.010 0.450 - 4.500 uIU/mL  Lipid panel     Status: Abnormal   Collection Time: 10/08/18  2:27 PM  Result Value Ref Range   Cholesterol, Total 171 100 - 199 mg/dL   Triglycerides 200 (H) 0 - 149 mg/dL   HDL 51 >39 mg/dL   VLDL Cholesterol Cal 40 5 - 40 mg/dL   LDL Calculated 80 0 - 99 mg/dL   Chol/HDL Ratio 3.4 0.0 - 4.4 ratio    Comment:                                   T. Chol/HDL Ratio                                             Men  Women                               1/2 Avg.Risk  3.4    3.3                                   Avg.Risk  5.0    4.4                                2X Avg.Risk  9.6    7.1                                3X Avg.Risk 23.4   11.0   Sedimentation rate     Status: None   Collection Time: 10/08/18  2:27 PM  Result Value  Ref Range   Sed Rate 10 0 - 32 mm/hr  Rheumatoid Factor  Status: None   Collection Time: 10/08/18  2:27 PM  Result Value Ref Range   Rhuematoid fact SerPl-aCnc <10.0 0.0 - 13.9 IU/mL  Antinuclear Antib (ANA)     Status: None   Collection Time: 10/08/18  2:27 PM  Result Value Ref Range   Anti Nuclear Antibody(ANA) Negative Negative  CBC with Differential     Status: Abnormal   Collection Time: 10/18/18 10:09 AM  Result Value Ref Range   WBC 11.9 (H) 4.0 - 10.5 K/uL   RBC 4.65 3.87 - 5.11 MIL/uL   Hemoglobin 14.3 12.0 - 15.0 g/dL   HCT 43.6 36.0 - 46.0 %   MCV 93.8 80.0 - 100.0 fL   MCH 30.8 26.0 - 34.0 pg   MCHC 32.8 30.0 - 36.0 g/dL   RDW 12.3 11.5 - 15.5 %   Platelets 292 150 - 400 K/uL   nRBC 0.0 0.0 - 0.2 %   Neutrophils Relative % 70 %   Neutro Abs 8.4 (H) 1.7 - 7.7 K/uL   Lymphocytes Relative 23 %   Lymphs Abs 2.7 0.7 - 4.0 K/uL   Monocytes Relative 5 %   Monocytes Absolute 0.5 0.1 - 1.0 K/uL   Eosinophils Relative 1 %   Eosinophils Absolute 0.2 0.0 - 0.5 K/uL   Basophils Relative 1 %   Basophils Absolute 0.1 0.0 - 0.1 K/uL   Immature Granulocytes 0 %   Abs Immature Granulocytes 0.04 0.00 - 0.07 K/uL    Comment: Performed at [facility], [address], [place], [place] [address]  Comprehensive metabolic panel     Status: Abnormal   Collection Time: 10/18/18 10:09 AM  Result Value Ref Range   Sodium 135 135 - 145 mmol/L   Potassium 4.6 3.5 - 5.1 mmol/L   Chloride 102 98 - 111 mmol/L   CO2 23 22 - 32 mmol/L   Glucose, Bld 147 (H) 70 - 99 mg/dL   BUN 15 6 - 20 mg/dL   Creatinine, Ser 0.88 0.44 - 1.00 mg/dL   Calcium 9.1 8.9 - 10.3 mg/dL   Total Protein 6.9 6.5 - 8.1 g/dL   Albumin 4.0 3.5 - 5.0 g/dL   AST 23 15 - 41 U/L   ALT 29 0 - 44 U/L   Alkaline Phosphatase 70 38 - 126 U/L   Total Bilirubin 0.3 0.3 - 1.2 mg/dL   GFR calc non Af Amer >60 >60 mL/min   GFR calc Af Amer >60 >60 mL/min   Anion gap 10 5 - 15    Comment: Performed at [facility],  [address], [place],  [address]  VITAMIN D 25 Hydroxy (Vit-D Deficiency, Fractures)     Status: Abnormal   Collection Time: 10/18/18 10:09 AM  Result Value Ref Range   Vit D, 25-Hydroxy 21.3 (L) 30.0 - 100.0 ng/mL    Comment: (NOTE) Vitamin D deficiency has been defined by the Institute of Medicine and an Endocrine Society practice guideline as a level of serum 25-OH vitamin D less than 20 ng/mL (1,2). The Endocrine Society went on to further define vitamin D insufficiency as a level between 21 and 29 ng/mL (2). 1. IOM (Institute of Medicine). 2010. Dietary reference   intakes for calcium and D. [place]: The   [employer]. 2. Holick MF, Binkley , Bischoff-Ferrari HA, et al.   Evaluation, treatment, and prevention of vitamin D   deficiency: an Endocrine Society clinical practice   guideline. JCEM. 2011 Jul; 96(7):1911-30. Performed At: [facility]  [facility] [place], [place] [id number] Rush Farmer MD [id number]   POCT urine pregnancy     Status: None   Collection Time: 10/18/18 12:04 PM  Result Value Ref Range   Preg Test, Ur Negative Negative  T4, free     Status: Abnormal   Collection Time: 11/04/18 11:25 AM  Result Value Ref Range   Free T4 0.77 (L) 0.82 - 1.77 ng/dL    Comment: (NOTE) Biotin ingestion may interfere with free T4 tests. If the results are inconsistent with the TSH level, previous test results, or the clinical presentation, then consider biotin interference. If needed, order repeat testing after stopping biotin. Performed at [facility], [place] [address], [place], [place] [address]   Catecholamines,Ur.,Free,24 Hh     Status: None   Collection Time: 11/05/18 10:28 AM  Result Value Ref Range   Epinephrine, Rand Ur 6 Undefined ug/L    Comment: (NOTE) This test was developed and its performance characteristics determined by [facility]. It has not been cleared or approved by the Food and Drug Administration.     Epinephrine, U, 24Hr 16 0 - 20 ug/24 hr   Norepinephrine, Rand Ur 14 Undefined ug/L    Comment: (NOTE) This test was developed and its performance characteristics determined by [facility]. It has not been cleared or approved by the Food and Drug Administration.    Norepinephrine,U,24H 38 0 - 135 ug/24 hr   Dopamine, Rand Ur 88 Undefined ug/L    Comment: (NOTE) This test was developed and its performance characteristics determined by [facility]. It has not been cleared or approved by the Food and Drug Administration.    Dopamine, Ur, 24Hr 238 0 - 510 ug/24 hr    Comment: (NOTE) Performed At: [facility] [place], [place] [id number] Rush Farmer MD [id number]    Total Volume 2,700     Comment: Performed at [facility], [address] [place], [place] [address]  Metanephrines, urine, 24 hour     Status: None   Collection Time: 11/07/18  9:00 AM  Result Value Ref Range   Normetanephrine, Ur 90 Undefined ug/L    Comment: (NOTE) This test was developed and its performance characteristics determined by [facility]. It has not been cleared or approved by the Food and Drug Administration.    Normetanephrine, 24H Ur 333 82 - 500 ug/24 hr    Comment:      (Hypertensive) >17 years 11 months:    110   1050    Metaneph Total, Ur 46 Undefined ug/L    Comment: (NOTE) This test was developed and its performance characteristics determined by [facility]. It has not been cleared or approved by the Food and Drug Administration.    Metanephrines, 24H Ur 170 45 - 290 ug/24 hr    Comment: (NOTE)     (Hypertensive) >17 years 11 months:     51 -  49 Performed At: [facility] [place], [place] [id number] Rush Farmer MD [id number]    Total Volume 3,700     Comment: Performed at [facility], [address] [place], [place] [address]      Assessment & Plan:   1. Nodular goiter - Review of her 3 sonograms is consistent with stable nodule on the  right lower her thyroid currently at 1.5 cm with no suspicious features.  Her last thyroid ultrasound was in October 2018.  Given her report of an identified thyroid malignancy in a family member,  she will be offered surveillance thyroid ultrasound before her next visit in 3 months.    2.  Hypothyroidism-new diagnosis I discussed and initiated levothyroxine 25 mcg p.o. every morning.    - We discussed about the correct intake of her thyroid hormone, on empty stomach at fasting, with water, separated by at least 30 minutes from breakfast and other medications,  and separated by more than 4 hours from calcium, iron, multivitamins, acid reflux medications (PPIs). -Patient is made aware of the fact that thyroid hormone replacement is needed for life, dose to be adjusted by periodic monitoring of thyroid function tests.  -Her work-up with 24-hour urine metanephrines and catecholamines was negative for pheochromocytoma.  -She is currently on treatment for vitamin D deficiency.  - I advised patient to maintain close follow up with Eunice Blase, MD for primary care needs. Follow up plan: Return in about 3 months (around 02/13/2019) for Follow up with Pre-visit Labs, Thyroid / Neck Ultrasound.  Glade Lloyd, MD [facility] [place] [facility] Phone: [phone]  Fax: [phone]   11/15/2018, 5:39 PM This note was partially dictated with voice recognition software. Similar sounding words can be transcribed inadequately or may not  be corrected upon review.

## 2018-11-19 ENCOUNTER — Other Ambulatory Visit: Payer: Self-pay | Admitting: Obstetrics & Gynecology

## 2018-11-19 ENCOUNTER — Telehealth: Payer: Self-pay

## 2018-11-19 ENCOUNTER — Telehealth: Payer: Self-pay | Admitting: Obstetrics & Gynecology

## 2018-11-19 MED ORDER — DESOGESTREL-ETHINYL ESTRADIOL 0.15-0.02/0.01 MG (21/5) PO TABS: 1.0000 | ORAL_TABLET | Freq: Every day | ORAL | 11 refills | Status: DC

## 2018-11-19 NOTE — Telephone Encounter (Signed)
Destiny Orr is calling stating that since she started the Levothyroxine she is having dizziness headaches and very sleepy, please advise?

## 2018-11-19 NOTE — Telephone Encounter (Signed)
done

## 2018-11-19 NOTE — Telephone Encounter (Signed)
Patient informed BCP sent in. Patient asked if it was sent in for [facility] or heavy periods. Informed patient I did not see documentation of either but she could check with her pharmacy to see how it goes through.  Pt verbalized understanding.

## 2018-11-19 NOTE — Telephone Encounter (Signed)
These symptoms are nonspecific.  Sleepiness is the opposite of what we expect from levothyroxine treatment.  Since her levothyroxine is the lowest dose, I suggest she stay on it until we may measure again.  It takes time for her body to get used to levothyroxine treatment , symptoms may continue to improve. 

## 2018-11-19 NOTE — Telephone Encounter (Signed)
Patient called stating that she was told to call our office when she started her period, to see if Dr. Elonda Husky could place her on a birth control that could help her with her period.

## 2018-11-26 ENCOUNTER — Ambulatory Visit (HOSPITAL_COMMUNITY): Payer: Medicare Other | Admitting: Psychiatry

## 2018-11-27 ENCOUNTER — Other Ambulatory Visit: Payer: Self-pay | Admitting: "Endocrinology

## 2018-11-27 DIAGNOSIS — E049 Nontoxic goiter, unspecified: Secondary | ICD-10-CM

## 2018-12-10 ENCOUNTER — Ambulatory Visit (HOSPITAL_COMMUNITY): Payer: Medicare Other | Admitting: Psychiatry

## 2018-12-20 ENCOUNTER — Ambulatory Visit (HOSPITAL_COMMUNITY): Payer: Medicare Other | Admitting: Psychiatry

## 2018-12-23 ENCOUNTER — Other Ambulatory Visit: Payer: Self-pay

## 2018-12-23 ENCOUNTER — Ambulatory Visit (INDEPENDENT_AMBULATORY_CARE_PROVIDER_SITE_OTHER): Payer: Medicare Other | Admitting: Psychiatry

## 2018-12-23 ENCOUNTER — Encounter (HOSPITAL_COMMUNITY): Payer: Self-pay | Admitting: Psychiatry

## 2018-12-23 VITALS — BP 134/90 | HR 102 | Ht 65.0 in | Wt 178.0 lb

## 2018-12-23 DIAGNOSIS — I498 Other specified cardiac arrhythmias: Secondary | ICD-10-CM | POA: Diagnosis not present

## 2018-12-23 DIAGNOSIS — F17210 Nicotine dependence, cigarettes, uncomplicated: Secondary | ICD-10-CM

## 2018-12-23 DIAGNOSIS — F332 Major depressive disorder, recurrent severe without psychotic features: Secondary | ICD-10-CM | POA: Diagnosis not present

## 2018-12-23 DIAGNOSIS — R5382 Chronic fatigue, unspecified: Secondary | ICD-10-CM | POA: Diagnosis not present

## 2018-12-23 DIAGNOSIS — E039 Hypothyroidism, unspecified: Secondary | ICD-10-CM

## 2018-12-23 MED ORDER — ALPRAZOLAM 1 MG PO TABS: 1.0000 mg | ORAL_TABLET | Freq: Four times a day (QID) | ORAL | 2 refills | Status: DC

## 2018-12-23 MED ORDER — CARIPRAZINE HCL 1.5 MG PO CAPS: 1.5000 mg | ORAL_CAPSULE | Freq: Every day | ORAL | 2 refills | Status: DC
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
## 2018-12-24 ENCOUNTER — Telehealth (INDEPENDENT_AMBULATORY_CARE_PROVIDER_SITE_OTHER): Payer: Self-pay

## 2018-12-24 NOTE — Telephone Encounter (Signed)
I called patient to screen for covid-19 prior to tomorrow's appointment with Dr. Junius Roads. Answered "no" to all questions.

## 2018-12-25 ENCOUNTER — Encounter (INDEPENDENT_AMBULATORY_CARE_PROVIDER_SITE_OTHER): Payer: Self-pay | Admitting: Family Medicine

## 2018-12-25 ENCOUNTER — Ambulatory Visit (INDEPENDENT_AMBULATORY_CARE_PROVIDER_SITE_OTHER): Payer: Medicare Other | Admitting: Family Medicine

## 2018-12-25 ENCOUNTER — Other Ambulatory Visit: Payer: Self-pay

## 2018-12-25 ENCOUNTER — Ambulatory Visit (HOSPITAL_COMMUNITY): Payer: Medicare Other | Admitting: Psychiatry

## 2018-12-25 DIAGNOSIS — R200 Anesthesia of skin: Secondary | ICD-10-CM

## 2018-12-25 MED ORDER — METHYLPREDNISOLONE 4 MG PO TBPK: ORAL_TABLET | ORAL | 0 refills | Status: DC

## 2018-12-25 NOTE — Progress Notes (Signed)
Office Visit Note   Patient: Destiny Orr           Date of Birth: 24-Sep-1981           MRN: [id number] Visit Date: 12/25/2018 Requested by: Eunice Blase, MD [address] [place], [place] [address] PCP: Eunice Blase, MD  Subjective: Chief Complaint  Patient presents with  . Numbness/pain in fingers IV-V bil hands x 4 days    HPI: She is here with bilateral hand numbness.  Symptoms started about 4 days ago, no injury.  She cannot think of anything that would have caused this.  Fourth and fifth fingers of both hands are the worst, but she feels some weakness in the thumb and index fingers of both hands as well.  Denies any neck pain, has not had any recent trauma.  She does note that about 6 weeks ago she was started on thyroid medication.  She has not had labs rechecked yet.              ROS: Denies fevers, chills, night sweats.  Denies headaches or vision change.  All other systems were reviewed and are negative.  Objective: Vital Signs: There were no vitals taken for this visit.  Physical Exam:  General:  Alert and oriented, in no acute distress. Pulm:  Breathing unlabored. Psy:  Normal mood, congruent affect. Skin: No rash on her skin. Arms: No atrophy of her hand muscles.  Upper extremity strength and reflexes are normal.  She has some numbness to light touch of the fourth and fifth fingers of both hands mostly on the palmar surface.  Positive Tinel's at the ulnar groove bilaterally, negative ulnar compression test at the elbows.  Negative Tinel's at the carpal tunnel and negative Phalen's test bilaterally.  Imaging: None today.  Assessment & Plan: 1.  Bilateral hand numbness, etiology uncertain but could be related to hypothyroidism -Medrol Dosepak, if numbness is not better in 1 to 2 weeks then I would recommend having her thyroid labs rechecked.  It is too early to order nerve conduction studies.  Another consideration would be elbow splints for cubital tunnel syndrome or  possibly carpal tunnel night splints.     Procedures: No procedures performed  No notes on file     PMFS History: Patient Active Problem List   Diagnosis Date Noted  . Hypothyroidism 11/15/2018  . Abnormal Papanicolaou smear of cervix with positive human papilloma virus (HPV) test 10/11/2018  . Goiter 10/08/2018  . Breast tenderness 10/08/2018  . Mass of upper outer quadrant of right breast 10/08/2018  . Encounter for gynecological examination with Papanicolaou smear of cervix 10/08/2018  . Current smoker 07/03/2017  . Leukocytosis 06/18/2017  . Thyroid nodule 12/26/2016  . Neck injury, initial encounter 10/20/2016  . Concussion with loss of consciousness 10/20/2016  . Injury of left shoulder 10/20/2016  . Sinus tachycardia 10/16/2016  . Post concussion syndrome 10/03/2016  . Intractable headache 10/03/2016  . Dizziness 10/03/2016  . Blurry vision, right eye 10/03/2016  . Nodular goiter 06/18/2016  . Fibromyalgia syndrome 06/16/2016  . Raynaud's syndrome without gangrene 06/16/2016  . Numbness and tingling sensation of skin 06/16/2016  . B12 deficiency 06/16/2016  . Dizziness and giddiness 06/16/2016  . Pelvic pain in female 03/03/2016  . Tick bite 03/03/2016  . POTS (postural orthostatic tachycardia syndrome) 02/05/2016  . Abnormal uterine bleeding (AUB) 12/09/2014  . Depression 11/26/2014  . Dysmenorrhea 07/15/2014  . Irregular menstrual bleeding 07/15/2014  . Back pain, chronic 04/02/2013  .  Chronic fatigue and malaise 12/24/2012  . Anxiety 12/24/2012  . PTSD (post-traumatic stress disorder) 12/24/2012  . ADD (attention deficit disorder) 12/24/2012  . Contraception 12/24/2012  . Hx of migraine headaches 12/24/2012   Past Medical History:  Diagnosis Date  . Abnormal Papanicolaou smear of cervix with positive human papilloma virus (HPV) test 10/11/2018   LSIL +HPV, get colpo___  . Abnormal uterine bleeding (AUB) 12/09/2014  . Anxiety   . Arthritis   .  Depression   . DJD (degenerative joint disease)   . Dumping syndrome   . Dysmenorrhea 07/15/2014  . Fatigue 12/24/2012  . Fibromyalgia diagnosed April 2016  . Headache   . Hx of migraine headaches 12/24/2012  . Inappropriate sinus tachycardia   . Irregular menstrual bleeding 07/15/2014  . Pelvic pain in female 03/03/2016  . Pneumothorax, spontaneous, tension   . Post concussion syndrome   . POTS (postural orthostatic tachycardia syndrome)   . PTSD (post-traumatic stress disorder)   . Scoliosis   . Thyroid disease   . Tick bite 03/03/2016  . Vitamin B 12 deficiency     Family History  Problem Relation Age of Onset  . Hypertension Father   . Atrial fibrillation Father   . Alcohol abuse Father   . Cancer Maternal Grandmother        skin   . Heart disease Maternal Grandmother   . Other Maternal Grandmother        had thyroid removed  . Breast cancer Maternal Grandmother   . Heart disease Paternal Grandfather   . COPD Paternal Grandfather   . Hypertension Paternal Grandfather   . Diabetes Paternal Grandfather   . Stroke Paternal Grandfather   . Cancer Maternal Grandfather        bladder,lung  . Anxiety disorder Maternal Aunt   . Anxiety disorder Maternal Uncle   . Bipolar disorder Maternal Uncle   . Breast cancer Maternal Uncle        CML  . Leukemia Other   . Colon cancer Other        lung-2 maternal great uncles    Past Surgical History:  Procedure Laterality Date  . bone spurs toes Right   . CESAREAN SECTION    . COLONOSCOPY    . endooscopy    . KNEE SURGERY    . rotate cuff lt arm     Social History   Occupational History  . Not on file  Tobacco Use  . Smoking status: Current Every Day Smoker    Packs/day: 1.00    Years: 15.00    Pack years: 15.00    Types: Cigarettes    Start date: 03/13/1997  . Smokeless tobacco: Never Used  . Tobacco comment: "working on it" Was smoking 2.5 packs a day  Substance and Sexual Activity  . Alcohol use: No  . Drug use: No   . Sexual activity: Yes    Partners: Male    Birth control/protection: None

## 2018-12-30 ENCOUNTER — Encounter (INDEPENDENT_AMBULATORY_CARE_PROVIDER_SITE_OTHER): Payer: Self-pay | Admitting: Family Medicine

## 2019-01-01 ENCOUNTER — Telehealth (INDEPENDENT_AMBULATORY_CARE_PROVIDER_SITE_OTHER): Payer: Self-pay | Admitting: Family Medicine

## 2019-01-01 NOTE — Telephone Encounter (Signed)
Patient called to f/u on her MyChart message to Dr. Junius Roads.  CB#[phone].  Thank you.

## 2019-01-01 NOTE — Telephone Encounter (Signed)
Holding for you...  I called patient. She states that she is getting scared, that her entire right arm feels like it is in a bucket of ice water and she has swelling in her hand. I explained that the My Chart message had been sent to Dr. Junius Roads and is awaiting his response. I did explain that we have changed our hours due to COVID-19 and Dr. Junius Roads is not available in the office this afternoon. Patient expressed understanding and would be thankful for a return call on 01/02/2019.

## 2019-01-02 ENCOUNTER — Other Ambulatory Visit (HOSPITAL_COMMUNITY)
Admission: RE | Admit: 2019-01-02 | Discharge: 2019-01-02 | Disposition: A | Discharge status: Home / Self Care | Payer: Medicare Other | Source: Ambulatory Visit | Attending: Family Medicine | Admitting: Family Medicine

## 2019-01-02 ENCOUNTER — Other Ambulatory Visit: Payer: Self-pay

## 2019-01-02 ENCOUNTER — Other Ambulatory Visit (INDEPENDENT_AMBULATORY_CARE_PROVIDER_SITE_OTHER): Payer: Self-pay | Admitting: Family Medicine

## 2019-01-02 DIAGNOSIS — R200 Anesthesia of skin: Secondary | ICD-10-CM

## 2019-01-02 LAB — TSH: TSH: 1.619 u[IU]/mL (ref 0.350–4.500)

## 2019-01-02 LAB — T4, FREE: Free T4: 0.77 ng/dL — ABNORMAL LOW (ref 0.82–1.77)

## 2019-01-02 NOTE — Telephone Encounter (Signed)
I have ordered thyroid labs to be drawn.  Can she go somewhere in the [facility] system to have them drawn?  If the labs are normal, then we'll try to get an MRI of the neck approved, but I'm afraid the imaging facilities might not be able to do it right now.  Her MyChart message is apparently sitting in Terri's inbox.  Is there a way to get Terri's messages routed to someone else?

## 2019-01-02 NOTE — Telephone Encounter (Signed)
Please advise. I s/w patient and she states she sent my chart message to you but has not heard back yet. Any suggestions?

## 2019-01-02 NOTE — Telephone Encounter (Signed)
I s/w patient. She will go to [facility] to have labs drawn. Advised her about the scan. Not sure what happened to [employer], dont see it in Terri's box anywhere but I told her we would be in touch with her about labs once results back.

## 2019-01-03 ENCOUNTER — Telehealth (INDEPENDENT_AMBULATORY_CARE_PROVIDER_SITE_OTHER): Payer: Self-pay | Admitting: Family Medicine

## 2019-01-03 DIAGNOSIS — R200 Anesthesia of skin: Secondary | ICD-10-CM

## 2019-01-03 LAB — T3: T3, Total: 143 ng/dL (ref 71–180)

## 2019-01-03 LAB — T4: T4, Total: 9.9 ug/dL (ref 4.5–12.0)

## 2019-01-03 MED ORDER — PREGABALIN 75 MG PO CAPS: ORAL_CAPSULE | ORAL | 3 refills | Status: DC

## 2019-01-03 NOTE — Telephone Encounter (Signed)
Thyroid labs look pretty good.  Free T4 is slightly low, but TSH is in good range.  Would recheck in a few months.  If free T4 still not normal, then increase synthroid dosage slightly.  Will order cervical MRI scan to investigate numbness.

## 2019-01-03 NOTE — Addendum Note (Signed)
Addended by: Hortencia Pilar on: 01/03/2019 09:45 AM   Modules accepted: Orders

## 2019-01-31 ENCOUNTER — Other Ambulatory Visit (HOSPITAL_COMMUNITY): Admission: RE | Admit: 2019-01-31 | Payer: Medicare Other | Source: Ambulatory Visit | Admitting: *Deleted

## 2019-02-03 ENCOUNTER — Ambulatory Visit (HOSPITAL_COMMUNITY): Admission: RE | Admit: 2019-02-03 | Payer: Medicare Other | Source: Ambulatory Visit

## 2019-02-12 ENCOUNTER — Ambulatory Visit (HOSPITAL_COMMUNITY): Payer: Medicare Other

## 2019-02-13 ENCOUNTER — Ambulatory Visit: Payer: Medicare Other | Admitting: "Endocrinology

## 2019-02-25 ENCOUNTER — Ambulatory Visit (INDEPENDENT_AMBULATORY_CARE_PROVIDER_SITE_OTHER): Payer: Medicare Other | Admitting: Psychiatry

## 2019-02-25 ENCOUNTER — Other Ambulatory Visit: Payer: Self-pay

## 2019-02-25 ENCOUNTER — Encounter (HOSPITAL_COMMUNITY): Payer: Self-pay | Admitting: Psychiatry

## 2019-02-25 DIAGNOSIS — F332 Major depressive disorder, recurrent severe without psychotic features: Secondary | ICD-10-CM

## 2019-02-25 MED ORDER — CARIPRAZINE HCL 1.5 MG PO CAPS: 1.5000 mg | ORAL_CAPSULE | Freq: Every day | ORAL | 2 refills | Status: DC

## 2019-02-25 MED ORDER — VORTIOXETINE HBR 5 MG PO TABS: 5.0000 mg | ORAL_TABLET | Freq: Every day | ORAL | 2 refills | Status: DC

## 2019-02-25 MED ORDER — ALPRAZOLAM 1 MG PO TABS: 1.0000 mg | ORAL_TABLET | Freq: Four times a day (QID) | ORAL | 2 refills | Status: DC
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
## 2019-03-03 ENCOUNTER — Encounter (INDEPENDENT_AMBULATORY_CARE_PROVIDER_SITE_OTHER): Payer: Self-pay | Admitting: Family Medicine

## 2019-03-05 ENCOUNTER — Other Ambulatory Visit (HOSPITAL_COMMUNITY)
Admission: RE | Admit: 2019-03-05 | Discharge: 2019-03-05 | Disposition: A | Discharge status: Home / Self Care | Payer: Medicare Other | Source: Ambulatory Visit | Attending: "Endocrinology | Admitting: "Endocrinology

## 2019-03-05 ENCOUNTER — Other Ambulatory Visit: Payer: Self-pay

## 2019-03-05 ENCOUNTER — Ambulatory Visit (HOSPITAL_COMMUNITY)
Admission: RE | Admit: 2019-03-05 | Discharge: 2019-03-05 | Disposition: A | Discharge status: Home / Self Care | Payer: Medicare Other | Source: Ambulatory Visit | Attending: "Endocrinology | Admitting: "Endocrinology

## 2019-03-05 DIAGNOSIS — E039 Hypothyroidism, unspecified: Secondary | ICD-10-CM | POA: Insufficient documentation

## 2019-03-05 DIAGNOSIS — E049 Nontoxic goiter, unspecified: Secondary | ICD-10-CM | POA: Diagnosis not present

## 2019-03-05 DIAGNOSIS — R200 Anesthesia of skin: Secondary | ICD-10-CM | POA: Insufficient documentation

## 2019-03-05 DIAGNOSIS — E042 Nontoxic multinodular goiter: Secondary | ICD-10-CM | POA: Diagnosis not present

## 2019-03-05 LAB — TSH: TSH: 1.48 u[IU]/mL (ref 0.350–4.500)

## 2019-03-05 LAB — T4, FREE: Free T4: 0.78 ng/dL — ABNORMAL LOW (ref 0.82–1.77)

## 2019-03-06 LAB — T3 UPTAKE: T3 Uptake Ratio: 18 % — ABNORMAL LOW (ref 24–39)

## 2019-03-06 LAB — T4: T4, Total: 9.6 ug/dL (ref 4.5–12.0)

## 2019-03-06 LAB — VITAMIN D 25 HYDROXY (VIT D DEFICIENCY, FRACTURES): Vit D, 25-Hydroxy: 28.7 ng/mL — ABNORMAL LOW (ref 30.0–100.0)

## 2019-03-07 ENCOUNTER — Other Ambulatory Visit: Payer: Self-pay

## 2019-03-07 ENCOUNTER — Ambulatory Visit
Admission: RE | Admit: 2019-03-07 | Discharge: 2019-03-07 | Disposition: A | Discharge status: Home / Self Care | Payer: Medicare Other | Source: Ambulatory Visit | Attending: Family Medicine | Admitting: Family Medicine

## 2019-03-07 DIAGNOSIS — R200 Anesthesia of skin: Secondary | ICD-10-CM

## 2019-03-07 DIAGNOSIS — M542 Cervicalgia: Secondary | ICD-10-CM | POA: Diagnosis not present

## 2019-03-10 ENCOUNTER — Telehealth: Payer: Self-pay | Admitting: Family Medicine

## 2019-03-10 DIAGNOSIS — R200 Anesthesia of skin: Secondary | ICD-10-CM

## 2019-03-10 NOTE — Telephone Encounter (Signed)
Cervical spine MRI shows no nerve or spinal cord compression.  I recommend ordering nerve conduction studies to look for carpal tunnel syndrome or other nerve impingement.  Thyroid function is not totally normal.  T4 is low.  May need to increase synthroid dosage to 50 mcg daily.

## 2019-03-11 ENCOUNTER — Ambulatory Visit: Payer: Medicare Other | Admitting: Family Medicine

## 2019-03-17 ENCOUNTER — Ambulatory Visit: Payer: Medicare Other | Admitting: Family Medicine

## 2019-03-19 ENCOUNTER — Other Ambulatory Visit: Payer: Self-pay

## 2019-03-19 ENCOUNTER — Ambulatory Visit (INDEPENDENT_AMBULATORY_CARE_PROVIDER_SITE_OTHER): Payer: Medicare Other | Admitting: "Endocrinology

## 2019-03-19 ENCOUNTER — Encounter: Payer: Self-pay | Admitting: "Endocrinology

## 2019-03-19 DIAGNOSIS — E049 Nontoxic goiter, unspecified: Secondary | ICD-10-CM

## 2019-03-19 DIAGNOSIS — E039 Hypothyroidism, unspecified: Secondary | ICD-10-CM | POA: Diagnosis not present

## 2019-03-19 DIAGNOSIS — E559 Vitamin D deficiency, unspecified: Secondary | ICD-10-CM | POA: Diagnosis not present

## 2019-03-19 MED ORDER — LEVOTHYROXINE SODIUM 50 MCG PO TABS: 50.0000 ug | ORAL_TABLET | Freq: Every day | ORAL | 6 refills | Status: DC

## 2019-03-19 NOTE — Progress Notes (Signed)
These symptoms are nonspecific.  Sleepiness is the opposite of what we expect from levothyroxine treatment.  Since her levothyroxine is the lowest dose, I suggest she stay on it until we may measure again.  It takes time for her body to get used to levothyroxine treatment , symptoms may continue to improve.

## 2019-03-19 NOTE — Progress Notes (Signed)
03/19/2019                                  Endocrinology Telehealth Visit Follow up Note -During COVID -19 Pandemic  I connected with Destiny Orr on 03/19/2019   by telephone and verified that I am speaking with the correct person using two identifiers. Destiny Orr, 07-27-1981. she has verbally consented to this visit. All issues noted in this document were discussed and addressed. The format was not optimal for physical exam.     Subjective:    Patient ID: Destiny Orr, female    DOB: [birth year], PCP Eunice Blase, MD   Past Medical History:  Diagnosis Date  . Abnormal Papanicolaou smear of cervix with positive human papilloma virus (HPV) test 10/11/2018   LSIL +HPV, get colpo___  . Abnormal uterine bleeding (AUB) 12/09/2014  . Anxiety   . Arthritis   . Depression   . DJD (degenerative joint disease)   . Dumping syndrome   . Dysmenorrhea 07/15/2014  . Fatigue 12/24/2012  . Fibromyalgia diagnosed April 2016  . Headache   . Hx of migraine headaches 12/24/2012  . Inappropriate sinus tachycardia   . Irregular menstrual bleeding 07/15/2014  . Pelvic pain in female 03/03/2016  . Pneumothorax, spontaneous, tension   . Post concussion syndrome   . POTS (postural orthostatic tachycardia syndrome)   . PTSD (post-traumatic stress disorder)   . Scoliosis   . Thyroid disease   . Tick bite 03/03/2016  . Vitamin B 12 deficiency    Past Surgical History:  Procedure Laterality Date  . bone spurs toes Right   . CESAREAN SECTION    . COLONOSCOPY    . endooscopy    . KNEE SURGERY    . rotate cuff lt arm     Social History   Socioeconomic History  . Marital status: Legally Separated    Spouse name: Not on file  . Number of children: 3  . Years of education: Not on file  . Highest education level: Not on file  Occupational History  . Not on file  Social Needs  . Financial resource strain: Not on file  . Food insecurity    Worry: Not on file    Inability: Not on file  . Transportation  needs    Medical: Not on file    Non-medical: Not on file  Tobacco Use  . Smoking status: Current Every Day Smoker    Packs/day: 1.00    Years: 15.00    Pack years: 15.00    Types: Cigarettes    Start date: 03/13/1997  . Smokeless tobacco: Never Used  . Tobacco comment: "working on it" Was smoking 2.5 packs a day  Substance and Sexual Activity  . Alcohol use: No  . Drug use: No  . Sexual activity: Yes    Partners: Male    Birth control/protection: None  Lifestyle  . Physical activity    Days per week: Not on file    Minutes per session: Not on file  . Stress: Not on file  Relationships  . Social Herbalist on phone: Not on file    Gets together: Not on file    Attends religious service: Not on file    Active member of club or organization: Not on file    Attends meetings of clubs or organizations: Not on file    Relationship status: Not on file  Other Topics Concern  . Not on file  Social History Narrative  . Not on file   Outpatient Encounter Medications as of 03/19/2019  Medication Sig  . ALPRAZolam (XANAX) 1 MG tablet Take 1 tablet (1 mg total) by mouth 4 (four) times daily.  . cariprazine (VRAYLAR) capsule Take 1 capsule (1.5 mg total) by mouth daily.  . Cholecalciferol (VITAMIN D3) 250 MCG (10000 UT) capsule Take 10,000 Units by mouth daily.  . Cyanocobalamin (B-12 PO) Take 1 tablet by mouth daily.  . cyclobenzaprine (FLEXERIL) 5 MG tablet Take 5 mg by mouth 3 (three) times daily as needed for muscle spasms.  Marland Kitchen desogestrel-ethinyl estradiol (KARIVA,AZURETTE,MIRCETTE) 0.15-0.02/0.01 MG (21/5) tablet Take 1 tablet by mouth daily.  Marland Kitchen ibuprofen (ADVIL,MOTRIN) 200 MG tablet Take 400-600 mg by mouth every 6 (six) hours as needed for mild pain or moderate pain.  Marland Kitchen levothyroxine (SYNTHROID) 50 MCG tablet Take 1 tablet (50 mcg total) by mouth daily before breakfast.  . methylPREDNISolone (MEDROL DOSEPAK) 4 MG TBPK tablet As directed for 6 days.  . pregabalin  (LYRICA) 75 MG capsule 1 PO q HS, may increase to 1 PO BID if needed  . vortioxetine HBr (TRINTELLIX) 5 MG TABS tablet Take 1 tablet (5 mg total) by mouth daily.  . [DISCONTINUED] levothyroxine (SYNTHROID, LEVOTHROID) 25 MCG tablet Take 1 tablet (25 mcg total) by mouth daily before breakfast.   No facility-administered encounter medications on file as of 03/19/2019.    ALLERGIES: Allergies  Allergen Reactions  . Prozac [Fluoxetine]     Suicidal thoughts  . Topiramate Other (See Comments)    Topamax-dizziness  . Lexapro [Escitalopram Oxalate] Other (See Comments)    Flat affect, No emotions    VACCINATION STATUS: Immunization History  Administered Date(s) Administered  . Tdap 02/09/2014    HPI Destiny Orr is 38 y.o. female who presents today with a medical history as above. she  Is being engaged in telehealth for f/u of hypothyroidism and nodular goiter. - She is known to have nodular goiter at least since 2016 when her ultrasound showed 1.3 cm solid right lobe nodule. She had another thyroid ultrasound in September 2017 which showed right lobe 4.8 cm with 1.4 cm nodule reported to be stable in size and without suspicious features , left lobe 4.3 cm with no nodules. -She was initiated on low-dose levothyroxine for mild hypothyroidism during her last visit.  She continues to tolerate this medication with symptomatic improvement.  She has no new complaints today. - . She reports unidentified thyroid cancer in her grandmother was diagnosed at age 22. Her repeat thyroid ultrasound on 07/11/2017 showed right lobe measuring 4.4 cm with the previously known nodule stable at 1.5 cm with no suspicious features, left lobe measuring 4.2 cm. The requested biopsy was canceled because of stability of this nodule. - She denies heat/cold intolerance. She denies exposure to neck radiation. Reportedly she was evaluated by ENT with no significant finding to explain her neck symptoms. -She is not currently on  antithyroid medications nor on any thyroid supplements.  Review of Systems .  Limited as above.  Objective:    There were no vitals taken for this visit.  Wt Readings from Last 3 Encounters:  11/15/18 181 lb 6.4 oz (82.3 kg)  10/23/18 176 lb (79.8 kg)  10/18/18 176 lb (79.8 kg)     Diabetic Labs (most recent): Lab Results  Component Value Date   HGBA1C 5.4 01/07/2013   Recent Results (from the past 2160 hour(s))  T4     Status: None   Collection Time: 01/02/19  2:28 PM  Result Value Ref Range   T4, Total 9.9 4.5 - 12.0 ug/dL    Comment: (NOTE) Performed At: [facility] [place], [place] [id number] Rush Farmer MD [id number]   T3     Status: None   Collection Time: 01/02/19  2:28 PM  Result Value Ref Range   T3, Total 143 71 - 180 ng/dL    Comment: (NOTE) Performed At: [facility] [place], [place] [id number] Rush Farmer MD [id number]   TSH     Status: None   Collection Time: 01/02/19  2:29 PM  Result Value Ref Range   TSH 1.619 0.350 - 4.500 uIU/mL    Comment: Performed by a 3rd Generation assay with a functional sensitivity of <=0.01 uIU/mL. Performed at [facility], [address], [place], [place] [address]   T4, free     Status: Abnormal   Collection Time: 01/02/19  2:29 PM  Result Value Ref Range   Free T4 0.77 (L) 0.82 - 1.77 ng/dL    Comment: (NOTE) Biotin ingestion may interfere with free T4 tests. If the results are inconsistent with the TSH level, previous test results, or the clinical presentation, then consider biotin interference. If needed, order repeat testing after stopping biotin. Performed at [facility], [place] [address] [place], [place] [address]   T4, free     Status: Abnormal   Collection Time: 03/05/19  3:00 PM  Result Value Ref Range   Free T4 0.78 (L) 0.82 - 1.77 ng/dL    Comment: (NOTE) Biotin ingestion may interfere with free T4 tests. If the results  are inconsistent with the TSH level, previous test results, or the clinical presentation, then consider biotin interference. If needed, order repeat testing after stopping biotin. Performed at [facility], [place] [address], [place], [place] [address]   T4     Status: None   Collection Time: 03/05/19  3:06 PM  Result Value Ref Range   T4, Total 9.6 4.5 - 12.0 ug/dL    Comment: (NOTE) Performed At: [facility] [place], [place] [id number] Rush Farmer MD [id number]   T3 uptake     Status: Abnormal   Collection Time: 03/05/19  3:06 PM  Result Value Ref Range   T3 Uptake Ratio 18 (L) 24 - 39 %  TSH     Status: None   Collection Time: 03/05/19  3:06 PM  Result Value Ref Range   TSH 1.480 0.350 - 4.500 uIU/mL    Comment: Performed by a 3rd Generation assay with a functional sensitivity of <=0.01 uIU/mL. Performed at [facility], [address], [place], [place] [address]   VITAMIN D 25 Hydroxy (Vit-D Deficiency, Fractures)     Status: Abnormal   Collection Time: 03/05/19  3:06 PM  Result Value Ref Range   Vit D, 25-Hydroxy 28.7 (L) 30.0 - 100.0 ng/mL    Comment: (NOTE) Vitamin D deficiency has been defined by the [place] practice guideline as a level of serum 25-OH vitamin D less than 20 ng/mL (1,2). The Endocrine Society went on to further define vitamin D insufficiency as a level between 21 and 29 ng/mL (2). 1. IOM (Institute of Medicine). 2010. Dietary reference   intakes for calcium and D. [place]: The   [employer]. 2. Holick MF, Binkley Whitehouse, Bischoff-Ferrari HA, et  al.   Evaluation, treatment, and prevention of vitamin D   deficiency: an Endocrine Society clinical practice   guideline. JCEM. 2011 Jul; 96(7):1911-30. Performed At: [facility] [place], [place] [id number] Rush Farmer MD [id number]       Assessment & Plan:   1. Nodular goiter -  Review of her 3 sonograms is consistent with stable nodule on the right lower her thyroid currently at 1.5 cm with no suspicious features.  Her last thyroid ultrasound was in October 2018.  Her repeat thyroid ultrasound confirms unchanged nodule on the right lobe which will not need any intervention at this time. To document  Stability, another ultrasound in a year is recommended.   2.  Hypothyroidism -Wanting to levothyroxine treatment.  I discussed and increased her dose to 50 mcg p.o. every morning.  - We discussed about the correct intake of her thyroid hormone, on empty stomach at fasting, with water, separated by at least 30 minutes from breakfast and other medications,  and separated by more than 4 hours from calcium, iron, multivitamins, acid reflux medications (PPIs). -Patient is made aware of the fact that thyroid hormone replacement is needed for life, dose to be adjusted by periodic monitoring of thyroid function tests.   -She is currently on treatment for vitamin D deficiency.    - I advised patient to maintain close follow up with Eunice Blase, MD for primary care needs.  - Time spent with the patient: 15 minutes. Destiny Orr participated in the discussions, expressed understanding, and voiced agreement with the above plans.  All questions were answered to her satisfaction. she is encouraged to contact clinic should she have any questions or concerns prior to her return visit.  Follow up plan: Return in about 6 months (around 09/18/2019) for Follow up with Pre-visit Labs.  Glade Lloyd, MD [facility] Phone: [phone]  Fax: [phone]   03/19/2019, 4:59 PM This note was partially dictated with voice recognition software. Similar sounding words can be transcribed inadequately or may not  be corrected upon review.

## 2019-03-25 ENCOUNTER — Other Ambulatory Visit: Payer: Self-pay

## 2019-03-25 ENCOUNTER — Encounter (HOSPITAL_COMMUNITY): Payer: Self-pay | Admitting: Psychiatry

## 2019-03-25 ENCOUNTER — Ambulatory Visit (INDEPENDENT_AMBULATORY_CARE_PROVIDER_SITE_OTHER): Payer: Medicare Other | Admitting: Psychiatry

## 2019-03-25 DIAGNOSIS — F332 Major depressive disorder, recurrent severe without psychotic features: Secondary | ICD-10-CM | POA: Diagnosis not present

## 2019-03-25 MED ORDER — ALPRAZOLAM 1 MG PO TABS: 1.0000 mg | ORAL_TABLET | Freq: Four times a day (QID) | ORAL | 2 refills | Status: DC

## 2019-03-25 MED ORDER — CARBAMAZEPINE 200 MG PO TABS: ORAL_TABLET | ORAL | 2 refills | Status: DC
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
## 2019-04-01 ENCOUNTER — Encounter: Payer: Medicare Other | Admitting: Physical Medicine and Rehabilitation

## 2019-04-15 ENCOUNTER — Other Ambulatory Visit (HOSPITAL_COMMUNITY): Payer: Self-pay | Admitting: *Deleted

## 2019-04-15 DIAGNOSIS — D72828 Other elevated white blood cell count: Secondary | ICD-10-CM

## 2019-04-15 DIAGNOSIS — E538 Deficiency of other specified B group vitamins: Secondary | ICD-10-CM

## 2019-04-15 DIAGNOSIS — D72829 Elevated white blood cell count, unspecified: Secondary | ICD-10-CM

## 2019-04-16 ENCOUNTER — Inpatient Hospital Stay (HOSPITAL_COMMUNITY): Payer: Medicare Other | Attending: Hematology

## 2019-04-18 ENCOUNTER — Ambulatory Visit (INDEPENDENT_AMBULATORY_CARE_PROVIDER_SITE_OTHER): Payer: Medicare Other | Admitting: Physical Medicine and Rehabilitation

## 2019-04-18 ENCOUNTER — Other Ambulatory Visit: Payer: Self-pay

## 2019-04-18 DIAGNOSIS — R202 Paresthesia of skin: Secondary | ICD-10-CM | POA: Diagnosis not present

## 2019-04-18 NOTE — Progress Notes (Signed)
 .  Numeric Pain Rating Scale and Functional Assessment Average Pain 5   In the last MONTH (on 0-10 scale) has pain interfered with the following?  1. General activity like being  able to carry out your everyday physical activities such as walking, climbing stairs, carrying groceries, or moving a chair?  Rating(4)

## 2019-04-21 NOTE — Procedures (Signed)
EMG & NCV Findings: Evaluation of the left radial motor nerve showed prolonged distal onset latency (2.7 ms).  All remaining nerves (as indicated in the following tables) were within normal limits.  All left vs. right side differences were within normal limits.    Needle evaluation of the left first dorsal interosseous muscle showed increased insertional activity and slightly increased spontaneous activity.  All remaining muscles (as indicated in the following table) showed no evidence of electrical instability.    Impression: The above electrodiagnostic study is ABNORMAL and reveals evidence of mild chronic denervation changes to the left FDI muscle. ** Unfortunately, this lone  finding does not meet diagnostic criteria to call this a C8 radiculopathy although her symptoms are somewhat consistent with a C8 radiculopathy.  MRI imaging does not necessarily go along with bilateral complaints.  I cannot rule out nonspecific neurogenic thoracic outlet syndrome which could also give symptoms into the bilateral ulnar digits.  There is clearly no ulnar nerve lesion and this test is fairly sensitive for establishing that issue.  There is no significant electrodiagnostic evidence of any other focal nerve entrapment, brachial plexopathy or generalized peripheral neuropathy.   **This electrodiagnostic study cannot rule out small fiber polyneuropathy and dysesthesias from central pain syndromes such as stroke or central pain sensitization syndromes such as fibromyalgia.  Myotomal referral pain from trigger points is also not excluded.  Recommendations: 1.  Follow-up with referring physician. 2.  Continue current management of symptoms.  Could consider cervical epidural and action.  Could also treat as presumptive thoracic outlet syndrome.  ___________________________ Destiny Orr FAAPMR Board Certified, American Board of Physical Medicine and Rehabilitation    Nerve Conduction Studies Anti Sensory Summary  Table   Stim Site NR Peak (ms) Norm Peak (ms) P-T Amp (V) Norm P-T Amp Site1 Site2 Delta-P (ms) Dist (cm) Vel (m/s) Norm Vel (m/s)  Left Median Acr Palm Anti Sensory (2nd Digit)  32.1C  Wrist    3.0 <3.6 26.9 >10 Wrist Palm 1.5 0.0    Palm    1.5 <2.0 29.7         Right Median Acr Palm Anti Sensory (2nd Digit)  32.3C  Wrist    3.3 <3.6 26.5 >10 Wrist Palm 1.5 0.0    Palm    1.8 <2.0 31.5         Left Radial Anti Sensory (Base 1st Digit)  32.7C  Wrist    2.0 <3.1 41.8  Wrist Base 1st Digit 2.0 0.0    Right Radial Anti Sensory (Base 1st Digit)  32.3C  Wrist    2.1 <3.1 28.9  Wrist Base 1st Digit 2.1 0.0    Left Ulnar Anti Sensory (5th Digit)  32.9C  Wrist    3.3 <3.7 22.0 >15.0 Wrist 5th Digit 3.3 14.0 42 >38  Right Ulnar Anti Sensory (5th Digit)  32.6C  Wrist    3.3 <3.7 15.3 >15.0 Wrist 5th Digit 3.3 14.0 42 >38   Motor Summary Table   Stim Site NR Onset (ms) Norm Onset (ms) O-P Amp (mV) Norm O-P Amp Site1 Site2 Delta-0 (ms) Dist (cm) Vel (m/s) Norm Vel (m/s)  Left Median Motor (Abd Poll Brev)  33C  Wrist    3.0 <4.2 7.3 >5 Elbow Wrist 3.4 20.0 59 >50  Elbow    6.4  7.5         Right Median Motor (Abd Poll Brev)  32.2C  Wrist    3.1 <4.2 5.1 >5 Elbow Wrist 3.5 19.0 54 >  50  Elbow    6.6  4.8         Left Radial Motor (Ext Indicis)  33.2C  8cm    *2.7 <2.5 7.5 >1.7 Up Arm 8cm 3.0 18.0 60 >60  Up Arm    5.7  7.9  Axilla Up Arm 1.9 10.0 53   Axilla    7.6  5.5         Right Ulnar Motor (Abd Dig Min)  32.2C  Wrist    2.7 <4.2 8.9 >3 B Elbow Wrist 3.0 18.5 62 >53  B Elbow    5.7  9.3  A Elbow B Elbow 1.5 10.0 67 >53  A Elbow    7.2  7.0          EMG   Side Muscle Nerve Root Ins Act Fibs Psw Amp Dur Poly Recrt Int Fraser Din Comment  Left 1stDorInt Ulnar C8-T1 *Incr *1+ *1+ Nml Nml 0 Nml Nml   Left Abd Poll Brev Median C8-T1 Nml Nml Nml Nml Nml 0 Nml Nml   Left ExtDigCom   Nml Nml Nml Nml Nml 0 Nml Nml   Left Triceps Radial C6-7-8 Nml Nml Nml Nml Nml 0 Nml Nml   Left Deltoid  Axillary C5-6 Nml Nml Nml Nml Nml 0 Nml Nml     Nerve Conduction Studies Anti Sensory Left/Right Comparison   Stim Site L Lat (ms) R Lat (ms) L-R Lat (ms) L Amp (V) R Amp (V) L-R Amp (%) Site1 Site2 L Vel (m/s) R Vel (m/s) L-R Vel (m/s)  Median Acr Palm Anti Sensory (2nd Digit)  32.1C  Wrist 3.0 3.3 0.3 26.9 26.5 1.5 Wrist Palm     Palm 1.5 1.8 0.3 29.7 31.5 5.7       Radial Anti Sensory (Base 1st Digit)  32.7C  Wrist 2.0 2.1 0.1 41.8 28.9 30.9 Wrist Base 1st Digit     Ulnar Anti Sensory (5th Digit)  32.9C  Wrist 3.3 3.3 0.0 22.0 15.3 30.5 Wrist 5th Digit 42 42 0   Motor Left/Right Comparison   Stim Site L Lat (ms) R Lat (ms) L-R Lat (ms) L Amp (mV) R Amp (mV) L-R Amp (%) Site1 Site2 L Vel (m/s) R Vel (m/s) L-R Vel (m/s)  Median Motor (Abd Poll Brev)  33C  Wrist 3.0 3.1 0.1 7.3 5.1 30.1 Elbow Wrist 59 54 5  Elbow 6.4 6.6 0.2 7.5 4.8 36.0       Radial Motor (Ext Indicis)  33.2C  8cm *2.7   7.5   Up Arm 8cm 60    Up Arm 5.7   7.9   Axilla Up Arm 53    Axilla 7.6   5.5         Ulnar Motor (Abd Dig Min)  32.2C  Wrist  2.7   8.9  B Elbow Wrist  62   B Elbow  5.7   9.3  A Elbow B Elbow  67   A Elbow  7.2   7.0           Waveforms:

## 2019-04-21 NOTE — Progress Notes (Signed)
Misty Foutz - 38 y.o. female MRN [id number]  Date of birth: 03-07-1981  Office Visit Note: Visit Date: 04/18/2019 PCP: Eunice Blase, MD Referred by: Eunice Blase, MD  Subjective: Chief Complaint  Patient presents with  . Neck - Pain  . Right Arm - Numbness, Tingling  . Left Arm - Tingling, Numbness  . Right Hand - Numbness, Tingling  . Left Elbow - Numbness, Tingling  . Left Hand - Numbness, Tingling   HPI: Abrey Yott is a 38 y.o. female who comes in today For electrodiagnostic study of both upper limbs as requested by Dr. Eunice Blase.  Patient is right-hand dominant and reports onset of symptoms in March of this year without any known specific injury.  Symptoms include neck pain with numbness tingling and paresthesia in both arms particularly left elbow but in both hands and consistent on both hands the fourth and fifth digits.  She reports that symptoms are on and off and nothing makes it specifically worse symptomatically.  She rates her pain as a 5 out of 10.  She is not diabetic.  She has had MRI of the cervical spine showing left paracentral disc protrusion at C6-7 but no focal cord compression or nerve compression.  She carries a diagnosis of posttraumatic stress disorder as well as fibromyalgia.  She is using Lyrica and ibuprofen.  ROS Otherwise per HPI.  Assessment & Plan: Visit Diagnoses:  1. Paresthesia of skin     Plan: Impression: The above electrodiagnostic study is ABNORMAL and reveals evidence of mild chronic denervation changes to the left FDI muscle. ** Unfortunately, this lone  finding does not meet diagnostic criteria to call this a C8 radiculopathy although her symptoms are somewhat consistent with a C8 radiculopathy.  MRI imaging does not necessarily go along with bilateral complaints.  I cannot rule out nonspecific neurogenic thoracic outlet syndrome which could also give symptoms into the bilateral ulnar digits.  There is clearly no ulnar nerve lesion and this  test is fairly sensitive for establishing that issue.  There is no significant electrodiagnostic evidence of any other focal nerve entrapment, brachial plexopathy or generalized peripheral neuropathy.   **This electrodiagnostic study cannot rule out small fiber polyneuropathy and dysesthesias from central pain syndromes such as stroke or central pain sensitization syndromes such as fibromyalgia.  Myotomal referral pain from trigger points is also not excluded.  Recommendations: 1.  Follow-up with referring physician. 2.  Continue current management of symptoms.  Could consider cervical epidural and action.  Could also treat as presumptive thoracic outlet syndrome.    Meds & Orders: No orders of the defined types were placed in this encounter.   Orders Placed This Encounter  Procedures  . NCV with EMG (electromyography)    Follow-up: Return for Eunice Blase, MD.   Procedures: No procedures performed  EMG & NCV Findings: Evaluation of the left radial motor nerve showed prolonged distal onset latency (2.7 ms).  All remaining nerves (as indicated in the following tables) were within normal limits.  All left vs. right side differences were within normal limits.    Needle evaluation of the left first dorsal interosseous muscle showed increased insertional activity and slightly increased spontaneous activity.  All remaining muscles (as indicated in the following table) showed no evidence of electrical instability.    Impression: The above electrodiagnostic study is ABNORMAL and reveals evidence of mild chronic denervation changes to the left FDI muscle. ** Unfortunately, this lone  finding does not meet diagnostic criteria to  call this a C8 radiculopathy although her symptoms are somewhat consistent with a C8 radiculopathy.  MRI imaging does not necessarily go along with bilateral complaints.  I cannot rule out nonspecific neurogenic thoracic outlet syndrome which could also give symptoms into  the bilateral ulnar digits.  There is clearly no ulnar nerve lesion and this test is fairly sensitive for establishing that issue.  There is no significant electrodiagnostic evidence of any other focal nerve entrapment, brachial plexopathy or generalized peripheral neuropathy.   **This electrodiagnostic study cannot rule out small fiber polyneuropathy and dysesthesias from central pain syndromes such as stroke or central pain sensitization syndromes such as fibromyalgia.  Myotomal referral pain from trigger points is also not excluded.  Recommendations: 1.  Follow-up with referring physician. 2.  Continue current management of symptoms.  Could consider cervical epidural and action.  Could also treat as presumptive thoracic outlet syndrome.  ___________________________ Laurence Spates FAAPMR Board Certified, American Board of Physical Medicine and Rehabilitation    Nerve Conduction Studies Anti Sensory Summary Table   Stim Site NR Peak (ms) Norm Peak (ms) P-T Amp (V) Norm P-T Amp Site1 Site2 Delta-P (ms) Dist (cm) Vel (m/s) Norm Vel (m/s)  Left Median Acr Palm Anti Sensory (2nd Digit)  32.1C  Wrist    3.0 <3.6 26.9 >10 Wrist Palm 1.5 0.0    Palm    1.5 <2.0 29.7         Right Median Acr Palm Anti Sensory (2nd Digit)  32.3C  Wrist    3.3 <3.6 26.5 >10 Wrist Palm 1.5 0.0    Palm    1.8 <2.0 31.5         Left Radial Anti Sensory (Base 1st Digit)  32.7C  Wrist    2.0 <3.1 41.8  Wrist Base 1st Digit 2.0 0.0    Right Radial Anti Sensory (Base 1st Digit)  32.3C  Wrist    2.1 <3.1 28.9  Wrist Base 1st Digit 2.1 0.0    Left Ulnar Anti Sensory (5th Digit)  32.9C  Wrist    3.3 <3.7 22.0 >15.0 Wrist 5th Digit 3.3 14.0 42 >38  Right Ulnar Anti Sensory (5th Digit)  32.6C  Wrist    3.3 <3.7 15.3 >15.0 Wrist 5th Digit 3.3 14.0 42 >38   Motor Summary Table   Stim Site NR Onset (ms) Norm Onset (ms) O-P Amp (mV) Norm O-P Amp Site1 Site2 Delta-0 (ms) Dist (cm) Vel (m/s) Norm Vel (m/s)  Left Median  Motor (Abd Poll Brev)  33C  Wrist    3.0 <4.2 7.3 >5 Elbow Wrist 3.4 20.0 59 >50  Elbow    6.4  7.5         Right Median Motor (Abd Poll Brev)  32.2C  Wrist    3.1 <4.2 5.1 >5 Elbow Wrist 3.5 19.0 54 >50  Elbow    6.6  4.8         Left Radial Motor (Ext Indicis)  33.2C  8cm    *2.7 <2.5 7.5 >1.7 Up Arm 8cm 3.0 18.0 60 >60  Up Arm    5.7  7.9  Axilla Up Arm 1.9 10.0 53   Axilla    7.6  5.5         Right Ulnar Motor (Abd Dig Min)  32.2C  Wrist    2.7 <4.2 8.9 >3 B Elbow Wrist 3.0 18.5 62 >53  B Elbow    5.7  9.3  A Elbow B Elbow 1.5 10.0 67 >  53  A Elbow    7.2  7.0          EMG   Side Muscle Nerve Root Ins Act Fibs Psw Amp Dur Poly Recrt Int Fraser Din Comment  Left 1stDorInt Ulnar C8-T1 *Incr *1+ *1+ Nml Nml 0 Nml Nml   Left Abd Poll Brev Median C8-T1 Nml Nml Nml Nml Nml 0 Nml Nml   Left ExtDigCom   Nml Nml Nml Nml Nml 0 Nml Nml   Left Triceps Radial C6-7-8 Nml Nml Nml Nml Nml 0 Nml Nml   Left Deltoid Axillary C5-6 Nml Nml Nml Nml Nml 0 Nml Nml     Nerve Conduction Studies Anti Sensory Left/Right Comparison   Stim Site L Lat (ms) R Lat (ms) L-R Lat (ms) L Amp (V) R Amp (V) L-R Amp (%) Site1 Site2 L Vel (m/s) R Vel (m/s) L-R Vel (m/s)  Median Acr Palm Anti Sensory (2nd Digit)  32.1C  Wrist 3.0 3.3 0.3 26.9 26.5 1.5 Wrist Palm     Palm 1.5 1.8 0.3 29.7 31.5 5.7       Radial Anti Sensory (Base 1st Digit)  32.7C  Wrist 2.0 2.1 0.1 41.8 28.9 30.9 Wrist Base 1st Digit     Ulnar Anti Sensory (5th Digit)  32.9C  Wrist 3.3 3.3 0.0 22.0 15.3 30.5 Wrist 5th Digit 42 42 0   Motor Left/Right Comparison   Stim Site L Lat (ms) R Lat (ms) L-R Lat (ms) L Amp (mV) R Amp (mV) L-R Amp (%) Site1 Site2 L Vel (m/s) R Vel (m/s) L-R Vel (m/s)  Median Motor (Abd Poll Brev)  33C  Wrist 3.0 3.1 0.1 7.3 5.1 30.1 Elbow Wrist 59 54 5  Elbow 6.4 6.6 0.2 7.5 4.8 36.0       Radial Motor (Ext Indicis)  33.2C  8cm *2.7   7.5   Up Arm 8cm 60    Up Arm 5.7   7.9   Axilla Up Arm 53    Axilla 7.6   5.5          Ulnar Motor (Abd Dig Min)  32.2C  Wrist  2.7   8.9  B Elbow Wrist  62   B Elbow  5.7   9.3  A Elbow B Elbow  67   A Elbow  7.2   7.0           Waveforms:                     Clinical History: MRI CERVICAL SPINE WITHOUT CONTRAST  TECHNIQUE: Multiplanar, multisequence MR imaging of the cervical spine was performed. No intravenous contrast was administered.  COMPARISON:  10/18/2016  FINDINGS: Alignment: Normal alignment. There is straightening of the normal cervical doses also seen on prior study.  Vertebrae: Normal marrow signal.  No bone lesions or fractures.  Cord: Normal MR appearance of the cervical spinal cord. No cord lesions or syrinx.  Posterior Fossa, vertebral arteries, paraspinal tissues: No significant findings.  Disc levels:  C2-3: No significant findings.  C3-4: No significant findings.  C4-5: No significant findings.  C5-6: No significant findings.  C6-7: Small focal slightly left paracentral disc protrusion with mild mass effect on the ventral thecal sac. No foraminal stenosis.  C7-T1: No significant findings.  IMPRESSION: 1. Normal MR appearance of the cervical spinal cord. No definite cord lesions. 2. Small central and slightly left paracentral new disc protrusion at C6-7.   Electronically Signed   By: Mamie Nick.  Gallerani M.D.   On: 03/07/2019 16:06   She reports that she has been smoking cigarettes. She started smoking about 22 years ago. She has a 15.00 pack-year smoking history. She has never used smokeless tobacco. No results for input(s): HGBA1C, LABURIC in the last 8760 hours.  Objective:  VS:  HT:    WT:   BMI:     BP:   HR: bpm  TEMP: ( )  RESP:  Physical Exam Musculoskeletal:        General: No swelling, tenderness or deformity.     Comments: Inspection reveals no atrophy of the bilateral APB or FDI or hand intrinsics. There is no swelling, color changes, allodynia or dystrophic changes. There is 5 out  of 5 strength in the bilateral wrist extension, finger abduction and long finger flexion. There is intact sensation to light touch in all dermatomal and peripheral nerve distributions.  There is a negative Tinel's test at the bilateral wrist and elbow.  There is a negative Hoffmann's test bilaterally.  Skin:    General: Skin is warm and dry.     Findings: No erythema or rash.  Neurological:     General: No focal deficit present.     Mental Status: She is alert and oriented to person, place, and time.     Motor: No weakness or abnormal muscle tone.     Coordination: Coordination normal.  Psychiatric:        Mood and Affect: Mood normal.        Behavior: Behavior normal.     Ortho Exam Imaging: No results found.  Past Medical/Family/Surgical/Social History: Medications & Allergies reviewed per EMR, new medications updated. Patient Active Problem List   Diagnosis Date Noted  . Hypothyroidism 11/15/2018  . Abnormal Papanicolaou smear of cervix with positive human papilloma virus (HPV) test 10/11/2018  . Goiter 10/08/2018  . Breast tenderness 10/08/2018  . Mass of upper outer quadrant of right breast 10/08/2018  . Encounter for gynecological examination with Papanicolaou smear of cervix 10/08/2018  . Current smoker 07/03/2017  . Leukocytosis 06/18/2017  . Thyroid nodule 12/26/2016  . Neck injury, initial encounter 10/20/2016  . Concussion with loss of consciousness 10/20/2016  . Injury of left shoulder 10/20/2016  . Sinus tachycardia 10/16/2016  . Post concussion syndrome 10/03/2016  . Intractable headache 10/03/2016  . Dizziness 10/03/2016  . Blurry vision, right eye 10/03/2016  . Nodular goiter 06/18/2016  . Fibromyalgia syndrome 06/16/2016  . Raynaud's syndrome without gangrene 06/16/2016  . Numbness and tingling sensation of skin 06/16/2016  . B12 deficiency 06/16/2016  . Dizziness and giddiness 06/16/2016  . Pelvic pain in female 03/03/2016  . Tick bite 03/03/2016  .  POTS (postural orthostatic tachycardia syndrome) 02/05/2016  . Abnormal uterine bleeding (AUB) 12/09/2014  . Depression 11/26/2014  . Dysmenorrhea 07/15/2014  . Irregular menstrual bleeding 07/15/2014  . Back pain, chronic 04/02/2013  . Chronic fatigue and malaise 12/24/2012  . Anxiety 12/24/2012  . PTSD (post-traumatic stress disorder) 12/24/2012  . ADD (attention deficit disorder) 12/24/2012  . Contraception 12/24/2012  . Hx of migraine headaches 12/24/2012   Past Medical History:  Diagnosis Date  . Abnormal Papanicolaou smear of cervix with positive human papilloma virus (HPV) test 10/11/2018   LSIL +HPV, get colpo___  . Abnormal uterine bleeding (AUB) 12/09/2014  . Anxiety   . Arthritis   . Depression   . DJD (degenerative joint disease)   . Dumping syndrome   . Dysmenorrhea 07/15/2014  . Fatigue 12/24/2012  .  Fibromyalgia diagnosed April 2016  . Headache   . Hx of migraine headaches 12/24/2012  . Inappropriate sinus tachycardia   . Irregular menstrual bleeding 07/15/2014  . Pelvic pain in female 03/03/2016  . Pneumothorax, spontaneous, tension   . Post concussion syndrome   . POTS (postural orthostatic tachycardia syndrome)   . PTSD (post-traumatic stress disorder)   . Scoliosis   . Thyroid disease   . Tick bite 03/03/2016  . Vitamin B 12 deficiency    Family History  Problem Relation Age of Onset  . Hypertension Father   . Atrial fibrillation Father   . Alcohol abuse Father   . Cancer Maternal Grandmother        skin   . Heart disease Maternal Grandmother   . Other Maternal Grandmother        had thyroid removed  . Breast cancer Maternal Grandmother   . Heart disease Paternal Grandfather   . COPD Paternal Grandfather   . Hypertension Paternal Grandfather   . Diabetes Paternal Grandfather   . Stroke Paternal Grandfather   . Cancer Maternal Grandfather        bladder,lung  . Anxiety disorder Maternal Aunt   . Anxiety disorder Maternal Uncle   . Bipolar disorder  Maternal Uncle   . Breast cancer Maternal Uncle        CML  . Leukemia Other   . Colon cancer Other        lung-2 maternal great uncles   Past Surgical History:  Procedure Laterality Date  . bone spurs toes Right   . CESAREAN SECTION    . COLONOSCOPY    . endooscopy    . KNEE SURGERY    . rotate cuff lt arm     Social History   Occupational History  . Not on file  Tobacco Use  . Smoking status: Current Every Day Smoker    Packs/day: 1.00    Years: 15.00    Pack years: 15.00    Types: Cigarettes    Start date: 03/13/1997  . Smokeless tobacco: Never Used  . Tobacco comment: "working on it" Was smoking 2.5 packs a day  Substance and Sexual Activity  . Alcohol use: No  . Drug use: No  . Sexual activity: Yes    Partners: Male    Birth control/protection: None

## 2019-04-22 ENCOUNTER — Encounter: Payer: Self-pay | Admitting: Family Medicine

## 2019-04-22 ENCOUNTER — Other Ambulatory Visit: Payer: Self-pay | Admitting: Family Medicine

## 2019-04-22 ENCOUNTER — Ambulatory Visit (INDEPENDENT_AMBULATORY_CARE_PROVIDER_SITE_OTHER): Payer: Medicare Other | Admitting: Family Medicine

## 2019-04-22 ENCOUNTER — Other Ambulatory Visit: Payer: Self-pay

## 2019-04-22 DIAGNOSIS — R200 Anesthesia of skin: Secondary | ICD-10-CM | POA: Diagnosis not present

## 2019-04-22 DIAGNOSIS — M2022 Hallux rigidus, left foot: Secondary | ICD-10-CM | POA: Diagnosis not present

## 2019-04-22 DIAGNOSIS — M79601 Pain in right arm: Secondary | ICD-10-CM | POA: Diagnosis not present

## 2019-04-22 DIAGNOSIS — M79602 Pain in left arm: Secondary | ICD-10-CM

## 2019-04-22 DIAGNOSIS — M2021 Hallux rigidus, right foot: Secondary | ICD-10-CM

## 2019-04-22 DIAGNOSIS — M79671 Pain in right foot: Secondary | ICD-10-CM

## 2019-04-22 DIAGNOSIS — M542 Cervicalgia: Secondary | ICD-10-CM

## 2019-04-22 MED ORDER — CYCLOBENZAPRINE HCL 5 MG PO TABS: 5.0000 mg | ORAL_TABLET | Freq: Three times a day (TID) | ORAL | 3 refills | Status: DC | PRN

## 2019-04-22 MED ORDER — GABAPENTIN 300 MG PO CAPS: ORAL_CAPSULE | ORAL | 5 refills | Status: DC

## 2019-04-22 NOTE — Progress Notes (Signed)
Office Visit Note   Patient: Destiny Orr           Date of Birth: 1981-09-10           MRN: [id number] Visit Date: 04/22/2019 Requested by: Eunice Blase, MD [address] [place],  [place] [address] PCP: Eunice Blase, MD  Subjective: Chief Complaint  Patient presents with  . followup after NCS/EMG    HPI: She is here with several concerns.  She is here to discuss recent nerve study results which showed findings, possibly consistent with C8 radiculopathy but not definitely.  She continues to have bilateral symptoms with numbness and tingling.  She complains of chronic right posterior heel pain for at least several months.  She thinks it is from favoring her first MTP joints which have become much more painful.  She was told in the past that she had arthritis in that part of her foot.               ROS:   All other systems were reviewed and are negative.  Objective: Vital Signs: There were no vitals taken for this visit.  Physical Exam:  General:  Alert and oriented, in no acute distress. Pulm:  Breathing unlabored. Psy:  Normal mood, congruent affect. Skin: No rash on her skin. Neck: Good range of motion, upper extremity strength and reflexes remain normal. Feet: Tender right Achilles insertion on the calcaneus.  Bilateral hallux rigidus was very limited dorsiflexion of both great toes.   Imaging: None today.  Assessment & Plan: 1.  Chronic bilateral arm paresthesias, with cervical disc protrusion more toward the left with abnormal nerve study findings, but not definite radiculopathy findings. -Discussed options with her, elected to resume gabapentin and to refer her to Dr. Ernestina Patches for cervical epidural injection.  2.  Right heel pain consistent with insertional Achilles tendinopathy -Home stretching exercises given.  3.  Bilateral hallux rigidus -Patient would like surgical consult so we will refer her to Dr. Marlou Sa for consideration of cheilectomy.      Procedures: No procedures performed  No notes on file     PMFS History: Patient Active Problem List   Diagnosis Date Noted  . Hypothyroidism 11/15/2018  . Abnormal Papanicolaou smear of cervix with positive human papilloma virus (HPV) test 10/11/2018  . Goiter 10/08/2018  . Breast tenderness 10/08/2018  . Mass of upper outer quadrant of right breast 10/08/2018  . Encounter for gynecological examination with Papanicolaou smear of cervix 10/08/2018  . Current smoker 07/03/2017  . Leukocytosis 06/18/2017  . Thyroid nodule 12/26/2016  . Neck injury, initial encounter 10/20/2016  . Concussion with loss of consciousness 10/20/2016  . Injury of left shoulder 10/20/2016  . Sinus tachycardia 10/16/2016  . Post concussion syndrome 10/03/2016  . Intractable headache 10/03/2016  . Dizziness 10/03/2016  . Blurry vision, right eye 10/03/2016  . Nodular goiter 06/18/2016  . Fibromyalgia syndrome 06/16/2016  . Raynaud's syndrome without gangrene 06/16/2016  . Numbness and tingling sensation of skin 06/16/2016  . B12 deficiency 06/16/2016  . Dizziness and giddiness 06/16/2016  . Pelvic pain in female 03/03/2016  . Tick bite 03/03/2016  . POTS (postural orthostatic tachycardia syndrome) 02/05/2016  . Abnormal uterine bleeding (AUB) 12/09/2014  . Depression 11/26/2014  . Dysmenorrhea 07/15/2014  . Irregular menstrual bleeding 07/15/2014  . Back pain, chronic 04/02/2013  . Chronic fatigue and malaise 12/24/2012  . Anxiety 12/24/2012  . PTSD (post-traumatic stress disorder) 12/24/2012  . ADD (attention deficit disorder) 12/24/2012  .  Contraception 12/24/2012  . Hx of migraine headaches 12/24/2012   Past Medical History:  Diagnosis Date  . Abnormal Papanicolaou smear of cervix with positive human papilloma virus (HPV) test 10/11/2018   LSIL +HPV, get colpo___  . Abnormal uterine bleeding (AUB) 12/09/2014  . Anxiety   . Arthritis   . Depression   . DJD (degenerative joint disease)   .  Dumping syndrome   . Dysmenorrhea 07/15/2014  . Fatigue 12/24/2012  . Fibromyalgia diagnosed April 2016  . Headache   . Hx of migraine headaches 12/24/2012  . Inappropriate sinus tachycardia   . Irregular menstrual bleeding 07/15/2014  . Pelvic pain in female 03/03/2016  . Pneumothorax, spontaneous, tension   . Post concussion syndrome   . POTS (postural orthostatic tachycardia syndrome)   . PTSD (post-traumatic stress disorder)   . Scoliosis   . Thyroid disease   . Tick bite 03/03/2016  . Vitamin B 12 deficiency     Family History  Problem Relation Age of Onset  . Hypertension Father   . Atrial fibrillation Father   . Alcohol abuse Father   . Cancer Maternal Grandmother        skin   . Heart disease Maternal Grandmother   . Other Maternal Grandmother        had thyroid removed  . Breast cancer Maternal Grandmother   . Heart disease Paternal Grandfather   . COPD Paternal Grandfather   . Hypertension Paternal Grandfather   . Diabetes Paternal Grandfather   . Stroke Paternal Grandfather   . Cancer Maternal Grandfather        bladder,lung  . Anxiety disorder Maternal Aunt   . Anxiety disorder Maternal Uncle   . Bipolar disorder Maternal Uncle   . Breast cancer Maternal Uncle        CML  . Leukemia Other   . Colon cancer Other        lung-2 maternal great uncles    Past Surgical History:  Procedure Laterality Date  . bone spurs toes Right   . CESAREAN SECTION    . COLONOSCOPY    . endooscopy    . KNEE SURGERY    . rotate cuff lt arm     Social History   Occupational History  . Not on file  Tobacco Use  . Smoking status: Current Every Day Smoker    Packs/day: 1.00    Years: 15.00    Pack years: 15.00    Types: Cigarettes    Start date: 03/13/1997  . Smokeless tobacco: Never Used  . Tobacco comment: "working on it" Was smoking 2.5 packs a day  Substance and Sexual Activity  . Alcohol use: No  . Drug use: No  . Sexual activity: Yes    Partners: Male    Birth  control/protection: None

## 2019-04-23 ENCOUNTER — Telehealth: Payer: Self-pay | Admitting: *Deleted

## 2019-04-23 ENCOUNTER — Ambulatory Visit (HOSPITAL_COMMUNITY): Payer: Self-pay | Admitting: Hematology

## 2019-04-25 ENCOUNTER — Other Ambulatory Visit: Payer: Self-pay | Admitting: Physical Medicine and Rehabilitation

## 2019-04-25 DIAGNOSIS — F411 Generalized anxiety disorder: Secondary | ICD-10-CM

## 2019-04-25 MED ORDER — DIAZEPAM 5 MG PO TABS: ORAL_TABLET | ORAL | 0 refills | Status: DC

## 2019-04-25 NOTE — Telephone Encounter (Signed)
done

## 2019-04-25 NOTE — Telephone Encounter (Signed)
Called pt and advised.  

## 2019-04-25 NOTE — Progress Notes (Signed)
Pre-procedure diazepam ordered for pre-operative anxiety.  

## 2019-04-28 ENCOUNTER — Encounter (HOSPITAL_COMMUNITY): Payer: Self-pay | Admitting: Psychiatry

## 2019-04-28 ENCOUNTER — Ambulatory Visit (INDEPENDENT_AMBULATORY_CARE_PROVIDER_SITE_OTHER): Payer: Medicare Other | Admitting: Psychiatry

## 2019-04-28 ENCOUNTER — Other Ambulatory Visit: Payer: Self-pay

## 2019-04-28 DIAGNOSIS — F332 Major depressive disorder, recurrent severe without psychotic features: Secondary | ICD-10-CM

## 2019-04-28 MED ORDER — CARBAMAZEPINE 200 MG PO TABS: ORAL_TABLET | ORAL | 2 refills | Status: DC

## 2019-04-28 MED ORDER — ALPRAZOLAM 1 MG PO TABS: 1.0000 mg | ORAL_TABLET | Freq: Four times a day (QID) | ORAL | 2 refills | Status: DC

## 2019-04-28 NOTE — Progress Notes (Signed)
Virtual Visit via Video Note  I connected with Sadaf Virnig on 04/28/19 at 10:20 AM EDT by a video enabled telemedicine application and verified that I am speaking with the correct person using two identifiers.   I discussed the limitations of evaluation and management by telemedicine and the availability of in person appointments. The patient expressed understanding and agreed to proceed.     I discussed the assessment and treatment plan with the patient. The patient was provided an opportunity to ask questions and all were answered. The patient agreed with the plan and demonstrated an understanding of the instructions.   The patient was advised to call back or seek an in-person evaluation if the symptoms worsen or if the condition fails to improve as anticipated.  I provided 15 minutes of non-face-to-face time during this encounter.   [name], MD  [facility] MD/PA/NP OP Progress Note  04/28/2019 10:44 AM Destiny Orr  MRN:  [id number]  Chief Complaint:  Chief Complaint    Depression; Anxiety; Follow-up     HPI: This patient is a 38 year old separated white female who lives with her parents, 2 daughters and 1 son in [place].  She is on disability from [employer].  The patient returns for follow-up of depression and anxiety after 4 weeks.  Last time she felt like the Vraylar was not helping her and making her feel "numb."  We have switched to Tegretol and she is up to 400 mg daily.  She states that she still feels kind of blah and numb.  However she is doing more with her family.  She still worries and is anxious about everything.  They are supposed to go on a beach trip but she worries about ripped tides being out around people etc.  She states that she will have a good week and then a bad one where she does not want to leave the house.  She supposed to have an injection into her neck to help with the numbness in her hands.  I suggested that we continue to go up on the Tegretol and work  towards 400 mg twice daily and she is willing to do this.  The Xanax continues to help her anxiety.  She states that she is trying to get in with a new therapist. Visit Diagnosis:    ICD-10-CM   1. Major depressive disorder, recurrent, severe without psychotic features ([place])  F33.2     Past Psychiatric History: Outpatient treatment for depression and anxiety  Past Medical History:  Past Medical History:  Diagnosis Date  . Abnormal Papanicolaou smear of cervix with positive human papilloma virus (HPV) test 10/11/2018   LSIL +HPV, get colpo___  . Abnormal uterine bleeding (AUB) 12/09/2014  . Anxiety   . Arthritis   . Depression   . DJD (degenerative joint disease)   . Dumping syndrome   . Dysmenorrhea 07/15/2014  . Fatigue 12/24/2012  . Fibromyalgia diagnosed April 2016  . Headache   . Hx of migraine headaches 12/24/2012  . Inappropriate sinus tachycardia   . Irregular menstrual bleeding 07/15/2014  . Pelvic pain in female 03/03/2016  . Pneumothorax, spontaneous, tension   . Post concussion syndrome   . POTS (postural orthostatic tachycardia syndrome)   . PTSD (post-traumatic stress disorder)   . Scoliosis   . Thyroid disease   . Tick bite 03/03/2016  . Vitamin B 12 deficiency     Past Surgical History:  Procedure Laterality Date  . bone spurs toes Right   .  CESAREAN SECTION    . COLONOSCOPY    . endooscopy    . KNEE SURGERY    . rotate cuff lt arm      Family Psychiatric History: see below  Family History:  Family History  Problem Relation Age of Onset  . Hypertension Father   . Atrial fibrillation Father   . Alcohol abuse Father   . Cancer Maternal Grandmother        skin   . Heart disease Maternal Grandmother   . Other Maternal Grandmother        had thyroid removed  . Breast cancer Maternal Grandmother   . Heart disease Paternal Grandfather   . COPD Paternal Grandfather   . Hypertension Paternal Grandfather   . Diabetes Paternal Grandfather   . Stroke Paternal  Grandfather   . Cancer Maternal Grandfather        bladder,lung  . Anxiety disorder Maternal Aunt   . Anxiety disorder Maternal Uncle   . Bipolar disorder Maternal Uncle   . Breast cancer Maternal Uncle        CML  . Leukemia Other   . Colon cancer Other        lung-2 maternal great uncles    Social History:  Social History   Socioeconomic History  . Marital status: Legally Separated    Spouse name: Not on file  . Number of children: 3  . Years of education: Not on file  . Highest education level: Not on file  Occupational History  . Not on file  Social Needs  . Financial resource strain: Not on file  . Food insecurity    Worry: Not on file    Inability: Not on file  . Transportation needs    Medical: Not on file    Non-medical: Not on file  Tobacco Use  . Smoking status: Current Every Day Smoker    Packs/day: 1.00    Years: 15.00    Pack years: 15.00    Types: Cigarettes    Start date: 03/13/1997  . Smokeless tobacco: Never Used  . Tobacco comment: "working on it" Was smoking 2.5 packs a day  Substance and Sexual Activity  . Alcohol use: No  . Drug use: No  . Sexual activity: Yes    Partners: Male    Birth control/protection: None  Lifestyle  . Physical activity    Days per week: Not on file    Minutes per session: Not on file  . Stress: Not on file  Relationships  . Social Herbalist on phone: Not on file    Gets together: Not on file    Attends religious service: Not on file    Active member of club or organization: Not on file    Attends meetings of clubs or organizations: Not on file    Relationship status: Not on file  Other Topics Concern  . Not on file  Social History Narrative  . Not on file    Allergies:  Allergies  Allergen Reactions  . Prozac [Fluoxetine]     Suicidal thoughts  . Topiramate Other (See Comments)    Topamax-dizziness  . Lexapro [Escitalopram Oxalate] Other (See Comments)    Flat affect, No emotions     Metabolic Disorder Labs: Lab Results  Component Value Date   HGBA1C 5.4 01/07/2013   MPG 108 01/07/2013   Lab Results  Component Value Date   PROLACTIN 13.2 06/12/2016   Lab Results  Component Value Date  CHOL 171 10/08/2018   TRIG 200 (H) 10/08/2018   HDL 51 10/08/2018   CHOLHDL 3.4 10/08/2018   VLDL 31 01/07/2013   LDLCALC 80 10/08/2018   LDLCALC 90 01/07/2013   Lab Results  Component Value Date   TSH 1.480 03/05/2019   TSH 1.619 01/02/2019    Therapeutic Level Labs: No results found for: LITHIUM No results found for: VALPROATE No components found for:  CBMZ  Current Medications: Current Outpatient Medications  Medication Sig Dispense Refill  . ALPRAZolam (XANAX) 1 MG tablet Take 1 tablet (1 mg total) by mouth 4 (four) times daily. 120 tablet 2  . carbamazepine (TEGRETOL) 200 MG tablet Take one at bedtime for 2 weeks, then increase to one twice a day 60 tablet 2  . carbamazepine (TEGRETOL) 200 MG tablet Take one 3 times daily for 2 weeks, then increase to two tablet twice a day 120 tablet 2  . Cholecalciferol (VITAMIN D3) 250 MCG (10000 UT) capsule Take 10,000 Units by mouth daily.    . Cyanocobalamin (B-12 PO) Take 1 tablet by mouth daily.    . cyclobenzaprine (FLEXERIL) 5 MG tablet Take 1 tablet (5 mg total) by mouth 3 (three) times daily as needed for muscle spasms. 90 tablet 3  . desogestrel-ethinyl estradiol (KARIVA,AZURETTE,MIRCETTE) 0.15-0.02/0.01 MG (21/5) tablet Take 1 tablet by mouth daily. 1 Package 11  . diazepam (VALIUM) 5 MG tablet Take 1 by mouth 1 hour  pre-procedure with very light food. May bring 2nd tablet to appointment. 2 tablet 0  . gabapentin (NEURONTIN) 300 MG capsule 1 PO q HS, may increase to 1 PO TID if tolerated 90 capsule 5  . ibuprofen (ADVIL,MOTRIN) 200 MG tablet Take 400-600 mg by mouth every 6 (six) hours as needed for mild pain or moderate pain.    Marland Kitchen levothyroxine (SYNTHROID) 50 MCG tablet Take 1 tablet (50 mcg total) by mouth  daily before breakfast. 30 tablet 6  . pregabalin (LYRICA) 75 MG capsule 1 PO q HS, may increase to 1 PO BID if needed (Patient not taking: Reported on 04/22/2019) 60 capsule 3   No current facility-administered medications for this visit.      Musculoskeletal: Strength & Muscle Tone: within normal limits Gait & Station: normal Patient leans: N/A  Psychiatric Specialty Exam: Review of Systems  Musculoskeletal: Positive for neck pain.  Neurological: Positive for tingling.  Psychiatric/Behavioral: Positive for depression. The patient is nervous/anxious.   All other systems reviewed and are negative.   There were no vitals taken for this visit.There is no height or weight on file to calculate BMI.  General Appearance: Casual and Fairly Groomed  Eye Contact:  Good  Speech:  Clear and Coherent  Volume:  Normal  Mood:  Anxious and Dysphoric  Affect:  Flat  Thought Process:  Goal Directed  Orientation:  Full (Time, Place, and Person)  Thought Content: Rumination   Suicidal Thoughts:  No  Homicidal Thoughts:  No  Memory:  Immediate;   Good Recent;   Good Remote;   Good  Judgement:  Fair  Insight:  Fair  Psychomotor Activity:  Decreased  Concentration:  Concentration: Good and Attention Span: Good  Recall:  Good  Fund of Knowledge: Good  Language: Good  Akathisia:  No  Handed:  Right  AIMS (if indicated): not done  Assets:  Communication Skills Desire for Improvement Resilience Social Support Talents/Skills  ADL's:  Intact  Cognition: WNL  Sleep:  Good   Screenings: Mini-Mental     Office  Visit from 10/25/2016 in [facility]  Total Score (max 30 points )  29    PHQ2-9     Office Visit from 10/08/2018 in [place] Visit from 08/21/2017 in [place] Office Visit from 07/18/2017 in [facility] Counselor from 07/10/2017 in [place] Office Visit from 07/03/2017 in  [facility]  PHQ-2 Total Score  6  4  0  5  0  PHQ-9 Total Score  21  21  -  22  -       Assessment and Plan: This patient is a 38 year old female with a history of depression anxiety numerous somatic complaints and low self-esteem.  She claims that she has mood swings and agoraphobia.  We have tried numerous antidepressants and mood stabilizers.  We will continue to go up on Tegretol and work towards 800 mg daily.  She will continue Xanax 1 mg 4 times daily for anxiety.  She will return to see me in 6 weeks   [name], MD 04/28/2019, 10:44 AM

## 2019-05-13 ENCOUNTER — Encounter: Payer: Medicare Other | Admitting: Physical Medicine and Rehabilitation

## 2019-05-19 ENCOUNTER — Encounter: Payer: Medicare Other | Admitting: Physical Medicine and Rehabilitation

## 2019-05-21 ENCOUNTER — Ambulatory Visit: Payer: Medicare Other | Admitting: Orthopedic Surgery

## 2019-05-28 ENCOUNTER — Ambulatory Visit: Payer: Medicare Other

## 2019-05-28 ENCOUNTER — Encounter: Payer: Self-pay | Admitting: Orthopedic Surgery

## 2019-05-28 ENCOUNTER — Ambulatory Visit (INDEPENDENT_AMBULATORY_CARE_PROVIDER_SITE_OTHER): Payer: Medicare Other | Admitting: Orthopedic Surgery

## 2019-05-28 DIAGNOSIS — M79672 Pain in left foot: Secondary | ICD-10-CM

## 2019-05-28 DIAGNOSIS — M2021 Hallux rigidus, right foot: Secondary | ICD-10-CM | POA: Diagnosis not present

## 2019-05-28 DIAGNOSIS — M79671 Pain in right foot: Secondary | ICD-10-CM | POA: Diagnosis not present

## 2019-05-28 DIAGNOSIS — M2022 Hallux rigidus, left foot: Secondary | ICD-10-CM

## 2019-05-30 ENCOUNTER — Inpatient Hospital Stay (HOSPITAL_COMMUNITY): Payer: Medicare Other | Attending: Hematology

## 2019-05-30 ENCOUNTER — Other Ambulatory Visit: Payer: Self-pay

## 2019-05-30 ENCOUNTER — Encounter: Payer: Self-pay | Admitting: Orthopedic Surgery

## 2019-05-30 DIAGNOSIS — D72829 Elevated white blood cell count, unspecified: Secondary | ICD-10-CM

## 2019-05-30 DIAGNOSIS — D72828 Other elevated white blood cell count: Secondary | ICD-10-CM

## 2019-05-30 DIAGNOSIS — E538 Deficiency of other specified B group vitamins: Secondary | ICD-10-CM

## 2019-05-30 LAB — CBC WITH DIFFERENTIAL/PLATELET
Abs Immature Granulocytes: 0.05 10*3/uL (ref 0.00–0.07)
Basophils Absolute: 0.1 10*3/uL (ref 0.0–0.1)
Basophils Relative: 1 %
Eosinophils Absolute: 0.1 10*3/uL (ref 0.0–0.5)
Eosinophils Relative: 1 %
HCT: 42.3 % (ref 36.0–46.0)
Hemoglobin: 14.1 g/dL (ref 12.0–15.0)
Immature Granulocytes: 0 %
Lymphocytes Relative: 19 %
Lymphs Abs: 2.2 10*3/uL (ref 0.7–4.0)
MCH: 30.5 pg (ref 26.0–34.0)
MCHC: 33.3 g/dL (ref 30.0–36.0)
MCV: 91.6 fL (ref 80.0–100.0)
Monocytes Absolute: 0.7 10*3/uL (ref 0.1–1.0)
Monocytes Relative: 6 %
Neutro Abs: 8.5 10*3/uL — ABNORMAL HIGH (ref 1.7–7.7)
Neutrophils Relative %: 73 %
Platelets: 327 10*3/uL (ref 150–400)
RBC: 4.62 MIL/uL (ref 3.87–5.11)
RDW: 12.2 % (ref 11.5–15.5)
WBC: 11.6 10*3/uL — ABNORMAL HIGH (ref 4.0–10.5)
nRBC: 0 % (ref 0.0–0.2)

## 2019-05-30 LAB — COMPREHENSIVE METABOLIC PANEL
ALT: 22 U/L (ref 0–44)
AST: 21 U/L (ref 15–41)
Albumin: 4 g/dL (ref 3.5–5.0)
Alkaline Phosphatase: 99 U/L (ref 38–126)
Anion gap: 10 (ref 5–15)
BUN: 10 mg/dL (ref 6–20)
CO2: 22 mmol/L (ref 22–32)
Calcium: 9.1 mg/dL (ref 8.9–10.3)
Chloride: 108 mmol/L (ref 98–111)
Creatinine, Ser: 0.82 mg/dL (ref 0.44–1.00)
GFR calc Af Amer: >60 mL/min (ref >60)
GFR calc non Af Amer: >60 mL/min (ref >60)
Glucose, Bld: 117 mg/dL — ABNORMAL HIGH (ref 70–99)
Potassium: 4.3 mmol/L (ref 3.5–5.1)
Sodium: 140 mmol/L (ref 135–145)
Total Bilirubin: 0.2 mg/dL — ABNORMAL LOW (ref 0.3–1.2)
Total Protein: 7.3 g/dL (ref 6.5–8.1)

## 2019-05-30 LAB — C-REACTIVE PROTEIN: CRP: 4.4 mg/dL — ABNORMAL HIGH (ref <1.0)

## 2019-05-30 LAB — LACTATE DEHYDROGENASE: LDH: 127 U/L (ref 98–192)

## 2019-05-30 LAB — SEDIMENTATION RATE: Sed Rate: 13 mm/hr (ref 0–22)

## 2019-05-30 NOTE — Progress Notes (Signed)
Office Visit Note   Patient: Destiny Orr           Date of Birth: 1981/04/23           MRN: [id number] Visit Date: 05/28/2019 Requested by: Eunice Blase, MD [address] [place],  [place] [address] PCP: Eunice Blase, MD  Subjective: Chief Complaint  Patient presents with  . Right Foot - Pain  . Left Foot - Pain    HPI: Destiny Orr is a 38 y.o. female who presents to the office complaining of bilateral foot pain.  Patient localizes her pain to her bilateral big toes and reports swelling at the bilateral first MTP joints.  She has had the pain since 2011 but this is her first time seeing an orthopedic provider for this.  Her pain is worse with any weightbearing and she cannot run as she used to be able to.  She has to walk on her lateral feet.  She basically wears flip-flops year-round as tennis shoes caused too much pain on the tops of her toes.  She has been taking Tylenol and ibuprofen as well as gabapentin for pain she has never had an injection before.  Patient has a history of Potts disease, degenerative disc disease, smoking 1 pack/day (down from 2-1/2 packs/day).               ROS:  All systems reviewed are negative as they relate to the chief complaint within the history of present illness.  Patient denies fevers or chills.  Assessment & Plan: Visit Diagnoses:  1. Bilateral foot pain     Plan: Patient is a 38 year old female who presents with 9-year history of bilateral hallux rigidus.  History, physical, and x-rays reveal advanced disease with severe arthritis of bilateral first MTP joint on radiographs.  The right MTP is radiographically worse than the left, but she is symptomatically more painful on the left.  She does not have any pain throughout the rest of her foot aside from some occasional burning pain over the dorsum of her right foot.  Discussed the options available to her including fusion versus MTP joint replacement.  I will refer patient to Dr. Sharol Given for management.   Patient agreed with this plan and will follow-up with Dr. Sharol Given.  Follow-Up Instructions: No follow-ups on file.   Orders:  Orders Placed This Encounter  Procedures  . XR Foot Complete Right  . XR Foot Complete Left   No orders of the defined types were placed in this encounter.     Procedures: No procedures performed   Clinical Data: No additional findings.  Objective: Vital Signs: There were no vitals taken for this visit.  Physical Exam:  Constitutional: Patient appears well-developed HEENT:  Head: Normocephalic Eyes:EOM are normal Neck: Normal range of motion Cardiovascular: Normal rate Pulmonary/chest: Effort normal Neurologic: Patient is alert Skin: Skin is warm Psychiatric: Patient has normal mood and affect  Ortho Exam:  Bilateral foot exam Significant tenderness and swelling of the left and right first MTP joints Markedly reduced motion of the bilateral first MTP joints No tenderness throughout the other 8 toes.  Preserved motion of the other right toes. No tenderness through the rest of the forefoot, midfoot, hindfoot.  Specialty Comments:  No specialty comments available.  Imaging: No results found.   PMFS History: Patient Active Problem List   Diagnosis Date Noted  . Hypothyroidism 11/15/2018  . Abnormal Papanicolaou smear of cervix with positive human papilloma virus (HPV) test 10/11/2018  .  Goiter 10/08/2018  . Breast tenderness 10/08/2018  . Mass of upper outer quadrant of right breast 10/08/2018  . Encounter for gynecological examination with Papanicolaou smear of cervix 10/08/2018  . Current smoker 07/03/2017  . Leukocytosis 06/18/2017  . Thyroid nodule 12/26/2016  . Neck injury, initial encounter 10/20/2016  . Concussion with loss of consciousness 10/20/2016  . Injury of left shoulder 10/20/2016  . Sinus tachycardia 10/16/2016  . Post concussion syndrome 10/03/2016  . Intractable headache 10/03/2016  . Dizziness 10/03/2016  .  Blurry vision, right eye 10/03/2016  . Nodular goiter 06/18/2016  . Fibromyalgia syndrome 06/16/2016  . Raynaud's syndrome without gangrene 06/16/2016  . Numbness and tingling sensation of skin 06/16/2016  . B12 deficiency 06/16/2016  . Dizziness and giddiness 06/16/2016  . Pelvic pain in female 03/03/2016  . Tick bite 03/03/2016  . POTS (postural orthostatic tachycardia syndrome) 02/05/2016  . Abnormal uterine bleeding (AUB) 12/09/2014  . Depression 11/26/2014  . Dysmenorrhea 07/15/2014  . Irregular menstrual bleeding 07/15/2014  . Back pain, chronic 04/02/2013  . Chronic fatigue and malaise 12/24/2012  . Anxiety 12/24/2012  . PTSD (post-traumatic stress disorder) 12/24/2012  . ADD (attention deficit disorder) 12/24/2012  . Contraception 12/24/2012  . Hx of migraine headaches 12/24/2012   Past Medical History:  Diagnosis Date  . Abnormal Papanicolaou smear of cervix with positive human papilloma virus (HPV) test 10/11/2018   LSIL +HPV, get colpo___  . Abnormal uterine bleeding (AUB) 12/09/2014  . Anxiety   . Arthritis   . Depression   . DJD (degenerative joint disease)   . Dumping syndrome   . Dysmenorrhea 07/15/2014  . Fatigue 12/24/2012  . Fibromyalgia diagnosed April 2016  . Headache   . Hx of migraine headaches 12/24/2012  . Inappropriate sinus tachycardia   . Irregular menstrual bleeding 07/15/2014  . Pelvic pain in female 03/03/2016  . Pneumothorax, spontaneous, tension   . Post concussion syndrome   . POTS (postural orthostatic tachycardia syndrome)   . PTSD (post-traumatic stress disorder)   . Scoliosis   . Thyroid disease   . Tick bite 03/03/2016  . Vitamin B 12 deficiency     Family History  Problem Relation Age of Onset  . Hypertension Father   . Atrial fibrillation Father   . Alcohol abuse Father   . Cancer Maternal Grandmother        skin   . Heart disease Maternal Grandmother   . Other Maternal Grandmother        had thyroid removed  . Breast cancer  Maternal Grandmother   . Heart disease Paternal Grandfather   . COPD Paternal Grandfather   . Hypertension Paternal Grandfather   . Diabetes Paternal Grandfather   . Stroke Paternal Grandfather   . Cancer Maternal Grandfather        bladder,lung  . Anxiety disorder Maternal Aunt   . Anxiety disorder Maternal Uncle   . Bipolar disorder Maternal Uncle   . Breast cancer Maternal Uncle        CML  . Leukemia Other   . Colon cancer Other        lung-2 maternal great uncles    Past Surgical History:  Procedure Laterality Date  . bone spurs toes Right   . CESAREAN SECTION    . COLONOSCOPY    . endooscopy    . KNEE SURGERY    . rotate cuff lt arm     Social History   Occupational History  . Not on file  Tobacco  Use  . Smoking status: Current Every Day Smoker    Packs/day: 1.00    Years: 15.00    Pack years: 15.00    Types: Cigarettes    Start date: 03/13/1997  . Smokeless tobacco: Never Used  . Tobacco comment: "working on it" Was smoking 2.5 packs a day  Substance and Sexual Activity  . Alcohol use: No  . Drug use: No  . Sexual activity: Yes    Partners: Male    Birth control/protection: None

## 2019-05-31 ENCOUNTER — Encounter: Payer: Self-pay | Admitting: Orthopedic Surgery

## 2019-06-02 ENCOUNTER — Encounter: Payer: Medicare Other | Admitting: Physical Medicine and Rehabilitation

## 2019-06-05 ENCOUNTER — Ambulatory Visit: Payer: Medicare Other | Admitting: Orthopedic Surgery

## 2019-06-10 ENCOUNTER — Encounter: Payer: Self-pay | Admitting: Orthopedic Surgery

## 2019-06-10 ENCOUNTER — Ambulatory Visit (INDEPENDENT_AMBULATORY_CARE_PROVIDER_SITE_OTHER): Payer: Medicare Other | Admitting: Orthopedic Surgery

## 2019-06-10 ENCOUNTER — Encounter (HOSPITAL_COMMUNITY): Payer: Self-pay | Admitting: Psychiatry

## 2019-06-10 ENCOUNTER — Other Ambulatory Visit: Payer: Self-pay

## 2019-06-10 ENCOUNTER — Ambulatory Visit (INDEPENDENT_AMBULATORY_CARE_PROVIDER_SITE_OTHER): Payer: Medicare Other | Admitting: Psychiatry

## 2019-06-10 VITALS — Ht 65.0 in | Wt 178.0 lb

## 2019-06-10 DIAGNOSIS — M2022 Hallux rigidus, left foot: Secondary | ICD-10-CM | POA: Diagnosis not present

## 2019-06-10 DIAGNOSIS — F332 Major depressive disorder, recurrent severe without psychotic features: Secondary | ICD-10-CM

## 2019-06-10 DIAGNOSIS — M2021 Hallux rigidus, right foot: Secondary | ICD-10-CM

## 2019-06-10 MED ORDER — ALPRAZOLAM 1 MG PO TABS: 1.0000 mg | ORAL_TABLET | Freq: Four times a day (QID) | ORAL | 2 refills | Status: DC

## 2019-06-10 MED ORDER — PAROXETINE HCL 20 MG PO TABS: 20.0000 mg | ORAL_TABLET | Freq: Every day | ORAL | 2 refills | Status: DC

## 2019-06-10 NOTE — Progress Notes (Signed)
Virtual Visit via Telephone Note  I connected with Destiny Orr on 06/10/19 at 10:30 AM EDT by telephone and verified that I am speaking with the correct person using two identifiers.   I discussed the limitations, risks, security and privacy concerns of performing an evaluation and management service by telephone and the availability of in person appointments. I also discussed with the patient that there may be a patient responsible charge related to this service. The patient expressed understanding and agreed to proceed.     I discussed the assessment and treatment plan with the patient. The patient was provided an opportunity to ask questions and all were answered. The patient agreed with the plan and demonstrated an understanding of the instructions.   The patient was advised to call back or seek an in-person evaluation if the symptoms worsen or if the condition fails to improve as anticipated.  I provided 15 minutes of non-face-to-face time during this encounter.   [name], MD  [facility] MD/PA/NP OP Progress Note  06/10/2019 10:52 AM Destiny Orr  MRN:  [id number]  Chief Complaint:  Chief Complaint    Depression; Anxiety; Follow-up     HPI: This patient is a 38 year old separated white female who lives with her parents 2 daughters and 1 son in [place].  She is on disability from [employer].  The patient returns for follow-up of depression and anxiety after 4 weeks.  She states that she feels worse than she is ever felt in a long time.  She is more depressed and anxious.  Whenever she leaves her house her heart rate goes way up to the 160s and she is pouring out sweat and feels excessively anxious.  She is on Tegretol 200 mg twice daily and she does not think it has helped.  She has been on numerous antidepressants and mood stabilizers without relief.  She also states that she hit her head on a cabinet a few weeks ago which caused severe headaches but she did not contact anyone at  primary care.  She denies suicidal ideation but states that she feels depressed anxious and isolated.  She is supposed to start with a new therapist but has not filled out the paperwork yet.  We went over all the numerous medications she has tried.  I suggested Paxil because it helps a lot with anxiety and she has not taken it in a number of years and is willing to give it a try. Visit Diagnosis:    ICD-10-CM   1. Major depressive disorder, recurrent, severe without psychotic features ([place])  F33.2     Past Psychiatric History: Outpatient treatment for depression and anxiety  Past Medical History:  Past Medical History:  Diagnosis Date  . Abnormal Papanicolaou smear of cervix with positive human papilloma virus (HPV) test 10/11/2018   LSIL +HPV, get colpo___  . Abnormal uterine bleeding (AUB) 12/09/2014  . Anxiety   . Arthritis   . Depression   . DJD (degenerative joint disease)   . Dumping syndrome   . Dysmenorrhea 07/15/2014  . Fatigue 12/24/2012  . Fibromyalgia diagnosed April 2016  . Headache   . Hx of migraine headaches 12/24/2012  . Inappropriate sinus tachycardia   . Irregular menstrual bleeding 07/15/2014  . Pelvic pain in female 03/03/2016  . Pneumothorax, spontaneous, tension   . Post concussion syndrome   . POTS (postural orthostatic tachycardia syndrome)   . PTSD (post-traumatic stress disorder)   . Scoliosis   . Thyroid disease   .  Tick bite 03/03/2016  . Vitamin B 12 deficiency     Past Surgical History:  Procedure Laterality Date  . bone spurs toes Right   . CESAREAN SECTION    . COLONOSCOPY    . endooscopy    . KNEE SURGERY    . rotate cuff lt arm      Family Psychiatric History: See below  Family History:  Family History  Problem Relation Age of Onset  . Hypertension Father   . Atrial fibrillation Father   . Alcohol abuse Father   . Cancer Maternal Grandmother        skin   . Heart disease Maternal Grandmother   . Other Maternal Grandmother         had thyroid removed  . Breast cancer Maternal Grandmother   . Heart disease Paternal Grandfather   . COPD Paternal Grandfather   . Hypertension Paternal Grandfather   . Diabetes Paternal Grandfather   . Stroke Paternal Grandfather   . Cancer Maternal Grandfather        bladder,lung  . Anxiety disorder Maternal Aunt   . Anxiety disorder Maternal Uncle   . Bipolar disorder Maternal Uncle   . Breast cancer Maternal Uncle        CML  . Leukemia Other   . Colon cancer Other        lung-2 maternal great uncles    Social History:  Social History   Socioeconomic History  . Marital status: Legally Separated    Spouse name: Not on file  . Number of children: 3  . Years of education: Not on file  . Highest education level: Not on file  Occupational History  . Not on file  Social Needs  . Financial resource strain: Not on file  . Food insecurity    Worry: Not on file    Inability: Not on file  . Transportation needs    Medical: Not on file    Non-medical: Not on file  Tobacco Use  . Smoking status: Current Every Day Smoker    Packs/day: 1.00    Years: 15.00    Pack years: 15.00    Types: Cigarettes    Start date: 03/13/1997  . Smokeless tobacco: Never Used  . Tobacco comment: "working on it" Was smoking 2.5 packs a day  Substance and Sexual Activity  . Alcohol use: No  . Drug use: No  . Sexual activity: Yes    Partners: Male    Birth control/protection: None  Lifestyle  . Physical activity    Days per week: Not on file    Minutes per session: Not on file  . Stress: Not on file  Relationships  . Social Herbalist on phone: Not on file    Gets together: Not on file    Attends religious service: Not on file    Active member of club or organization: Not on file    Attends meetings of clubs or organizations: Not on file    Relationship status: Not on file  Other Topics Concern  . Not on file  Social History Narrative  . Not on file    Allergies:   Allergies  Allergen Reactions  . Prozac [Fluoxetine]     Suicidal thoughts  . Topiramate Other (See Comments)    Topamax-dizziness  . Lexapro [Escitalopram Oxalate] Other (See Comments)    Flat affect, No emotions    Metabolic Disorder Labs: Lab Results  Component Value Date  HGBA1C 5.4 01/07/2013   MPG 108 01/07/2013   Lab Results  Component Value Date   PROLACTIN 13.2 06/12/2016   Lab Results  Component Value Date   CHOL 171 10/08/2018   TRIG 200 (H) 10/08/2018   HDL 51 10/08/2018   CHOLHDL 3.4 10/08/2018   VLDL 31 01/07/2013   LDLCALC 80 10/08/2018   LDLCALC 90 01/07/2013   Lab Results  Component Value Date   TSH 1.480 03/05/2019   TSH 1.619 01/02/2019    Therapeutic Level Labs: No results found for: LITHIUM No results found for: VALPROATE No components found for:  CBMZ  Current Medications: Current Outpatient Medications  Medication Sig Dispense Refill  . ALPRAZolam (XANAX) 1 MG tablet Take 1 tablet (1 mg total) by mouth 4 (four) times daily. 120 tablet 2  . Cholecalciferol (VITAMIN D3) 250 MCG (10000 UT) capsule Take 10,000 Units by mouth daily.    . Cyanocobalamin (B-12 PO) Take 1 tablet by mouth daily.    . cyclobenzaprine (FLEXERIL) 5 MG tablet Take 1 tablet (5 mg total) by mouth 3 (three) times daily as needed for muscle spasms. 90 tablet 3  . desogestrel-ethinyl estradiol (KARIVA,AZURETTE,MIRCETTE) 0.15-0.02/0.01 MG (21/5) tablet Take 1 tablet by mouth daily. 1 Package 11  . diazepam (VALIUM) 5 MG tablet Take 1 by mouth 1 hour  pre-procedure with very light food. May bring 2nd tablet to appointment. 2 tablet 0  . gabapentin (NEURONTIN) 300 MG capsule 1 PO q HS, may increase to 1 PO TID if tolerated 90 capsule 5  . ibuprofen (ADVIL,MOTRIN) 200 MG tablet Take 400-600 mg by mouth every 6 (six) hours as needed for mild pain or moderate pain.    Marland Kitchen levothyroxine (SYNTHROID) 50 MCG tablet Take 1 tablet (50 mcg total) by mouth daily before breakfast. 30 tablet  6  . PARoxetine (PAXIL) 20 MG tablet Take 1 tablet (20 mg total) by mouth at bedtime. 30 tablet 2  . pregabalin (LYRICA) 75 MG capsule 1 PO q HS, may increase to 1 PO BID if needed (Patient not taking: Reported on 04/22/2019) 60 capsule 3   No current facility-administered medications for this visit.      Musculoskeletal: Strength & Muscle Tone: within normal limits Gait & Station: normal Patient leans: N/A  Psychiatric Specialty Exam: Review of Systems  Constitutional: Positive for malaise/fatigue.  Neurological: Positive for tingling and headaches.  Psychiatric/Behavioral: Positive for depression. The patient is nervous/anxious.   All other systems reviewed and are negative.   There were no vitals taken for this visit.There is no height or weight on file to calculate BMI.  General Appearance: NA  Eye Contact:  NA  Speech:  Clear and Coherent  Volume:  Normal  Mood:  Anxious and Depressed  Affect:  NA  Thought Process:  Goal Directed  Orientation:  Full (Time, Place, and Person)  Thought Content: Rumination   Suicidal Thoughts:  No  Homicidal Thoughts:  No  Memory:  Immediate;   Good Recent;   Good Remote;   Fair  Judgement:  Fair  Insight:  Fair  Psychomotor Activity:  Decreased  Concentration:  Concentration: Fair and Attention Span: Fair  Recall:  Good  Fund of Knowledge: Good  Language: Good  Akathisia:  No  Handed:  Right  AIMS (if indicated): not done  Assets:  Communication Skills Desire for Improvement Resilience Social Support Talents/Skills  ADL's:  Intact  Cognition: WNL  Sleep:  Good   Screenings: Mini-Mental     Office Visit  from 10/25/2016 in [place]  Total Score (max 30 points )  29    PHQ2-9     Office Visit from 10/08/2018 in [place] Visit from 08/21/2017 in [place] Office Visit from 07/18/2017 in [facility] Counselor from 07/10/2017 in [place] Office Visit from 07/03/2017 in [facility]  PHQ-2 Total Score  6  4  0  5  0  PHQ-9 Total Score  21  21  -  22  -       Assessment and Plan: This patient is a 38 year old female with a history of depression and anxiety.  She has been on numerous medications.  We will taper off Tegretol as it has not helped and she feels "worse than ever."  We will try Paxil 20 mg at bedtime and continue Xanax 1 mg 4 times daily for anxiety she will return to see me in 4 weeks   [name], MD 06/10/2019, 10:52 AM

## 2019-06-11 ENCOUNTER — Inpatient Hospital Stay (HOSPITAL_COMMUNITY): Payer: Medicare Other | Attending: Hematology | Admitting: Hematology

## 2019-06-11 ENCOUNTER — Other Ambulatory Visit: Payer: Self-pay

## 2019-06-11 ENCOUNTER — Encounter (HOSPITAL_COMMUNITY): Payer: Self-pay | Admitting: Hematology

## 2019-06-11 ENCOUNTER — Telehealth: Payer: Self-pay | Admitting: Family Medicine

## 2019-06-11 VITALS — BP 118/84 | HR 100 | Temp 97.7°F | Resp 18 | Wt 192.0 lb

## 2019-06-11 DIAGNOSIS — D72829 Elevated white blood cell count, unspecified: Secondary | ICD-10-CM | POA: Diagnosis not present

## 2019-06-11 DIAGNOSIS — F17210 Nicotine dependence, cigarettes, uncomplicated: Secondary | ICD-10-CM | POA: Insufficient documentation

## 2019-06-11 NOTE — Telephone Encounter (Signed)
Called patient left message on voicemail to return call to schedule an appointment with Dr Junius Roads  For headache/migraines, tachycardia

## 2019-06-11 NOTE — Progress Notes (Signed)
Destiny Orr, [place] [address]   CLINIC:  Medical Oncology/Hematology  PCP:  Eunice Blase, MD [place] [address] [phone]   REASON FOR VISIT:  Follow-up for neutrophilic leukocytosis.  CURRENT THERAPY: Observation.    INTERVAL HISTORY:  [name] 38 y.o. female seen for follow-up of leukocytosis.  Denies any fevers, night sweats or weight loss in the last 6 months.  Appetite is 75%.  Energy levels are 25%.  Has chronic headaches rated as 4 out of 10.  Mild fatigue is also stable.  Has occasional nausea of long duration which is also stable.  Last reported prednisone use for 10 days was about few months ago.  She is reportedly suffering from neck pain and will have injections soon.  She is current active smoker, smokes 1 pack/day for the past 21 years.    REVIEW OF SYSTEMS:  Review of Systems  Constitutional: Positive for fatigue.  Gastrointestinal: Positive for nausea.  Neurological: Positive for headaches.  All other systems reviewed and are negative.    PAST MEDICAL/SURGICAL HISTORY:  Past Medical History:  Diagnosis Date  . Abnormal Papanicolaou smear of cervix with positive human papilloma virus (HPV) test 10/11/2018   LSIL +HPV, get colpo___  . Abnormal uterine bleeding (AUB) 12/09/2014  . Anxiety   . Arthritis   . Depression   . DJD (degenerative joint disease)   . Dumping syndrome   . Dysmenorrhea 07/15/2014  . Fatigue 12/24/2012  . Fibromyalgia diagnosed April 2016  . Headache   . Hx of migraine headaches 12/24/2012  . Inappropriate sinus tachycardia   . Irregular menstrual bleeding 07/15/2014  . Pelvic pain in female 03/03/2016  . Pneumothorax, spontaneous, tension   . Post concussion syndrome   . POTS (postural orthostatic tachycardia syndrome)   . PTSD (post-traumatic stress disorder)   . Scoliosis   . Thyroid disease   . Tick bite 03/03/2016  . Vitamin B 12 deficiency    Past Surgical  History:  Procedure Laterality Date  . bone spurs toes Right   . CESAREAN SECTION    . COLONOSCOPY    . endooscopy    . KNEE SURGERY    . rotate cuff lt arm       SOCIAL HISTORY:  Social History   Socioeconomic History  . Marital status: Legally Separated    Spouse name: Not on file  . Number of children: 3  . Years of education: Not on file  . Highest education level: Not on file  Occupational History  . Not on file  Social Needs  . Financial resource strain: Not on file  . Food insecurity    Worry: Not on file    Inability: Not on file  . Transportation needs    Medical: Not on file    Non-medical: Not on file  Tobacco Use  . Smoking status: Current Every Day Smoker    Packs/day: 1.00    Years: 15.00    Pack years: 15.00    Types: Cigarettes    Start date: 03/13/1997  . Smokeless tobacco: Never Used  . Tobacco comment: "working on it" Was smoking 2.5 packs a day  Substance and Sexual Activity  . Alcohol use: No  . Drug use: No  . Sexual activity: Yes    Partners: Male    Birth control/protection: None  Lifestyle  . Physical activity    Days per week: Not on file    Minutes  per session: Not on file  . Stress: Not on file  Relationships  . Social Herbalist on phone: Not on file    Gets together: Not on file    Attends religious service: Not on file    Active member of club or organization: Not on file    Attends meetings of clubs or organizations: Not on file    Relationship status: Not on file  . Intimate partner violence    Fear of current or ex partner: Not on file    Emotionally abused: Not on file    Physically abused: Not on file    Forced sexual activity: Not on file  Other Topics Concern  . Not on file  Social History Narrative  . Not on file    FAMILY HISTORY:  Family History  Problem Relation Age of Onset  . Hypertension Father   . Atrial fibrillation Father   . Alcohol abuse Father   . Cancer Maternal Grandmother         skin   . Heart disease Maternal Grandmother   . Other Maternal Grandmother        had thyroid removed  . Breast cancer Maternal Grandmother   . Heart disease Paternal Grandfather   . COPD Paternal Grandfather   . Hypertension Paternal Grandfather   . Diabetes Paternal Grandfather   . Stroke Paternal Grandfather   . Cancer Maternal Grandfather        bladder,lung  . Anxiety disorder Maternal Aunt   . Anxiety disorder Maternal Uncle   . Bipolar disorder Maternal Uncle   . Breast cancer Maternal Uncle        CML  . Leukemia Other   . Colon cancer Other        lung-2 maternal great uncles    CURRENT MEDICATIONS:  Outpatient Encounter Medications as of 06/11/2019  Medication Sig  . ALPRAZolam (XANAX) 1 MG tablet Take 1 tablet (1 mg total) by mouth 4 (four) times daily.  . Cholecalciferol (VITAMIN D3) 250 MCG (10000 UT) capsule Take 10,000 Units by mouth daily.  . Cyanocobalamin (B-12 PO) Take 1 tablet by mouth daily.  Marland Kitchen desogestrel-ethinyl estradiol (KARIVA,AZURETTE,MIRCETTE) 0.15-0.02/0.01 MG (21/5) tablet Take 1 tablet by mouth daily.  Marland Kitchen ibuprofen (ADVIL,MOTRIN) 200 MG tablet Take 400-600 mg by mouth every 6 (six) hours as needed for mild pain or moderate pain.  Marland Kitchen levothyroxine (SYNTHROID) 50 MCG tablet Take 1 tablet (50 mcg total) by mouth daily before breakfast.  . PARoxetine (PAXIL) 20 MG tablet Take 1 tablet (20 mg total) by mouth at bedtime.  . cyclobenzaprine (FLEXERIL) 5 MG tablet Take 1 tablet (5 mg total) by mouth 3 (three) times daily as needed for muscle spasms. (Patient not taking: Reported on 06/11/2019)  . diazepam (VALIUM) 5 MG tablet Take 1 by mouth 1 hour  pre-procedure with very light food. May bring 2nd tablet to appointment. (Patient not taking: Reported on 06/11/2019)  . gabapentin (NEURONTIN) 300 MG capsule 1 PO q HS, may increase to 1 PO TID if tolerated (Patient not taking: Reported on 06/11/2019)  . [DISCONTINUED] penicillin v potassium (VEETID) 500 MG tablet   .  [DISCONTINUED] pregabalin (LYRICA) 75 MG capsule 1 PO q HS, may increase to 1 PO BID if needed (Patient not taking: Reported on 04/22/2019)   No facility-administered encounter medications on file as of 06/11/2019.     ALLERGIES:  Allergies  Allergen Reactions  . Prozac [Fluoxetine]     Suicidal thoughts  .  Topiramate Other (See Comments)    Topamax-dizziness  . Lexapro [Escitalopram Oxalate] Other (See Comments)    Flat affect, No emotions     PHYSICAL EXAM:  ECOG Performance status: 1  Vitals:   06/11/19 1524  BP: 118/84  Pulse: 100  Resp: 18  Temp: 97.7 F (36.5 C)  SpO2: 100%   Filed Weights   06/11/19 1524  Weight: 192 lb (87.1 kg)    Physical Exam Vitals signs reviewed.  Constitutional:      Appearance: Normal appearance.  Cardiovascular:     Rate and Rhythm: Normal rate and regular rhythm.     Heart sounds: Normal heart sounds.  Pulmonary:     Effort: Pulmonary effort is normal.     Breath sounds: Normal breath sounds.  Abdominal:     General: There is no distension.     Palpations: Abdomen is soft. There is no mass.  Musculoskeletal:        General: No swelling.  Lymphadenopathy:     Cervical: No cervical adenopathy.  Skin:    General: Skin is warm.  Neurological:     General: No focal deficit present.     Mental Status: She is alert and oriented to person, place, and time.  Psychiatric:        Mood and Affect: Mood normal.        Behavior: Behavior normal.      LABORATORY DATA:  I have reviewed the labs as listed.  CBC    Component Value Date/Time   WBC 11.6 (H) 05/30/2019 1214   RBC 4.62 05/30/2019 1214   HGB 14.1 05/30/2019 1214   HGB 14.7 10/08/2018 1427   HCT 42.3 05/30/2019 1214   HCT 43.2 10/08/2018 1427   PLT 327 05/30/2019 1214   PLT 377 10/08/2018 1427   MCV 91.6 05/30/2019 1214   MCV 91 10/08/2018 1427   MCH 30.5 05/30/2019 1214   MCHC 33.3 05/30/2019 1214   RDW 12.2 05/30/2019 1214   RDW 12.5 10/08/2018 1427    LYMPHSABS 2.2 05/30/2019 1214   LYMPHSABS 2.6 12/22/2015 1017   MONOABS 0.7 05/30/2019 1214   EOSABS 0.1 05/30/2019 1214   EOSABS 0.1 12/22/2015 1017   BASOSABS 0.1 05/30/2019 1214   BASOSABS 0.1 12/22/2015 1017   CMP Latest Ref Rng & Units 05/30/2019 10/18/2018 10/08/2018  Glucose 70 - 99 mg/dL 117(H) 147(H) 77  BUN 6 - 20 mg/dL 10 15 9   Creatinine 0.44 - 1.00 mg/dL 0.82 0.88 0.76  Sodium 135 - 145 mmol/L 140 135 139  Potassium 3.5 - 5.1 mmol/L 4.3 4.6 4.8  Chloride 98 - 111 mmol/L 108 102 97  CO2 22 - 32 mmol/L 22 23 26   Calcium 8.9 - 10.3 mg/dL 9.1 9.1 10.2  Total Protein 6.5 - 8.1 g/dL 7.3 6.9 6.9  Total Bilirubin 0.3 - 1.2 mg/dL 0.2(L) 0.3 0.2  Alkaline Phos 38 - 126 U/L 99 70 111  AST 15 - 41 U/L 21 23 24   ALT 0 - 44 U/L 22 29 34(H)       DIAGNOSTIC IMAGING:  I have independently reviewed the scans and discussed with the patient.     ASSESSMENT & PLAN:   Leukocytosis 1.  Neutrophilic leukocytosis: - She had extensive work-up including negative BCR/ABL by FISH and Jak 2 V617F and reflex mutation testing. - She smokes 1 pack/day for 21 years. -She was thought to have smoking-related leukocytosis. - Most recent use of steroids was prednisone for 10 days about few  months ago. -Denies any B symptoms including fevers, night sweats or weight loss. - Physical exam today did not reveal any palpable splenomegaly or lymphadenopathy. - She was counseled to quit smoking.  We will continue to monitor her blood counts once a year to see if there is any significant progression. -We will consider doing a bone marrow aspiration and biopsy if there is any significant change in her blood counts. -We reviewed her labs.  White count is minimally elevated.  Hemoglobin and platelets were normal.  2.  Health maintenance: -She had a mammogram in January 2020 which was BI-RADS Category 2.   Total time spent is 25 minutes with more than 50% of the time spent face-to-face discussing lab  results, counseling and coordination of care.  Orders placed this encounter:  Orders Placed This Encounter  Procedures  . CBC with Differential/Platelet  . Comprehensive metabolic panel  . Lactate dehydrogenase      Derek Jack, MD [place] [phone]

## 2019-06-11 NOTE — Assessment & Plan Note (Signed)
1.  Neutrophilic leukocytosis: - She had extensive work-up including negative BCR/ABL by FISH and Jak 2 V617F and reflex mutation testing. - She smokes 1 pack/day for 21 years. -She was thought to have smoking-related leukocytosis. - Most recent use of steroids was prednisone for 10 days about few months ago. -Denies any B symptoms including fevers, night sweats or weight loss. - Physical exam today did not reveal any palpable splenomegaly or lymphadenopathy. - She was counseled to quit smoking.  We will continue to monitor her blood counts once a year to see if there is any significant progression. -We will consider doing a bone marrow aspiration and biopsy if there is any significant change in her blood counts. -We reviewed her labs.  White count is minimally elevated.  Hemoglobin and platelets were normal.  2.  Health maintenance: -She had a mammogram in January 2020 which was BI-RADS Category 2.

## 2019-06-11 NOTE — Patient Instructions (Addendum)
[facility] at [facility] Discharge Instructions  You were seen today by Dr. Katragadda. He went over your recent lab results. He will see you back in 1 year for labs and follow up.   Thank you for choosing [facility] at [facility] to provide your oncology and hematology care.  To afford each patient quality time with our provider, please arrive at least 15 minutes before your scheduled appointment time.   If you have a lab appointment with the Cancer Center please come in thru the  Main Entrance and check in at the main information desk  You need to re-schedule your appointment should you arrive 10 or more minutes late.  We strive to give you quality time with our providers, and arriving late affects you and other patients whose appointments are after yours.  Also, if you no show three or more times for appointments you may be dismissed from the clinic at the providers discretion.     Again, thank you for choosing [facility].  Our hope is that these requests will decrease the amount of time that you wait before being seen by our physicians.       _____________________________________________________________  Should you have questions after your visit to [facility], please contact our office at [phone] between the hours of 8:00 a.m. and 4:30 p.m.  Voicemails left after 4:00 p.m. will not be returned until the following business day.  For prescription refill requests, have your pharmacy contact our office and allow 72 hours.    Cancer Center Support Programs:   > Cancer Support Group  2nd Tuesday of the month 1pm-2pm, Journey Room    

## 2019-06-12 ENCOUNTER — Ambulatory Visit (INDEPENDENT_AMBULATORY_CARE_PROVIDER_SITE_OTHER): Payer: Medicare Other | Admitting: Family Medicine

## 2019-06-12 ENCOUNTER — Encounter: Payer: Self-pay | Admitting: Family Medicine

## 2019-06-12 VITALS — BP 124/82 | HR 86

## 2019-06-12 DIAGNOSIS — R51 Headache: Secondary | ICD-10-CM

## 2019-06-12 DIAGNOSIS — R519 Headache, unspecified: Secondary | ICD-10-CM

## 2019-06-12 MED ORDER — PREDNISONE 10 MG PO TABS: ORAL_TABLET | ORAL | 0 refills | Status: DC

## 2019-06-12 MED ORDER — BACLOFEN 10 MG PO TABS: 5.0000 mg | ORAL_TABLET | Freq: Three times a day (TID) | ORAL | 1 refills | Status: DC | PRN

## 2019-06-12 MED ORDER — TRAMADOL HCL 50 MG PO TABS: 50.0000 mg | ORAL_TABLET | Freq: Four times a day (QID) | ORAL | 0 refills | Status: DC | PRN

## 2019-06-12 NOTE — Progress Notes (Signed)
Office Visit Note   Patient: Destiny Orr           Date of Birth: [birth year]           MRN: [id number] Visit Date: 06/12/2019 Requested by: Eunice Blase, MD [address] [place],  [place] [address] PCP: Eunice Blase, MD  Subjective: Chief Complaint  Patient presents with  . Headaches x 1 month (h/o migraines)    HPI: She is here with headaches.  For the past month she has had a headache every day.  She cannot figure out what triggered this.  She has a history of migraine headaches in the past but never headaches lasting this long.  She has had some photophobia and nausea but no vomiting but no vomiting.  No visual disturbance.  No change in her medications.  She has been using Tylenol, ibuprofen and Aleve intermittently with no improvement.              ROS: No fevers or chills, no head congestion.  All other systems were reviewed and are negative.  Objective: Vital Signs: BP 124/82 (BP Location: Left Arm, Patient Position: Sitting, Cuff Size: Normal)   Pulse 86   Physical Exam:  General:  Alert and oriented, in no acute distress. Pulm:  Breathing unlabored. Psy:  Normal mood, congruent affect.  HEENT:  [place]/AT, PERRLA, EOM Full, no nystagmus.  Funduscopic examination within normal limits.  No conjunctival erythema.  Tympanic membranes are pearly gray with normal landmarks.  External ear canals are normal.  Nasal passages are clear.  Oropharynx is clear.  No significant lymphadenopathy.  No thyromegaly or nodules.  2+ carotid pulses without bruits.  Imaging: None today..  Assessment & Plan: 1.  Headaches, etiology uncertain. -Prednisone taper, Zanaflex as needed, tramadol as needed.  If symptoms persist, consider brain MRI scan.     Procedures: No procedures performed  No notes on file     PMFS History: Patient Active Problem List   Diagnosis Date Noted  . Hypothyroidism 11/15/2018  . Abnormal Papanicolaou smear of cervix with positive human papilloma virus (HPV)  test 10/11/2018  . Goiter 10/08/2018  . Breast tenderness 10/08/2018  . Mass of upper outer quadrant of right breast 10/08/2018  . Encounter for gynecological examination with Papanicolaou smear of cervix 10/08/2018  . Current smoker 07/03/2017  . Leukocytosis 06/18/2017  . Thyroid nodule 12/26/2016  . Neck injury, initial encounter 10/20/2016  . Concussion with loss of consciousness 10/20/2016  . Injury of left shoulder 10/20/2016  . Sinus tachycardia 10/16/2016  . Post concussion syndrome 10/03/2016  . Intractable headache 10/03/2016  . Dizziness 10/03/2016  . Blurry vision, right eye 10/03/2016  . Nodular goiter 06/18/2016  . Fibromyalgia syndrome 06/16/2016  . Raynaud's syndrome without gangrene 06/16/2016  . Numbness and tingling sensation of skin 06/16/2016  . B12 deficiency 06/16/2016  . Dizziness and giddiness 06/16/2016  . Pelvic pain in female 03/03/2016  . Tick bite 03/03/2016  . POTS (postural orthostatic tachycardia syndrome) 02/05/2016  . Abnormal uterine bleeding (AUB) 12/09/2014  . Depression 11/26/2014  . Dysmenorrhea 07/15/2014  . Irregular menstrual bleeding 07/15/2014  . Back pain, chronic 04/02/2013  . Chronic fatigue and malaise 12/24/2012  . Anxiety 12/24/2012  . PTSD (post-traumatic stress disorder) 12/24/2012  . ADD (attention deficit disorder) 12/24/2012  . Contraception 12/24/2012  . Hx of migraine headaches 12/24/2012   Past Medical History:  Diagnosis Date  . Abnormal Papanicolaou smear of cervix with positive human papilloma virus (HPV) test  10/11/2018   LSIL +HPV, get colpo___  . Abnormal uterine bleeding (AUB) 12/09/2014  . Anxiety   . Arthritis   . Depression   . DJD (degenerative joint disease)   . Dumping syndrome   . Dysmenorrhea 07/15/2014  . Fatigue 12/24/2012  . Fibromyalgia diagnosed April 2016  . Headache   . Hx of migraine headaches 12/24/2012  . Inappropriate sinus tachycardia   . Irregular menstrual bleeding 07/15/2014  .  Pelvic pain in female 03/03/2016  . Pneumothorax, spontaneous, tension   . Post concussion syndrome   . POTS (postural orthostatic tachycardia syndrome)   . PTSD (post-traumatic stress disorder)   . Scoliosis   . Thyroid disease   . Tick bite 03/03/2016  . Vitamin B 12 deficiency     Family History  Problem Relation Age of Onset  . Hypertension Father   . Atrial fibrillation Father   . Alcohol abuse Father   . Cancer Maternal Grandmother        skin   . Heart disease Maternal Grandmother   . Other Maternal Grandmother        had thyroid removed  . Breast cancer Maternal Grandmother   . Heart disease Paternal Grandfather   . COPD Paternal Grandfather   . Hypertension Paternal Grandfather   . Diabetes Paternal Grandfather   . Stroke Paternal Grandfather   . Cancer Maternal Grandfather        bladder,lung  . Anxiety disorder Maternal Aunt   . Anxiety disorder Maternal Uncle   . Bipolar disorder Maternal Uncle   . Breast cancer Maternal Uncle        CML  . Leukemia Other   . Colon cancer Other        lung-2 maternal great uncles    Past Surgical History:  Procedure Laterality Date  . bone spurs toes Right   . CESAREAN SECTION    . COLONOSCOPY    . endooscopy    . KNEE SURGERY    . rotate cuff lt arm     Social History   Occupational History  . Not on file  Tobacco Use  . Smoking status: Current Every Day Smoker    Packs/day: 1.00    Years: 15.00    Pack years: 15.00    Types: Cigarettes    Start date: 03/13/1997  . Smokeless tobacco: Never Used  . Tobacco comment: "working on it" Was smoking 2.5 packs a day  Substance and Sexual Activity  . Alcohol use: No  . Drug use: No  . Sexual activity: Yes    Partners: Male    Birth control/protection: None

## 2019-06-17 ENCOUNTER — Telehealth: Payer: Self-pay | Admitting: Family Medicine

## 2019-06-17 DIAGNOSIS — R519 Headache, unspecified: Secondary | ICD-10-CM

## 2019-06-17 NOTE — Telephone Encounter (Signed)
Pt called in wanting to let dr.hilts know that the medication for headaches isn't helping her at all and is wondering what should she do now.   [phone]

## 2019-06-17 NOTE — Telephone Encounter (Signed)
I called and advised patient of the plan.

## 2019-06-17 NOTE — Telephone Encounter (Signed)
Brain MRI and neurology referral ordered.

## 2019-06-17 NOTE — Addendum Note (Signed)
Addended by: Hortencia Pilar on: 06/17/2019 09:52 AM   Modules accepted: Orders

## 2019-06-17 NOTE — Telephone Encounter (Signed)
Please advise 

## 2019-07-01 ENCOUNTER — Encounter: Payer: Self-pay | Admitting: Orthopedic Surgery

## 2019-07-01 NOTE — Progress Notes (Signed)
Office Visit Note   Patient: Destiny Orr           Date of Birth: Mar 25, 1981           MRN: [id number] Visit Date: 06/10/2019              Requested by: Eunice Blase, MD [address] [place],  [place] [address] PCP: Eunice Blase, MD  Chief Complaint  Patient presents with  . Right Foot - Pain  . Left Foot - Pain      HPI: Patient is a 38 year old woman who is seen in referral from Dr. Marlou Sa.  States she started have pain in 2009 and now has pain over the MTP joint on both feet right worse than left of the great toe.  Patient states she is unable to bend her toes.  Assessment & Plan: Visit Diagnoses:  1. Hallux rigidus, left foot   2. Hallux rigidus, right foot     Plan: Discussed with the patient her best option would be to proceed with an MTP fusion on the right foot first followed by the second foot.  Discussed postoperative protocol but she would need to be off her foot for about a month.  Recommended stiff soled shoes in the interim she will call when she is ready to schedule surgery.  Follow-Up Instructions: Return if symptoms worsen or fail to improve.   Ortho Exam  Patient is alert, oriented, no adenopathy, well-dressed, normal affect, normal respiratory effort. Examination patient has good dorsalis pedis pulse bilaterally she has dorsiflexion to neutral bilaterally is good subtalar and ankle motion.  On the right foot she has bony spurs over the MTP joint of the right great toe with dorsiflexion of 0 degrees plantarflexion a 10 degrees left foot she has dorsiflexion of the great toe MTP joint of 10 degrees plantarflexion of 20 degrees with ankle dorsiflexion to neutral.  She has bony spurs on the MTP joint of both feet which is painful to palpation.  Imaging: No results found. No images are attached to the encounter.  Labs: Lab Results  Component Value Date   HGBA1C 5.4 01/07/2013   ESRSEDRATE 13 05/30/2019   ESRSEDRATE 10 10/08/2018   ESRSEDRATE 10  08/21/2017   CRP 4.4 (H) 05/30/2019     Lab Results  Component Value Date   ALBUMIN 4.0 05/30/2019   ALBUMIN 4.0 10/18/2018   ALBUMIN 4.6 10/08/2018    No results found for: MG Lab Results  Component Value Date   VD25OH 28.7 (L) 03/05/2019   VD25OH 21.3 (L) 10/18/2018   VD25OH 25 (L) 01/07/2013    No results found for: PREALBUMIN CBC EXTENDED Latest Ref Rng & Units 05/30/2019 10/18/2018 10/08/2018  WBC 4.0 - 10.5 K/uL 11.6(H) 11.9(H) 12.6(H)  RBC 3.87 - 5.11 MIL/uL 4.62 4.65 4.77  HGB 12.0 - 15.0 g/dL 14.1 14.3 14.7  HCT 36.0 - 46.0 % 42.3 43.6 43.2  PLT 150 - 400 K/uL 327 292 377  NEUTROABS 1.7 - 7.7 K/uL 8.5(H) 8.4(H) -  LYMPHSABS 0.7 - 4.0 K/uL 2.2 2.7 -     Body mass index is 29.62 kg/m.  Orders:  No orders of the defined types were placed in this encounter.  No orders of the defined types were placed in this encounter.    Procedures: No procedures performed  Clinical Data: No additional findings.  ROS:  All other systems negative, except as noted in the HPI. Review of Systems  Objective: Vital Signs: Ht 5\' 5"  (1.651  m)   Wt 178 lb (80.7 kg)   BMI 29.62 kg/m   Specialty Comments:  No specialty comments available.  PMFS History: Patient Active Problem List   Diagnosis Date Noted  . Hypothyroidism 11/15/2018  . Abnormal Papanicolaou smear of cervix with positive human papilloma virus (HPV) test 10/11/2018  . Goiter 10/08/2018  . Breast tenderness 10/08/2018  . Mass of upper outer quadrant of right breast 10/08/2018  . Encounter for gynecological examination with Papanicolaou smear of cervix 10/08/2018  . Current smoker 07/03/2017  . Leukocytosis 06/18/2017  . Thyroid nodule 12/26/2016  . Neck injury, initial encounter 10/20/2016  . Concussion with loss of consciousness 10/20/2016  . Injury of left shoulder 10/20/2016  . Sinus tachycardia 10/16/2016  . Post concussion syndrome 10/03/2016  . Intractable headache 10/03/2016  . Dizziness  10/03/2016  . Blurry vision, right eye 10/03/2016  . Nodular goiter 06/18/2016  . Fibromyalgia syndrome 06/16/2016  . Raynaud's syndrome without gangrene 06/16/2016  . Numbness and tingling sensation of skin 06/16/2016  . B12 deficiency 06/16/2016  . Dizziness and giddiness 06/16/2016  . Pelvic pain in female 03/03/2016  . Tick bite 03/03/2016  . POTS (postural orthostatic tachycardia syndrome) 02/05/2016  . Abnormal uterine bleeding (AUB) 12/09/2014  . Depression 11/26/2014  . Dysmenorrhea 07/15/2014  . Irregular menstrual bleeding 07/15/2014  . Back pain, chronic 04/02/2013  . Chronic fatigue and malaise 12/24/2012  . Anxiety 12/24/2012  . PTSD (post-traumatic stress disorder) 12/24/2012  . ADD (attention deficit disorder) 12/24/2012  . Contraception 12/24/2012  . Hx of migraine headaches 12/24/2012   Past Medical History:  Diagnosis Date  . Abnormal Papanicolaou smear of cervix with positive human papilloma virus (HPV) test 10/11/2018   LSIL +HPV, get colpo___  . Abnormal uterine bleeding (AUB) 12/09/2014  . Anxiety   . Arthritis   . Depression   . DJD (degenerative joint disease)   . Dumping syndrome   . Dysmenorrhea 07/15/2014  . Fatigue 12/24/2012  . Fibromyalgia diagnosed April 2016  . Headache   . Hx of migraine headaches 12/24/2012  . Inappropriate sinus tachycardia   . Irregular menstrual bleeding 07/15/2014  . Pelvic pain in female 03/03/2016  . Pneumothorax, spontaneous, tension   . Post concussion syndrome   . POTS (postural orthostatic tachycardia syndrome)   . PTSD (post-traumatic stress disorder)   . Scoliosis   . Thyroid disease   . Tick bite 03/03/2016  . Vitamin B 12 deficiency     Family History  Problem Relation Age of Onset  . Hypertension Father   . Atrial fibrillation Father   . Alcohol abuse Father   . Cancer Maternal Grandmother        skin   . Heart disease Maternal Grandmother   . Other Maternal Grandmother        had thyroid removed  .  Breast cancer Maternal Grandmother   . Heart disease Paternal Grandfather   . COPD Paternal Grandfather   . Hypertension Paternal Grandfather   . Diabetes Paternal Grandfather   . Stroke Paternal Grandfather   . Cancer Maternal Grandfather        bladder,lung  . Anxiety disorder Maternal Aunt   . Anxiety disorder Maternal Uncle   . Bipolar disorder Maternal Uncle   . Breast cancer Maternal Uncle        CML  . Leukemia Other   . Colon cancer Other        lung-2 maternal great uncles    Past Surgical History:  Procedure Laterality Date  . bone spurs toes Right   . CESAREAN SECTION    . COLONOSCOPY    . endooscopy    . KNEE SURGERY    . rotate cuff lt arm     Social History   Occupational History  . Not on file  Tobacco Use  . Smoking status: Current Every Day Smoker    Packs/day: 1.00    Years: 15.00    Pack years: 15.00    Types: Cigarettes    Start date: 03/13/1997  . Smokeless tobacco: Never Used  . Tobacco comment: "working on it" Was smoking 2.5 packs a day  Substance and Sexual Activity  . Alcohol use: No  . Drug use: No  . Sexual activity: Yes    Partners: Male    Birth control/protection: None

## 2019-07-08 ENCOUNTER — Encounter (HOSPITAL_COMMUNITY): Payer: Self-pay | Admitting: Psychiatry

## 2019-07-08 ENCOUNTER — Ambulatory Visit (INDEPENDENT_AMBULATORY_CARE_PROVIDER_SITE_OTHER): Payer: Medicare Other | Admitting: Psychiatry

## 2019-07-08 ENCOUNTER — Other Ambulatory Visit: Payer: Self-pay

## 2019-07-08 DIAGNOSIS — F332 Major depressive disorder, recurrent severe without psychotic features: Secondary | ICD-10-CM

## 2019-07-08 MED ORDER — PAROXETINE HCL 30 MG PO TABS: 30.0000 mg | ORAL_TABLET | Freq: Every day | ORAL | 2 refills | Status: DC

## 2019-07-08 MED ORDER — ALPRAZOLAM 1 MG PO TABS: 1.0000 mg | ORAL_TABLET | Freq: Four times a day (QID) | ORAL | 2 refills | Status: DC

## 2019-07-08 NOTE — Progress Notes (Signed)
Virtual Visit via Telephone Note  I connected with Destiny Orr on 07/08/19 at  8:40 AM EDT by telephone and verified that I am speaking with the correct person using two identifiers.   I discussed the limitations, risks, security and privacy concerns of performing an evaluation and management service by telephone and the availability of in person appointments. I also discussed with the patient that there may be a patient responsible charge related to this service. The patient expressed understanding and agreed to proceed.       I discussed the assessment and treatment plan with the patient. The patient was provided an opportunity to ask questions and all were answered. The patient agreed with the plan and demonstrated an understanding of the instructions.   The patient was advised to call back or seek an in-person evaluation if the symptoms worsen or if the condition fails to improve as anticipated.  I provided 15 minutes of non-face-to-face time during this encounter.   [name], MD  [facility] MD/PA/NP OP Progress Note  07/08/2019 9:05 AM Destiny Orr  MRN:  [id number]  Chief Complaint:  Chief Complaint    Depression; Anxiety; Follow-up     HPI: This patient is a 38 year old separated white female who lives with her parents 2 daughters and 1 son in [place].  She is on disability from [employer].  The patient returns for follow-up of depression and anxiety after 4 weeks.  Last time we started her on Paxil.  She states that she mostly feels about the same.  She has had a new stressor in the fact that her grandmother had a stroke and was hospitalized over the weekend.  Her mother has been gone quite a bit to help her grandmother and she has had to do a lot more at home.  She stated that this is caused her pots to get worse and she is having episodes of tachycardia and hypertension.  She is going to go back to cardiology.  She is also had severe headaches and is slated to have a brain MRI  tomorrow.  I suggested we continue to inch up on the Paxil the Xanax continues to help her anxiety and she denies suicidal ideation Visit Diagnosis:    ICD-10-CM   1. Major depressive disorder, recurrent, severe without psychotic features ([place])  F33.2     Past Psychiatric History: Outpatient treatment for depression and anxiety  Past Medical History:  Past Medical History:  Diagnosis Date  . Abnormal Papanicolaou smear of cervix with positive human papilloma virus (HPV) test 10/11/2018   LSIL +HPV, get colpo___  . Abnormal uterine bleeding (AUB) 12/09/2014  . Anxiety   . Arthritis   . Depression   . DJD (degenerative joint disease)   . Dumping syndrome   . Dysmenorrhea 07/15/2014  . Fatigue 12/24/2012  . Fibromyalgia diagnosed April 2016  . Headache   . Hx of migraine headaches 12/24/2012  . Inappropriate sinus tachycardia   . Irregular menstrual bleeding 07/15/2014  . Pelvic pain in female 03/03/2016  . Pneumothorax, spontaneous, tension   . Post concussion syndrome   . POTS (postural orthostatic tachycardia syndrome)   . PTSD (post-traumatic stress disorder)   . Scoliosis   . Thyroid disease   . Tick bite 03/03/2016  . Vitamin B 12 deficiency     Past Surgical History:  Procedure Laterality Date  . bone spurs toes Right   . CESAREAN SECTION    . COLONOSCOPY    . endooscopy    .  KNEE SURGERY    . rotate cuff lt arm      Family Psychiatric History: see below  Family History:  Family History  Problem Relation Age of Onset  . Hypertension Father   . Atrial fibrillation Father   . Alcohol abuse Father   . Cancer Maternal Grandmother        skin   . Heart disease Maternal Grandmother   . Other Maternal Grandmother        had thyroid removed  . Breast cancer Maternal Grandmother   . Heart disease Paternal Grandfather   . COPD Paternal Grandfather   . Hypertension Paternal Grandfather   . Diabetes Paternal Grandfather   . Stroke Paternal Grandfather   . Cancer  Maternal Grandfather        bladder,lung  . Anxiety disorder Maternal Aunt   . Anxiety disorder Maternal Uncle   . Bipolar disorder Maternal Uncle   . Breast cancer Maternal Uncle        CML  . Leukemia Other   . Colon cancer Other        lung-2 maternal great uncles    Social History:  Social History   Socioeconomic History  . Marital status: Legally Separated    Spouse name: Not on file  . Number of children: 3  . Years of education: Not on file  . Highest education level: Not on file  Occupational History  . Not on file  Social Needs  . Financial resource strain: Not on file  . Food insecurity    Worry: Not on file    Inability: Not on file  . Transportation needs    Medical: Not on file    Non-medical: Not on file  Tobacco Use  . Smoking status: Current Every Day Smoker    Packs/day: 1.00    Years: 15.00    Pack years: 15.00    Types: Cigarettes    Start date: 03/13/1997  . Smokeless tobacco: Never Used  . Tobacco comment: "working on it" Was smoking 2.5 packs a day  Substance and Sexual Activity  . Alcohol use: No  . Drug use: No  . Sexual activity: Yes    Partners: Male    Birth control/protection: None  Lifestyle  . Physical activity    Days per week: Not on file    Minutes per session: Not on file  . Stress: Not on file  Relationships  . Social Herbalist on phone: Not on file    Gets together: Not on file    Attends religious service: Not on file    Active member of club or organization: Not on file    Attends meetings of clubs or organizations: Not on file    Relationship status: Not on file  Other Topics Concern  . Not on file  Social History Narrative  . Not on file    Allergies:  Allergies  Allergen Reactions  . Prozac [Fluoxetine]     Suicidal thoughts  . Topiramate Other (See Comments)    Topamax-dizziness  . Lexapro [Escitalopram Oxalate] Other (See Comments)    Flat affect, No emotions    Metabolic Disorder  Labs: Lab Results  Component Value Date   HGBA1C 5.4 01/07/2013   MPG 108 01/07/2013   Lab Results  Component Value Date   PROLACTIN 13.2 06/12/2016   Lab Results  Component Value Date   CHOL 171 10/08/2018   TRIG 200 (H) 10/08/2018   HDL 51 10/08/2018  CHOLHDL 3.4 10/08/2018   VLDL 31 01/07/2013   LDLCALC 80 10/08/2018   LDLCALC 90 01/07/2013   Lab Results  Component Value Date   TSH 1.480 03/05/2019   TSH 1.619 01/02/2019    Therapeutic Level Labs: No results found for: LITHIUM No results found for: VALPROATE No components found for:  CBMZ  Current Medications: Current Outpatient Medications  Medication Sig Dispense Refill  . ALPRAZolam (XANAX) 1 MG tablet Take 1 tablet (1 mg total) by mouth 4 (four) times daily. 120 tablet 2  . baclofen (LIORESAL) 10 MG tablet Take 0.5-1 tablets (5-10 mg total) by mouth 3 (three) times daily as needed for muscle spasms. 30 each 1  . Cholecalciferol (VITAMIN D3) 250 MCG (10000 UT) capsule Take 10,000 Units by mouth daily.    . Cyanocobalamin (B-12 PO) Take 1 tablet by mouth daily.    . cyclobenzaprine (FLEXERIL) 5 MG tablet Take 1 tablet (5 mg total) by mouth 3 (three) times daily as needed for muscle spasms. 90 tablet 3  . desogestrel-ethinyl estradiol (KARIVA,AZURETTE,MIRCETTE) 0.15-0.02/0.01 MG (21/5) tablet Take 1 tablet by mouth daily. 1 Package 11  . gabapentin (NEURONTIN) 300 MG capsule 1 PO q HS, may increase to 1 PO TID if tolerated 90 capsule 5  . ibuprofen (ADVIL,MOTRIN) 200 MG tablet Take 400-600 mg by mouth every 6 (six) hours as needed for mild pain or moderate pain.    Marland Kitchen levothyroxine (SYNTHROID) 50 MCG tablet Take 1 tablet (50 mcg total) by mouth daily before breakfast. 30 tablet 6  . PARoxetine (PAXIL) 30 MG tablet Take 1 tablet (30 mg total) by mouth daily. 30 tablet 2  . predniSONE (DELTASONE) 10 MG tablet Take as directed for 12 days.  Daily dose 6,6,5,5,4,4,3,3,2,2,1,1. 42 tablet 0  . traMADol (ULTRAM) 50 MG tablet  Take 1 tablet (50 mg total) by mouth every 6 (six) hours as needed. 30 tablet 0   No current facility-administered medications for this visit.      Musculoskeletal: Strength & Muscle Tone: within normal limits Gait & Station: normal Patient leans: N/A  Psychiatric Specialty Exam: Review of Systems  Constitutional: Positive for malaise/fatigue.  Neurological: Positive for weakness and headaches.  Psychiatric/Behavioral: Positive for depression.  All other systems reviewed and are negative.   There were no vitals taken for this visit.There is no height or weight on file to calculate BMI.  General Appearance: NA  Eye Contact:  NA  Speech:  Clear and Coherent  Volume:  Normal  Mood:  Dysphoric  Affect:  NA  Thought Process:  Goal Directed  Orientation:  Full (Time, Place, and Person)  Thought Content: Rumination   Suicidal Thoughts:  No  Homicidal Thoughts:  No  Memory:  Immediate;   Good Recent;   Good Remote;   Good  Judgement:  Fair  Insight:  Fair  Psychomotor Activity:  Decreased  Concentration:  Concentration: Good and Attention Span: Good  Recall:  Good  Fund of Knowledge: Good  Language: Good  Akathisia:  No  Handed:  Right  AIMS (if indicated): not done  Assets:  Communication Skills Desire for Improvement Resilience Social Support Talents/Skills  ADL's:  Intact  Cognition: WNL  Sleep:  Fair   Screenings: Mini-Mental     Office Visit from 10/25/2016 in [facility]  Total Score (max 30 points )  29    PHQ2-9     Office Visit from 10/08/2018 in [facility] [place] Office Visit from 08/21/2017 in [facility]  Visit from 07/18/2017 in [facility] Counselor from 07/10/2017 in [place] [facility] Office Visit from 07/03/2017 in [facility]  PHQ-2 Total Score  6  4  0  5  0  PHQ-9 Total Score  21  21  -  22  -       Assessment and Plan: This patient  is a 38 year old female with a history of depression and anxiety as well as numerous physical difficulty such as intractable headaches and pots syndrome.  She has been on numerous medications.  She states that she is slated to start therapy soon and I think this will help more than anything.  However we will increase Paxil to 30 mg at bedtime and continue Xanax 1 mg 4 times daily for anxiety.  She will return to see me in 4 weeks   [name], MD 07/08/2019, 9:05 AM

## 2019-07-09 ENCOUNTER — Ambulatory Visit
Admission: RE | Admit: 2019-07-09 | Discharge: 2019-07-09 | Disposition: A | Discharge status: Home / Self Care | Payer: Medicare Other | Source: Ambulatory Visit | Attending: Family Medicine | Admitting: Family Medicine

## 2019-07-09 DIAGNOSIS — H538 Other visual disturbances: Secondary | ICD-10-CM | POA: Diagnosis not present

## 2019-07-09 DIAGNOSIS — R519 Headache, unspecified: Secondary | ICD-10-CM

## 2019-07-10 ENCOUNTER — Telehealth: Payer: Self-pay | Admitting: Family Medicine

## 2019-07-10 NOTE — Telephone Encounter (Signed)
Brain MRI looks normal.

## 2019-07-11 MED ORDER — NABUMETONE 750 MG PO TABS: 750.0000 mg | ORAL_TABLET | Freq: Two times a day (BID) | ORAL | 6 refills | Status: DC | PRN

## 2019-07-11 NOTE — Addendum Note (Signed)
Addended by: Hortencia Pilar on: 07/11/2019 12:02 PM   Modules accepted: Orders

## 2019-08-04 ENCOUNTER — Ambulatory Visit: Payer: Medicare Other | Admitting: Neurology

## 2019-08-04 ENCOUNTER — Telehealth: Payer: Self-pay | Admitting: Neurology

## 2019-08-04 ENCOUNTER — Encounter: Payer: Self-pay | Admitting: Neurology

## 2019-08-04 NOTE — Telephone Encounter (Signed)
Patient was no show for the 2 pm new patient apt today.

## 2019-08-05 ENCOUNTER — Other Ambulatory Visit: Payer: Self-pay

## 2019-08-05 ENCOUNTER — Encounter (HOSPITAL_COMMUNITY): Payer: Self-pay | Admitting: Psychiatry

## 2019-08-05 ENCOUNTER — Ambulatory Visit (INDEPENDENT_AMBULATORY_CARE_PROVIDER_SITE_OTHER): Payer: Medicare Other | Admitting: Psychiatry

## 2019-08-05 DIAGNOSIS — F332 Major depressive disorder, recurrent severe without psychotic features: Secondary | ICD-10-CM | POA: Diagnosis not present

## 2019-08-05 MED ORDER — PAROXETINE HCL 20 MG PO TABS: 20.0000 mg | ORAL_TABLET | Freq: Every day | ORAL | 2 refills | Status: DC

## 2019-08-05 MED ORDER — LITHIUM CARBONATE 300 MG PO TABS: ORAL_TABLET | ORAL | 2 refills | Status: DC

## 2019-08-05 MED ORDER — ALPRAZOLAM 1 MG PO TABS: 1.0000 mg | ORAL_TABLET | Freq: Four times a day (QID) | ORAL | 2 refills | Status: DC

## 2019-08-05 NOTE — Progress Notes (Signed)
Virtual Visit via Video Note  I connected with Destiny Orr on 08/05/19 at  9:20 AM EST by a video enabled telemedicine application and verified that I am speaking with the correct person using two identifiers.   I discussed the limitations of evaluation and management by telemedicine and the availability of in person appointments. The patient expressed understanding and agreed to proceed.     I discussed the assessment and treatment plan with the patient. The patient was provided an opportunity to ask questions and all were answered. The patient agreed with the plan and demonstrated an understanding of the instructions.   The patient was advised to call back or seek an in-person evaluation if the symptoms worsen or if the condition fails to improve as anticipated.  I provided 15 minutes of non-face-to-face time during this encounter.   [name], MD  [facility] MD/PA/NP OP Progress Note  08/05/2019 9:43 AM Destiny Orr  MRN:  [id number]  Chief Complaint:  Chief Complaint    Depression; Anxiety; Follow-up     HPI: This patient is a 38 year old separated white female who lives with her parents 2 daughters and 1 son in [place].  She is on disability from [employer].  The patient returns after 4 weeks for follow-up of depression and anxiety.  Last time we increase Paxil to 30 mg daily she states that she feels worse-more "blank" and depressed.  She claims she does not have energy to do much of anything.  She had a recent brain MRI that was negative.  She states that she recently had a tooth pulled and now she is running a low-grade fever and has pain on that side.  She just completed a course of penicillin.  We reviewed again all of the medications she has tried which include just about all the available antidepressants and several mood stabilizers.  I suggested we go down again on the Paxil and add lithium since she has not yet tried this and sometimes this can boost up the effects of  antidepressants.  She is willing to give it a try.  I also suggested if we are not making much progress I can send in referrals for either ECT or transcranial magnetic treatment. Visit Diagnosis:    ICD-10-CM   1. Major depressive disorder, recurrent, severe without psychotic features ([place])  F33.2 Lithium level    Past Psychiatric History: Outpatient treatment for depression and anxiety  Past Medical History:  Past Medical History:  Diagnosis Date  . Abnormal Papanicolaou smear of cervix with positive human papilloma virus (HPV) test 10/11/2018   LSIL +HPV, get colpo___  . Abnormal uterine bleeding (AUB) 12/09/2014  . Anxiety   . Arthritis   . Depression   . DJD (degenerative joint disease)   . Dumping syndrome   . Dysmenorrhea 07/15/2014  . Fatigue 12/24/2012  . Fibromyalgia diagnosed April 2016  . Headache   . Hx of migraine headaches 12/24/2012  . Inappropriate sinus tachycardia   . Irregular menstrual bleeding 07/15/2014  . Pelvic pain in female 03/03/2016  . Pneumothorax, spontaneous, tension   . Post concussion syndrome   . POTS (postural orthostatic tachycardia syndrome)   . PTSD (post-traumatic stress disorder)   . Scoliosis   . Thyroid disease   . Tick bite 03/03/2016  . Vitamin B 12 deficiency     Past Surgical History:  Procedure Laterality Date  . bone spurs toes Right   . CESAREAN SECTION    . COLONOSCOPY    .  endooscopy    . KNEE SURGERY    . rotate cuff lt arm      Family Psychiatric History: see below  Family History:  Family History  Problem Relation Age of Onset  . Hypertension Father   . Atrial fibrillation Father   . Alcohol abuse Father   . Cancer Maternal Grandmother        skin   . Heart disease Maternal Grandmother   . Other Maternal Grandmother        had thyroid removed  . Breast cancer Maternal Grandmother   . Heart disease Paternal Grandfather   . COPD Paternal Grandfather   . Hypertension Paternal Grandfather   . Diabetes Paternal  Grandfather   . Stroke Paternal Grandfather   . Cancer Maternal Grandfather        bladder,lung  . Anxiety disorder Maternal Aunt   . Anxiety disorder Maternal Uncle   . Bipolar disorder Maternal Uncle   . Breast cancer Maternal Uncle        CML  . Leukemia Other   . Colon cancer Other        lung-2 maternal great uncles    Social History:  Social History   Socioeconomic History  . Marital status: Legally Separated    Spouse name: Not on file  . Number of children: 3  . Years of education: Not on file  . Highest education level: Not on file  Occupational History  . Not on file  Social Needs  . Financial resource strain: Not on file  . Food insecurity    Worry: Not on file    Inability: Not on file  . Transportation needs    Medical: Not on file    Non-medical: Not on file  Tobacco Use  . Smoking status: Current Every Day Smoker    Packs/day: 1.00    Years: 15.00    Pack years: 15.00    Types: Cigarettes    Start date: 03/13/1997  . Smokeless tobacco: Never Used  . Tobacco comment: "working on it" Was smoking 2.5 packs a day  Substance and Sexual Activity  . Alcohol use: No  . Drug use: No  . Sexual activity: Yes    Partners: Male    Birth control/protection: None  Lifestyle  . Physical activity    Days per week: Not on file    Minutes per session: Not on file  . Stress: Not on file  Relationships  . Social Herbalist on phone: Not on file    Gets together: Not on file    Attends religious service: Not on file    Active member of club or organization: Not on file    Attends meetings of clubs or organizations: Not on file    Relationship status: Not on file  Other Topics Concern  . Not on file  Social History Narrative  . Not on file    Allergies:  Allergies  Allergen Reactions  . Prozac [Fluoxetine]     Suicidal thoughts  . Topiramate Other (See Comments)    Topamax-dizziness  . Lexapro [Escitalopram Oxalate] Other (See Comments)     Flat affect, No emotions    Metabolic Disorder Labs: Lab Results  Component Value Date   HGBA1C 5.4 01/07/2013   MPG 108 01/07/2013   Lab Results  Component Value Date   PROLACTIN 13.2 06/12/2016   Lab Results  Component Value Date   CHOL 171 10/08/2018   TRIG 200 (H) 10/08/2018  HDL 51 10/08/2018   CHOLHDL 3.4 10/08/2018   VLDL 31 01/07/2013   LDLCALC 80 10/08/2018   LDLCALC 90 01/07/2013   Lab Results  Component Value Date   TSH 1.480 03/05/2019   TSH 1.619 01/02/2019    Therapeutic Level Labs: No results found for: LITHIUM No results found for: VALPROATE No components found for:  CBMZ  Current Medications: Current Outpatient Medications  Medication Sig Dispense Refill  . ALPRAZolam (XANAX) 1 MG tablet Take 1 tablet (1 mg total) by mouth 4 (four) times daily. 120 tablet 2  . baclofen (LIORESAL) 10 MG tablet Take 0.5-1 tablets (5-10 mg total) by mouth 3 (three) times daily as needed for muscle spasms. 30 each 1  . Cholecalciferol (VITAMIN D3) 250 MCG (10000 UT) capsule Take 10,000 Units by mouth daily.    . Cyanocobalamin (B-12 PO) Take 1 tablet by mouth daily.    . cyclobenzaprine (FLEXERIL) 5 MG tablet Take 1 tablet (5 mg total) by mouth 3 (three) times daily as needed for muscle spasms. 90 tablet 3  . desogestrel-ethinyl estradiol (KARIVA,AZURETTE,MIRCETTE) 0.15-0.02/0.01 MG (21/5) tablet Take 1 tablet by mouth daily. 1 Package 11  . gabapentin (NEURONTIN) 300 MG capsule 1 PO q HS, may increase to 1 PO TID if tolerated 90 capsule 5  . ibuprofen (ADVIL,MOTRIN) 200 MG tablet Take 400-600 mg by mouth every 6 (six) hours as needed for mild pain or moderate pain.    Marland Kitchen levothyroxine (SYNTHROID) 50 MCG tablet Take 1 tablet (50 mcg total) by mouth daily before breakfast. 30 tablet 6  . lithium 300 MG tablet Take 300 mg at bedtime for 2 weeks, then increase to 600 mg at bedtime 60 tablet 2  . nabumetone (RELAFEN) 750 MG tablet Take 1 tablet (750 mg total) by mouth 2 (two)  times daily as needed. 60 tablet 6  . PARoxetine (PAXIL) 20 MG tablet Take 1 tablet (20 mg total) by mouth daily. 30 tablet 2  . traMADol (ULTRAM) 50 MG tablet Take 1 tablet (50 mg total) by mouth every 6 (six) hours as needed. 30 tablet 0   No current facility-administered medications for this visit.      Musculoskeletal: Strength & Muscle Tone: within normal limits Gait & Station: normal Patient leans: N/A  Psychiatric Specialty Exam: Review of Systems  Constitutional: Positive for fever and malaise/fatigue.  HENT: Positive for sinus pain.   Psychiatric/Behavioral: Positive for depression. The patient is nervous/anxious.   All other systems reviewed and are negative.   There were no vitals taken for this visit.There is no height or weight on file to calculate BMI.  General Appearance: Casual and Fairly Groomed  Eye Contact:  Good  Speech:  Clear and Coherent  Volume:  Normal  Mood:  Dysphoric  Affect:  Blunt and Flat  Thought Process:  Goal Directed  Orientation:  Full (Time, Place, and Person)  Thought Content: Rumination   Suicidal Thoughts:  No  Homicidal Thoughts:  No  Memory:  Immediate;   Good Recent;   Good Remote;   Good  Judgement:  Fair  Insight:  Fair  Psychomotor Activity:  Decreased  Concentration:  Concentration: Good and Attention Span: Good  Recall:  Good  Fund of Knowledge: Good  Language: Good  Akathisia:  No  Handed:  Right  AIMS (if indicated): not done  Assets:  Communication Skills Desire for Improvement Resilience Social Support Talents/Skills  ADL's:  Intact  Cognition: WNL  Sleep:  Fair   Screenings: Mini-Mental  Office Visit from 10/25/2016 in [facility] [place]  Total Score (max 30 points )  29    PHQ2-9     Office Visit from 10/08/2018 in [place] Visit from 08/21/2017 in [place] Office Visit from 07/18/2017 in [facility] Counselor from 07/10/2017 in [place] Office Visit from 07/03/2017 in [facility]  PHQ-2 Total Score  6  4  0  5  0  PHQ-9 Total Score  21  21  -  22  -       Assessment and Plan: This patient is a 38 year old female who has had intractable depression and dysthymia.  We are not making much headway with numerous medication trials.  For now we will go back down on Paxil to 20 mg at bedtime continue Xanax 1 mg 4 times daily for anxiety and begin lithium carbonate 300 mg at bedtime for 2 weeks and then advance to 600 mg at bedtime.  Risks and side effects regarding thyroid and kidney function have been explained.  The patient is already on thyroid replacement.  After 3 weeks she will check a lithium level.  She will return to see me in 4 weeks   [name], MD 08/05/2019, 9:43 AM

## 2019-09-01 ENCOUNTER — Other Ambulatory Visit: Payer: Self-pay | Admitting: "Endocrinology

## 2019-09-02 ENCOUNTER — Ambulatory Visit (HOSPITAL_COMMUNITY): Payer: Medicare Other | Admitting: Psychiatry

## 2019-09-02 ENCOUNTER — Other Ambulatory Visit: Payer: Self-pay

## 2019-09-02 ENCOUNTER — Ambulatory Visit (INDEPENDENT_AMBULATORY_CARE_PROVIDER_SITE_OTHER): Payer: Medicare Other | Admitting: Psychiatry

## 2019-09-02 ENCOUNTER — Encounter (HOSPITAL_COMMUNITY): Payer: Self-pay | Admitting: Psychiatry

## 2019-09-02 DIAGNOSIS — F332 Major depressive disorder, recurrent severe without psychotic features: Secondary | ICD-10-CM | POA: Diagnosis not present

## 2019-09-02 MED ORDER — ALPRAZOLAM 1 MG PO TABS: 1.0000 mg | ORAL_TABLET | Freq: Four times a day (QID) | ORAL | 2 refills | Status: DC

## 2019-09-02 MED ORDER — PAROXETINE HCL 20 MG PO TABS: 20.0000 mg | ORAL_TABLET | Freq: Every day | ORAL | 2 refills | Status: DC

## 2019-09-02 MED ORDER — LITHIUM CARBONATE 300 MG PO TABS: 600.0000 mg | ORAL_TABLET | Freq: Every day | ORAL | 2 refills | Status: DC

## 2019-09-02 NOTE — Progress Notes (Signed)
Virtual Visit via Video Note  I connected with Merary Brauner on 09/02/19 at 10:20 AM EST by a video enabled telemedicine application and verified that I am speaking with the correct person using two identifiers.   I discussed the limitations of evaluation and management by telemedicine and the availability of in person appointments. The patient expressed understanding and agreed to proceed    I discussed the assessment and treatment plan with the patient. The patient was provided an opportunity to ask questions and all were answered. The patient agreed with the plan and demonstrated an understanding of the instructions.   The patient was advised to call back or seek an in-person evaluation if the symptoms worsen or if the condition fails to improve as anticipated.  I provided 15 minutes of non-face-to-face time during this encounter.   [name], MD  [facility] MD/PA/NP OP Progress Note  09/02/2019 10:34 AM Awilda Ketterling  MRN:  [id number]  Chief Complaint:  Chief Complaint    Depression; Anxiety; Follow-up     HPI: This patient is a 38 year old separated white female who lives with her parents, 2 daughters and 1 son in [place].  She is on disability from [employer].  The patient returns after 4 weeks for follow-up of depression and anxiety.  Last time we added lithium carbonate to her regimen.  She claims that she does not feel "all that different."  She is up to 600 mg at bedtime.  She is going to get the lithium level checked this week so we can make adjustments as needed.  On the positive side she is not having any side effects.  She is sleeping fairly well.  She still has a lot of fear of leaving the house and does not want to be seen by other people.  She had found a therapist that she liked but now found out that the therapist will not take her insurance.  I explained that we will be getting a new therapist in our office and perhaps we could get her involved here.  She denies suicidal  ideation. Visit Diagnosis:    ICD-10-CM   1. Major depressive disorder, recurrent, severe without psychotic features ([place])  F33.2     Past Psychiatric History: Outpatient treatment for depression and anxiety  Past Medical History:  Past Medical History:  Diagnosis Date  . Abnormal Papanicolaou smear of cervix with positive human papilloma virus (HPV) test 10/11/2018   LSIL +HPV, get colpo___  . Abnormal uterine bleeding (AUB) 12/09/2014  . Anxiety   . Arthritis   . Depression   . DJD (degenerative joint disease)   . Dumping syndrome   . Dysmenorrhea 07/15/2014  . Fatigue 12/24/2012  . Fibromyalgia diagnosed April 2016  . Headache   . Hx of migraine headaches 12/24/2012  . Inappropriate sinus tachycardia   . Irregular menstrual bleeding 07/15/2014  . Pelvic pain in female 03/03/2016  . Pneumothorax, spontaneous, tension   . Post concussion syndrome   . POTS (postural orthostatic tachycardia syndrome)   . PTSD (post-traumatic stress disorder)   . Scoliosis   . Thyroid disease   . Tick bite 03/03/2016  . Vitamin B 12 deficiency     Past Surgical History:  Procedure Laterality Date  . bone spurs toes Right   . CESAREAN SECTION    . COLONOSCOPY    . endooscopy    . KNEE SURGERY    . rotate cuff lt arm      Family Psychiatric History: see  below  Family History:  Family History  Problem Relation Age of Onset  . Hypertension Father   . Atrial fibrillation Father   . Alcohol abuse Father   . Cancer Maternal Grandmother        skin   . Heart disease Maternal Grandmother   . Other Maternal Grandmother        had thyroid removed  . Breast cancer Maternal Grandmother   . Heart disease Paternal Grandfather   . COPD Paternal Grandfather   . Hypertension Paternal Grandfather   . Diabetes Paternal Grandfather   . Stroke Paternal Grandfather   . Cancer Maternal Grandfather        bladder,lung  . Anxiety disorder Maternal Aunt   . Anxiety disorder Maternal Uncle   . Bipolar  disorder Maternal Uncle   . Breast cancer Maternal Uncle        CML  . Leukemia Other   . Colon cancer Other        lung-2 maternal great uncles    Social History:  Social History   Socioeconomic History  . Marital status: Legally Separated    Spouse name: Not on file  . Number of children: 3  . Years of education: Not on file  . Highest education level: Not on file  Occupational History  . Not on file  Social Needs  . Financial resource strain: Not on file  . Food insecurity    Worry: Not on file    Inability: Not on file  . Transportation needs    Medical: Not on file    Non-medical: Not on file  Tobacco Use  . Smoking status: Current Every Day Smoker    Packs/day: 1.00    Years: 15.00    Pack years: 15.00    Types: Cigarettes    Start date: 03/13/1997  . Smokeless tobacco: Never Used  . Tobacco comment: "working on it" Was smoking 2.5 packs a day  Substance and Sexual Activity  . Alcohol use: No  . Drug use: No  . Sexual activity: Yes    Partners: Male    Birth control/protection: None  Lifestyle  . Physical activity    Days per week: Not on file    Minutes per session: Not on file  . Stress: Not on file  Relationships  . Social Herbalist on phone: Not on file    Gets together: Not on file    Attends religious service: Not on file    Active member of club or organization: Not on file    Attends meetings of clubs or organizations: Not on file    Relationship status: Not on file  Other Topics Concern  . Not on file  Social History Narrative  . Not on file    Allergies:  Allergies  Allergen Reactions  . Prozac [Fluoxetine]     Suicidal thoughts  . Topiramate Other (See Comments)    Topamax-dizziness  . Lexapro [Escitalopram Oxalate] Other (See Comments)    Flat affect, No emotions    Metabolic Disorder Labs: Lab Results  Component Value Date   HGBA1C 5.4 01/07/2013   MPG 108 01/07/2013   Lab Results  Component Value Date    PROLACTIN 13.2 06/12/2016   Lab Results  Component Value Date   CHOL 171 10/08/2018   TRIG 200 (H) 10/08/2018   HDL 51 10/08/2018   CHOLHDL 3.4 10/08/2018   VLDL 31 01/07/2013   LDLCALC 80 10/08/2018   LDLCALC 90  01/07/2013   Lab Results  Component Value Date   TSH 1.480 03/05/2019   TSH 1.619 01/02/2019    Therapeutic Level Labs: No results found for: LITHIUM No results found for: VALPROATE No components found for:  CBMZ  Current Medications: Current Outpatient Medications  Medication Sig Dispense Refill  . ALPRAZolam (XANAX) 1 MG tablet Take 1 tablet (1 mg total) by mouth 4 (four) times daily. 120 tablet 2  . baclofen (LIORESAL) 10 MG tablet Take 0.5-1 tablets (5-10 mg total) by mouth 3 (three) times daily as needed for muscle spasms. 30 each 1  . Cholecalciferol (VITAMIN D3) 250 MCG (10000 UT) capsule Take 10,000 Units by mouth daily.    . Cyanocobalamin (B-12 PO) Take 1 tablet by mouth daily.    . cyclobenzaprine (FLEXERIL) 5 MG tablet Take 1 tablet (5 mg total) by mouth 3 (three) times daily as needed for muscle spasms. 90 tablet 3  . desogestrel-ethinyl estradiol (KARIVA,AZURETTE,MIRCETTE) 0.15-0.02/0.01 MG (21/5) tablet Take 1 tablet by mouth daily. 1 Package 11  . gabapentin (NEURONTIN) 300 MG capsule 1 PO q HS, may increase to 1 PO TID if tolerated 90 capsule 5  . ibuprofen (ADVIL,MOTRIN) 200 MG tablet Take 400-600 mg by mouth every 6 (six) hours as needed for mild pain or moderate pain.    Marland Kitchen levothyroxine (SYNTHROID) 50 MCG tablet TAKE 1 TABLET BY MOUTH ONCE A DAY. 30 tablet 3  . lithium 300 MG tablet Take 300 mg at bedtime for 2 weeks, then increase to 600 mg at bedtime 60 tablet 2  . nabumetone (RELAFEN) 750 MG tablet Take 1 tablet (750 mg total) by mouth 2 (two) times daily as needed. 60 tablet 6  . PARoxetine (PAXIL) 20 MG tablet Take 1 tablet (20 mg total) by mouth daily. 30 tablet 2  . traMADol (ULTRAM) 50 MG tablet Take 1 tablet (50 mg total) by mouth every 6  (six) hours as needed. 30 tablet 0   No current facility-administered medications for this visit.      Musculoskeletal: Strength & Muscle Tone: within normal limits Gait & Station: normal Patient leans: N/A  Psychiatric Specialty Exam: Review of Systems  Constitutional: Positive for malaise/fatigue.  Psychiatric/Behavioral: Positive for depression. The patient is nervous/anxious.   All other systems reviewed and are negative.   There were no vitals taken for this visit.There is no height or weight on file to calculate BMI.  General Appearance: Casual and Fairly Groomed  Eye Contact:  Good  Speech:  Clear and Coherent  Volume:  Normal  Mood:  Anxious  Affect:  Appropriate and Congruent  Thought Process:  Goal Directed  Orientation:  Full (Time, Place, and Person)  Thought Content: Rumination   Suicidal Thoughts:  No  Homicidal Thoughts:  No  Memory:  Immediate;   Good Recent;   Good Remote;   Good  Judgement:  Good  Insight:  Fair  Psychomotor Activity:  Decreased  Concentration:  Concentration: Good and Attention Span: Good  Recall:  Good  Fund of Knowledge: Good  Language: Good  Akathisia:  No  Handed:  Right  AIMS (if indicated): not done  Assets:  Communication Skills Desire for Improvement Resilience Social Support Talents/Skills  ADL's:  Intact  Cognition: WNL  Sleep:  Good   Screenings: Mini-Mental     Office Visit from 10/25/2016 in [facility]  Total Score (max 30 points )  29    PHQ2-9     Office Visit from 10/08/2018 in [facility] Office Visit from 08/21/2017 in [place] Office Visit from 07/18/2017 in [facility] Counselor from 07/10/2017 in [place] Office Visit from 07/03/2017 in [facility]  PHQ-2 Total Score  6  4  0  5  0  PHQ-9 Total Score  21  21  -  22  -       Assessment and Plan: This patient is a 38 year old  female who has had long-term dysthymia depression has many somatic complaints that if not falling into one coherent diagnosis.  She has been on numerous medications.  Right now she is on Paxil 20 mg at bedtime, Xanax 1 mg 4 times daily for anxiety and lithium carbonate 600 mg at bedtime.  We will check her lithium level this week and make adjustments as needed but for now we will continue with her current medications.  She will return to see me in 4 weeks   [name], MD 09/02/2019, 10:34 AM

## 2019-09-03 ENCOUNTER — Other Ambulatory Visit: Payer: Self-pay

## 2019-09-03 ENCOUNTER — Other Ambulatory Visit (HOSPITAL_COMMUNITY)
Admission: RE | Admit: 2019-09-03 | Discharge: 2019-09-03 | Disposition: A | Discharge status: Home / Self Care | Payer: Medicare Other | Source: Ambulatory Visit | Attending: Psychiatry | Admitting: Psychiatry

## 2019-09-03 ENCOUNTER — Other Ambulatory Visit (HOSPITAL_COMMUNITY): Payer: Self-pay | Admitting: Psychiatry

## 2019-09-03 DIAGNOSIS — F332 Major depressive disorder, recurrent severe without psychotic features: Secondary | ICD-10-CM

## 2019-09-03 LAB — LITHIUM LEVEL: Lithium Lvl: 0.24 mmol/L — ABNORMAL LOW (ref 0.60–1.20)

## 2019-09-03 NOTE — Progress Notes (Signed)
Patient called.  Patient aware. Patient was informed that lithium level is too low.  She states she has only been taking 300 mg at bedtime but I had assumed that she had been taking 600 mg.  She will go up to 600 mg starting tonight and will recheck a lithium level in 2 weeks

## 2019-09-11 ENCOUNTER — Other Ambulatory Visit: Payer: Self-pay | Admitting: Obstetrics & Gynecology

## 2019-09-11 ENCOUNTER — Ambulatory Visit: Payer: Medicare Other | Admitting: Adult Health

## 2019-09-19 ENCOUNTER — Ambulatory Visit: Payer: Medicare Other | Admitting: "Endocrinology

## 2019-09-30 ENCOUNTER — Other Ambulatory Visit: Payer: Self-pay

## 2019-09-30 ENCOUNTER — Ambulatory Visit (INDEPENDENT_AMBULATORY_CARE_PROVIDER_SITE_OTHER): Payer: Medicare Other | Admitting: Psychiatry

## 2019-09-30 ENCOUNTER — Telehealth (HOSPITAL_COMMUNITY): Payer: Self-pay

## 2019-09-30 ENCOUNTER — Encounter (HOSPITAL_COMMUNITY): Payer: Self-pay | Admitting: Psychiatry

## 2019-09-30 DIAGNOSIS — F332 Major depressive disorder, recurrent severe without psychotic features: Secondary | ICD-10-CM

## 2019-09-30 MED ORDER — LITHIUM CARBONATE 300 MG PO TABS: 600.0000 mg | ORAL_TABLET | Freq: Every day | ORAL | 2 refills | Status: DC

## 2019-09-30 MED ORDER — PAROXETINE HCL 20 MG PO TABS: 20.0000 mg | ORAL_TABLET | Freq: Every day | ORAL | 2 refills | Status: DC

## 2019-09-30 MED ORDER — ALPRAZOLAM 1 MG PO TABS: 1.0000 mg | ORAL_TABLET | Freq: Four times a day (QID) | ORAL | 2 refills | Status: DC

## 2019-09-30 NOTE — Progress Notes (Signed)
Virtual Visit via Video Note  I connected with Destiny Orr on 09/30/19 at 10:40 AM EST by a video enabled telemedicine application and verified that I am speaking with the correct person using two identifiers.   I discussed the limitations of evaluation and management by telemedicine and the availability of in person appointments. The patient expressed understanding and agreed to proceed.   I discussed the assessment and treatment plan with the patient. The patient was provided an opportunity to ask questions and all were answered. The patient agreed with the plan and demonstrated an understanding of the instructions.   The patient was advised to call back or seek an in-person evaluation if the symptoms worsen or if the condition fails to improve as anticipated.  I provided 15 minutes of non-face-to-face time during this encounter.   [name], MD  [facility] MD/PA/NP OP Progress Note  09/30/2019 11:18 AM Destiny Orr  MRN:  [id number]  Chief Complaint:  Chief Complaint    Depression; Anxiety; Follow-up     HPI: This patient is a 38 year old separated white female who lives with her parents 2 daughters and 1 son in [place].  She is on disability from [employer].  The patient returns after 4 weeks for follow-up of depression and anxiety.  She states she has been very stressed.  Her 87 year old daughter has been seeing a boyfriend and they are very closely involved.  She thought at one point that her daughter could be pregnant and the daughter had a pregnancy test which was negative and then had another one 3 weeks later which is also negative.  The patient has set down rules about letting the boyfriend come over and the daughter "follow these.  She is very worried about all of this and realized that she has to set limits with the daughter.  The daughter is threatened to move out when she turns 39.  The patient is now on lithium carbonate 600 mg at bedtime but has not yet checked the level.   When she was on 300 mg a level was 0.24 which is too low.  She does not see much difference in her mood although she seems more animated and talkative.  She denies direct suicidal ideation but claims "it would not matter if I died in my sleep."  She still has significant fear about leaving the house and does not want to be seen by other people and gets "drenched in sweat" when she goes into a store.  She is on a fair amount of Xanax and also takes paroxetine.  I will get her set up with a new therapist here so she can continue to work on methods to handle her anxiety. Visit Diagnosis:    ICD-10-CM   1. Major depressive disorder, recurrent, severe without psychotic features ([place])  F33.2     Past Psychiatric History: Outpatient treatment for depression and anxiety  Past Medical History:  Past Medical History:  Diagnosis Date  . Abnormal Papanicolaou smear of cervix with positive human papilloma virus (HPV) test 10/11/2018   LSIL +HPV, get colpo___  . Abnormal uterine bleeding (AUB) 12/09/2014  . Anxiety   . Arthritis   . Depression   . DJD (degenerative joint disease)   . Dumping syndrome   . Dysmenorrhea 07/15/2014  . Fatigue 12/24/2012  . Fibromyalgia diagnosed April 2016  . Headache   . Hx of migraine headaches 12/24/2012  . Inappropriate sinus tachycardia   . Irregular menstrual bleeding 07/15/2014  . Pelvic  pain in female 03/03/2016  . Pneumothorax, spontaneous, tension   . Post concussion syndrome   . POTS (postural orthostatic tachycardia syndrome)   . PTSD (post-traumatic stress disorder)   . Scoliosis   . Thyroid disease   . Tick bite 03/03/2016  . Vitamin B 12 deficiency     Past Surgical History:  Procedure Laterality Date  . bone spurs toes Right   . CESAREAN SECTION    . COLONOSCOPY    . endooscopy    . KNEE SURGERY    . rotate cuff lt arm      Family Psychiatric History: see below  Family History:  Family History  Problem Relation Age of Onset  . Hypertension  Father   . Atrial fibrillation Father   . Alcohol abuse Father   . Cancer Maternal Grandmother        skin   . Heart disease Maternal Grandmother   . Other Maternal Grandmother        had thyroid removed  . Breast cancer Maternal Grandmother   . Heart disease Paternal Grandfather   . COPD Paternal Grandfather   . Hypertension Paternal Grandfather   . Diabetes Paternal Grandfather   . Stroke Paternal Grandfather   . Cancer Maternal Grandfather        bladder,lung  . Anxiety disorder Maternal Aunt   . Anxiety disorder Maternal Uncle   . Bipolar disorder Maternal Uncle   . Breast cancer Maternal Uncle        CML  . Leukemia Other   . Colon cancer Other        lung-2 maternal great uncles    Social History:  Social History   Socioeconomic History  . Marital status: Legally Separated    Spouse name: Not on file  . Number of children: 3  . Years of education: Not on file  . Highest education level: Not on file  Occupational History  . Not on file  Tobacco Use  . Smoking status: Current Every Day Smoker    Packs/day: 1.00    Years: 15.00    Pack years: 15.00    Types: Cigarettes    Start date: 03/13/1997  . Smokeless tobacco: Never Used  . Tobacco comment: "working on it" Was smoking 2.5 packs a day  Substance and Sexual Activity  . Alcohol use: No  . Drug use: No  . Sexual activity: Yes    Partners: Male    Birth control/protection: None  Other Topics Concern  . Not on file  Social History Narrative  . Not on file   Social Determinants of Health   Financial Resource Strain:   . Difficulty of Paying Living Expenses: Not on file  Food Insecurity:   . Worried About Charity fundraiser in the Last Year: Not on file  . Ran Out of Food in the Last Year: Not on file  Transportation Needs:   . Lack of Transportation (Medical): Not on file  . Lack of Transportation (Non-Medical): Not on file  Physical Activity:   . Days of Exercise per Week: Not on file  . Minutes  of Exercise per Session: Not on file  Stress:   . Feeling of Stress : Not on file  Social Connections:   . Frequency of Communication with Friends and Family: Not on file  . Frequency of Social Gatherings with Friends and Family: Not on file  . Attends Religious Services: Not on file  . Active Member of Clubs or Organizations:  Not on file  . Attends Archivist Meetings: Not on file  . Marital Status: Not on file    Allergies:  Allergies  Allergen Reactions  . Prozac [Fluoxetine]     Suicidal thoughts  . Topiramate Other (See Comments)    Topamax-dizziness  . Lexapro [Escitalopram Oxalate] Other (See Comments)    Flat affect, No emotions    Metabolic Disorder Labs: Lab Results  Component Value Date   HGBA1C 5.4 01/07/2013   MPG 108 01/07/2013   Lab Results  Component Value Date   PROLACTIN 13.2 06/12/2016   Lab Results  Component Value Date   CHOL 171 10/08/2018   TRIG 200 (H) 10/08/2018   HDL 51 10/08/2018   CHOLHDL 3.4 10/08/2018   VLDL 31 01/07/2013   LDLCALC 80 10/08/2018   LDLCALC 90 01/07/2013   Lab Results  Component Value Date   TSH 1.480 03/05/2019   TSH 1.619 01/02/2019    Therapeutic Level Labs: Lab Results  Component Value Date   LITHIUM 0.24 (L) 09/03/2019   No results found for: VALPROATE No components found for:  CBMZ  Current Medications: Current Outpatient Medications  Medication Sig Dispense Refill  . ALPRAZolam (XANAX) 1 MG tablet Take 1 tablet (1 mg total) by mouth 4 (four) times daily. 120 tablet 2  . baclofen (LIORESAL) 10 MG tablet Take 0.5-1 tablets (5-10 mg total) by mouth 3 (three) times daily as needed for muscle spasms. 30 each 1  . Cholecalciferol (VITAMIN D3) 250 MCG (10000 UT) capsule Take 10,000 Units by mouth daily.    . Cyanocobalamin (B-12 PO) Take 1 tablet by mouth daily.    . cyclobenzaprine (FLEXERIL) 5 MG tablet Take 1 tablet (5 mg total) by mouth 3 (three) times daily as needed for muscle spasms. 90  tablet 3  . desogestrel-ethinyl estradiol (MIRCETTE) 0.15-0.02/0.01 MG (21/5) tablet TAKE (1) TABLET BY MOUTH ONCE DAILY. 28 tablet 12  . gabapentin (NEURONTIN) 300 MG capsule 1 PO q HS, may increase to 1 PO TID if tolerated 90 capsule 5  . ibuprofen (ADVIL,MOTRIN) 200 MG tablet Take 400-600 mg by mouth every 6 (six) hours as needed for mild pain or moderate pain.    Marland Kitchen levothyroxine (SYNTHROID) 50 MCG tablet TAKE 1 TABLET BY MOUTH ONCE A DAY. 30 tablet 3  . lithium 300 MG tablet Take 2 tablets (600 mg total) by mouth at bedtime. 60 tablet 2  . nabumetone (RELAFEN) 750 MG tablet Take 1 tablet (750 mg total) by mouth 2 (two) times daily as needed. 60 tablet 6  . PARoxetine (PAXIL) 20 MG tablet Take 1 tablet (20 mg total) by mouth daily. 30 tablet 2  . traMADol (ULTRAM) 50 MG tablet Take 1 tablet (50 mg total) by mouth every 6 (six) hours as needed. 30 tablet 0   No current facility-administered medications for this visit.     Musculoskeletal: Strength & Muscle Tone: within normal limits Gait & Station: normal Patient leans: N/A  Psychiatric Specialty Exam: Review of Systems  Psychiatric/Behavioral: Positive for dysphoric mood. The patient is nervous/anxious.   All other systems reviewed and are negative.   There were no vitals taken for this visit.There is no height or weight on file to calculate BMI.  General Appearance: Casual and Fairly Groomed  Eye Contact:  Good  Speech:  Clear and Coherent  Volume:  Normal  Mood:  Anxious  Affect:  Constricted  Thought Process:  Goal Directed  Orientation:  Full (Time, Place, and Person)  Thought Content: Rumination   Suicidal Thoughts:  No  Homicidal Thoughts:  No  Memory:  Immediate;   Good Recent;   Good Remote;   Good  Judgement:  Good  Insight:  Fair  Psychomotor Activity:  Normal  Concentration:  Concentration: Good and Attention Span: Good  Recall:  Good  Fund of Knowledge: Good  Language: Good  Akathisia:  No  Handed:  Right   AIMS (if indicated): not done  Assets:  Communication Skills Desire for Improvement Physical Health Resilience Social Support Talents/Skills  ADL's:  Intact  Cognition: WNL  Sleep:  Good   Screenings: Mini-Mental     Office Visit from 10/25/2016 in [facility]  Total Score (max 30 points )  29    PHQ2-9     Office Visit from 10/08/2018 in [place] Office Visit from 08/21/2017 in [place] Office Visit from 07/18/2017 in [facility] Counselor from 07/10/2017 in [place] Office Visit from 07/03/2017 in [facility]  PHQ-2 Total Score  6  4  0  5  0  PHQ-9 Total Score  21  21  --  22  --       Assessment and Plan: This patient is a 38 year old female who has had long-term dysthymia depression and numerous somatic complaints.  She has been on numerous medications for depression and mood stabilization.  For now she will continue Paxil 20 mg at bedtime, Xanax 1 mg 4 times daily for anxiety and lithium carbonate 600 mg at bedtime.  We will need to recheck her lithium level and make adjustments accordingly.  She will return to see me in 4 weeks   [name], MD 09/30/2019, 11:18 AM

## 2019-09-30 NOTE — Telephone Encounter (Signed)
Medication management - Patient came by the office to pick up her Lithium Level order to take to [facility] to have labs drawn as ordered.

## 2019-10-01 ENCOUNTER — Telehealth (HOSPITAL_COMMUNITY): Payer: Self-pay

## 2019-10-01 ENCOUNTER — Telehealth (HOSPITAL_COMMUNITY): Payer: Self-pay | Admitting: Psychiatry

## 2019-10-01 NOTE — Telephone Encounter (Signed)
Medication management - Faxed [facility] patient's MD office notes from Dr. Harrington Challenger 09/30/19 and Dr. Harrington Challenger completed form for their review to number noted.  Copies scanned to [facility] and left for pt copy at office.

## 2019-10-04 IMAGING — MG DIGITAL DIAGNOSTIC BILATERAL MAMMOGRAM WITH TOMO AND CAD
6 of 10 series · 6 of 30 positions shown · non-contrast
Comparison: None.

CLINICAL DATA: 37-year-old female with palpable area of concern.
Patient also reports right nipple discharge.

EXAM:
DIGITAL DIAGNOSTIC BILATERAL MAMMOGRAM WITH CAD AND TOMO
RIGHT BREAST ULTRASOUND

[L CC synth-2D]
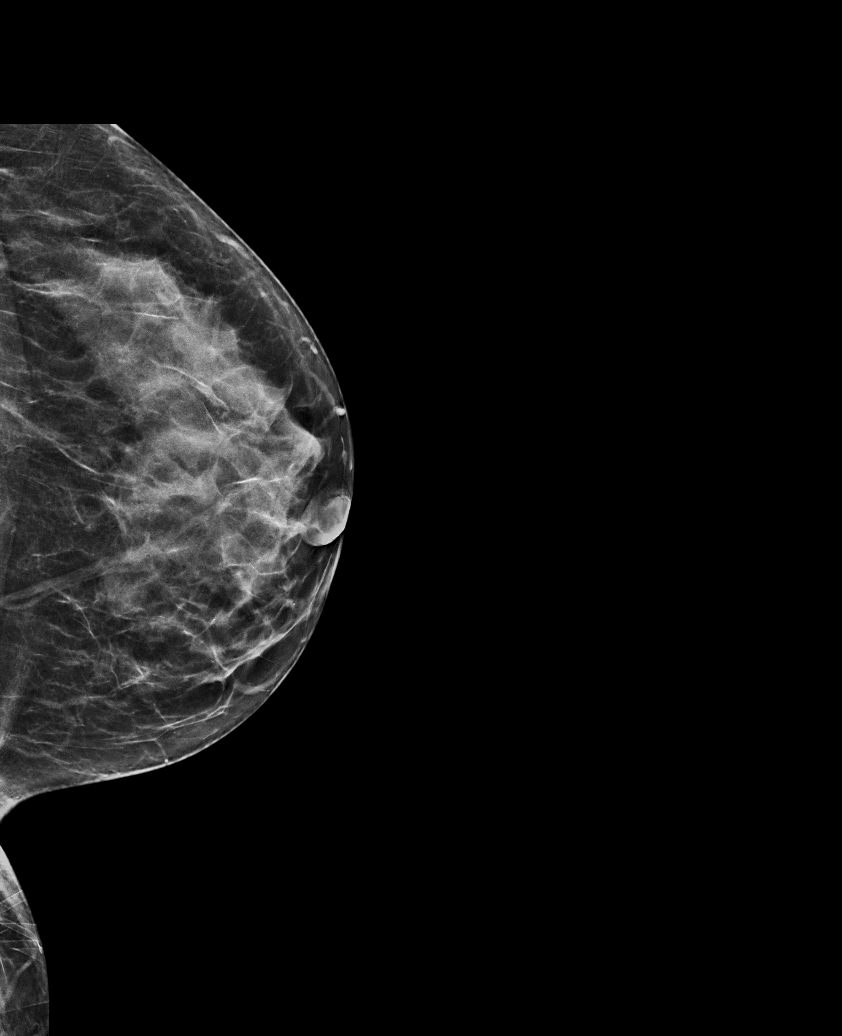

[R CC synth-2D (1 of 2)]
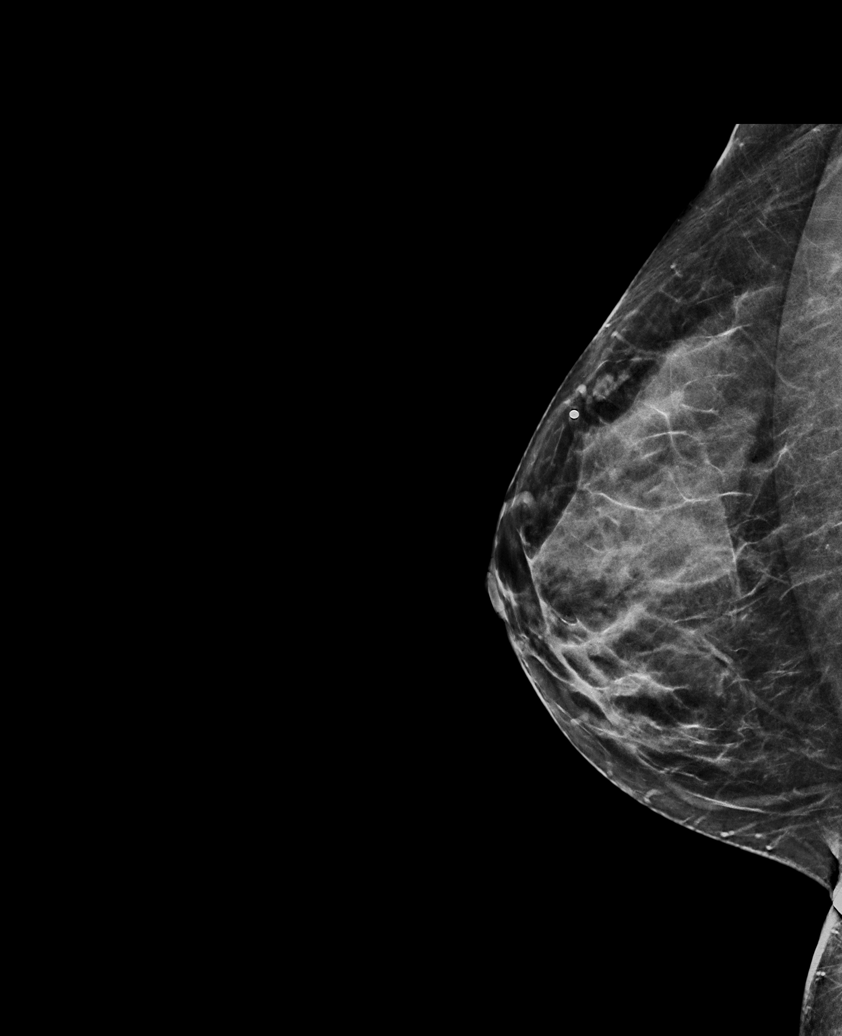

[L MLO synth-2D]
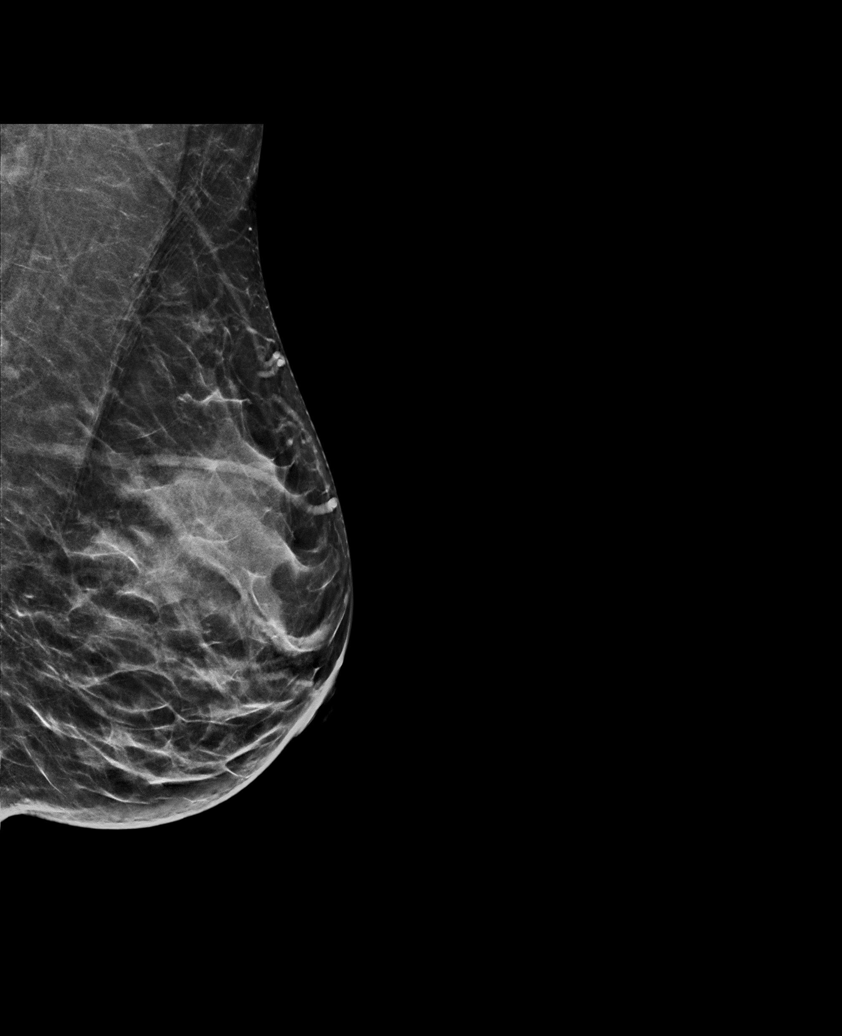

[R CC synth-2D (2 of 2)]
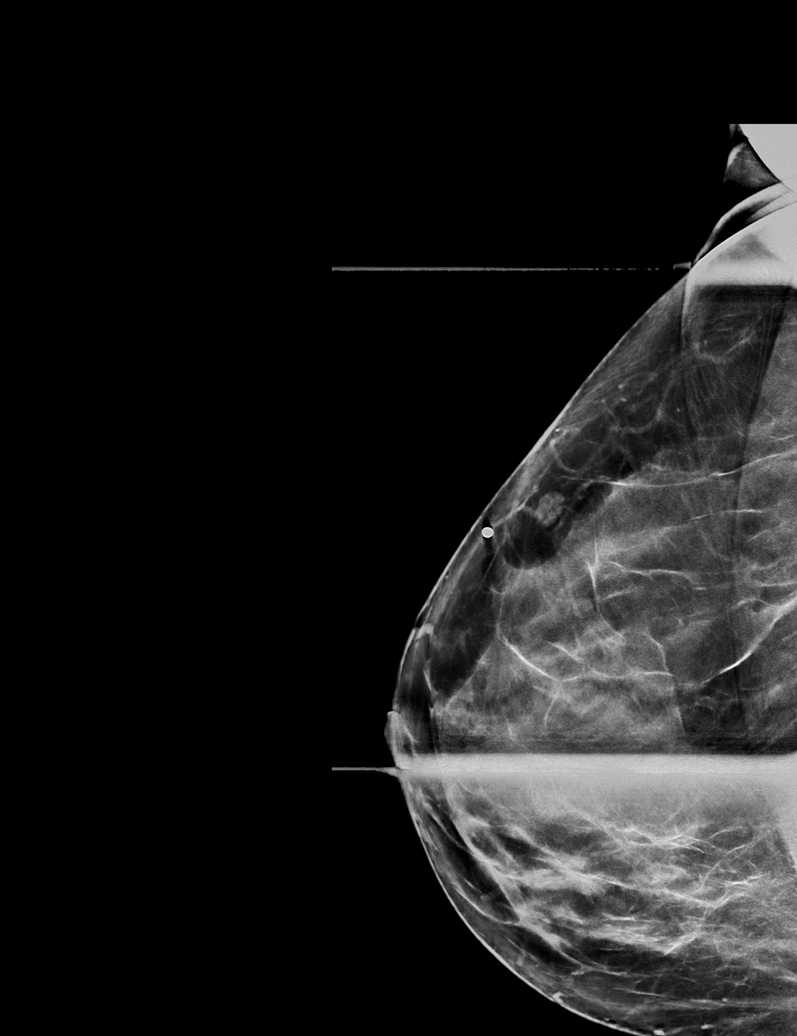

[R MLO synth-2D]
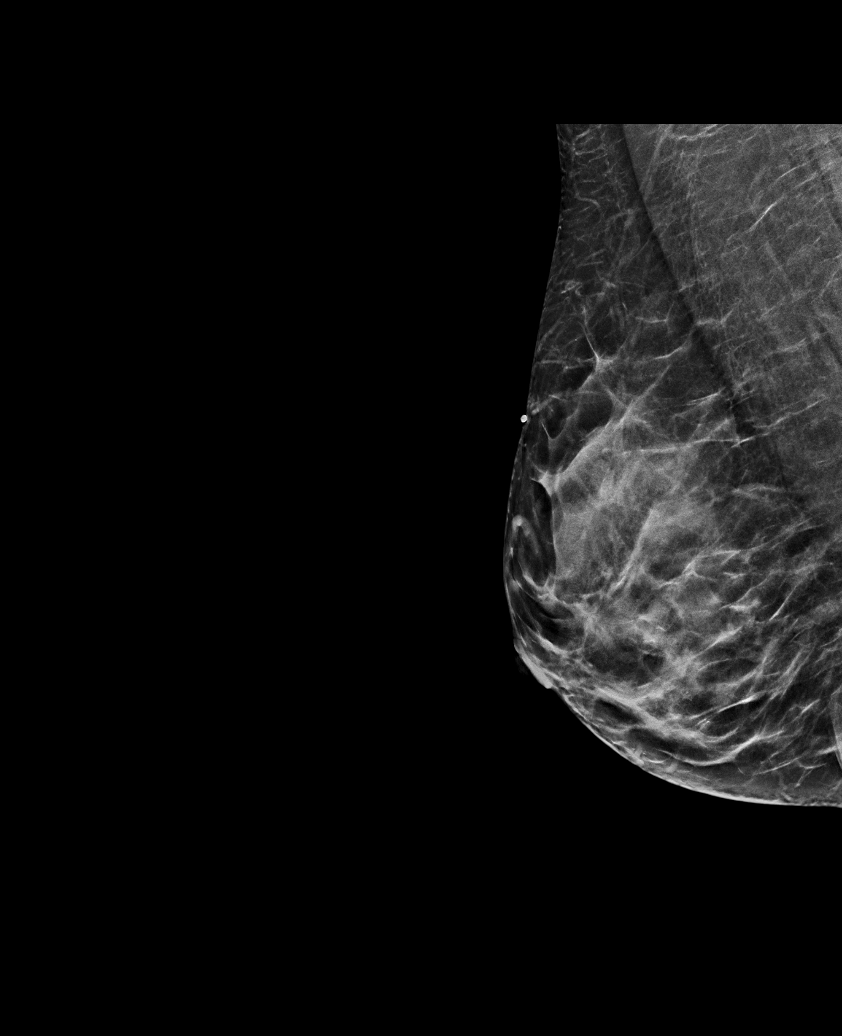

[R CC tomo · tomo slice 32/63.0]
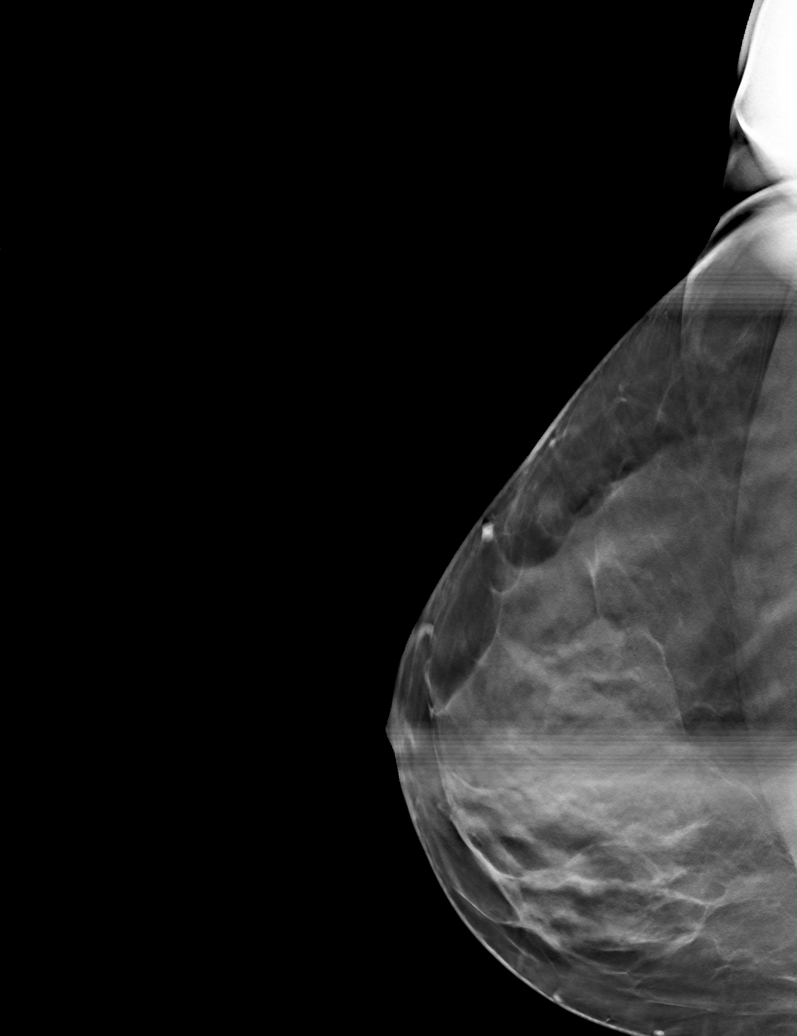

[6 of 30 positions shown; findings below may reference images not displayed]

ACR Breast Density Category d: The breast tissue is extremely dense,
which lowers the sensitivity of mammography.
FINDINGS: No suspicious masses or calcifications are seen in either breast.
Spot compression CC tomograms were performed over the palpable area
of concern with a small oval circumscribed mass containing internal
fat density identified consistent with the intramammary lymph node
in this location. No suspicious abnormalities identified.

Mammographic images were processed with CAD.

Upon physical examination the patient reports generalized tenderness
involving the upper-outer quadrant of the right breast rather than a
specific palpable area of concern. There are no underlying palpable
masses. No nipple discharge identified. The patient states this
discharge she notices on her bra in this tends to be white in color,
and occurs on the left however to a lesser extent.

Targeted ultrasound of the upper-outer quadrant of the right breast
was performed. A small intramammary lymph node is identified at 10
o'clock 3 cm from nipple measuring 0.6 x 0.4 x 0.8 cm. No suspicious
masses or abnormality seen in the upper-outer right breast. Targeted
ultrasound of the subareolar right breast is negative.
IMPRESSION: 1.  No findings of malignancy in either breast.

2. No suspicious abnormalities identified in the region of concern
in the upper-outer right breast.

2. No abnormalities identified to explain nipple discharge, however
this appears to be mostly whitish in color and bilateral and
therefore felt to be physiologic.

RECOMMENDATION:
1. Recommend further management of the right breast area of concern
(tenderness) be based on clinical assessment.

2. Clinical follow-up as needed for nipple discharge. This is felt
to be physiologic given the bilaterality and whitish color, however
if the patient experiences unilateral uniductal spontaneous clear or
bloody nipple discharge then further evaluation with additional
imaging and possible breast MRI would be recommended.

3. Screening mammogram at age 40 unless there are persistent or
intervening clinical concerns. (Code:[id number])

I have discussed the findings and recommendations with the patient.
Results were also provided in writing at the conclusion of the
visit. If applicable, a reminder letter will be sent to the patient
regarding the next appointment.

BI-RADS CATEGORY  2: Benign.

## 2019-10-08 ENCOUNTER — Telehealth (HOSPITAL_COMMUNITY): Payer: Self-pay | Admitting: *Deleted

## 2019-10-08 NOTE — Telephone Encounter (Signed)
[employer] Rx PRIOR AUTHORIZATION 10/06/2019   APPROVED VRAYLAR CAP 1.5 MG  APPROVED THRU 10/01/2020 UNDER MEDICARE PART D BENEFIT

## 2019-10-10 ENCOUNTER — Ambulatory Visit (HOSPITAL_COMMUNITY): Payer: Medicare Other | Admitting: Clinical

## 2019-10-10 ENCOUNTER — Other Ambulatory Visit: Payer: Self-pay

## 2019-10-17 ENCOUNTER — Ambulatory Visit (INDEPENDENT_AMBULATORY_CARE_PROVIDER_SITE_OTHER): Payer: Medicare Other | Admitting: Clinical

## 2019-10-17 ENCOUNTER — Other Ambulatory Visit: Payer: Self-pay

## 2019-10-17 DIAGNOSIS — F0781 Postconcussional syndrome: Secondary | ICD-10-CM

## 2019-10-17 DIAGNOSIS — F4310 Post-traumatic stress disorder, unspecified: Secondary | ICD-10-CM

## 2019-10-17 DIAGNOSIS — F332 Major depressive disorder, recurrent severe without psychotic features: Secondary | ICD-10-CM
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
## 2019-10-24 ENCOUNTER — Other Ambulatory Visit (HOSPITAL_COMMUNITY)
Admission: RE | Admit: 2019-10-24 | Discharge: 2019-10-24 | Disposition: A | Discharge status: Home / Self Care | Payer: Medicare Other | Source: Ambulatory Visit | Attending: "Endocrinology | Admitting: "Endocrinology

## 2019-10-24 DIAGNOSIS — E559 Vitamin D deficiency, unspecified: Secondary | ICD-10-CM | POA: Diagnosis not present

## 2019-10-24 DIAGNOSIS — E039 Hypothyroidism, unspecified: Secondary | ICD-10-CM | POA: Insufficient documentation

## 2019-10-24 LAB — TSH: TSH: 1.971 u[IU]/mL (ref 0.350–4.500)

## 2019-10-24 LAB — T4, FREE: Free T4: 0.73 ng/dL (ref 0.61–1.12)

## 2019-10-24 LAB — VITAMIN D 25 HYDROXY (VIT D DEFICIENCY, FRACTURES): Vit D, 25-Hydroxy: 24.09 ng/mL — ABNORMAL LOW (ref 30–100)

## 2019-10-29 ENCOUNTER — Other Ambulatory Visit: Payer: Self-pay

## 2019-10-29 ENCOUNTER — Encounter (HOSPITAL_COMMUNITY): Payer: Self-pay | Admitting: Psychiatry

## 2019-10-29 ENCOUNTER — Ambulatory Visit (INDEPENDENT_AMBULATORY_CARE_PROVIDER_SITE_OTHER): Payer: Medicare Other | Admitting: Psychiatry

## 2019-10-29 DIAGNOSIS — F4310 Post-traumatic stress disorder, unspecified: Secondary | ICD-10-CM

## 2019-10-29 DIAGNOSIS — F332 Major depressive disorder, recurrent severe without psychotic features: Secondary | ICD-10-CM

## 2019-10-29 MED ORDER — ALPRAZOLAM 1 MG PO TABS: 1.0000 mg | ORAL_TABLET | Freq: Four times a day (QID) | ORAL | 2 refills | Status: DC

## 2019-10-29 MED ORDER — PAROXETINE HCL 20 MG PO TABS: 20.0000 mg | ORAL_TABLET | Freq: Every day | ORAL | 2 refills | Status: DC

## 2019-10-29 MED ORDER — LITHIUM CARBONATE 300 MG PO TABS: 300.0000 mg | ORAL_TABLET | Freq: Every day | ORAL | 2 refills | Status: DC

## 2019-10-29 NOTE — Progress Notes (Signed)
Virtual Visit via Video Note  I connected with Destiny Orr on 10/29/19 at 11:40 AM EST by a video enabled telemedicine application and verified that I am speaking with the correct person using two identifiers.   I discussed the limitations of evaluation and management by telemedicine and the availability of in person appointments. The patient expressed understanding and agreed to proceed.   I discussed the assessment and treatment plan with the patient. The patient was provided an opportunity to ask questions and all were answered. The patient agreed with the plan and demonstrated an understanding of the instructions.   The patient was advised to call back or seek an in-person evaluation if the symptoms worsen or if the condition fails to improve as anticipated.  I provided 15 minutes of non-face-to-face time during this encounter.   [name], MD  [facility] MD/PA/NP OP Progress Note  10/29/2019 11:58 AM Destiny Orr  MRN:  [id number]  Chief Complaint: depression HPI: This patient is a 39 year old separated white female who lives with her parents 2 daughters and 1 son in [place].  She is on disability from [employer].  The patient returns after 4 weeks.  She tells me that she has been having chronic headache and she thought it was from lithium.  She totally stopped the lithium a couple of weeks ago but the headache still continues.  It is all over the crown of her head.  It sounds as if it could be a tension headache.  She thinks the lithium did help her mood and since the headache does not seem to be related to it she would like to go back on it.  She denies being suicidal.  She and her son have been exercising and she is trying to lose some weight.  She seems to be a bit more positive.  She still does not like going out around people but at least she is trying to go outdoors to walk.  She does think the Paxil has helped to some degree but the addition of lithium gave her more energy. Visit  Diagnosis:    ICD-10-CM   1. Major depressive disorder, recurrent, severe without psychotic features ([place])  F33.2   2. PTSD (post-traumatic stress disorder)  F43.10     Past Psychiatric History: Long-term outpatient treatment for depression and anxiety  Past Medical History:  Past Medical History:  Diagnosis Date  . Abnormal Papanicolaou smear of cervix with positive human papilloma virus (HPV) test 10/11/2018   LSIL +HPV, get colpo___  . Abnormal uterine bleeding (AUB) 12/09/2014  . Anxiety   . Arthritis   . Depression   . DJD (degenerative joint disease)   . Dumping syndrome   . Dysmenorrhea 07/15/2014  . Fatigue 12/24/2012  . Fibromyalgia diagnosed April 2016  . Headache   . Hx of migraine headaches 12/24/2012  . Inappropriate sinus tachycardia   . Irregular menstrual bleeding 07/15/2014  . Pelvic pain in female 03/03/2016  . Pneumothorax, spontaneous, tension   . Post concussion syndrome   . POTS (postural orthostatic tachycardia syndrome)   . PTSD (post-traumatic stress disorder)   . Scoliosis   . Thyroid disease   . Tick bite 03/03/2016  . Vitamin B 12 deficiency     Past Surgical History:  Procedure Laterality Date  . bone spurs toes Right   . CESAREAN SECTION    . COLONOSCOPY    . endooscopy    . KNEE SURGERY    . rotate cuff lt arm  Family Psychiatric History: see below  Family History:  Family History  Problem Relation Age of Onset  . Hypertension Father   . Atrial fibrillation Father   . Alcohol abuse Father   . Cancer Maternal Grandmother        skin   . Heart disease Maternal Grandmother   . Other Maternal Grandmother        had thyroid removed  . Breast cancer Maternal Grandmother   . Heart disease Paternal Grandfather   . COPD Paternal Grandfather   . Hypertension Paternal Grandfather   . Diabetes Paternal Grandfather   . Stroke Paternal Grandfather   . Cancer Maternal Grandfather        bladder,lung  . Anxiety disorder Maternal Aunt   .  Anxiety disorder Maternal Uncle   . Bipolar disorder Maternal Uncle   . Breast cancer Maternal Uncle        CML  . Leukemia Other   . Colon cancer Other        lung-2 maternal great uncles    Social History:  Social History   Socioeconomic History  . Marital status: Legally Separated    Spouse name: Not on file  . Number of children: 3  . Years of education: Not on file  . Highest education level: Not on file  Occupational History  . Not on file  Tobacco Use  . Smoking status: Current Every Day Smoker    Packs/day: 1.00    Years: 15.00    Pack years: 15.00    Types: Cigarettes    Start date: 03/13/1997  . Smokeless tobacco: Never Used  . Tobacco comment: "working on it" Was smoking 2.5 packs a day  Substance and Sexual Activity  . Alcohol use: No  . Drug use: No  . Sexual activity: Yes    Partners: Male    Birth control/protection: None  Other Topics Concern  . Not on file  Social History Narrative  . Not on file   Social Determinants of Health   Financial Resource Strain:   . Difficulty of Paying Living Expenses: Not on file  Food Insecurity:   . Worried About Charity fundraiser in the Last Year: Not on file  . Ran Out of Food in the Last Year: Not on file  Transportation Needs:   . Lack of Transportation (Medical): Not on file  . Lack of Transportation (Non-Medical): Not on file  Physical Activity:   . Days of Exercise per Week: Not on file  . Minutes of Exercise per Session: Not on file  Stress:   . Feeling of Stress : Not on file  Social Connections:   . Frequency of Communication with Friends and Family: Not on file  . Frequency of Social Gatherings with Friends and Family: Not on file  . Attends Religious Services: Not on file  . Active Member of Clubs or Organizations: Not on file  . Attends Archivist Meetings: Not on file  . Marital Status: Not on file    Allergies:  Allergies  Allergen Reactions  . Prozac [Fluoxetine]      Suicidal thoughts  . Topiramate Other (See Comments)    Topamax-dizziness  . Lexapro [Escitalopram Oxalate] Other (See Comments)    Flat affect, No emotions    Metabolic Disorder Labs: Lab Results  Component Value Date   HGBA1C 5.4 01/07/2013   MPG 108 01/07/2013   Lab Results  Component Value Date   PROLACTIN 13.2 06/12/2016   Lab  Results  Component Value Date   CHOL 171 10/08/2018   TRIG 200 (H) 10/08/2018   HDL 51 10/08/2018   CHOLHDL 3.4 10/08/2018   VLDL 31 01/07/2013   LDLCALC 80 10/08/2018   LDLCALC 90 01/07/2013   Lab Results  Component Value Date   TSH 1.971 10/24/2019   TSH 1.480 03/05/2019    Therapeutic Level Labs: Lab Results  Component Value Date   LITHIUM 0.24 (L) 09/03/2019   No results found for: VALPROATE No components found for:  CBMZ  Current Medications: Current Outpatient Medications  Medication Sig Dispense Refill  . ALPRAZolam (XANAX) 1 MG tablet Take 1 tablet (1 mg total) by mouth 4 (four) times daily. 120 tablet 2  . baclofen (LIORESAL) 10 MG tablet Take 0.5-1 tablets (5-10 mg total) by mouth 3 (three) times daily as needed for muscle spasms. 30 each 1  . Cholecalciferol (VITAMIN D3) 250 MCG (10000 UT) capsule Take 10,000 Units by mouth daily.    . Cyanocobalamin (B-12 PO) Take 1 tablet by mouth daily.    . cyclobenzaprine (FLEXERIL) 5 MG tablet Take 1 tablet (5 mg total) by mouth 3 (three) times daily as needed for muscle spasms. 90 tablet 3  . desogestrel-ethinyl estradiol (MIRCETTE) 0.15-0.02/0.01 MG (21/5) tablet TAKE (1) TABLET BY MOUTH ONCE DAILY. 28 tablet 12  . gabapentin (NEURONTIN) 300 MG capsule 1 PO q HS, may increase to 1 PO TID if tolerated 90 capsule 5  . ibuprofen (ADVIL,MOTRIN) 200 MG tablet Take 400-600 mg by mouth every 6 (six) hours as needed for mild pain or moderate pain.    Marland Kitchen levothyroxine (SYNTHROID) 50 MCG tablet TAKE 1 TABLET BY MOUTH ONCE A DAY. 30 tablet 3  . lithium 300 MG tablet Take 1 tablet (300 mg total)  by mouth at bedtime. 30 tablet 2  . nabumetone (RELAFEN) 750 MG tablet Take 1 tablet (750 mg total) by mouth 2 (two) times daily as needed. 60 tablet 6  . PARoxetine (PAXIL) 20 MG tablet Take 1 tablet (20 mg total) by mouth daily. 30 tablet 2   No current facility-administered medications for this visit.     Musculoskeletal: Strength & Muscle Tone: within normal limits Gait & Station: normal Patient leans: N/A  Psychiatric Specialty Exam: Review of Systems  Constitutional: Positive for fatigue.  Neurological: Positive for headaches.  Psychiatric/Behavioral: Positive for dysphoric mood. The patient is nervous/anxious.   All other systems reviewed and are negative.   There were no vitals taken for this visit.There is no height or weight on file to calculate BMI.  General Appearance: Casual and Guarded  Eye Contact:  Good  Speech:  Clear and Coherent  Volume:  Normal  Mood:  Dysphoric  Affect:  Appropriate and Congruent  Thought Process:  Goal Directed  Orientation:  Full (Time, Place, and Person)  Thought Content: Rumination   Suicidal Thoughts:  No  Homicidal Thoughts:  No  Memory:  Immediate;   Good Recent;   Good Remote;   Fair  Judgement:  Good  Insight:  Good  Psychomotor Activity:  Decreased  Concentration:  Concentration: Good and Attention Span: Good  Recall:  Good  Fund of Knowledge: Good  Language: Good  Akathisia:  No  Handed:  Right  AIMS (if indicated): not done  Assets:  Communication Skills Desire for Improvement Resilience Social Support Talents/Skills  ADL's:  Intact  Cognition: WNL  Sleep:  Good   Screenings: Mini-Mental     Office Visit from 10/25/2016 in [facility]  Total Score (max 30 points )  29    PHQ2-9     Office Visit from 10/08/2018 in [facility] Visit from 08/21/2017 in [facility] Office Visit from 07/18/2017 in [facility] Counselor from 07/10/2017 in [place] Office Visit from 07/03/2017 in [facility]  PHQ-2 Total Score  6  4  0  5  0  PHQ-9 Total Score  21  21  --  22  --       Assessment and Plan: This patient is a 39 year old female has long-term dysthymia depression number somatic complaints she has been on quite a few medications for depression mood stabilization.  She stopped lithium because she thought it was causing headaches but even without it but headaches persist.  She thinks the lithium was helping her mood so we will restarted at 300 mg daily.  Her level at this dosage was 0.24 last time we checked.  She will continue Paxil 20 mg at bedtime for depression and Xanax 1 mg 4 times daily for anxiety.  She will continue her counseling here and return to see me in 4 weeks   [name], MD 10/29/2019, 11:58 AM

## 2019-11-11 ENCOUNTER — Telehealth (HOSPITAL_COMMUNITY): Payer: Self-pay | Admitting: Clinical

## 2019-11-11 ENCOUNTER — Ambulatory Visit (INDEPENDENT_AMBULATORY_CARE_PROVIDER_SITE_OTHER): Payer: Medicare Other | Admitting: Clinical

## 2019-11-11 ENCOUNTER — Other Ambulatory Visit: Payer: Self-pay

## 2019-11-11 DIAGNOSIS — F4310 Post-traumatic stress disorder, unspecified: Secondary | ICD-10-CM

## 2019-11-11 DIAGNOSIS — F332 Major depressive disorder, recurrent severe without psychotic features: Secondary | ICD-10-CM

## 2019-11-11 NOTE — Progress Notes (Signed)
Virtual Visit via Telephone Note  I connected with Destiny Orr on 11/11/19 at 11:00 AM EST by telephone and verified that I am speaking with the correct person using two identifiers.  Location: Patient: Home Provider: Office   I discussed the limitations, risks, security and privacy concerns of performing an evaluation and management service by telephone and the availability of in person appointments. I also discussed with the patient that there may be a patient responsible charge related to this service. The patient expressed understanding and agreed to proceed.            THERAPIST PROGRESS NOTE  Session Time: 11:15 AM-11:45 AM  Participation Level: Active  Behavioral Response: CasualAlertDepressed  Type of Therapy: Individual Therapy  Treatment Goals addressed: Diagnosis: Depression  Interventions: CBT  Summary: Destiny Orr is a 39 y.o. female who presents with Depression and PTSD.The OPT therapist worked with the patient for her initial session. The OPT therapist utilized Motivational Interviewing to assist in creating therapeutic repore. The patient in the session was engaged and work in collaboration giving feedback about her triggers and symptoms over the past few weeks including conflict with her daughter and family dynamics. The OPT therapist utilized Cognitive Behavioral Therapy through cognitive restructuring as well as worked with the patient on coping strategies to assist in management of Depression.   Suicidal/Homicidal: Nowithout intent/plan  Therapist Response: The OPT therapist worked with the patient for the patients initial scheduled session. The patient was engaged in her session and gave feedback in relation to triggers, symptoms, and behavior responses over the past 2 weeks. The OPT therapist worked with the patient utilizing an in session Cognitive Behavioral Therapy exercise. The patient was responsive in the session and verbalized, " I am willing to think out  my options and use pros and cons to help make decisions".. The OPT therapist will continue treatment work with the patient in her next scheduled session.  Plan: Return again in 3 weeks.  Diagnosis: Axis I: Major depressive disorder, recurrent, severe without psychotic features and PTSD (post-traumatic stress disorder)    Axis II: No diagnosis  I discussed the assessment and treatment plan with the patient. The patient was provided an opportunity to ask questions and all were answered. The patient agreed with the plan and demonstrated an understanding of the instructions.   The patient was advised to call back or seek an in-person evaluation if the symptoms worsen or if the condition fails to improve as anticipated.  I provided 30 minutes of non-face-to-face time during this encounter.  Lennox Grumbles, LCSW 11/11/2019

## 2019-11-25 ENCOUNTER — Other Ambulatory Visit: Payer: Self-pay

## 2019-11-25 ENCOUNTER — Ambulatory Visit (INDEPENDENT_AMBULATORY_CARE_PROVIDER_SITE_OTHER): Payer: Medicare Other | Admitting: Psychiatry

## 2019-11-25 ENCOUNTER — Encounter (HOSPITAL_COMMUNITY): Payer: Self-pay | Admitting: Psychiatry

## 2019-11-25 DIAGNOSIS — F332 Major depressive disorder, recurrent severe without psychotic features: Secondary | ICD-10-CM

## 2019-11-25 DIAGNOSIS — F4310 Post-traumatic stress disorder, unspecified: Secondary | ICD-10-CM | POA: Diagnosis not present

## 2019-11-25 MED ORDER — PAROXETINE HCL 20 MG PO TABS: 20.0000 mg | ORAL_TABLET | Freq: Every day | ORAL | 2 refills | Status: DC

## 2019-11-25 MED ORDER — ALPRAZOLAM 1 MG PO TABS: 1.0000 mg | ORAL_TABLET | Freq: Four times a day (QID) | ORAL | 2 refills | Status: DC

## 2019-11-25 MED ORDER — LITHIUM CARBONATE 300 MG PO TABS: 600.0000 mg | ORAL_TABLET | Freq: Every day | ORAL | 2 refills | Status: DC

## 2019-11-25 NOTE — Progress Notes (Signed)
Virtual Visit via Video Note  I connected with Destiny Orr on 11/25/19 at 11:00 AM EST by a video enabled telemedicine application and verified that I am speaking with the correct person using two identifiers.   I discussed the limitations of evaluation and management by telemedicine and the availability of in person appointments. The patient expressed understanding and agreed to proceed.    I discussed the assessment and treatment plan with the patient. The patient was provided an opportunity to ask questions and all were answered. The patient agreed with the plan and demonstrated an understanding of the instructions.   The patient was advised to call back or seek an in-person evaluation if the symptoms worsen or if the condition fails to improve as anticipated.  I provided 15 minutes of non-face-to-face time during this encounter.   [name], MD  [facility] MD/PA/NP OP Progress Note  11/25/2019 11:13 AM Destiny Orr  MRN:  [id number]  Chief Complaint:  Chief Complaint    Depression; Anxiety; Follow-up     HPI: This patient is a 39 year old separated white female who lives with her parents, 2 daughters and 1 son in [place].  She is on disability from [employer].  The patient returns after 4 weeks.  Last time she stated that she wanted to retry lithium.  She thought it was causing headache but when she went off it for several weeks the headaches persisted.  Her headache is now better but she has a tooth ache.  She is back on lithium 300 mg daily and is not seeing too much benefit yet but would like to go up to the 600 mg at bedtime.  She states that she has not been doing too much.  She had recent labs and her vitamin D is still low even with supplementation.  I urged her to get out in the sunlight as much as she can.  She does think the Paxil has helped to some degree with her mood.  She has just started seeing Maye Hides here for therapy.  She denies suicidal ideation.  The Xanax  continues to help with her anxiety. Visit Diagnosis:    ICD-10-CM   1. Major depressive disorder, recurrent, severe without psychotic features ([place])  F33.2 Lithium level  2. PTSD (post-traumatic stress disorder)  F43.10     Past Psychiatric History: Long-term outpatient treatment for depression and anxiety  Past Medical History:  Past Medical History:  Diagnosis Date  . Abnormal Papanicolaou smear of cervix with positive human papilloma virus (HPV) test 10/11/2018   LSIL +HPV, get colpo___  . Abnormal uterine bleeding (AUB) 12/09/2014  . Anxiety   . Arthritis   . Depression   . DJD (degenerative joint disease)   . Dumping syndrome   . Dysmenorrhea 07/15/2014  . Fatigue 12/24/2012  . Fibromyalgia diagnosed April 2016  . Headache   . Hx of migraine headaches 12/24/2012  . Inappropriate sinus tachycardia   . Irregular menstrual bleeding 07/15/2014  . Pelvic pain in female 03/03/2016  . Pneumothorax, spontaneous, tension   . Post concussion syndrome   . POTS (postural orthostatic tachycardia syndrome)   . PTSD (post-traumatic stress disorder)   . Scoliosis   . Thyroid disease   . Tick bite 03/03/2016  . Vitamin B 12 deficiency     Past Surgical History:  Procedure Laterality Date  . bone spurs toes Right   . CESAREAN SECTION    . COLONOSCOPY    . endooscopy    .  KNEE SURGERY    . rotate cuff lt arm      Family Psychiatric History: see below  Family History:  Family History  Problem Relation Age of Onset  . Hypertension Father   . Atrial fibrillation Father   . Alcohol abuse Father   . Cancer Maternal Grandmother        skin   . Heart disease Maternal Grandmother   . Other Maternal Grandmother        had thyroid removed  . Breast cancer Maternal Grandmother   . Heart disease Paternal Grandfather   . COPD Paternal Grandfather   . Hypertension Paternal Grandfather   . Diabetes Paternal Grandfather   . Stroke Paternal Grandfather   . Cancer Maternal Grandfather         bladder,lung  . Anxiety disorder Maternal Aunt   . Anxiety disorder Maternal Uncle   . Bipolar disorder Maternal Uncle   . Breast cancer Maternal Uncle        CML  . Leukemia Other   . Colon cancer Other        lung-2 maternal great uncles    Social History:  Social History   Socioeconomic History  . Marital status: Legally Separated    Spouse name: Not on file  . Number of children: 3  . Years of education: Not on file  . Highest education level: Not on file  Occupational History  . Not on file  Tobacco Use  . Smoking status: Current Every Day Smoker    Packs/day: 1.00    Years: 15.00    Pack years: 15.00    Types: Cigarettes    Start date: 03/13/1997  . Smokeless tobacco: Never Used  . Tobacco comment: "working on it" Was smoking 2.5 packs a day  Substance and Sexual Activity  . Alcohol use: No  . Drug use: No  . Sexual activity: Yes    Partners: Male    Birth control/protection: None  Other Topics Concern  . Not on file  Social History Narrative  . Not on file   Social Determinants of Health   Financial Resource Strain:   . Difficulty of Paying Living Expenses: Not on file  Food Insecurity:   . Worried About Charity fundraiser in the Last Year: Not on file  . Ran Out of Food in the Last Year: Not on file  Transportation Needs:   . Lack of Transportation (Medical): Not on file  . Lack of Transportation (Non-Medical): Not on file  Physical Activity:   . Days of Exercise per Week: Not on file  . Minutes of Exercise per Session: Not on file  Stress:   . Feeling of Stress : Not on file  Social Connections:   . Frequency of Communication with Friends and Family: Not on file  . Frequency of Social Gatherings with Friends and Family: Not on file  . Attends Religious Services: Not on file  . Active Member of Clubs or Organizations: Not on file  . Attends Archivist Meetings: Not on file  . Marital Status: Not on file    Allergies:  Allergies   Allergen Reactions  . Prozac [Fluoxetine]     Suicidal thoughts  . Topiramate Other (See Comments)    Topamax-dizziness  . Lexapro [Escitalopram Oxalate] Other (See Comments)    Flat affect, No emotions    Metabolic Disorder Labs: Lab Results  Component Value Date   HGBA1C 5.4 01/07/2013   MPG 108 01/07/2013  Lab Results  Component Value Date   PROLACTIN 13.2 06/12/2016   Lab Results  Component Value Date   CHOL 171 10/08/2018   TRIG 200 (H) 10/08/2018   HDL 51 10/08/2018   CHOLHDL 3.4 10/08/2018   VLDL 31 01/07/2013   LDLCALC 80 10/08/2018   LDLCALC 90 01/07/2013   Lab Results  Component Value Date   TSH 1.971 10/24/2019   TSH 1.480 03/05/2019    Therapeutic Level Labs: Lab Results  Component Value Date   LITHIUM 0.24 (L) 09/03/2019   No results found for: VALPROATE No components found for:  CBMZ  Current Medications: Current Outpatient Medications  Medication Sig Dispense Refill  . ALPRAZolam (XANAX) 1 MG tablet Take 1 tablet (1 mg total) by mouth 4 (four) times daily. 120 tablet 2  . baclofen (LIORESAL) 10 MG tablet Take 0.5-1 tablets (5-10 mg total) by mouth 3 (three) times daily as needed for muscle spasms. 30 each 1  . Cholecalciferol (VITAMIN D3) 250 MCG (10000 UT) capsule Take 10,000 Units by mouth daily.    . Cyanocobalamin (B-12 PO) Take 1 tablet by mouth daily.    . cyclobenzaprine (FLEXERIL) 5 MG tablet Take 1 tablet (5 mg total) by mouth 3 (three) times daily as needed for muscle spasms. 90 tablet 3  . desogestrel-ethinyl estradiol (MIRCETTE) 0.15-0.02/0.01 MG (21/5) tablet TAKE (1) TABLET BY MOUTH ONCE DAILY. 28 tablet 12  . gabapentin (NEURONTIN) 300 MG capsule 1 PO q HS, may increase to 1 PO TID if tolerated 90 capsule 5  . ibuprofen (ADVIL,MOTRIN) 200 MG tablet Take 400-600 mg by mouth every 6 (six) hours as needed for mild pain or moderate pain.    Marland Kitchen levothyroxine (SYNTHROID) 50 MCG tablet TAKE 1 TABLET BY MOUTH ONCE A DAY. 30 tablet 3  .  lithium 300 MG tablet Take 2 tablets (600 mg total) by mouth at bedtime. 30 tablet 2  . nabumetone (RELAFEN) 750 MG tablet Take 1 tablet (750 mg total) by mouth 2 (two) times daily as needed. 60 tablet 6  . PARoxetine (PAXIL) 20 MG tablet Take 1 tablet (20 mg total) by mouth daily. 30 tablet 2   No current facility-administered medications for this visit.     Musculoskeletal: Strength & Muscle Tone: within normal limits Gait & Station: normal Patient leans: N/A  Psychiatric Specialty Exam: Review of Systems  Psychiatric/Behavioral: Positive for dysphoric mood. The patient is nervous/anxious.   All other systems reviewed and are negative.   There were no vitals taken for this visit.There is no height or weight on file to calculate BMI.  General Appearance: Casual and Fairly Groomed  Eye Contact:  Good  Speech:  Clear and Coherent  Volume:  Normal  Mood:  Dysphoric  Affect:  Appropriate and Congruent  Thought Process:  Goal Directed  Orientation:  Full (Time, Place, and Person)  Thought Content: Rumination   Suicidal Thoughts:  No  Homicidal Thoughts:  No  Memory:  Immediate;   Good Recent;   Good Remote;   Fair  Judgement:  Fair  Insight:  Fair  Psychomotor Activity:  Decreased  Concentration:  Concentration: Good and Attention Span: Good  Recall:  Good  Fund of Knowledge: Good  Language: Good  Akathisia:  No  Handed:  Right  AIMS (if indicated): not done  Assets:  Communication Skills Desire for Improvement Resilience Social Support Talents/Skills  ADL's:  Intact  Cognition: WNL  Sleep:  Fair   Screenings: Mini-Mental     Office  Visit from 10/25/[facility]  Total Score (max 30 points )  29    PHQ2-9     Office Visit from 10/08/2018 in [place] Visit from 08/21/2017 in [place] Office Visit from 07/18/2017 in [facility] Counselor from 07/10/2017 in [place] Office Visit from 07/03/2017 in [facility]  PHQ-2 Total Score  6  4  0  5  0  PHQ-9 Total Score  21  21  --  22  --       Assessment and Plan: This patient is a 39 year old female with long-term dysthymia depression and numerous somatic complaints.  She has been on numerous antidepressants up to now.  She thinks lithium has helped her mood and is convinced now that it did not cause headache.  We will therefore cautiously push up the dosage to 600 mg daily and recheck a blood level in 2 weeks.  Her thyroid panel last month was within normal limits.  She will continue Paxil 20 mg at bedtime for depression and Xanax 1 mg 4 times daily for anxiety.  She will continue her counseling and return to see me in 4 weeks   [name], MD 11/25/2019, 11:14 AM

## 2019-12-11 ENCOUNTER — Other Ambulatory Visit: Payer: Self-pay

## 2019-12-11 ENCOUNTER — Ambulatory Visit (INDEPENDENT_AMBULATORY_CARE_PROVIDER_SITE_OTHER): Payer: Medicare Other | Admitting: Clinical

## 2019-12-11 DIAGNOSIS — F4310 Post-traumatic stress disorder, unspecified: Secondary | ICD-10-CM | POA: Diagnosis not present

## 2019-12-11 DIAGNOSIS — F332 Major depressive disorder, recurrent severe without psychotic features: Secondary | ICD-10-CM

## 2019-12-11 NOTE — Progress Notes (Signed)
Virtual Visit via Video Note  I connected with Destiny Orr on 12/11/19 at  1:00 PM EST by a video enabled telemedicine application and verified that I am speaking with the correct person using two identifiers.  Location: Patient: Home  Provider: Office    I discussed the limitations of evaluation and management by telemedicine and the availability of in person appointments. The patient expressed understanding and agreed to proceed.       THERAPIST PROGRESS NOTE  Session Time: 1:00PM-1:45PM  Participation Level: Active  Behavioral Response: CasualAlertDepressed  Type of Therapy: Individual Therapy  Treatment Goals addressed: Coping  Interventions: CBT  Summary: Destiny Orr is a 39 y.o. female who presents with Depression and PTSD.The OPT therapist worked with the patient for her ongoing OPT treatment. The OPT therapist utilized Motivational Interviewing to assist in creating therapeutic repore. The patient in the session was engaged and work in collaboration giving feedback about her triggers and symptoms over the past few weeks. The OPT therapist utilized Cognitive Behavioral Therapy through cognitive restructuring as well as continued to try to empower the patient an work with the patient on coping strategies to assist in management of Depression.   Suicidal/Homicidal: Nowithout intent/plan  Therapist Response: The OPT therapist worked with the patient for the patients scheduled session. The patient was engaged in her session and gave feedback in relation to triggers, symptoms, and behavior responses over the past few weeks including the impact of some current physical problems on her mental health. The OPT therapist worked with the patient utilizing an in session Cognitive Behavioral Therapy exercise. The patient was responsive in the session and verbalized, " I am willing to work on having a healthier sleep and eating pattern". The patient indicated having an improved mood over the  past week and getting outside more often and being more active. The OPT therapist will continue treatment work with the patient in her next scheduled session.  Plan: Return again in 3 weeks.  Diagnosis: Axis I:  Major depressive disorder, recurrent, severe without psychotic features and PTSD (post-traumatic stress disorder)    Axis II: No diagnosis  I discussed the assessment and treatment plan with the patient. The patient was provided an opportunity to ask questions and all were answered. The patient agreed with the plan and demonstrated an understanding of the instructions.   The patient was advised to call back or seek an in-person evaluation if the symptoms worsen or if the condition fails to improve as anticipated.  I provided 45 minutes of non-face-to-face time during this encounter.  Lennox Grumbles, LCSW 12/11/2019

## 2019-12-22 ENCOUNTER — Ambulatory Visit (INDEPENDENT_AMBULATORY_CARE_PROVIDER_SITE_OTHER): Payer: Medicare Other | Admitting: Psychiatry

## 2019-12-22 ENCOUNTER — Other Ambulatory Visit: Payer: Self-pay

## 2019-12-22 ENCOUNTER — Encounter (HOSPITAL_COMMUNITY): Payer: Self-pay | Admitting: Psychiatry

## 2019-12-22 DIAGNOSIS — F4310 Post-traumatic stress disorder, unspecified: Secondary | ICD-10-CM

## 2019-12-22 DIAGNOSIS — F332 Major depressive disorder, recurrent severe without psychotic features: Secondary | ICD-10-CM

## 2019-12-22 MED ORDER — ALPRAZOLAM 1 MG PO TABS: 1.0000 mg | ORAL_TABLET | Freq: Four times a day (QID) | ORAL | 2 refills | Status: DC

## 2019-12-22 MED ORDER — PAROXETINE HCL 20 MG PO TABS: 20.0000 mg | ORAL_TABLET | Freq: Every day | ORAL | 2 refills | Status: DC

## 2019-12-22 MED ORDER — LITHIUM CARBONATE 300 MG PO TABS: 600.0000 mg | ORAL_TABLET | Freq: Every day | ORAL | 2 refills | Status: DC

## 2019-12-22 NOTE — Progress Notes (Signed)
Virtual Visit via Video Note  I connected with Destiny Orr on 12/22/19 at 11:00 AM EDT by a video enabled telemedicine application and verified that I am speaking with the correct person using two identifiers.   I discussed the limitations of evaluation and management by telemedicine and the availability of in person appointments. The patient expressed understanding and agreed to proceed.    I discussed the assessment and treatment plan with the patient. The patient was provided an opportunity to ask questions and all were answered. The patient agreed with the plan and demonstrated an understanding of the instructions.   The patient was advised to call back or seek an in-person evaluation if the symptoms worsen or if the condition fails to improve as anticipated.  I provided 15 minutes of non-face-to-face time during this encounter.   [name], MD  [facility] MD/PA/NP OP Progress Note  12/22/2019 11:14 AM Destiny Orr  MRN:  [id number]  Chief Complaint: Depression and anxiety HPI: This patient is a 39 year old separated white female who lives with her parents 2 daughters and 1 son in [place].  She is on disability from [employer] and also gets some Social Security disability.  The patient returns after 4 weeks.  She is back on lithium but states that she ran out early because she did not call in on time and was out of it for about 5 days.  During that time she got more angry and irritable.  She states she has been somewhat more depressed because March 15 marked the 19th anniversary of the abortion of her child.  This always upsets her.  Her sleep is variable but she is trying to become more active and get outside as much as she can.  She asked me about addition of Rexulti but she has had treatment failures with Abilify, olanzapine, Vraylar, and these are all in the same class.  However if she does not seem improved with taking lithium 600 mg consistently at a good blood level we can consider  adding this. Visit Diagnosis:    ICD-10-CM   1. Major depressive disorder, recurrent, severe without psychotic features ([place])  F33.2   2. PTSD (post-traumatic stress disorder)  F43.10     Past Psychiatric History: Long-term outpatient treatment for depression and anxiety  Past Medical History:  Past Medical History:  Diagnosis Date  . Abnormal Papanicolaou smear of cervix with positive human papilloma virus (HPV) test 10/11/2018   LSIL +HPV, get colpo___  . Abnormal uterine bleeding (AUB) 12/09/2014  . Anxiety   . Arthritis   . Depression   . DJD (degenerative joint disease)   . Dumping syndrome   . Dysmenorrhea 07/15/2014  . Fatigue 12/24/2012  . Fibromyalgia diagnosed April 2016  . Headache   . Hx of migraine headaches 12/24/2012  . Inappropriate sinus tachycardia   . Irregular menstrual bleeding 07/15/2014  . Pelvic pain in female 03/03/2016  . Pneumothorax, spontaneous, tension   . Post concussion syndrome   . POTS (postural orthostatic tachycardia syndrome)   . PTSD (post-traumatic stress disorder)   . Scoliosis   . Thyroid disease   . Tick bite 03/03/2016  . Vitamin B 12 deficiency     Past Surgical History:  Procedure Laterality Date  . bone spurs toes Right   . CESAREAN SECTION    . COLONOSCOPY    . endooscopy    . KNEE SURGERY    . rotate cuff lt arm      Family Psychiatric  History: See below  Family History:  Family History  Problem Relation Age of Onset  . Hypertension Father   . Atrial fibrillation Father   . Alcohol abuse Father   . Cancer Maternal Grandmother        skin   . Heart disease Maternal Grandmother   . Other Maternal Grandmother        had thyroid removed  . Breast cancer Maternal Grandmother   . Heart disease Paternal Grandfather   . COPD Paternal Grandfather   . Hypertension Paternal Grandfather   . Diabetes Paternal Grandfather   . Stroke Paternal Grandfather   . Cancer Maternal Grandfather        bladder,lung  . Anxiety disorder  Maternal Aunt   . Anxiety disorder Maternal Uncle   . Bipolar disorder Maternal Uncle   . Breast cancer Maternal Uncle        CML  . Leukemia Other   . Colon cancer Other        lung-2 maternal great uncles    Social History:  Social History   Socioeconomic History  . Marital status: Legally Separated    Spouse name: Not on file  . Number of children: 3  . Years of education: Not on file  . Highest education level: Not on file  Occupational History  . Not on file  Tobacco Use  . Smoking status: Current Every Day Smoker    Packs/day: 1.00    Years: 15.00    Pack years: 15.00    Types: Cigarettes    Start date: 03/13/1997  . Smokeless tobacco: Never Used  . Tobacco comment: "working on it" Was smoking 2.5 packs a day  Substance and Sexual Activity  . Alcohol use: No  . Drug use: No  . Sexual activity: Yes    Partners: Male    Birth control/protection: None  Other Topics Concern  . Not on file  Social History Narrative  . Not on file   Social Determinants of Health   Financial Resource Strain:   . Difficulty of Paying Living Expenses:   Food Insecurity:   . Worried About Charity fundraiser in the Last Year:   . Arboriculturist in the Last Year:   Transportation Needs:   . Film/video editor (Medical):   Marland Kitchen Lack of Transportation (Non-Medical):   Physical Activity:   . Days of Exercise per Week:   . Minutes of Exercise per Session:   Stress:   . Feeling of Stress :   Social Connections:   . Frequency of Communication with Friends and Family:   . Frequency of Social Gatherings with Friends and Family:   . Attends Religious Services:   . Active Member of Clubs or Organizations:   . Attends Archivist Meetings:   Marland Kitchen Marital Status:     Allergies:  Allergies  Allergen Reactions  . Prozac [Fluoxetine]     Suicidal thoughts  . Topiramate Other (See Comments)    Topamax-dizziness  . Lexapro [Escitalopram Oxalate] Other (See Comments)    Flat  affect, No emotions    Metabolic Disorder Labs: Lab Results  Component Value Date   HGBA1C 5.4 01/07/2013   MPG 108 01/07/2013   Lab Results  Component Value Date   PROLACTIN 13.2 06/12/2016   Lab Results  Component Value Date   CHOL 171 10/08/2018   TRIG 200 (H) 10/08/2018   HDL 51 10/08/2018   CHOLHDL 3.4 10/08/2018   VLDL 31  01/07/2013   [place] 80 10/08/2018   LDLCALC 90 01/07/2013   Lab Results  Component Value Date   TSH 1.971 10/24/2019   TSH 1.480 03/05/2019    Therapeutic Level Labs: Lab Results  Component Value Date   LITHIUM 0.24 (L) 09/03/2019   No results found for: VALPROATE No components found for:  CBMZ  Current Medications: Current Outpatient Medications  Medication Sig Dispense Refill  . ALPRAZolam (XANAX) 1 MG tablet Take 1 tablet (1 mg total) by mouth 4 (four) times daily. 120 tablet 2  . baclofen (LIORESAL) 10 MG tablet Take 0.5-1 tablets (5-10 mg total) by mouth 3 (three) times daily as needed for muscle spasms. 30 each 1  . Cholecalciferol (VITAMIN D3) 250 MCG (10000 UT) capsule Take 10,000 Units by mouth daily.    . Cyanocobalamin (B-12 PO) Take 1 tablet by mouth daily.    . cyclobenzaprine (FLEXERIL) 5 MG tablet Take 1 tablet (5 mg total) by mouth 3 (three) times daily as needed for muscle spasms. 90 tablet 3  . desogestrel-ethinyl estradiol (MIRCETTE) 0.15-0.02/0.01 MG (21/5) tablet TAKE (1) TABLET BY MOUTH ONCE DAILY. 28 tablet 12  . gabapentin (NEURONTIN) 300 MG capsule 1 PO q HS, may increase to 1 PO TID if tolerated 90 capsule 5  . ibuprofen (ADVIL,MOTRIN) 200 MG tablet Take 400-600 mg by mouth every 6 (six) hours as needed for mild pain or moderate pain.    Marland Kitchen levothyroxine (SYNTHROID) 50 MCG tablet TAKE 1 TABLET BY MOUTH ONCE A DAY. 30 tablet 3  . lithium 300 MG tablet Take 2 tablets (600 mg total) by mouth at bedtime. 60 tablet 2  . nabumetone (RELAFEN) 750 MG tablet Take 1 tablet (750 mg total) by mouth 2 (two) times daily as needed.  60 tablet 6  . PARoxetine (PAXIL) 20 MG tablet Take 1 tablet (20 mg total) by mouth daily. 30 tablet 2   No current facility-administered medications for this visit.     Musculoskeletal: Strength & Muscle Tone: within normal limits Gait & Station: normal Patient leans: N/A  Psychiatric Specialty Exam: Review of Systems  Psychiatric/Behavioral: Positive for dysphoric mood and sleep disturbance.  All other systems reviewed and are negative.   There were no vitals taken for this visit.There is no height or weight on file to calculate BMI.  General Appearance: Casual and Fairly Groomed  Eye Contact:  Good  Speech:  Clear and Coherent  Volume:  Normal  Mood:  Anxious  Affect:  Appropriate and Congruent  Thought Process:  Goal Directed  Orientation:  Full (Time, Place, and Person)  Thought Content: Rumination   Suicidal Thoughts:  No  Homicidal Thoughts:  No  Memory:  Immediate;   Good Recent;   Good Remote;   Good  Judgement:  Good  Insight:  Fair  Psychomotor Activity:  Decreased  Concentration:  Concentration: Good and Attention Span: Good  Recall:  Good  Fund of Knowledge: Good  Language: Good  Akathisia:  No  Handed:  Right  AIMS (if indicated): not done  Assets:  Communication Skills Desire for Improvement Resilience Social Support Talents/Skills  ADL's:  Intact  Cognition: WNL  Sleep:  Fair   Screenings: Mini-Mental     Office Visit from 10/25/2016 in [facility]  Total Score (max 30 points )  29    PHQ2-9     Office Visit from 10/08/2018 in [place] Office Visit from 08/21/2017 in [place] Office Visit from 07/18/2017 in [facility] Counselor from 07/10/2017 in [place] Office Visit from 07/03/2017 in [facility]  PHQ-2 Total Score  6  4  0  5  0  PHQ-9 Total Score  21  21  --  22  --       Assessment and Plan: This patient is  a 39 year old female with long-term dysthymia, depression and numerous somatic complaints.  She thinks lithium has helped her mood and found out by missing a few days that she is much more irritable without it.  She is now on 600 mg daily and we will recheck a level next week.  She will continue Paxil 20 mg at bedtime for depression and Xanax 1 mg 4 times daily for anxiety.  If she has only mild to moderate improvement with lithium we can consider addition of Rexulti.  She will continue her counseling and return to see me in 5 weeks   [name], MD 12/22/2019, 11:14 AM

## 2020-01-07 ENCOUNTER — Ambulatory Visit (INDEPENDENT_AMBULATORY_CARE_PROVIDER_SITE_OTHER): Payer: Medicare Other

## 2020-01-07 ENCOUNTER — Other Ambulatory Visit: Payer: Self-pay

## 2020-01-07 ENCOUNTER — Encounter: Payer: Self-pay | Admitting: Emergency Medicine

## 2020-01-07 ENCOUNTER — Ambulatory Visit (INDEPENDENT_AMBULATORY_CARE_PROVIDER_SITE_OTHER): Payer: Medicare Other | Admitting: Clinical

## 2020-01-07 ENCOUNTER — Ambulatory Visit
Admission: EM | Admit: 2020-01-07 | Discharge: 2020-01-07 | Disposition: A | Discharge status: Home / Self Care | Payer: Medicare Other | Attending: Emergency Medicine | Admitting: Emergency Medicine

## 2020-01-07 DIAGNOSIS — M79672 Pain in left foot: Secondary | ICD-10-CM

## 2020-01-07 DIAGNOSIS — F4310 Post-traumatic stress disorder, unspecified: Secondary | ICD-10-CM | POA: Diagnosis not present

## 2020-01-07 DIAGNOSIS — S93602A Unspecified sprain of left foot, initial encounter: Secondary | ICD-10-CM

## 2020-01-07 DIAGNOSIS — F332 Major depressive disorder, recurrent severe without psychotic features: Secondary | ICD-10-CM | POA: Diagnosis not present

## 2020-01-07 MED ORDER — MELOXICAM 7.5 MG PO TABS: 7.5000 mg | ORAL_TABLET | Freq: Every day | ORAL | 0 refills | Status: DC

## 2020-01-07 NOTE — ED Provider Notes (Signed)
[facility]   [id number] 01/07/20 Arrival Time: [id number]  CC: Foot pain  SUBJECTIVE: History from: patient. Destiny Orr is a 39 y.o. female complains of left foot pain x 1 week.  Patient misstep while walking in the park and rolled left foot.  Localizes the pain to the center of foot.  Describes the pain as constant and burning in character.  Has tried OTC medications without relief.  Symptoms are made worse with weight-bearing.  Admits to swelling and bruising now resolved  Denies fever, chills, erythema.   ROS: As per HPI.  All other pertinent ROS negative.     Past Medical History:  Diagnosis Date  . Abnormal Papanicolaou smear of cervix with positive human papilloma virus (HPV) test 10/11/2018   LSIL +HPV, get colpo___  . Abnormal uterine bleeding (AUB) 12/09/2014  . Anxiety   . Arthritis   . Depression   . DJD (degenerative joint disease)   . Dumping syndrome   . Dysmenorrhea 07/15/2014  . Fatigue 12/24/2012  . Fibromyalgia diagnosed April 2016  . Headache   . Hx of migraine headaches 12/24/2012  . Inappropriate sinus tachycardia   . Irregular menstrual bleeding 07/15/2014  . Pelvic pain in female 03/03/2016  . Pneumothorax, spontaneous, tension   . Post concussion syndrome   . POTS (postural orthostatic tachycardia syndrome)   . PTSD (post-traumatic stress disorder)   . Scoliosis   . Thyroid disease   . Tick bite 03/03/2016  . Vitamin B 12 deficiency    Past Surgical History:  Procedure Laterality Date  . bone spurs toes Right   . CESAREAN SECTION    . COLONOSCOPY    . endooscopy    . KNEE SURGERY    . rotate cuff lt arm     Allergies  Allergen Reactions  . Prozac [Fluoxetine]     Suicidal thoughts  . Topiramate Other (See Comments)    Topamax-dizziness  . Lexapro [Escitalopram Oxalate] Other (See Comments)    Flat affect, No emotions   No current facility-administered medications on file prior to encounter.   Current Outpatient Medications on File Prior to  Encounter  Medication Sig Dispense Refill  . ALPRAZolam (XANAX) 1 MG tablet Take 1 tablet (1 mg total) by mouth 4 (four) times daily. 120 tablet 2  . baclofen (LIORESAL) 10 MG tablet Take 0.5-1 tablets (5-10 mg total) by mouth 3 (three) times daily as needed for muscle spasms. 30 each 1  . Cholecalciferol (VITAMIN D3) 250 MCG (10000 UT) capsule Take 10,000 Units by mouth daily.    . Cyanocobalamin (B-12 PO) Take 1 tablet by mouth daily.    . cyclobenzaprine (FLEXERIL) 5 MG tablet Take 1 tablet (5 mg total) by mouth 3 (three) times daily as needed for muscle spasms. 90 tablet 3  . desogestrel-ethinyl estradiol (MIRCETTE) 0.15-0.02/0.01 MG (21/5) tablet TAKE (1) TABLET BY MOUTH ONCE DAILY. 28 tablet 12  . gabapentin (NEURONTIN) 300 MG capsule 1 PO q HS, may increase to 1 PO TID if tolerated 90 capsule 5  . ibuprofen (ADVIL,MOTRIN) 200 MG tablet Take 400-600 mg by mouth every 6 (six) hours as needed for mild pain or moderate pain.    Marland Kitchen levothyroxine (SYNTHROID) 50 MCG tablet TAKE 1 TABLET BY MOUTH ONCE A DAY. 30 tablet 3  . lithium 300 MG tablet Take 2 tablets (600 mg total) by mouth at bedtime. 60 tablet 2  . nabumetone (RELAFEN) 750 MG tablet Take 1 tablet (750 mg total) by mouth 2 (two)  times daily as needed. 60 tablet 6  . PARoxetine (PAXIL) 20 MG tablet Take 1 tablet (20 mg total) by mouth daily. 30 tablet 2   Social History   Socioeconomic History  . Marital status: Legally Separated    Spouse name: Not on file  . Number of children: 3  . Years of education: Not on file  . Highest education level: Not on file  Occupational History  . Not on file  Tobacco Use  . Smoking status: Current Every Day Smoker    Packs/day: 1.00    Years: 15.00    Pack years: 15.00    Types: Cigarettes    Start date: 03/13/1997  . Smokeless tobacco: Never Used  . Tobacco comment: "working on it" Was smoking 2.5 packs a day  Substance and Sexual Activity  . Alcohol use: No  . Drug use: No  . Sexual  activity: Yes    Partners: Male    Birth control/protection: None  Other Topics Concern  . Not on file  Social History Narrative  . Not on file   Social Determinants of Health   Financial Resource Strain:   . Difficulty of Paying Living Expenses:   Food Insecurity:   . Worried About Charity fundraiser in the Last Year:   . Arboriculturist in the Last Year:   Transportation Needs:   . Film/video editor (Medical):   Marland Kitchen Lack of Transportation (Non-Medical):   Physical Activity:   . Days of Exercise per Week:   . Minutes of Exercise per Session:   Stress:   . Feeling of Stress :   Social Connections:   . Frequency of Communication with Friends and Family:   . Frequency of Social Gatherings with Friends and Family:   . Attends Religious Services:   . Active Member of Clubs or Organizations:   . Attends Archivist Meetings:   Marland Kitchen Marital Status:   Intimate Partner Violence:   . Fear of Current or Ex-Partner:   . Emotionally Abused:   Marland Kitchen Physically Abused:   . Sexually Abused:    Family History  Problem Relation Age of Onset  . Hypertension Father   . Atrial fibrillation Father   . Alcohol abuse Father   . Cancer Maternal Grandmother        skin   . Heart disease Maternal Grandmother   . Other Maternal Grandmother        had thyroid removed  . Breast cancer Maternal Grandmother   . Heart disease Paternal Grandfather   . COPD Paternal Grandfather   . Hypertension Paternal Grandfather   . Diabetes Paternal Grandfather   . Stroke Paternal Grandfather   . Cancer Maternal Grandfather        bladder,lung  . Anxiety disorder Maternal Aunt   . Anxiety disorder Maternal Uncle   . Bipolar disorder Maternal Uncle   . Breast cancer Maternal Uncle        CML  . Leukemia Other   . Colon cancer Other        lung-2 maternal great uncles    OBJECTIVE:  Vitals:   01/07/20 1512  BP: 123/81  Pulse: 98  Resp: 18  Temp: 99.2 F (37.3 C)  TempSrc: Oral  SpO2:  96%    General appearance: ALERT; in no acute distress.  Head: NCAT Lungs: Normal respiratory effort CV: Dorsalis pedis pulses 2+ Musculoskeletal: LT foot  Inspection: Skin warm, dry, clear and intact without obvious erythema,  effusion, or ecchymosis.  Palpation: TTP over distal 2-4th MTs ROM: FROM active and passive Strength: 5/5 dorsiflexion, 5/5 plantar flexion Skin: warm and dry Neurologic: Ambulates without difficulty; Sensation intact about the upper/ lower extremities Psychological: alert and cooperative; normal mood and affect  DIAGNOSTIC STUDIES:  DG Foot Complete Left  Result Date: 01/07/2020 CLINICAL DATA:  Rolled left foot.  Pain. EXAM: LEFT FOOT - COMPLETE 3+ VIEW COMPARISON:  05/28/2019 FINDINGS: Degenerative changes in the 1st MTP joint with joint space narrowing and spurring. No acute bony abnormality. Specifically, no fracture, subluxation, or dislocation. Soft tissues are intact. IMPRESSION: No acute bony abnormality. Electronically Signed   By: Rolm Baptise M.D.   On: 01/07/2020 15:52     X-rays negative for bony abnormalities including fracture, or dislocation.  No soft tissue swelling.    I have reviewed the x-rays myself and the radiologist interpretation. I am in agreement with the radiologist interpretation.     ASSESSMENT & PLAN:  1. Left foot pain   2. Foot sprain, left, initial encounter     Meds ordered this encounter  Medications  . meloxicam (MOBIC) 7.5 MG tablet    Sig: Take 1 tablet (7.5 mg total) by mouth daily.    Dispense:  30 tablet    Refill:  0    Order Specific Question:   Supervising Provider    Answer:   Raylene Everts [id number]   X-rays negative for fracture or dislocation Continue conservative management of rest, ice, and elevate Post-op shoe given.  Wear as needed for comfort Take naproxen as needed for pain relief (may cause abdominal discomfort, ulcers, and GI bleeds avoid taking with other NSAIDs) Follow up with PCP or  orthopedist if symptoms persist Return or go to the ER if you have any new or worsening symptoms (fever, chills, chest pain, swelling, bruising, worsening symptoms despite treatment, etc...)    Reviewed expectations re: course of current medical issues. Questions answered. Outlined signs and symptoms indicating need for more acute intervention. Patient verbalized understanding. After Visit Summary given.    Lestine Box, PA-C 01/07/20 1600

## 2020-01-07 NOTE — Discharge Instructions (Signed)
X-rays negative for fracture or dislocation Continue conservative management of rest, ice, and elevate Post-op shoe given.  Wear as needed for comfort Take naproxen as needed for pain relief (may cause abdominal discomfort, ulcers, and GI bleeds avoid taking with other NSAIDs) Follow up with PCP or orthopedist if symptoms persist Return or go to the ER if you have any new or worsening symptoms (fever, chills, chest pain, swelling, bruising, worsening symptoms despite treatment, etc...)

## 2020-01-07 NOTE — ED Triage Notes (Signed)
Patient was walking in park and had a misstep, stepped on a partial curb and rolled left foot. Pain is in center of foot.  Pressure applied to underside of foot causes pain.  Patient is able to move toes.  Patient finds herself walking on outside edge of foot

## 2020-01-07 NOTE — Progress Notes (Signed)
Virtual Visit via Video Note  I connected with Destiny Orr on 01/07/20 at  1:00 PM EDT by a video enabled telemedicine application and verified that I am speaking with the correct person using two identifiers.  Location: Patient: Home Provider: Office   I discussed the limitations of evaluation and management by telemedicine and the availability of in person appointments. The patient expressed understanding and agreed to proceed.     THERAPIST PROGRESS NOTE  Session Time: 1:00PM-1:45PM  Participation Level: Active  Behavioral Response: CasualAlertDepressed  Type of Therapy: Individual Therapy  Treatment Goals addressed: Anger and Coping  Interventions: CBT, DBT, Solution Focused, Supportive and Anger Management Training  Summary: Destiny Orr is a 39 y.o. female who presents with Depression and PTSD.The OPT therapist worked with thepatientfor herongoing OPT treatment. The OPT therapist utilized Motivational Interviewing to assist in creating therapeutic repore. The patient in the session was engaged and work in Science writer about hertriggers and symptoms over the past few weeks including difficulty with her car, a upcoming trip to [place], and conflict in her interactions with her teen daughter. The OPT therapist utilized Cognitive Behavioral Therapy through cognitive restructuring as well as continued to try to empower the patient an work with the patient on coping strategies to assist in management ofDepression.   Suicidal/Homicidal: Nowithout intent/plan  Therapist Response: The OPT therapist worked with the patient for the patients scheduled session. The patient was engaged in hersession and gave feedback in relation to triggers, symptoms, and behavior responses over the past few weeks including the impact of some current physical problems on her mental health (potential foot fracture). The OPT therapist strongly suggested the patient see a physician to further  evaluate the extent of her injury and the patient agreed verbally to go to urgent care. The OPT therapist worked with the patient utilizing an in session Cognitive Behavioral Therapy exercise. The patient was responsive in the session and verbalized, " I am going to take some time and go on this trip to [place] and I am excited about going to visit [place]". The OPT therapist worked with the patient on not allowing un-empathetic statements from her teen daughter to be a trigger. The OPT therapist will continue treatment work with the patient in hernext scheduled session.  Plan: Return again in 3 weeks.  Diagnosis: Axis I: Major depressive disorder, recurrent, severe without psychotic featuresandPTSD (post-traumatic stress disorder)    Axis II: No diagnosis  I discussed the assessment and treatment plan with the patient. The patient was provided an opportunity to ask questions and all were answered. The patient agreed with the plan and demonstrated an understanding of the instructions.   The patient was advised to call back or seek an in-person evaluation if the symptoms worsen or if the condition fails to improve as anticipated.  I provided 45 minutes of non-face-to-face time during this encounter.  Lennox Grumbles, LCSW 01/07/2020

## 2020-01-20 ENCOUNTER — Encounter: Payer: Self-pay | Admitting: Family Medicine

## 2020-01-20 ENCOUNTER — Other Ambulatory Visit: Payer: Self-pay

## 2020-01-20 ENCOUNTER — Ambulatory Visit: Payer: Medicare Other | Admitting: Family Medicine

## 2020-01-20 DIAGNOSIS — M79672 Pain in left foot: Secondary | ICD-10-CM

## 2020-01-20 DIAGNOSIS — E559 Vitamin D deficiency, unspecified: Secondary | ICD-10-CM | POA: Diagnosis not present

## 2020-01-20 NOTE — Progress Notes (Signed)
Office Visit Note   Patient: Destiny Orr           Date of Birth: [birth year]           MRN: [id number] Visit Date: 01/20/2020 Requested by: Eunice Blase, MD [address] [place],  [place] [address] PCP: Eunice Blase, MD  Subjective: Chief Complaint  Patient presents with  . Left Foot - Pain    HPI: She is here with left foot pain.  Symptoms started about a month ago while walking for exercise.  She did not notice immediate pain, but afterward her foot was painful and swollen on the lateral and dorsal aspect.  It is continued to hurt, she has not been able to continue walking for exercise.  She went for x-rays 2 weeks ago which were negative for acute abnormality.  She has a history of vitamin D deficiency currently being treated.  She also continues to smoke cigarettes.               ROS:   All other systems were reviewed and are negative.  Objective: Vital Signs: LMP 12/21/2019   Physical Exam:  General:  Alert and oriented, in no acute distress. Pulm:  Breathing unlabored. Psy:  Normal mood, congruent affect.  Left foot: There is slight dorsal swelling and she is very tender to palpation midshaft third and fourth metatarsals from dorsal and plantar approach.  There is also tenderness at the proximal fifth metatarsal and along the peroneus brevis tendon.  She is able to evert her foot against resistance without much pain.  She can flex and extend her toes without much pain.  Imaging: No results found.  Assessment & Plan: 1.  Left foot pain, suspicious for occult stress fracture of metatarsals -We will treat with a short fracture boot, weightbearing as tolerated. -Encouraged her to try to quit smoking. -Return in 3 to 4 weeks if still having pain, and we will recheck three-view foot x-rays.  If x-rays are still unremarkable, then possibly MRI scan.      Procedures: No procedures performed  No notes on file     PMFS History: Patient Active Problem List   Diagnosis  Date Noted  . Hypothyroidism 11/15/2018  . Abnormal Papanicolaou smear of cervix with positive human papilloma virus (HPV) test 10/11/2018  . Goiter 10/08/2018  . Breast tenderness 10/08/2018  . Mass of upper outer quadrant of right breast 10/08/2018  . Encounter for gynecological examination with Papanicolaou smear of cervix 10/08/2018  . Current smoker 07/03/2017  . Leukocytosis 06/18/2017  . Thyroid nodule 12/26/2016  . Neck injury, initial encounter 10/20/2016  . Concussion with loss of consciousness 10/20/2016  . Injury of left shoulder 10/20/2016  . Sinus tachycardia 10/16/2016  . Post concussion syndrome 10/03/2016  . Intractable headache 10/03/2016  . Dizziness 10/03/2016  . Blurry vision, right eye 10/03/2016  . Nodular goiter 06/18/2016  . Fibromyalgia syndrome 06/16/2016  . Raynaud's syndrome without gangrene 06/16/2016  . Numbness and tingling sensation of skin 06/16/2016  . B12 deficiency 06/16/2016  . Dizziness and giddiness 06/16/2016  . Pelvic pain in female 03/03/2016  . Tick bite 03/03/2016  . POTS (postural orthostatic tachycardia syndrome) 02/05/2016  . Abnormal uterine bleeding (AUB) 12/09/2014  . Depression 11/26/2014  . Dysmenorrhea 07/15/2014  . Irregular menstrual bleeding 07/15/2014  . Back pain, chronic 04/02/2013  . Chronic fatigue and malaise 12/24/2012  . Anxiety 12/24/2012  . PTSD (post-traumatic stress disorder) 12/24/2012  . ADD (attention deficit disorder) 12/24/2012  .  Contraception 12/24/2012  . Hx of migraine headaches 12/24/2012   Past Medical History:  Diagnosis Date  . Abnormal Papanicolaou smear of cervix with positive human papilloma virus (HPV) test 10/11/2018   LSIL +HPV, get colpo___  . Abnormal uterine bleeding (AUB) 12/09/2014  . Anxiety   . Arthritis   . Depression   . DJD (degenerative joint disease)   . Dumping syndrome   . Dysmenorrhea 07/15/2014  . Fatigue 12/24/2012  . Fibromyalgia diagnosed April 2016  . Headache    . Hx of migraine headaches 12/24/2012  . Inappropriate sinus tachycardia   . Irregular menstrual bleeding 07/15/2014  . Pelvic pain in female 03/03/2016  . Pneumothorax, spontaneous, tension   . Post concussion syndrome   . POTS (postural orthostatic tachycardia syndrome)   . PTSD (post-traumatic stress disorder)   . Scoliosis   . Thyroid disease   . Tick bite 03/03/2016  . Vitamin B 12 deficiency     Family History  Problem Relation Age of Onset  . Hypertension Father   . Atrial fibrillation Father   . Alcohol abuse Father   . Cancer Maternal Grandmother        skin   . Heart disease Maternal Grandmother   . Other Maternal Grandmother        had thyroid removed  . Breast cancer Maternal Grandmother   . Heart disease Paternal Grandfather   . COPD Paternal Grandfather   . Hypertension Paternal Grandfather   . Diabetes Paternal Grandfather   . Stroke Paternal Grandfather   . Cancer Maternal Grandfather        bladder,lung  . Anxiety disorder Maternal Aunt   . Anxiety disorder Maternal Uncle   . Bipolar disorder Maternal Uncle   . Breast cancer Maternal Uncle        CML  . Leukemia Other   . Colon cancer Other        lung-2 maternal great uncles    Past Surgical History:  Procedure Laterality Date  . bone spurs toes Right   . CESAREAN SECTION    . COLONOSCOPY    . endooscopy    . KNEE SURGERY    . rotate cuff lt arm     Social History   Occupational History  . Not on file  Tobacco Use  . Smoking status: Current Every Day Smoker    Packs/day: 1.00    Years: 15.00    Pack years: 15.00    Types: Cigarettes    Start date: 03/13/1997  . Smokeless tobacco: Never Used  . Tobacco comment: "working on it" Was smoking 2.5 packs a day  Substance and Sexual Activity  . Alcohol use: No  . Drug use: No  . Sexual activity: Yes    Partners: Male    Birth control/protection: None

## 2020-01-20 NOTE — Progress Notes (Signed)
Walking one month ago. Didn't trip on anything or fall but started hurting her that night. Still having pain and swelling is worse.

## 2020-01-27 ENCOUNTER — Other Ambulatory Visit (HOSPITAL_COMMUNITY)
Admission: RE | Admit: 2020-01-27 | Discharge: 2020-01-27 | Disposition: A | Discharge status: Home / Self Care | Payer: Medicare Other | Source: Ambulatory Visit | Attending: Psychiatry | Admitting: Psychiatry

## 2020-01-27 ENCOUNTER — Other Ambulatory Visit: Payer: Self-pay

## 2020-01-27 DIAGNOSIS — F332 Major depressive disorder, recurrent severe without psychotic features: Secondary | ICD-10-CM | POA: Diagnosis present

## 2020-01-27 LAB — LITHIUM LEVEL: Lithium Lvl: 0.45 mmol/L — ABNORMAL LOW (ref 0.60–1.20)

## 2020-01-28 ENCOUNTER — Telehealth (INDEPENDENT_AMBULATORY_CARE_PROVIDER_SITE_OTHER): Payer: Medicare Other | Admitting: Psychiatry

## 2020-01-28 ENCOUNTER — Other Ambulatory Visit: Payer: Self-pay

## 2020-01-28 ENCOUNTER — Encounter (HOSPITAL_COMMUNITY): Payer: Self-pay | Admitting: Psychiatry

## 2020-01-28 DIAGNOSIS — F4310 Post-traumatic stress disorder, unspecified: Secondary | ICD-10-CM

## 2020-01-28 DIAGNOSIS — F332 Major depressive disorder, recurrent severe without psychotic features: Secondary | ICD-10-CM | POA: Diagnosis not present

## 2020-01-28 MED ORDER — ALPRAZOLAM 1 MG PO TABS: 1.0000 mg | ORAL_TABLET | Freq: Four times a day (QID) | ORAL | 2 refills | Status: DC

## 2020-01-28 MED ORDER — PAROXETINE HCL 20 MG PO TABS: 20.0000 mg | ORAL_TABLET | Freq: Every day | ORAL | 2 refills | Status: DC

## 2020-01-28 MED ORDER — BREXPIPRAZOLE 2 MG PO TABS: 2.0000 mg | ORAL_TABLET | Freq: Every day | ORAL | 2 refills | Status: DC

## 2020-01-28 MED ORDER — LITHIUM CARBONATE 300 MG PO TABS: 600.0000 mg | ORAL_TABLET | Freq: Every day | ORAL | 2 refills | Status: DC

## 2020-01-28 NOTE — Progress Notes (Signed)
Virtual Visit via Video Note  I connected with Destiny Orr on 01/28/20 at 11:20 AM EDT by a video enabled telemedicine application and verified that I am speaking with the correct person using two identifiers.   I discussed the limitations of evaluation and management by telemedicine and the availability of in person appointments. The patient expressed understanding and agreed to proceed.   I discussed the assessment and treatment plan with the patient. The patient was provided an opportunity to ask questions and all were answered. The patient agreed with the plan and demonstrated an understanding of the instructions.   The patient was advised to call back or seek an in-person evaluation if the symptoms worsen or if the condition fails to improve as anticipated.  I provided 15 minutes of non-face-to-face time during this encounter.   [name], MD  [facility] MD/PA/NP OP Progress Note  01/28/2020 11:37 AM Ridley Spira  MRN:  [id number]  Chief Complaint:  Chief Complaint    Depression; Anxiety; Follow-up     HPI: This patient is a 39 year old separated white female who lives with her parents 2 daughters and 1 son in [place].  She is on disability from [employer] and also gets Social Security disability.  The patient returns after 4 weeks.  She is now consistently taking lithium 600 mg at bedtime.  Her last level done this week was 0.45 which is closer to the normal range.  She states that she does not feel all that much better.  She recently developed stress fractures in her foot and now is in a boot so she is not able to do much exercising.  She states that her energy is quite low and she has little motivation.  She denies being suicidal she is disappointed that she cannot be more active.  She had asked in the past about trying Rexulti and I think this is a reasonable idea to see if we can boost up her energy. Visit Diagnosis:    ICD-10-CM   1. Major depressive disorder, recurrent, severe  without psychotic features ([place])  F33.2   2. PTSD (post-traumatic stress disorder)  F43.10     Past Psychiatric History: Long-term outpatient treatment for depression and anxiety  Past Medical History:  Past Medical History:  Diagnosis Date  . Abnormal Papanicolaou smear of cervix with positive human papilloma virus (HPV) test 10/11/2018   LSIL +HPV, get colpo___  . Abnormal uterine bleeding (AUB) 12/09/2014  . Anxiety   . Arthritis   . Depression   . DJD (degenerative joint disease)   . Dumping syndrome   . Dysmenorrhea 07/15/2014  . Fatigue 12/24/2012  . Fibromyalgia diagnosed April 2016  . Headache   . Hx of migraine headaches 12/24/2012  . Inappropriate sinus tachycardia   . Irregular menstrual bleeding 07/15/2014  . Pelvic pain in female 03/03/2016  . Pneumothorax, spontaneous, tension   . Post concussion syndrome   . POTS (postural orthostatic tachycardia syndrome)   . PTSD (post-traumatic stress disorder)   . Scoliosis   . Thyroid disease   . Tick bite 03/03/2016  . Vitamin B 12 deficiency     Past Surgical History:  Procedure Laterality Date  . bone spurs toes Right   . CESAREAN SECTION    . COLONOSCOPY    . endooscopy    . KNEE SURGERY    . rotate cuff lt arm      Family Psychiatric History: see below Family History:  Family History  Problem Relation Age  of Onset  . Hypertension Father   . Atrial fibrillation Father   . Alcohol abuse Father   . Cancer Maternal Grandmother        skin   . Heart disease Maternal Grandmother   . Other Maternal Grandmother        had thyroid removed  . Breast cancer Maternal Grandmother   . Heart disease Paternal Grandfather   . COPD Paternal Grandfather   . Hypertension Paternal Grandfather   . Diabetes Paternal Grandfather   . Stroke Paternal Grandfather   . Cancer Maternal Grandfather        bladder,lung  . Anxiety disorder Maternal Aunt   . Anxiety disorder Maternal Uncle   . Bipolar disorder Maternal Uncle   .  Breast cancer Maternal Uncle        CML  . Leukemia Other   . Colon cancer Other        lung-2 maternal great uncles    Social History:  Social History   Socioeconomic History  . Marital status: Legally Separated    Spouse name: Not on file  . Number of children: 3  . Years of education: Not on file  . Highest education level: Not on file  Occupational History  . Not on file  Tobacco Use  . Smoking status: Current Every Day Smoker    Packs/day: 1.00    Years: 15.00    Pack years: 15.00    Types: Cigarettes    Start date: 03/13/1997  . Smokeless tobacco: Never Used  . Tobacco comment: "working on it" Was smoking 2.5 packs a day  Substance and Sexual Activity  . Alcohol use: No  . Drug use: No  . Sexual activity: Yes    Partners: Male    Birth control/protection: None  Other Topics Concern  . Not on file  Social History Narrative  . Not on file   Social Determinants of Health   Financial Resource Strain:   . Difficulty of Paying Living Expenses:   Food Insecurity:   . Worried About Charity fundraiser in the Last Year:   . Arboriculturist in the Last Year:   Transportation Needs:   . Film/video editor (Medical):   Marland Kitchen Lack of Transportation (Non-Medical):   Physical Activity:   . Days of Exercise per Week:   . Minutes of Exercise per Session:   Stress:   . Feeling of Stress :   Social Connections:   . Frequency of Communication with Friends and Family:   . Frequency of Social Gatherings with Friends and Family:   . Attends Religious Services:   . Active Member of Clubs or Organizations:   . Attends Archivist Meetings:   Marland Kitchen Marital Status:     Allergies:  Allergies  Allergen Reactions  . Prozac [Fluoxetine]     Suicidal thoughts  . Topiramate Other (See Comments)    Topamax-dizziness  . Lexapro [Escitalopram Oxalate] Other (See Comments)    Flat affect, No emotions    Metabolic Disorder Labs: Lab Results  Component Value Date    HGBA1C 5.4 01/07/2013   MPG 108 01/07/2013   Lab Results  Component Value Date   PROLACTIN 13.2 06/12/2016   Lab Results  Component Value Date   CHOL 171 10/08/2018   TRIG 200 (H) 10/08/2018   HDL 51 10/08/2018   CHOLHDL 3.4 10/08/2018   VLDL 31 01/07/2013   LDLCALC 80 10/08/2018   LDLCALC 90 01/07/2013  Lab Results  Component Value Date   TSH 1.971 10/24/2019   TSH 1.480 03/05/2019    Therapeutic Level Labs: Lab Results  Component Value Date   LITHIUM 0.45 (L) 01/27/2020   LITHIUM 0.24 (L) 09/03/2019   No results found for: VALPROATE No components found for:  CBMZ  Current Medications: Current Outpatient Medications  Medication Sig Dispense Refill  . ALPRAZolam (XANAX) 1 MG tablet Take 1 tablet (1 mg total) by mouth 4 (four) times daily. 120 tablet 2  . baclofen (LIORESAL) 10 MG tablet Take 0.5-1 tablets (5-10 mg total) by mouth 3 (three) times daily as needed for muscle spasms. 30 each 1  . brexpiprazole (REXULTI) 2 MG TABS tablet Take 1 tablet (2 mg total) by mouth daily. 30 tablet 2  . Cholecalciferol (VITAMIN D3) 250 MCG (10000 UT) capsule Take 10,000 Units by mouth daily.    . Cyanocobalamin (B-12 PO) Take 1 tablet by mouth daily.    . cyclobenzaprine (FLEXERIL) 5 MG tablet Take 1 tablet (5 mg total) by mouth 3 (three) times daily as needed for muscle spasms. 90 tablet 3  . desogestrel-ethinyl estradiol (MIRCETTE) 0.15-0.02/0.01 MG (21/5) tablet TAKE (1) TABLET BY MOUTH ONCE DAILY. 28 tablet 12  . gabapentin (NEURONTIN) 300 MG capsule 1 PO q HS, may increase to 1 PO TID if tolerated 90 capsule 5  . ibuprofen (ADVIL,MOTRIN) 200 MG tablet Take 400-600 mg by mouth every 6 (six) hours as needed for mild pain or moderate pain.    Marland Kitchen levothyroxine (SYNTHROID) 50 MCG tablet TAKE 1 TABLET BY MOUTH ONCE A DAY. 30 tablet 3  . lithium 300 MG tablet Take 2 tablets (600 mg total) by mouth at bedtime. 60 tablet 2  . PARoxetine (PAXIL) 20 MG tablet Take 1 tablet (20 mg total) by  mouth daily. 30 tablet 2   No current facility-administered medications for this visit.     Musculoskeletal: Strength & Muscle Tone: within normal limits Gait & Station: normal Patient leans: N/A  Psychiatric Specialty Exam: Review of Systems  Constitutional: Positive for fatigue.  Endocrine: Positive for heat intolerance.  Musculoskeletal: Positive for arthralgias and joint swelling.  Psychiatric/Behavioral: Positive for dysphoric mood. The patient is nervous/anxious.   All other systems reviewed and are negative.   There were no vitals taken for this visit.There is no height or weight on file to calculate BMI.  General Appearance: Casual  Eye Contact:  Good  Speech:  Clear and Coherent  Volume:  Normal  Mood:  Dysphoric  Affect:  Constricted  Thought Process:  Goal Directed  Orientation:  Full (Time, Place, and Person)  Thought Content: Rumination   Suicidal Thoughts:  No  Homicidal Thoughts:  No  Memory:  Immediate;   Good Recent;   Good Remote;   Good  Judgement:  Good  Insight:  Fair  Psychomotor Activity:  Decreased  Concentration:  Concentration: Good and Attention Span: Good  Recall:  Good  Fund of Knowledge: Good  Language: Good  Akathisia:  No  Handed:  Right  AIMS (if indicated): not done  Assets:  Communication Skills Desire for Improvement Resilience Social Support Talents/Skills  ADL's:  Intact  Cognition: WNL  Sleep:  Fair   Screenings: Mini-Mental     Office Visit from 10/25/2016 in [facility]  Total Score (max 30 points )  29    PHQ2-9     Office Visit from 10/08/2018 in [facility] Office Visit from 08/21/2017 in [facility]  Visit from 07/18/2017 in [facility] Counselor from 07/10/2017 in [facility] Office Visit from 07/03/2017 in [facility]  PHQ-2 Total Score  6  4  0  5  0  PHQ-9 Total Score  21  21  --  22  --        Assessment and Plan: This patient is a 39 year old female with a long-term dysthymia, depression and numerous somatic complaints her lithium is in a fairly good level although it theoretically could go a bit higher I do not want her to develop side effects.  With her agreement we will add Rexulti beginning at 1 mg daily for 1 week and then advancing to 2 mg daily for augmentation.  She will continue Paxil 20 mg at bedtime for depression and Xanax 1 mg 4 times daily for anxiety.  She will return to see me in 4 weeks   [name], MD 01/28/2020, 11:37 AM

## 2020-01-30 ENCOUNTER — Ambulatory Visit (HOSPITAL_COMMUNITY): Payer: Medicare Other | Admitting: Psychiatry

## 2020-02-04 ENCOUNTER — Other Ambulatory Visit: Payer: Self-pay

## 2020-02-04 ENCOUNTER — Ambulatory Visit (INDEPENDENT_AMBULATORY_CARE_PROVIDER_SITE_OTHER): Payer: Medicare Other | Admitting: Clinical

## 2020-02-04 DIAGNOSIS — F4310 Post-traumatic stress disorder, unspecified: Secondary | ICD-10-CM

## 2020-02-04 DIAGNOSIS — F332 Major depressive disorder, recurrent severe without psychotic features: Secondary | ICD-10-CM

## 2020-02-04 NOTE — Progress Notes (Signed)
Virtual Visit via Video Note  I connected with Destiny Orr on 02/04/20 at 11:00 AM EDT by a video enabled telemedicine application and verified that I am speaking with the correct person using two identifiers.  Location: Patient: Home Provider: Office   I discussed the limitations of evaluation and management by telemedicine and the availability of in person appointments. The patient expressed understanding and agreed to proceed.        THERAPIST PROGRESS NOTE  Session Time: 11:00AM-11:40AM  Participation Level: Active  Behavioral Response: CasualAlertDepressed  Type of Therapy: Individual Therapy  Treatment Goals addressed: Coping  Interventions: CBT, Motivational Interviewing, Strength-based and Supportive  Summary: Destiny Orr is a 39 y.o. female who presents withDepression and PTSD.The OPT therapist worked with thepatientfor herongoing OPT treatment.The OPT therapist utilized Motivational Interviewing to assist in creating therapeutic repore. The patient in the session was engaged and work in Science writer about hertriggers and symptoms over the past few weeks including difficulty with mobility due to foot injury, lack of motivation, and difficulty with automatic negative thoughts. The OPT therapist utilized Cognitive Behavioral Therapy through cognitive restructuring as well ascontinued to try to empower the patient to challenge automatic negative thoughts anwork with the patient on coping strategies to assist in management ofDepression.    Suicidal/Homicidal: Nowithout intent/plan  Therapist Response: The OPT therapist worked with the patient for the patients scheduled session. The patient was engaged in hersession and gave feedback in relation to triggers, symptoms, and behavior responses over the pastfewweeks including the impact of a current physical problems on her mental health (potential foot fracture). .The OPT therapist worked with the patient  utilizing an in session Cognitive Behavioral Therapy exercise. The patient was responsive in the session and verbalized, " I am going to be consistent in taking my new medicine and its suppose to help with my energy".The OPT therapist worked with the patient on improving her thought processes and managing unproductive/negative thoughts.. The OPT therapist will continue treatment work with the patient in hernext scheduled session  Plan: Return again in 2/3 weeks.  Diagnosis: Axis I: Major depressive disorder, recurrent, severe without psychotic featuresandPTSD (post-traumatic stress disorder)    Axis II: No diagnosis   I discussed the assessment and treatment plan with the patient. The patient was provided an opportunity to ask questions and all were answered. The patient agreed with the plan and demonstrated an understanding of the instructions.   The patient was advised to call back or seek an in-person evaluation if the symptoms worsen or if the condition fails to improve as anticipated.  I provided 40 minutes of non-face-to-face time during this encounter.   Lennox Grumbles, LCSW 02/04/2020

## 2020-02-17 ENCOUNTER — Ambulatory Visit: Payer: Self-pay

## 2020-02-17 ENCOUNTER — Encounter: Payer: Self-pay | Admitting: Family Medicine

## 2020-02-17 ENCOUNTER — Other Ambulatory Visit: Payer: Self-pay

## 2020-02-17 ENCOUNTER — Ambulatory Visit: Payer: Medicare Other | Admitting: Family Medicine

## 2020-02-17 DIAGNOSIS — M2022 Hallux rigidus, left foot: Secondary | ICD-10-CM | POA: Diagnosis not present

## 2020-02-17 DIAGNOSIS — M25551 Pain in right hip: Secondary | ICD-10-CM

## 2020-02-17 DIAGNOSIS — M79672 Pain in left foot: Secondary | ICD-10-CM | POA: Diagnosis not present

## 2020-02-17 NOTE — Progress Notes (Signed)
Office Visit Note   Patient: Destiny Orr           Date of Birth: 1980-12-17           MRN: [id number] Visit Date: 02/17/2020 Requested by: Eunice Blase, MD [address] [place],  [place] [address] PCP: Eunice Blase, MD  Subjective: Chief Complaint  Patient presents with  . Left Foot - Pain    Pain is not as bad in the foot, but it is still there. Cannot walk with foot flat. In short cam boot.    HPI: She is here for follow-up left foot pain.  Is been about 2 months since the onset of her symptoms.  She has been in her fracture boot and overall her pain has improved somewhat, but she still cannot put full weight across her foot without the fracture boot.  She is frustrated that she cannot resume exercising to help lose weight.  She also asked about chronic right hip pain.  Anterolateral pain with occasional catching symptoms.  She had x-rays in 2017 showing mild spurring of the femoral head with preserved joint space, calcification near the lateral aspect of the joint which could indicate an old labrum tear.               ROS:   All other systems were reviewed and are negative.  Objective: Vital Signs: There were no vitals taken for this visit.  Physical Exam:  General:  Alert and oriented, in no acute distress. Pulm:  Breathing unlabored. Psy:  Normal mood, congruent affect.  Right hip: Good range of motion today with no significant pain on internal rotation.  She does have some tenderness over the greater trochanter. Left foot: She is tender just proximal to the third, fourth, and fifth MTP joints along the metatarsal bones.  No pain of the proximal fifth metatarsal today.  She still has tenderness at the first MTP with decreased range of motion.    Imaging: XR Foot Complete Left  Result Date: 02/17/2020 X-rays left foot: 3 views reveal no change from last month, no obvious stress fracture seen.  First MTP DJD is unchanged.   Assessment & Plan: 1.  Persistent but  slightly improved left foot pain, suspicious for third, fourth, and fifth metatarsal stress fractures -Continue with fracture boot for the next 3 to 4 weeks.  If she still having pain, then she will contact me and we will order MRI scan to further evaluate.  Depending on the results, could contemplate bone growth stimulator.  2.  Chronic right hip pain, possible acetabular labrum tear versus greater trochanter syndrome -She will do lateral leg raises for strengthening.  Could contemplate injection if symptoms worsen.     Procedures: No procedures performed  No notes on file     PMFS History: Patient Active Problem List   Diagnosis Date Noted  . Vitamin D deficiency 01/20/2020  . Hypothyroidism 11/15/2018  . Abnormal Papanicolaou smear of cervix with positive human papilloma virus (HPV) test 10/11/2018  . Goiter 10/08/2018  . Breast tenderness 10/08/2018  . Mass of upper outer quadrant of right breast 10/08/2018  . Encounter for gynecological examination with Papanicolaou smear of cervix 10/08/2018  . Current smoker 07/03/2017  . Leukocytosis 06/18/2017  . Thyroid nodule 12/26/2016  . Neck injury, initial encounter 10/20/2016  . Concussion with loss of consciousness 10/20/2016  . Injury of left shoulder 10/20/2016  . Sinus tachycardia 10/16/2016  . Post concussion syndrome 10/03/2016  . Intractable headache 10/03/2016  .  Dizziness 10/03/2016  . Blurry vision, right eye 10/03/2016  . Nodular goiter 06/18/2016  . Fibromyalgia syndrome 06/16/2016  . Raynaud's syndrome without gangrene 06/16/2016  . Numbness and tingling sensation of skin 06/16/2016  . B12 deficiency 06/16/2016  . Dizziness and giddiness 06/16/2016  . Pelvic pain in female 03/03/2016  . Tick bite 03/03/2016  . POTS (postural orthostatic tachycardia syndrome) 02/05/2016  . Abnormal uterine bleeding (AUB) 12/09/2014  . Depression 11/26/2014  . Dysmenorrhea 07/15/2014  . Irregular menstrual bleeding 07/15/2014   . Back pain, chronic 04/02/2013  . Chronic fatigue and malaise 12/24/2012  . Anxiety 12/24/2012  . PTSD (post-traumatic stress disorder) 12/24/2012  . ADD (attention deficit disorder) 12/24/2012  . Contraception 12/24/2012  . Hx of migraine headaches 12/24/2012   Past Medical History:  Diagnosis Date  . Abnormal Papanicolaou smear of cervix with positive human papilloma virus (HPV) test 10/11/2018   LSIL +HPV, get colpo___  . Abnormal uterine bleeding (AUB) 12/09/2014  . Anxiety   . Arthritis   . Depression   . DJD (degenerative joint disease)   . Dumping syndrome   . Dysmenorrhea 07/15/2014  . Fatigue 12/24/2012  . Fibromyalgia diagnosed April 2016  . Headache   . Hx of migraine headaches 12/24/2012  . Inappropriate sinus tachycardia   . Irregular menstrual bleeding 07/15/2014  . Pelvic pain in female 03/03/2016  . Pneumothorax, spontaneous, tension   . Post concussion syndrome   . POTS (postural orthostatic tachycardia syndrome)   . PTSD (post-traumatic stress disorder)   . Scoliosis   . Thyroid disease   . Tick bite 03/03/2016  . Vitamin B 12 deficiency     Family History  Problem Relation Age of Onset  . Hypertension Father   . Atrial fibrillation Father   . Alcohol abuse Father   . Cancer Maternal Grandmother        skin   . Heart disease Maternal Grandmother   . Other Maternal Grandmother        had thyroid removed  . Breast cancer Maternal Grandmother   . Heart disease Paternal Grandfather   . COPD Paternal Grandfather   . Hypertension Paternal Grandfather   . Diabetes Paternal Grandfather   . Stroke Paternal Grandfather   . Cancer Maternal Grandfather        bladder,lung  . Anxiety disorder Maternal Aunt   . Anxiety disorder Maternal Uncle   . Bipolar disorder Maternal Uncle   . Breast cancer Maternal Uncle        CML  . Leukemia Other   . Colon cancer Other        lung-2 maternal great uncles    Past Surgical History:  Procedure Laterality Date  .  bone spurs toes Right   . CESAREAN SECTION    . COLONOSCOPY    . endooscopy    . KNEE SURGERY    . rotate cuff lt arm     Social History   Occupational History  . Not on file  Tobacco Use  . Smoking status: Current Every Day Smoker    Packs/day: 1.00    Years: 15.00    Pack years: 15.00    Types: Cigarettes    Start date: 03/13/1997  . Smokeless tobacco: Never Used  . Tobacco comment: "working on it" Was smoking 2.5 packs a day  Substance and Sexual Activity  . Alcohol use: No  . Drug use: No  . Sexual activity: Yes    Partners: Male    Birth  control/protection: None

## 2020-02-25 ENCOUNTER — Ambulatory Visit (INDEPENDENT_AMBULATORY_CARE_PROVIDER_SITE_OTHER): Payer: Medicare Other | Admitting: Clinical

## 2020-02-25 ENCOUNTER — Other Ambulatory Visit: Payer: Self-pay

## 2020-02-25 DIAGNOSIS — F4310 Post-traumatic stress disorder, unspecified: Secondary | ICD-10-CM

## 2020-02-25 DIAGNOSIS — F332 Major depressive disorder, recurrent severe without psychotic features: Secondary | ICD-10-CM

## 2020-02-25 NOTE — Progress Notes (Signed)
   Virtual Visit via Video Note  I connected withAmy Orr on 02/25/20 at 11:00 AM EDT by a video enabled telemedicine application and verified that I am speaking with the correct person using two identifiers.  Location: Patient: Home Provider: Office  I discussed the limitations of evaluation and management by telemedicine and the availability of in person appointments. The patient expressed understanding and agreed to proceed.     THERAPIST PROGRESS NOTE  Session Time: 11:00AM-11:45AM  Participation Level: Active  Behavioral Response: CasualAlertDepressed  Type of Therapy: Individual Therapy  Treatment Goals addressed: Coping  Interventions: CBT, Motivational Interviewing, Strength-based and Supportive  Summary: Destiny Orr is a 39 y.o. female who presents withDepression and PTSD.The OPT therapist worked with thepatientfor herongoing OPT treatment.The OPT therapist utilized Motivational Interviewing to assist in creating therapeutic repore. The patient in the session was engaged and work in Science writer about hertriggers and symptoms over the past few weeksincluding ongoing difficulty with mobility due to foot injury, lack of motivation, difficulty with automatic negative thoughts, and some conflict with her Mother. The OPT therapist utilized Cognitive Behavioral Therapy through cognitive restructuring as well ascontinued to try to empower the patient to challenge automatic negative thoughts anwork with the patient on coping strategies to assist in management ofDepression. The OPT therapist provided psychoeducation during the session with the patient on Depression.   Suicidal/Homicidal: Nowithout intent/plan  Therapist Response: The OPT therapist worked with the patient for the patients scheduled session. The patient was engaged in hersession and gave feedback in relation to triggers, symptoms, and behavior responses over the  pastfewweeks including the impact of ongoing physical injury on her mental health(suspected foot fracture).The OPT therapist worked with the patient utilizing an in session Cognitive Behavioral Therapy exercise. The patient was responsive in the session and verbalized, " I amgoing to work with getting my Mother educated so she can understand my Depression as a health problem, I am going to try to be active for the [facility] Day weekend and cookout".The OPT therapist worked with the patient on improving her thought processes and managing unproductive automatic negative thoughts..The OPT therapist will continue treatment work with the patient in hernext scheduled session  Plan: Return again in 2/3 weeks.  Diagnosis:      Axis I: Major depressive disorder, recurrent, severe without psychotic featuresandPTSD (post-traumatic stress disorder)                          Axis II: No diagnosis   I discussed the assessment and treatment plan with the patient. The patient was provided an opportunity to ask questions and all were answered. The patient agreed with the plan and demonstrated an understanding of the instructions.  The patient was advised to call back or seek an in-person evaluation if the symptoms worsen or if the condition fails to improve as anticipated.  I provided 45 minutes of non-face-to-face time during this encounter.   Lennox Grumbles, LCSW 02/25/2020

## 2020-02-27 ENCOUNTER — Encounter (HOSPITAL_COMMUNITY): Payer: Self-pay | Admitting: Psychiatry

## 2020-02-27 ENCOUNTER — Telehealth (INDEPENDENT_AMBULATORY_CARE_PROVIDER_SITE_OTHER): Payer: Medicare Other | Admitting: Psychiatry

## 2020-02-27 ENCOUNTER — Other Ambulatory Visit: Payer: Self-pay

## 2020-02-27 DIAGNOSIS — F332 Major depressive disorder, recurrent severe without psychotic features: Secondary | ICD-10-CM | POA: Diagnosis not present

## 2020-02-27 DIAGNOSIS — F4310 Post-traumatic stress disorder, unspecified: Secondary | ICD-10-CM

## 2020-02-27 MED ORDER — PAROXETINE HCL 20 MG PO TABS: 20.0000 mg | ORAL_TABLET | Freq: Every day | ORAL | 2 refills | Status: DC

## 2020-02-27 MED ORDER — BREXPIPRAZOLE 2 MG PO TABS: 2.0000 mg | ORAL_TABLET | Freq: Every day | ORAL | 2 refills | Status: DC

## 2020-02-27 MED ORDER — LITHIUM CARBONATE 300 MG PO TABS: 600.0000 mg | ORAL_TABLET | Freq: Every day | ORAL | 2 refills | Status: DC

## 2020-02-27 MED ORDER — ALPRAZOLAM 1 MG PO TABS: 1.0000 mg | ORAL_TABLET | Freq: Four times a day (QID) | ORAL | 2 refills | Status: DC

## 2020-02-27 NOTE — Progress Notes (Signed)
Virtual Visit via Video Note  I connected with Destiny Orr on 02/27/20 at 10:00 AM EDT by a video enabled telemedicine application and verified that I am speaking with the correct person using two identifiers.   I discussed the limitations of evaluation and management by telemedicine and the availability of in person appointments. The patient expressed understanding and agreed to proceed.    I discussed the assessment and treatment plan with the patient. The patient was provided an opportunity to ask questions and all were answered. The patient agreed with the plan and demonstrated an understanding of the instructions.   The patient was advised to call back or seek an in-person evaluation if the symptoms worsen or if the condition fails to improve as anticipated.  I provided 15 minutes of non-face-to-face time during this encounter.  Location: provider home, patient home Destiny Spiller, MD  [facility] MD/PA/NP OP Progress Note  02/27/2020 10:24 AM Destiny Orr  MRN:  [id number]  Chief Complaint:  Chief Complaint    Anxiety; Depression; Follow-up     HPI: This patient is a 39 year old separated white female who lives with her parents 2 daughters and 1 son in [place].  She is on disability from [employer] and also gets Social Security disability.  The patient returns for follow-up after 4 weeks.  Last time she asked about adding Rexulti.  She has been on it about 3 weeks or so and has not seen much difference in her mood.  She states that she is excessively sleepy now all the time.  In the past she was unable to sleep but for the last couple of months she sleeps through the night and then wakes up in keeps falling asleep through the day particular if she sits down for a minute.  She also feels very drowsy when driving.  It sounds as if she may be developing sleep apnea and I urged her to talk to her family doctor about arranging a sleep study.  She has not had any new changes in medication and she  states the symptoms predate the Rexulti..  For the most part her mood has been fairly stable although she feels like she does not have much feeling and her affect is somewhat flat.  She is trying to get out and do a little bit more even though it makes her anxious.  She denies suicidal ideation.  Her last thyroid laboratories were normal. Visit Diagnosis:    ICD-10-CM   1. Major depressive disorder, recurrent, severe without psychotic features ([place])  F33.2   2. PTSD (post-traumatic stress disorder)  F43.10     Past Psychiatric History: Long-term outpatient treatment for depression and anxiety  Past Medical History:  Past Medical History:  Diagnosis Date  . Abnormal Papanicolaou smear of cervix with positive human papilloma virus (HPV) test 10/11/2018   LSIL +HPV, get colpo___  . Abnormal uterine bleeding (AUB) 12/09/2014  . Anxiety   . Arthritis   . Depression   . DJD (degenerative joint disease)   . Dumping syndrome   . Dysmenorrhea 07/15/2014  . Fatigue 12/24/2012  . Fibromyalgia diagnosed April 2016  . Headache   . Hx of migraine headaches 12/24/2012  . Inappropriate sinus tachycardia   . Irregular menstrual bleeding 07/15/2014  . Pelvic pain in female 03/03/2016  . Pneumothorax, spontaneous, tension   . Post concussion syndrome   . POTS (postural orthostatic tachycardia syndrome)   . PTSD (post-traumatic stress disorder)   . Scoliosis   .  Thyroid disease   . Tick bite 03/03/2016  . Vitamin B 12 deficiency     Past Surgical History:  Procedure Laterality Date  . bone spurs toes Right   . CESAREAN SECTION    . COLONOSCOPY    . endooscopy    . KNEE SURGERY    . rotate cuff lt arm      Family Psychiatric History: see below  Family History:  Family History  Problem Relation Age of Onset  . Hypertension Father   . Atrial fibrillation Father   . Alcohol abuse Father   . Cancer Maternal Grandmother        skin   . Heart disease Maternal Grandmother   . Other Maternal  Grandmother        had thyroid removed  . Breast cancer Maternal Grandmother   . Heart disease Paternal Grandfather   . COPD Paternal Grandfather   . Hypertension Paternal Grandfather   . Diabetes Paternal Grandfather   . Stroke Paternal Grandfather   . Cancer Maternal Grandfather        bladder,lung  . Anxiety disorder Maternal Aunt   . Anxiety disorder Maternal Uncle   . Bipolar disorder Maternal Uncle   . Breast cancer Maternal Uncle        CML  . Leukemia Other   . Colon cancer Other        lung-2 maternal great uncles    Social History:  Social History   Socioeconomic History  . Marital status: Legally Separated    Spouse name: Not on file  . Number of children: 3  . Years of education: Not on file  . Highest education level: Not on file  Occupational History  . Not on file  Tobacco Use  . Smoking status: Current Every Day Smoker    Packs/day: 1.00    Years: 15.00    Pack years: 15.00    Types: Cigarettes    Start date: 03/13/1997  . Smokeless tobacco: Never Used  . Tobacco comment: "working on it" Was smoking 2.5 packs a day  Substance and Sexual Activity  . Alcohol use: No  . Drug use: No  . Sexual activity: Yes    Partners: Male    Birth control/protection: None  Other Topics Concern  . Not on file  Social History Narrative  . Not on file   Social Determinants of Health   Financial Resource Strain:   . Difficulty of Paying Living Expenses:   Food Insecurity:   . Worried About Charity fundraiser in the Last Year:   . Arboriculturist in the Last Year:   Transportation Needs:   . Film/video editor (Medical):   Marland Kitchen Lack of Transportation (Non-Medical):   Physical Activity:   . Days of Exercise per Week:   . Minutes of Exercise per Session:   Stress:   . Feeling of Stress :   Social Connections:   . Frequency of Communication with Friends and Family:   . Frequency of Social Gatherings with Friends and Family:   . Attends Religious Services:    . Active Member of Clubs or Organizations:   . Attends Archivist Meetings:   Marland Kitchen Marital Status:     Allergies:  Allergies  Allergen Reactions  . Prozac [Fluoxetine]     Suicidal thoughts  . Topiramate Other (See Comments)    Topamax-dizziness  . Lexapro [Escitalopram Oxalate] Other (See Comments)    Flat affect, No emotions  Metabolic Disorder Labs: Lab Results  Component Value Date   HGBA1C 5.4 01/07/2013   MPG 108 01/07/2013   Lab Results  Component Value Date   PROLACTIN 13.2 06/12/2016   Lab Results  Component Value Date   CHOL 171 10/08/2018   TRIG 200 (H) 10/08/2018   HDL 51 10/08/2018   CHOLHDL 3.4 10/08/2018   VLDL 31 01/07/2013   LDLCALC 80 10/08/2018   LDLCALC 90 01/07/2013   Lab Results  Component Value Date   TSH 1.971 10/24/2019   TSH 1.480 03/05/2019    Therapeutic Level Labs: Lab Results  Component Value Date   LITHIUM 0.45 (L) 01/27/2020   LITHIUM 0.24 (L) 09/03/2019   No results found for: VALPROATE No components found for:  CBMZ  Current Medications: Current Outpatient Medications  Medication Sig Dispense Refill  . ALPRAZolam (XANAX) 1 MG tablet Take 1 tablet (1 mg total) by mouth 4 (four) times daily. 120 tablet 2  . brexpiprazole (REXULTI) 2 MG TABS tablet Take 1 tablet (2 mg total) by mouth daily. 30 tablet 2  . Cholecalciferol (VITAMIN D3) 250 MCG (10000 UT) capsule Take 10,000 Units by mouth daily.    . Cyanocobalamin (B-12 PO) Take 1 tablet by mouth daily.    Marland Kitchen desogestrel-ethinyl estradiol (MIRCETTE) 0.15-0.02/0.01 MG (21/5) tablet TAKE (1) TABLET BY MOUTH ONCE DAILY. 28 tablet 12  . ibuprofen (ADVIL,MOTRIN) 200 MG tablet Take 400-600 mg by mouth every 6 (six) hours as needed for mild pain or moderate pain.    Marland Kitchen levothyroxine (SYNTHROID) 50 MCG tablet TAKE 1 TABLET BY MOUTH ONCE A DAY. 30 tablet 3  . lithium 300 MG tablet Take 2 tablets (600 mg total) by mouth at bedtime. 60 tablet 2  . PARoxetine (PAXIL) 20 MG  tablet Take 1 tablet (20 mg total) by mouth daily. 30 tablet 2   No current facility-administered medications for this visit.     Musculoskeletal: Strength & Muscle Tone: within normal limits Gait & Station: normal Patient leans: N/A  Psychiatric Specialty Exam: Review of Systems  Constitutional: Positive for fatigue.  Musculoskeletal: Positive for arthralgias.  Psychiatric/Behavioral: The patient is nervous/anxious.   All other systems reviewed and are negative.   There were no vitals taken for this visit.There is no height or weight on file to calculate BMI.  General Appearance: Casual and Fairly Groomed  Eye Contact:  Good  Speech:  Clear and Coherent  Volume:  Normal  Mood:  Anxious  Affect:  Flat  Thought Process:  Goal Directed  Orientation:  Full (Time, Place, and Person)  Thought Content: Rumination   Suicidal Thoughts:  No  Homicidal Thoughts:  No  Memory:  Immediate;   Good Recent;   Good Remote;   Fair  Judgement:  Fair  Insight:  Fair  Psychomotor Activity:  Decreased  Concentration:  Concentration: Good and Attention Span: Good  Recall:  Good  Fund of Knowledge: Good  Language: Good  Akathisia:  No  Handed:  Right  AIMS (if indicated): not done  Assets:  Communication Skills Desire for Improvement Resilience Social Support Talents/Skills  ADL's:  Intact  Cognition: WNL  Sleep:  Good   Screenings: Mini-Mental     Office Visit from 10/25/2016 in [facility]  Total Score (max 30 points )  29    PHQ2-9     Office Visit from 10/08/2018 in [place] Visit from 08/21/2017 in [place] Office Visit from 07/18/2017 in [facility] Counselor  from 07/10/2017 in [place] Office Visit from 07/03/2017 in [facility]  PHQ-2 Total Score  6  4  0  5  0  PHQ-9 Total Score  21  21  --  22  --       Assessment and Plan: This  patient is a 39 year old female with a long-term dysthymia depression and numerous somatic complaints.  She is now on Rexulti for augmentation at 2 mg and does not know if it is helped much yet but is willing to give it a bit more time.  Since she is having a lot of daytime drowsiness I think she should have a sleep study and she will look into this.  She will continue Paxil 20 mg at bedtime for depression lithium 600 mg at bedtime for mood stabilization and Xanax 1 mg 4 times daily for anxiety.  She will return to see me in 4 weeks   Destiny Spiller, MD 02/27/2020, 10:24 AM

## 2020-03-01 NOTE — Progress Notes (Addendum)
Cardiology Office Note  Date: 03/02/2020   ID: Destiny Orr, DOB 12-15-1980, MRN [id number]  PCP:  Eunice Blase, MD  Cardiologist:  Carlyle Dolly, MD Electrophysiologist:  None   Chief Complaint: Follow-up POTS, palpitations  History of Present Illness: Destiny Orr is a 38 y.o. female with a history of POTS, palpitations, autonomic dysfunction and hyper adrenergic state.  Last saw Dr. Lovena Le August 20, 2017.  He had prescribed her pindolol and she was taking it as needed.  She stated it would occasionally drop her blood pressure too low.  She did not mention any frank syncopal episodes.  She was tolerating her pindolol reasonably well.  He discussed the importance of regular exercise and maintaining adequate hydration and sodium intake.  Discussed avoiding caffeine and alcohol.  She was currently a smoker at that time.  Patient states she has been doing relatively well with her POTS and autonomic dysfunction.  She states she still has dizzy spells when active, showering, drying hair or trying to do anything such as sweeping floors.  States this is worse when it is hot so she will avoid the heat.  She also describes "adrenaline dumps" which occur out of the blue without activity.  She states this happens randomly.  She states her heart rate still speeds up when she is active.  However lately she states her blood pressure instead of going down goes up when she is up and active.  At some point she has stopped her pindolol.  She has not had refills in quite some time per her statement.  She continues to smoke and knows she needs to quit.  She denies any CVA or TIA-like symptoms, feelings presyncope or syncope.  No bleeding in stool.  No PND or orthopnea.  Denies any claudication-like symptoms, DVT or PE-like symptoms, or lower extremity edema.  States it has been quite sometime since she followed up with Dr. Lovena Le EP.  She is on low-dose of levothyroxine for hypothyroidism.   Past Medical  History:  Diagnosis Date  . Abnormal Papanicolaou smear of cervix with positive human papilloma virus (HPV) test 10/11/2018   LSIL +HPV, get colpo___  . Abnormal uterine bleeding (AUB) 12/09/2014  . Anxiety   . Arthritis   . Depression   . DJD (degenerative joint disease)   . Dumping syndrome   . Dysmenorrhea 07/15/2014  . Fatigue 12/24/2012  . Fibromyalgia diagnosed April 2016  . Headache   . Hx of migraine headaches 12/24/2012  . Inappropriate sinus tachycardia   . Irregular menstrual bleeding 07/15/2014  . Pelvic pain in female 03/03/2016  . Pneumothorax, spontaneous, tension   . Post concussion syndrome   . POTS (postural orthostatic tachycardia syndrome)   . PTSD (post-traumatic stress disorder)   . Scoliosis   . Thyroid disease   . Tick bite 03/03/2016  . Vitamin B 12 deficiency     Past Surgical History:  Procedure Laterality Date  . bone spurs toes Right   . CESAREAN SECTION    . COLONOSCOPY    . endooscopy    . KNEE SURGERY    . rotate cuff lt arm      Current Outpatient Medications  Medication Sig Dispense Refill  . ALPRAZolam (XANAX) 1 MG tablet Take 1 tablet (1 mg total) by mouth 4 (four) times daily. 120 tablet 2  . brexpiprazole (REXULTI) 2 MG TABS tablet Take 1 tablet (2 mg total) by mouth daily. 30 tablet 2  . Cholecalciferol (VITAMIN D3) 250 MCG (  10000 UT) capsule Take 10,000 Units by mouth daily.    . Cyanocobalamin (B-12 PO) Take 1 tablet by mouth daily.    Marland Kitchen desogestrel-ethinyl estradiol (MIRCETTE) 0.15-0.02/0.01 MG (21/5) tablet TAKE (1) TABLET BY MOUTH ONCE DAILY. 28 tablet 12  . ibuprofen (ADVIL,MOTRIN) 200 MG tablet Take 400-600 mg by mouth every 6 (six) hours as needed for mild pain or moderate pain.    Marland Kitchen levothyroxine (SYNTHROID) 50 MCG tablet TAKE 1 TABLET BY MOUTH ONCE A DAY. 30 tablet 3  . lithium 300 MG tablet Take 2 tablets (600 mg total) by mouth at bedtime. 60 tablet 2  . PARoxetine (PAXIL) 20 MG tablet Take 1 tablet (20 mg total) by mouth  daily. 30 tablet 2  . metoprolol tartrate (LOPRESSOR) 25 MG tablet Take 0.5 tablets (12.5 mg total) by mouth 2 (two) times daily. 180 tablet 3   No current facility-administered medications for this visit.   Allergies:  Prozac [fluoxetine], Topiramate, and Lexapro [escitalopram oxalate]   Social History: The patient  reports that she has been smoking cigarettes. She started smoking about 22 years ago. She has a 15.00 pack-year smoking history. She has never used smokeless tobacco. She reports that she does not drink alcohol or use drugs.   Family History: The patient's family history includes Alcohol abuse in her father; Anxiety disorder in her maternal aunt and maternal uncle; Atrial fibrillation in her father; Bipolar disorder in her maternal uncle; Breast cancer in her maternal grandmother and maternal uncle; COPD in her paternal grandfather; Cancer in her maternal grandfather and maternal grandmother; Colon cancer in an other family member; Diabetes in her paternal grandfather; Heart disease in her maternal grandmother and paternal grandfather; Hypertension in her father and paternal grandfather; Leukemia in an other family member; Other in her maternal grandmother; Stroke in her paternal grandfather.   ROS:  Please see the history of present illness. Otherwise, complete review of systems is positive for none.  All other systems are reviewed and negative.   Physical Exam: VS:  BP 128/80   Pulse 65   Ht 5\' 5"  (1.651 m)   Wt 203 lb (92.1 kg)   SpO2 98%   BMI 33.78 kg/m , BMI Body mass index is 33.78 kg/m.  Wt Readings from Last 3 Encounters:  03/02/20 203 lb (92.1 kg)  06/11/19 192 lb (87.1 kg)  06/10/19 178 lb (80.7 kg)    General: Patient appears comfortable at rest. Neck: Supple, no elevated JVP or carotid bruits, no thyromegaly. Lungs: Clear to auscultation, nonlabored breathing at rest. Cardiac: Regular rate and rhythm, no S3 or significant systolic murmur, no pericardial  rub. Extremities: No pitting edema, distal pulses 2+. Skin: Warm and dry. Musculoskeletal: No kyphosis. Neuropsychiatric: Alert and oriented x3, affect grossly appropriate.  ECG:  An ECG dated 03/02/2020 was personally reviewed today and demonstrated:  Normal sinus rhythm rate of 65, rightward access.  No ectopy noted.  Recent Labwork: 05/30/2019: ALT 22; AST 21; BUN 10; Creatinine, Ser 0.82; Hemoglobin 14.1; Platelets 327; Potassium 4.3; Sodium 140 10/24/2019: TSH 1.971     Component Value Date/Time   CHOL 171 10/08/2018 1427   TRIG 200 (H) 10/08/2018 1427   HDL 51 10/08/2018 1427   CHOLHDL 3.4 10/08/2018 1427   CHOLHDL 3.2 01/07/2013 0949   VLDL 31 01/07/2013 0949   LDLCALC 80 10/08/2018 1427    Other Studies Reviewed Today:  Holter monitor 12/31/2015 Study Highlights  48 hour Holter monitor reviewed. Sinus rhythm and sinus tachycardia noted. Heart  rate ranged from 56 bpm up to 143 beats bpm with average heart rate 90 bpm. No sustained arrhythmias. Rare PACs and PVCs/fusion beat. No pauses.   Echocardiogram 12/29/2015. Study Conclusions   - Left ventricle: The cavity size was normal. Wall thickness was  normal. Systolic function was normal. The estimated ejection  fraction was in the range of 60% to 65%. Wall motion was normal;  there were no regional wall motion abnormalities. Left  ventricular diastolic function parameters were normal.   Assessment and Plan:  1. POTS (postural orthostatic tachycardia syndrome)   2. Essential hypertension   3. Palpitations   4. Current smoker    1. POTS (postural orthostatic tachycardia syndrome) States she continues to have rapid heart rates when she is more active.  She denies any postural changes with low blood pressures.  In fact she states her blood pressure has been creeping up recently.  States she has had episodes where she becomes hot suddenly and her heart races and she has corresponding increases in blood pressure.  Start  metoprolol 12.5 mg p.o. twice daily.  Monitor your heart rate, blood pressure, and come back for nursing visit in 2 weeks.  We may need to adjust medication.  2. Essential hypertension Patient states she is recently had blood pressure increases when she comes suddenly hot and tachycardic.  States her blood pressure only goes up during these episodes.  When she is at rest her blood pressures are usually within normal limits.  Blood pressure today on arrival was 128/80 and heart rate was 65.  She is currently not on any antihypertensive medications.  3. Palpitations Patient does not describe palpitations per se but does state her heart races when performing activity and sometimes without.  States recently she has been having what sounds like hot flash-like symptoms with increasing heart rates and associated increases in blood pressure only when these episodes occur.  She has not taken her pindolol in quite some time.  We are starting a low-dose metoprolol for her symptoms as noted above.  4. Current smoker Patient is currently smoking.  Counseled her on possibly stopping smoking if she is able to do so.  Medication Adjustments/Labs and Tests Ordered: Current medicines are reviewed at length with the patient today.  Concerns regarding medicines are outlined above.   Disposition: Follow-up with Dr. Harl Bowie or APP 3 months  Signed, Levell July, NP 03/02/2020 2:09 PM    [facility] [place], [place], [place] [address] Phone: [phone]; Fax: [phone]

## 2020-03-02 ENCOUNTER — Other Ambulatory Visit: Payer: Self-pay

## 2020-03-02 ENCOUNTER — Encounter: Payer: Self-pay | Admitting: Family Medicine

## 2020-03-02 ENCOUNTER — Ambulatory Visit: Payer: Medicare Other | Admitting: Family Medicine

## 2020-03-02 VITALS — BP 128/80 | HR 65 | Ht 65.0 in | Wt 203.0 lb

## 2020-03-02 DIAGNOSIS — I498 Other specified cardiac arrhythmias: Secondary | ICD-10-CM | POA: Diagnosis not present

## 2020-03-02 DIAGNOSIS — G90A Postural orthostatic tachycardia syndrome (POTS): Secondary | ICD-10-CM

## 2020-03-02 DIAGNOSIS — F17200 Nicotine dependence, unspecified, uncomplicated: Secondary | ICD-10-CM

## 2020-03-02 DIAGNOSIS — R002 Palpitations: Secondary | ICD-10-CM | POA: Diagnosis not present

## 2020-03-02 DIAGNOSIS — I10 Essential (primary) hypertension: Secondary | ICD-10-CM

## 2020-03-02 MED ORDER — METOPROLOL TARTRATE 25 MG PO TABS: 12.5000 mg | ORAL_TABLET | Freq: Two times a day (BID) | ORAL | 3 refills | Status: DC

## 2020-03-02 NOTE — Patient Instructions (Signed)
Medication Instructions:  Start Metoprolol (Lopressor) 12,5 mg twice a day   *If you need a refill on your cardiac medications before your next appointment, please call your pharmacy*   Lab Work: None ordered   If you have labs (blood work) drawn today and your tests are completely normal, you will receive your results only by: Marland Kitchen MyChart Message (if you have MyChart) OR . A paper copy in the mail If you have any lab test that is abnormal or we need to change your treatment, we will call you to review the results.   Testing/Procedures: None ordered    Follow-Up: At [facility], you and your health needs are our priority.  As part of our continuing mission to provide you with exceptional heart care, we have created designated Provider Care Teams.  These Care Teams include your primary Cardiologist (physician) and Advanced Practice Providers (APPs -  Physician Assistants and Nurse Practitioners) who all work together to provide you with the care you need, when you need it.  We recommend signing up for the patient portal called "MyChart".  Sign up information is provided on this After Visit Summary.  MyChart is used to connect with patients for Virtual Visits (Telemedicine).  Patients are able to view lab/test results, encounter notes, upcoming appointments, etc.  Non-urgent messages can be sent to your provider as well.   To learn more about what you can do with MyChart, go to [email or web address].    Your next appointment:   3 month(s)  The format for your next appointment:   In Person  Provider:   You may see Carlyle Dolly, MD or one of the following Advanced Practice Providers on your designated Care Team:    Bernerd Pho, PA-C   Ermalinda Barrios, PA-C   Return in 2 weeks for a nurse visit (Blood Pressure Check)

## 2020-03-16 ENCOUNTER — Ambulatory Visit (INDEPENDENT_AMBULATORY_CARE_PROVIDER_SITE_OTHER): Payer: Medicare Other

## 2020-03-16 ENCOUNTER — Other Ambulatory Visit: Payer: Self-pay

## 2020-03-16 ENCOUNTER — Telehealth: Payer: Self-pay

## 2020-03-16 VITALS — BP 98/58 | HR 66

## 2020-03-16 DIAGNOSIS — Z0130 Encounter for examination of blood pressure without abnormal findings: Secondary | ICD-10-CM

## 2020-03-16 MED ORDER — DILTIAZEM HCL 60 MG PO TABS: ORAL_TABLET | ORAL | 1 refills | Status: DC

## 2020-03-16 NOTE — Patient Instructions (Signed)
We will call you with instructions after [name], NP reviews your vital signs.

## 2020-03-16 NOTE — Progress Notes (Signed)
Patient c/o extreme fatigue on beta blocker.Has no energy and has been sleeping a lot. Had stopped drinking drinks with caffeine but feels she needs some now.

## 2020-03-16 NOTE — Telephone Encounter (Signed)
completed

## 2020-03-16 NOTE — Telephone Encounter (Signed)
Patient notified, escribed cardizem

## 2020-03-16 NOTE — Telephone Encounter (Signed)
-----   Message from Verta Ellen., NP sent at 03/16/2020  5:10 PM EDT ----- Regarding: Beta-blocker Tell her to stop the beta-blocker.  Start her on Cardizem 60 mg immediate release as needed daily as needed for palpitations.  Thank you

## 2020-03-22 ENCOUNTER — Encounter: Payer: Self-pay | Admitting: Family Medicine

## 2020-03-22 DIAGNOSIS — E559 Vitamin D deficiency, unspecified: Secondary | ICD-10-CM

## 2020-03-22 DIAGNOSIS — M2550 Pain in unspecified joint: Secondary | ICD-10-CM

## 2020-03-23 ENCOUNTER — Ambulatory Visit (INDEPENDENT_AMBULATORY_CARE_PROVIDER_SITE_OTHER): Payer: Medicare Other

## 2020-03-23 ENCOUNTER — Other Ambulatory Visit: Payer: Self-pay

## 2020-03-23 DIAGNOSIS — M2550 Pain in unspecified joint: Secondary | ICD-10-CM | POA: Diagnosis not present

## 2020-03-23 DIAGNOSIS — E559 Vitamin D deficiency, unspecified: Secondary | ICD-10-CM

## 2020-03-23 NOTE — Progress Notes (Signed)
Labs drawn per Dr. Junius Roads: vit D, uric acid, ESR, RF, CCP antibody, CRP & ANA

## 2020-03-24 ENCOUNTER — Encounter: Payer: Self-pay | Admitting: Family Medicine

## 2020-03-24 LAB — CBC WITH DIFFERENTIAL/PLATELET
Absolute Monocytes: 832 cells/uL (ref 200–950)
Basophils Absolute: 106 cells/uL (ref 0–200)
Basophils Relative: 0.8 %
Eosinophils Absolute: 224 cells/uL (ref 15–500)
Eosinophils Relative: 1.7 %
HCT: 40.4 % (ref 35.0–45.0)
Hemoglobin: 13.4 g/dL (ref 11.7–15.5)
Lymphs Abs: 3115 cells/uL (ref 850–3900)
MCH: 30.1 pg (ref 27.0–33.0)
MCHC: 33.2 g/dL (ref 32.0–36.0)
MCV: 90.8 fL (ref 80.0–100.0)
MPV: 10.7 fL (ref 7.5–12.5)
Monocytes Relative: 6.3 %
Neutro Abs: 8923 cells/uL — ABNORMAL HIGH (ref 1500–7800)
Neutrophils Relative %: 67.6 %
Platelets: 350 10*3/uL (ref 140–400)
RBC: 4.45 10*6/uL (ref 3.80–5.10)
RDW: 12.7 % (ref 11.0–15.0)
Total Lymphocyte: 23.6 %
WBC: 13.2 10*3/uL — ABNORMAL HIGH (ref 3.8–10.8)

## 2020-03-24 LAB — URIC ACID: Uric Acid, Serum: 4.8 mg/dL (ref 2.5–7.0)

## 2020-03-24 LAB — COMPLETE METABOLIC PANEL WITH GFR
AG Ratio: 1.7 (calc) (ref 1.0–2.5)
ALT: 12 U/L (ref 6–29)
AST: 13 U/L (ref 10–30)
Albumin: 4.2 g/dL (ref 3.6–5.1)
Alkaline phosphatase (APISO): 119 U/L (ref 31–125)
BUN: 9 mg/dL (ref 7–25)
CO2: 21 mmol/L (ref 20–32)
Calcium: 9.8 mg/dL (ref 8.6–10.2)
Chloride: 104 mmol/L (ref 98–110)
Creat: 0.84 mg/dL (ref 0.50–1.10)
GFR, Est African American: 101 mL/min/{1.73_m2} (ref >=60)
GFR, Est Non African American: 88 mL/min/{1.73_m2} (ref >=60)
Globulin: 2.5 g/dL (calc) (ref 1.9–3.7)
Glucose, Bld: 91 mg/dL (ref 65–99)
Potassium: 4.5 mmol/L (ref 3.5–5.3)
Sodium: 139 mmol/L (ref 135–146)
Total Bilirubin: 0.2 mg/dL (ref 0.2–1.2)
Total Protein: 6.7 g/dL (ref 6.1–8.1)

## 2020-03-24 LAB — C-REACTIVE PROTEIN: CRP: 41.7 mg/L — ABNORMAL HIGH (ref <8.0)

## 2020-03-24 LAB — RHEUMATOID FACTOR: Rheumatoid fact SerPl-aCnc: <14 IU/mL (ref <14)

## 2020-03-24 LAB — ANA: Anti Nuclear Antibody (ANA): NEGATIVE

## 2020-03-24 LAB — CYCLIC CITRUL PEPTIDE ANTIBODY, IGG: Cyclic Citrullin Peptide Ab: <16 UNITS

## 2020-03-24 LAB — VITAMIN D 25 HYDROXY (VIT D DEFICIENCY, FRACTURES): Vit D, 25-Hydroxy: 28 ng/mL — ABNORMAL LOW (ref 30–100)

## 2020-03-24 LAB — SEDIMENTATION RATE: Sed Rate: 14 mm/h (ref 0–20)

## 2020-03-24 MED ORDER — POTASSIUM CHLORIDE CRYS ER 10 MEQ PO TBCR: EXTENDED_RELEASE_TABLET | ORAL | 1 refills | Status: DC

## 2020-03-24 MED ORDER — FUROSEMIDE 20 MG PO TABS: ORAL_TABLET | ORAL | 1 refills | Status: DC

## 2020-03-24 NOTE — Addendum Note (Signed)
Addended by: Hortencia Pilar on: 03/24/2020 08:48 AM   Modules accepted: Orders

## 2020-03-25 ENCOUNTER — Ambulatory Visit (INDEPENDENT_AMBULATORY_CARE_PROVIDER_SITE_OTHER): Payer: Medicare Other | Admitting: Clinical

## 2020-03-25 ENCOUNTER — Other Ambulatory Visit: Payer: Self-pay

## 2020-03-25 ENCOUNTER — Telehealth: Payer: Self-pay | Admitting: Family Medicine

## 2020-03-25 DIAGNOSIS — F4310 Post-traumatic stress disorder, unspecified: Secondary | ICD-10-CM | POA: Diagnosis not present

## 2020-03-25 DIAGNOSIS — M2550 Pain in unspecified joint: Secondary | ICD-10-CM

## 2020-03-25 DIAGNOSIS — F332 Major depressive disorder, recurrent severe without psychotic features: Secondary | ICD-10-CM | POA: Diagnosis not present

## 2020-03-25 DIAGNOSIS — R7982 Elevated C-reactive protein (CRP): Secondary | ICD-10-CM

## 2020-03-25 NOTE — Progress Notes (Signed)
  Virtual Visit via Video Note  I connected withAmy Isleyon 06/24/21at 9:00 AM EDTby a video enabled telemedicine application and verified that I am speaking with the correct person using two identifiers.  Location: Patient:Home Provider:Office  I discussed the limitations of evaluation and management by telemedicine and the availability of in person appointments. The patient expressed understanding and agreed to proceed.     THERAPIST PROGRESS NOTE  Session Time:9:00AM-9:40AM  Participation Level:Active  Behavioral Response:CasualAlertDepressed  Type of Therapy:Individual Therapy  Treatment Goals addressed:Coping  Interventions:CBT, Motivational Interviewing, Strength-based and Supportive  Summary:Jayanna Isleyis a 39 y.o.femalewho presents withDepression and PTSD.The OPT therapist worked with thepatientfor herongoing OPT treatment.The OPT therapist utilized Motivational Interviewing to assist in creating therapeutic repore. The patient in the session was engaged and work in Science writer about hertriggers and symptoms over the past few weeksincluding ongoing difficulty withmobility due to a foot and hip injury and noted she has seen her PCP and has been referred to a rumatologist.. The OPT therapist utilized Public relations account executive Therapy through cognitive restructuring as well ascontinued to try to empower the patientto challenge automatic negative thoughtsanwork with the patient on coping strategies to assist in management ofDepression. The OPT therapist provided ongoing psychoeducation during the session with the patient on Depression.  Suicidal/Homicidal:Nowithout intent/plan  Therapist Response:The OPT therapist worked with the patient for the patients scheduled session. The patient was engaged in hersession and gave feedback in relation to triggers, symptoms, and behavior responses over the pastfewweeks including the  impact ofongoing physical injury on her mental health.The OPT therapist worked with the patient utilizing an in session Cognitive Behavioral Therapy exercise. The patient was responsive in the session and verbalized, " I amgoing to continue to use coping skills and have enjoyed going and caring for my family member who is in the early stages of dementia,  I may soon be taking care of her more full time".The OPT therapist worked with the patient onimproving her thought processes and managing unproductive automatic negative thoughts..The OPT therapist will continue treatment work with the patient in hernext scheduled session  Plan: Return again in2/3weeks.  Diagnosis:Axis I:Major depressive disorder, recurrent, severe without psychotic featuresandPTSD (post-traumatic stress disorder)  Axis II:No diagnosis   I discussed the assessment and treatment plan with the patient. The patient was provided an opportunity to ask questions and all were answered. The patient agreed with the plan and demonstrated an understanding of the instructions.  The patient was advised to call back or seek an in-person evaluation if the symptoms worsen or if the condition fails to improve as anticipated.  I provided2minutes of non-face-to-face time during this encounter.   Lennox Grumbles, LCSW 03/25/2020

## 2020-03-25 NOTE — Addendum Note (Signed)
Addended by: Hortencia Pilar on: 03/25/2020 08:11 AM   Modules accepted: Orders

## 2020-03-25 NOTE — Telephone Encounter (Signed)
Labs are notable for the following:  White blood cell count is elevated at 13.2 along with neutrophil levels.  C-reactive protein is elevated as well at 41.7.  May need to get specialist involved.  Possibly infectious disease vs rheumatology.  Vitamin D remains low at 28.  Will confirm current dosage and make adjustment.  Other labs are in normal range.

## 2020-03-26 ENCOUNTER — Encounter (HOSPITAL_COMMUNITY): Payer: Self-pay

## 2020-03-26 MED ORDER — GABAPENTIN 300 MG PO CAPS
ORAL_CAPSULE | ORAL | 5 refills | Status: DC
Start: 2020-03-26 — End: 2020-11-03

## 2020-03-26 NOTE — Addendum Note (Signed)
Addended by: Hortencia Pilar on: 03/26/2020 08:35 AM   Modules accepted: Orders

## 2020-03-29 ENCOUNTER — Encounter (HOSPITAL_COMMUNITY): Payer: Self-pay | Admitting: Psychiatry

## 2020-03-29 ENCOUNTER — Encounter: Payer: Self-pay | Admitting: Family Medicine

## 2020-03-29 ENCOUNTER — Other Ambulatory Visit: Payer: Self-pay

## 2020-03-29 ENCOUNTER — Telehealth (INDEPENDENT_AMBULATORY_CARE_PROVIDER_SITE_OTHER): Payer: Medicare Other | Admitting: Psychiatry

## 2020-03-29 DIAGNOSIS — F4310 Post-traumatic stress disorder, unspecified: Secondary | ICD-10-CM

## 2020-03-29 DIAGNOSIS — R0683 Snoring: Secondary | ICD-10-CM

## 2020-03-29 DIAGNOSIS — R5383 Other fatigue: Secondary | ICD-10-CM

## 2020-03-29 DIAGNOSIS — F332 Major depressive disorder, recurrent severe without psychotic features: Secondary | ICD-10-CM | POA: Diagnosis not present

## 2020-03-29 MED ORDER — LITHIUM CARBONATE 300 MG PO TABS: 600.0000 mg | ORAL_TABLET | Freq: Every day | ORAL | 2 refills | Status: DC

## 2020-03-29 MED ORDER — ALPRAZOLAM 1 MG PO TABS: 1.0000 mg | ORAL_TABLET | Freq: Four times a day (QID) | ORAL | 2 refills | Status: DC

## 2020-03-29 MED ORDER — PAROXETINE HCL 20 MG PO TABS: 20.0000 mg | ORAL_TABLET | Freq: Every day | ORAL | 2 refills | Status: DC

## 2020-03-29 NOTE — Progress Notes (Signed)
Virtual Visit via Video Note  I connected with Destiny Orr on 03/29/20 at 11:20 AM EDT by a video enabled telemedicine application and verified that I am speaking with the correct person using two identifiers.   I discussed the limitations of evaluation and management by telemedicine and the availability of in person appointments. The patient expressed understanding and agreed to proceed.    I discussed the assessment and treatment plan with the patient. The patient was provided an opportunity to ask questions and all were answered. The patient agreed with the plan and demonstrated an understanding of the instructions.   The patient was advised to call back or seek an in-person evaluation if the symptoms worsen or if the condition fails to improve as anticipated.  I provided 15 minutes of non-face-to-face time during this encounter. Location: Provider office, patient home  [name], MD  [facility] MD/PA/NP OP Progress Note  03/29/2020 11:34 AM Destiny Orr  MRN:  [id number]  Chief Complaint:  Chief Complaint    Anxiety; Depression; Fatigue; Follow-up     HPI: This patient is a 39 year old separated white female who lives with her parents 2 daughters and 1 son in [place].  She is on disability from [employer] and also gets Social Security disability.  The patient returns for follow-up after 4 weeks.  She still is dealing with significant fatigue.  She sleeps about 12 hours a night and still feels sleepy and takes naps during the day.  She feels like it is hard to keep her eyes open.  She states this predated the Rexulti but the Rexulti really has not helped much with mood so we may as well as stop it just to make sure it is not contributing to her drowsiness.  She also states that the fatigue predated the Paxil and lithium.  She does think these medications of helped a great deal with her depression and mood swings.  She has not yet arranged a sleep study.  She is slated to see a  rheumatologist because her CRP was elevated and she has also had a lot of edema.  She has chronic joint pain in most of her peripheral joints.  She denies severe depression or suicidal ideation Visit Diagnosis:    ICD-10-CM   1. Major depressive disorder, recurrent, severe without psychotic features ([place])  F33.2   2. PTSD (post-traumatic stress disorder)  F43.10     Past Psychiatric History: Long-term outpatient treatment for depression and anxiety  Past Medical History:  Past Medical History:  Diagnosis Date  . Abnormal Papanicolaou smear of cervix with positive human papilloma virus (HPV) test 10/11/2018   LSIL +HPV, get colpo___  . Abnormal uterine bleeding (AUB) 12/09/2014  . Anxiety   . Arthritis   . Depression   . DJD (degenerative joint disease)   . Dumping syndrome   . Dysmenorrhea 07/15/2014  . Fatigue 12/24/2012  . Fibromyalgia diagnosed April 2016  . Headache   . Hx of migraine headaches 12/24/2012  . Inappropriate sinus tachycardia   . Irregular menstrual bleeding 07/15/2014  . Pelvic pain in female 03/03/2016  . Pneumothorax, spontaneous, tension   . Post concussion syndrome   . POTS (postural orthostatic tachycardia syndrome)   . PTSD (post-traumatic stress disorder)   . Scoliosis   . Thyroid disease   . Tick bite 03/03/2016  . Vitamin B 12 deficiency     Past Surgical History:  Procedure Laterality Date  . bone spurs toes Right   . CESAREAN  SECTION    . COLONOSCOPY    . endooscopy    . KNEE SURGERY    . rotate cuff lt arm      Family Psychiatric History: see below  Family History:  Family History  Problem Relation Age of Onset  . Hypertension Father   . Atrial fibrillation Father   . Alcohol abuse Father   . Cancer Maternal Grandmother        skin   . Heart disease Maternal Grandmother   . Other Maternal Grandmother        had thyroid removed  . Breast cancer Maternal Grandmother   . Heart disease Paternal Grandfather   . COPD Paternal Grandfather    . Hypertension Paternal Grandfather   . Diabetes Paternal Grandfather   . Stroke Paternal Grandfather   . Cancer Maternal Grandfather        bladder,lung  . Anxiety disorder Maternal Aunt   . Anxiety disorder Maternal Uncle   . Bipolar disorder Maternal Uncle   . Breast cancer Maternal Uncle        CML  . Leukemia Other   . Colon cancer Other        lung-2 maternal great uncles    Social History:  Social History   Socioeconomic History  . Marital status: Legally Separated    Spouse name: Not on file  . Number of children: 3  . Years of education: Not on file  . Highest education level: Not on file  Occupational History  . Not on file  Tobacco Use  . Smoking status: Current Every Day Smoker    Packs/day: 1.00    Years: 15.00    Pack years: 15.00    Types: Cigarettes    Start date: 03/13/1997  . Smokeless tobacco: Never Used  . Tobacco comment: "working on it" Was smoking 2.5 packs a day  Vaping Use  . Vaping Use: Never used  Substance and Sexual Activity  . Alcohol use: No  . Drug use: No  . Sexual activity: Yes    Partners: Male    Birth control/protection: None  Other Topics Concern  . Not on file  Social History Narrative  . Not on file   Social Determinants of Health   Financial Resource Strain:   . Difficulty of Paying Living Expenses:   Food Insecurity:   . Worried About Charity fundraiser in the Last Year:   . Arboriculturist in the Last Year:   Transportation Needs:   . Film/video editor (Medical):   Marland Kitchen Lack of Transportation (Non-Medical):   Physical Activity:   . Days of Exercise per Week:   . Minutes of Exercise per Session:   Stress:   . Feeling of Stress :   Social Connections:   . Frequency of Communication with Friends and Family:   . Frequency of Social Gatherings with Friends and Family:   . Attends Religious Services:   . Active Member of Clubs or Organizations:   . Attends Archivist Meetings:   Marland Kitchen Marital Status:      Allergies:  Allergies  Allergen Reactions  . Prozac [Fluoxetine]     Suicidal thoughts  . Topiramate Other (See Comments)    Topamax-dizziness  . Lexapro [Escitalopram Oxalate] Other (See Comments)    Flat affect, No emotions    Metabolic Disorder Labs: Lab Results  Component Value Date   HGBA1C 5.4 01/07/2013   MPG 108 01/07/2013   Lab Results  Component Value Date   PROLACTIN 13.2 06/12/2016   Lab Results  Component Value Date   CHOL 171 10/08/2018   TRIG 200 (H) 10/08/2018   HDL 51 10/08/2018   CHOLHDL 3.4 10/08/2018   VLDL 31 01/07/2013   LDLCALC 80 10/08/2018   LDLCALC 90 01/07/2013   Lab Results  Component Value Date   TSH 1.971 10/24/2019   TSH 1.480 03/05/2019    Therapeutic Level Labs: Lab Results  Component Value Date   LITHIUM 0.45 (L) 01/27/2020   LITHIUM 0.24 (L) 09/03/2019   No results found for: VALPROATE No components found for:  CBMZ  Current Medications: Current Outpatient Medications  Medication Sig Dispense Refill  . ALPRAZolam (XANAX) 1 MG tablet Take 1 tablet (1 mg total) by mouth 4 (four) times daily. 120 tablet 2  . Cholecalciferol (VITAMIN D3) 250 MCG (10000 UT) capsule Take 10,000 Units by mouth daily.    . Cyanocobalamin (B-12 PO) Take 1 tablet by mouth daily.    Marland Kitchen desogestrel-ethinyl estradiol (MIRCETTE) 0.15-0.02/0.01 MG (21/5) tablet TAKE (1) TABLET BY MOUTH ONCE DAILY. 28 tablet 12  . diltiazem (CARDIZEM) 60 MG tablet Take 60 mg as needed daily for palpitations 90 tablet 1  . furosemide (LASIX) 20 MG tablet 1 PO q AM and q noon prn edema 60 tablet 1  . gabapentin (NEURONTIN) 300 MG capsule 1 PO q HS, may increase to 1 PO TID if tolerated 90 capsule 5  . ibuprofen (ADVIL,MOTRIN) 200 MG tablet Take 400-600 mg by mouth every 6 (six) hours as needed for mild pain or moderate pain.    Marland Kitchen levothyroxine (SYNTHROID) 50 MCG tablet TAKE 1 TABLET BY MOUTH ONCE A DAY. 30 tablet 3  . lithium 300 MG tablet Take 2 tablets (600 mg total)  by mouth at bedtime. 60 tablet 2  . PARoxetine (PAXIL) 20 MG tablet Take 1 tablet (20 mg total) by mouth daily. 30 tablet 2  . potassium chloride (KLOR-CON) 10 MEQ tablet 1 PO BID when taking lasix 60 tablet 1   No current facility-administered medications for this visit.     Musculoskeletal: Strength & Muscle Tone: within normal limits Gait & Station: normal Patient leans: N/A  Psychiatric Specialty Exam: Review of Systems  Constitutional: Positive for fatigue.  Musculoskeletal: Positive for arthralgias, joint swelling and myalgias.  All other systems reviewed and are negative.   There were no vitals taken for this visit.There is no height or weight on file to calculate BMI.  General Appearance: Casual and Fairly Groomed  Eye Contact:  Good  Speech:  Clear and Coherent  Volume:  Normal  Mood:  Euthymic  Affect:  Appropriate and Congruent  Thought Process:  Goal Directed  Orientation:  Full (Time, Place, and Person)  Thought Content: Rumination   Suicidal Thoughts:  No  Homicidal Thoughts:  No  Memory:  Immediate;   Good Recent;   Good Remote;   Good  Judgement:  Good  Insight:  Fair  Psychomotor Activity:  Decreased  Concentration:  Concentration: Fair and Attention Span: Fair  Recall:  Good  Fund of Knowledge: Good  Language: Good  Akathisia:  No  Handed:  Right  AIMS (if indicated): not done  Assets:  Communication Skills Desire for Improvement Resilience Social Support Talents/Skills  ADL's:  Intact  Cognition: WNL  Sleep:  Good   Screenings: Mini-Mental     Office Visit from 10/25/2016 in [facility]  Total Score (max 30 points ) 29  PHQ2-9     Office Visit from 10/08/2018 in [place] Visit from 08/21/2017 in [place] Office Visit from 07/18/2017 in [facility] Counselor from 07/10/2017 in [place] Office Visit from 07/03/2017 in [facility]  PHQ-2 Total Score 6 4 0 5 0  PHQ-9 Total Score 21 21 -- 22 --       Assessment and Plan: This patient is a 39 year old female with a long-term dysthymia depression and numerous somatic complaints.  She now is being considered for possible rheumatological disorder.  Since the Rexulti is not helping we will discontinue it.  I again reiterated that she needs to have a sleep study and she is going to try to arrange this.  She will continue Paxil 20 mg at bedtime for depression, lithium 600 mg at bedtime for mood stabilization and Xanax 1 mg 4 times daily for anxiety.  She will return to see me in 4 weeks   [name], MD 03/29/2020, 11:34 AM

## 2020-04-01 ENCOUNTER — Ambulatory Visit: Payer: Medicare Other | Admitting: Orthopedic Surgery

## 2020-04-14 ENCOUNTER — Other Ambulatory Visit: Payer: Self-pay

## 2020-04-14 ENCOUNTER — Telehealth (HOSPITAL_COMMUNITY): Payer: Self-pay | Admitting: Clinical

## 2020-04-14 ENCOUNTER — Ambulatory Visit (HOSPITAL_COMMUNITY): Payer: Medicare Other | Admitting: Clinical

## 2020-04-14 NOTE — Telephone Encounter (Signed)
The OPT therapist sent text to session x2 @ 9:00AM and 9:10AM, however, the patient did not respond missing their scheduled session

## 2020-04-22 NOTE — Telephone Encounter (Signed)
DISGUARD

## 2020-04-28 ENCOUNTER — Other Ambulatory Visit: Payer: Self-pay

## 2020-04-28 ENCOUNTER — Encounter (HOSPITAL_COMMUNITY): Payer: Self-pay | Admitting: Psychiatry

## 2020-04-28 ENCOUNTER — Telehealth (INDEPENDENT_AMBULATORY_CARE_PROVIDER_SITE_OTHER): Payer: Medicare Other | Admitting: Psychiatry

## 2020-04-28 DIAGNOSIS — F4310 Post-traumatic stress disorder, unspecified: Secondary | ICD-10-CM

## 2020-04-28 DIAGNOSIS — F332 Major depressive disorder, recurrent severe without psychotic features: Secondary | ICD-10-CM | POA: Diagnosis not present

## 2020-04-28 MED ORDER — LITHIUM CARBONATE 300 MG PO TABS: 600.0000 mg | ORAL_TABLET | Freq: Every day | ORAL | 2 refills | Status: DC

## 2020-04-28 MED ORDER — ALPRAZOLAM 1 MG PO TABS: 1.0000 mg | ORAL_TABLET | Freq: Four times a day (QID) | ORAL | 2 refills | Status: DC

## 2020-04-28 MED ORDER — DULOXETINE HCL 60 MG PO CPEP
60.0000 mg | ORAL_CAPSULE | Freq: Every day | ORAL | 2 refills | Status: DC
Start: 2020-04-28 — End: 2020-05-28

## 2020-04-28 NOTE — Progress Notes (Signed)
Virtual Visit via Video Note  I connected with Destiny Orr on 04/28/20 at  1:00 PM EDT by a video enabled telemedicine application and verified that I am speaking with the correct person using two identifiers.   I discussed the limitations of evaluation and management by telemedicine and the availability of in person appointments. The patient expressed understanding and agreed to proceed.    I discussed the assessment and treatment plan with the patient. The patient was provided an opportunity to ask questions and all were answered. The patient agreed with the plan and demonstrated an understanding of the instructions.   The patient was advised to call back or seek an in-person evaluation if the symptoms worsen or if the condition fails to improve as anticipated.  I provided 15 minutes of non-face-to-face time during this encounter. Location: Patient home, provider Home  [name], MD  [facility] MD/PA/NP OP Progress Note  04/28/2020 1:19 PM Destiny Orr  MRN:  [id number]  Chief Complaint:  Chief Complaint    Depression; Anxiety; Follow-up; Fatigue     HPI: This patient is a 39 year old separated white female who lives with her parents 2 daughters and 1 son in [place].  She is on disability from [employer] and also gets Social Security disability.  The patient returns after 4 weeks.  She is still having significant fatigue.  She sleeps through the night and then takes several naps during the day.  She states that she finds she is asleep all of a sudden.  She has requested a sleep study and the order has been sent and but no one can seem to get a hold of the sleep lab to get a time for the test.  Last time she did not think it was due to psychiatric medications but she does not think the Paxil is particularly helping her depression.  She feels more depressed lately and also has a lot of anxiety and racing thoughts.  I suggest that we retry Cymbalta since it also helps chronic pain and she is  having a lot of pain in her left hand.  She is slated to see a rheumatologist but it is not till November.  She denies any thoughts of self-harm or suicide. Visit Diagnosis:    ICD-10-CM   1. Major depressive disorder, recurrent, severe without psychotic features ([place])  F33.2   2. PTSD (post-traumatic stress disorder)  F43.10     Past Psychiatric History: Long-term outpatient treatment for depression and anxiety  Past Medical History:  Past Medical History:  Diagnosis Date  . Abnormal Papanicolaou smear of cervix with positive human papilloma virus (HPV) test 10/11/2018   LSIL +HPV, get colpo___  . Abnormal uterine bleeding (AUB) 12/09/2014  . Anxiety   . Arthritis   . Depression   . DJD (degenerative joint disease)   . Dumping syndrome   . Dysmenorrhea 07/15/2014  . Fatigue 12/24/2012  . Fibromyalgia diagnosed April 2016  . Headache   . Hx of migraine headaches 12/24/2012  . Inappropriate sinus tachycardia   . Irregular menstrual bleeding 07/15/2014  . Pelvic pain in female 03/03/2016  . Pneumothorax, spontaneous, tension   . Post concussion syndrome   . POTS (postural orthostatic tachycardia syndrome)   . PTSD (post-traumatic stress disorder)   . Scoliosis   . Thyroid disease   . Tick bite 03/03/2016  . Vitamin B 12 deficiency     Past Surgical History:  Procedure Laterality Date  . bone spurs toes Right   .  CESAREAN SECTION    . COLONOSCOPY    . endooscopy    . KNEE SURGERY    . rotate cuff lt arm      Family Psychiatric History: see below  Family History:  Family History  Problem Relation Age of Onset  . Hypertension Father   . Atrial fibrillation Father   . Alcohol abuse Father   . Cancer Maternal Grandmother        skin   . Heart disease Maternal Grandmother   . Other Maternal Grandmother        had thyroid removed  . Breast cancer Maternal Grandmother   . Heart disease Paternal Grandfather   . COPD Paternal Grandfather   . Hypertension Paternal Grandfather    . Diabetes Paternal Grandfather   . Stroke Paternal Grandfather   . Cancer Maternal Grandfather        bladder,lung  . Anxiety disorder Maternal Aunt   . Anxiety disorder Maternal Uncle   . Bipolar disorder Maternal Uncle   . Breast cancer Maternal Uncle        CML  . Leukemia Other   . Colon cancer Other        lung-2 maternal great uncles    Social History:  Social History   Socioeconomic History  . Marital status: Legally Separated    Spouse name: Not on file  . Number of children: 3  . Years of education: Not on file  . Highest education level: Not on file  Occupational History  . Not on file  Tobacco Use  . Smoking status: Current Every Day Smoker    Packs/day: 1.00    Years: 15.00    Pack years: 15.00    Types: Cigarettes    Start date: 03/13/1997  . Smokeless tobacco: Never Used  . Tobacco comment: "working on it" Was smoking 2.5 packs a day  Vaping Use  . Vaping Use: Never used  Substance and Sexual Activity  . Alcohol use: No  . Drug use: No  . Sexual activity: Yes    Partners: Male    Birth control/protection: None  Other Topics Concern  . Not on file  Social History Narrative  . Not on file   Social Determinants of Health   Financial Resource Strain:   . Difficulty of Paying Living Expenses:   Food Insecurity:   . Worried About Charity fundraiser in the Last Year:   . Arboriculturist in the Last Year:   Transportation Needs:   . Film/video editor (Medical):   Marland Kitchen Lack of Transportation (Non-Medical):   Physical Activity:   . Days of Exercise per Week:   . Minutes of Exercise per Session:   Stress:   . Feeling of Stress :   Social Connections:   . Frequency of Communication with Friends and Family:   . Frequency of Social Gatherings with Friends and Family:   . Attends Religious Services:   . Active Member of Clubs or Organizations:   . Attends Archivist Meetings:   Marland Kitchen Marital Status:     Allergies:  Allergies   Allergen Reactions  . Prozac [Fluoxetine]     Suicidal thoughts  . Topiramate Other (See Comments)    Topamax-dizziness  . Lexapro [Escitalopram Oxalate] Other (See Comments)    Flat affect, No emotions    Metabolic Disorder Labs: Lab Results  Component Value Date   HGBA1C 5.4 01/07/2013   MPG 108 01/07/2013   Lab Results  Component Value Date   PROLACTIN 13.2 06/12/2016   Lab Results  Component Value Date   CHOL 171 10/08/2018   TRIG 200 (H) 10/08/2018   HDL 51 10/08/2018   CHOLHDL 3.4 10/08/2018   VLDL 31 01/07/2013   LDLCALC 80 10/08/2018   LDLCALC 90 01/07/2013   Lab Results  Component Value Date   TSH 1.971 10/24/2019   TSH 1.480 03/05/2019    Therapeutic Level Labs: Lab Results  Component Value Date   LITHIUM 0.45 (L) 01/27/2020   LITHIUM 0.24 (L) 09/03/2019   No results found for: VALPROATE No components found for:  CBMZ  Current Medications: Current Outpatient Medications  Medication Sig Dispense Refill  . ALPRAZolam (XANAX) 1 MG tablet Take 1 tablet (1 mg total) by mouth 4 (four) times daily. 120 tablet 2  . Cholecalciferol (VITAMIN D3) 250 MCG (10000 UT) capsule Take 10,000 Units by mouth daily.    . Cyanocobalamin (B-12 PO) Take 1 tablet by mouth daily.    Marland Kitchen desogestrel-ethinyl estradiol (MIRCETTE) 0.15-0.02/0.01 MG (21/5) tablet TAKE (1) TABLET BY MOUTH ONCE DAILY. 28 tablet 12  . diltiazem (CARDIZEM) 60 MG tablet Take 60 mg as needed daily for palpitations 90 tablet 1  . DULoxetine (CYMBALTA) 60 MG capsule Take 1 capsule (60 mg total) by mouth daily. 30 capsule 2  . furosemide (LASIX) 20 MG tablet 1 PO q AM and q noon prn edema 60 tablet 1  . gabapentin (NEURONTIN) 300 MG capsule 1 PO q HS, may increase to 1 PO TID if tolerated 90 capsule 5  . ibuprofen (ADVIL,MOTRIN) 200 MG tablet Take 400-600 mg by mouth every 6 (six) hours as needed for mild pain or moderate pain.    Marland Kitchen levothyroxine (SYNTHROID) 50 MCG tablet TAKE 1 TABLET BY MOUTH ONCE A DAY.  30 tablet 3  . lithium 300 MG tablet Take 2 tablets (600 mg total) by mouth at bedtime. 60 tablet 2  . potassium chloride (KLOR-CON) 10 MEQ tablet 1 PO BID when taking lasix 60 tablet 1   No current facility-administered medications for this visit.     Musculoskeletal: Strength & Muscle Tone: within normal limits Gait & Station: normal Patient leans: N/A  Psychiatric Specialty Exam: Review of Systems  Constitutional: Positive for fatigue.  Musculoskeletal: Positive for arthralgias and joint swelling.  Psychiatric/Behavioral: Positive for dysphoric mood. The patient is nervous/anxious.   All other systems reviewed and are negative.   There were no vitals taken for this visit.There is no height or weight on file to calculate BMI.  General Appearance: Casual and Fairly Groomed  Eye Contact:  Good  Speech:  Clear and Coherent  Volume:  Normal  Mood:  Dysphoric  Affect:  Flat  Thought Process:  Goal Directed  Orientation:  Full (Time, Place, and Person)  Thought Content: Rumination   Suicidal Thoughts:  No  Homicidal Thoughts:  No  Memory:  Immediate;   Good Recent;   Good Remote;   Good  Judgement:  Good  Insight:  Fair  Psychomotor Activity:  Decreased  Concentration:  Concentration: Fair and Attention Span: Fair  Recall:  Good  Fund of Knowledge: Good  Language: Good  Akathisia:  No  Handed:  Right  AIMS (if indicated): not done  Assets:  Communication Skills Desire for Improvement Resilience Social Support Talents/Skills  ADL's:  Intact  Cognition: WNL  Sleep:  Good   Screenings: Mini-Mental     Office Visit from 10/25/2016 in [facility]  Total Score (  max 30 points ) 29    PHQ2-9     Office Visit from 10/08/2018 in [place] Visit from 08/21/2017 in [facility] Office Visit from 07/18/2017 in [facility] Counselor from 07/10/2017 in [place] [facility]  Visit from 07/03/2017 in [facility]  PHQ-2 Total Score 6 4 0 5 0  PHQ-9 Total Score 21 21 -- 22 --       Assessment and Plan: This patient is a 39 year old female with long-term dysthymia depression and numerous somatic complaints.  She is now slated to have a sleep study.  Since the Paxil may be causing sedation and is not helping we will switch to Cymbalta 60 mg daily for treatment of depression and chronic pain.  She will continue lithium 600 mg at bedtime for mood stabilization and Xanax 1 mg 4 times daily for anxiety.  She will return to see me in 4 weeks   [name], MD 04/28/2020, 1:19 PM

## 2020-04-29 ENCOUNTER — Telehealth (HOSPITAL_COMMUNITY): Payer: Self-pay | Admitting: Clinical

## 2020-04-29 ENCOUNTER — Other Ambulatory Visit: Payer: Self-pay

## 2020-04-29 ENCOUNTER — Ambulatory Visit (HOSPITAL_COMMUNITY): Payer: Medicare Other | Admitting: Clinical

## 2020-04-29 NOTE — Telephone Encounter (Signed)
The OPT therapist attempted text to session x2 @ 8:00AM and 8:10AM, however, the patient did not respond missing their scheduled session.

## 2020-05-04 ENCOUNTER — Telehealth (HOSPITAL_COMMUNITY): Payer: Self-pay | Admitting: *Deleted

## 2020-05-04 NOTE — Telephone Encounter (Signed)
[employer] called for provider to call them and provider do not need to have an appt to call them. Per rep from [employer] it's about on going care for outpatient services for patient and provider can speak with any of their Licensed Care Advocate [phone]. They need the call within 1 week.

## 2020-05-05 NOTE — Telephone Encounter (Signed)
The lady I spoke to had no idea what this was about, if they call back ask specifically what information they need

## 2020-05-05 NOTE — Telephone Encounter (Signed)
Noted and patient is aware 

## 2020-05-19 ENCOUNTER — Telehealth (HOSPITAL_COMMUNITY): Payer: Self-pay | Admitting: *Deleted

## 2020-05-19 NOTE — Telephone Encounter (Signed)
[employer] called for provider to call them and provider do not need to have an appt to call them. Per rep from [employer] it's about on going care for outpatient services for patient and provider can speak with any of their Licensed Care Advocate [phone]. They need the call within 1 week.  Spoke with Cathren Harsh and she apologized about the previous call that provider made when she called but she does need provider to call to discuss patient care

## 2020-05-20 NOTE — Telephone Encounter (Signed)
They actually wanted to speak with Destiny Orr

## 2020-05-20 NOTE — Telephone Encounter (Signed)
Spoke with patient and she stated she do not know who [employer] is that's trying to reach patient. Informed patient that if [employer] tries to reach out again to provider they will have to have a release of information signed by her(pt) first before staff can send the message and patient agreed. Staff also informed patient that [employer] will have to reach out to her(pt) now and patient agreed and verbalized understanding.

## 2020-05-28 ENCOUNTER — Other Ambulatory Visit: Payer: Self-pay

## 2020-05-28 ENCOUNTER — Telehealth (INDEPENDENT_AMBULATORY_CARE_PROVIDER_SITE_OTHER): Payer: Medicare Other | Admitting: Psychiatry

## 2020-05-28 ENCOUNTER — Encounter (HOSPITAL_COMMUNITY): Payer: Self-pay | Admitting: Psychiatry

## 2020-05-28 DIAGNOSIS — F332 Major depressive disorder, recurrent severe without psychotic features: Secondary | ICD-10-CM

## 2020-05-28 MED ORDER — DULOXETINE HCL 60 MG PO CPEP: 60.0000 mg | ORAL_CAPSULE | Freq: Every day | ORAL | 2 refills | Status: DC

## 2020-05-28 MED ORDER — LITHIUM CARBONATE 300 MG PO TABS: 600.0000 mg | ORAL_TABLET | Freq: Every day | ORAL | 2 refills | Status: DC

## 2020-05-28 MED ORDER — AMPHETAMINE-DEXTROAMPHETAMINE 10 MG PO TABS: 10.0000 mg | ORAL_TABLET | Freq: Two times a day (BID) | ORAL | 0 refills | Status: DC

## 2020-05-28 MED ORDER — ALPRAZOLAM 1 MG PO TABS: 1.0000 mg | ORAL_TABLET | Freq: Four times a day (QID) | ORAL | 2 refills | Status: DC

## 2020-05-28 NOTE — Progress Notes (Signed)
Virtual Visit via Video Note  I connected with Destiny Orr on 05/28/20 at 11:00 AM EDT by a video enabled telemedicine application and verified that I am speaking with the correct person using two identifiers.   I discussed the limitations of evaluation and management by telemedicine and the availability of in person appointments. The patient expressed understanding and agreed to proceed.    I discussed the assessment and treatment plan with the patient. The patient was provided an opportunity to ask questions and all were answered. The patient agreed with the plan and demonstrated an understanding of the instructions.   The patient was advised to call back or seek an in-person evaluation if the symptoms worsen or if the condition fails to improve as anticipated.  I provided 15 minutes of non-face-to-face time during this encounter. Location: Provider office, patient home  [name], MD  [facility] MD/PA/NP OP Progress Note  05/28/2020 11:18 AM Destiny Orr  MRN:  [id number]  Chief Complaint:  Chief Complaint    Anxiety; Depression; Follow-up     HPI: This patient is a 39 year old separated white female who lives with her parents 2 daughters and 1 son in [place].  She is on disability from [employer] and also gets Social Security disability.  The patient returns after 4 weeks.  She continues to have significant problems with fatigue.  She sleeps through the night and also do much of the day.  She is still awaiting a sleep study.  She is also supposed to see a rheumatologist because she has a lot of joint pain.  Last time we changed from Paxil to Cymbalta but she has not seen a whole lot of change.  Given the amount of fatigue I suggested she go back on the Adderall and she agrees.  She denies severe depression or suicidal ideation racing thoughts or other manic symptoms. Visit Diagnosis:    ICD-10-CM   1. Major depressive disorder, recurrent, severe without psychotic features ([place])   F33.2     Past Psychiatric History: Long-term outpatient treatment for depression and anxiety  Past Medical History:  Past Medical History:  Diagnosis Date  . Abnormal Papanicolaou smear of cervix with positive human papilloma virus (HPV) test 10/11/2018   LSIL +HPV, get colpo___  . Abnormal uterine bleeding (AUB) 12/09/2014  . Anxiety   . Arthritis   . Depression   . DJD (degenerative joint disease)   . Dumping syndrome   . Dysmenorrhea 07/15/2014  . Fatigue 12/24/2012  . Fibromyalgia diagnosed April 2016  . Headache   . Hx of migraine headaches 12/24/2012  . Inappropriate sinus tachycardia   . Irregular menstrual bleeding 07/15/2014  . Pelvic pain in female 03/03/2016  . Pneumothorax, spontaneous, tension   . Post concussion syndrome   . POTS (postural orthostatic tachycardia syndrome)   . PTSD (post-traumatic stress disorder)   . Scoliosis   . Thyroid disease   . Tick bite 03/03/2016  . Vitamin B 12 deficiency     Past Surgical History:  Procedure Laterality Date  . bone spurs toes Right   . CESAREAN SECTION    . COLONOSCOPY    . endooscopy    . KNEE SURGERY    . rotate cuff lt arm      Family Psychiatric History: see below  Family History:  Family History  Problem Relation Age of Onset  . Hypertension Father   . Atrial fibrillation Father   . Alcohol abuse Father   . Cancer Maternal Grandmother  skin   . Heart disease Maternal Grandmother   . Other Maternal Grandmother        had thyroid removed  . Breast cancer Maternal Grandmother   . Heart disease Paternal Grandfather   . COPD Paternal Grandfather   . Hypertension Paternal Grandfather   . Diabetes Paternal Grandfather   . Stroke Paternal Grandfather   . Cancer Maternal Grandfather        bladder,lung  . Anxiety disorder Maternal Aunt   . Anxiety disorder Maternal Uncle   . Bipolar disorder Maternal Uncle   . Breast cancer Maternal Uncle        CML  . Leukemia Other   . Colon cancer Other         lung-2 maternal great uncles    Social History:  Social History   Socioeconomic History  . Marital status: Legally Separated    Spouse name: Not on file  . Number of children: 3  . Years of education: Not on file  . Highest education level: Not on file  Occupational History  . Not on file  Tobacco Use  . Smoking status: Current Every Day Smoker    Packs/day: 1.00    Years: 15.00    Pack years: 15.00    Types: Cigarettes    Start date: 03/13/1997  . Smokeless tobacco: Never Used  . Tobacco comment: "working on it" Was smoking 2.5 packs a day  Vaping Use  . Vaping Use: Never used  Substance and Sexual Activity  . Alcohol use: No  . Drug use: No  . Sexual activity: Yes    Partners: Male    Birth control/protection: None  Other Topics Concern  . Not on file  Social History Narrative  . Not on file   Social Determinants of Health   Financial Resource Strain:   . Difficulty of Paying Living Expenses: Not on file  Food Insecurity:   . Worried About Charity fundraiser in the Last Year: Not on file  . Ran Out of Food in the Last Year: Not on file  Transportation Needs:   . Lack of Transportation (Medical): Not on file  . Lack of Transportation (Non-Medical): Not on file  Physical Activity:   . Days of Exercise per Week: Not on file  . Minutes of Exercise per Session: Not on file  Stress:   . Feeling of Stress : Not on file  Social Connections:   . Frequency of Communication with Friends and Family: Not on file  . Frequency of Social Gatherings with Friends and Family: Not on file  . Attends Religious Services: Not on file  . Active Member of Clubs or Organizations: Not on file  . Attends Archivist Meetings: Not on file  . Marital Status: Not on file    Allergies:  Allergies  Allergen Reactions  . Prozac [Fluoxetine]     Suicidal thoughts  . Topiramate Other (See Comments)    Topamax-dizziness  . Lexapro [Escitalopram Oxalate] Other (See Comments)     Flat affect, No emotions    Metabolic Disorder Labs: Lab Results  Component Value Date   HGBA1C 5.4 01/07/2013   MPG 108 01/07/2013   Lab Results  Component Value Date   PROLACTIN 13.2 06/12/2016   Lab Results  Component Value Date   CHOL 171 10/08/2018   TRIG 200 (H) 10/08/2018   HDL 51 10/08/2018   CHOLHDL 3.4 10/08/2018   VLDL 31 01/07/2013   LDLCALC 80 10/08/2018  [place] 90 01/07/2013   Lab Results  Component Value Date   TSH 1.971 10/24/2019   TSH 1.480 03/05/2019    Therapeutic Level Labs: Lab Results  Component Value Date   LITHIUM 0.45 (L) 01/27/2020   LITHIUM 0.24 (L) 09/03/2019   No results found for: VALPROATE No components found for:  CBMZ  Current Medications: Current Outpatient Medications  Medication Sig Dispense Refill  . ALPRAZolam (XANAX) 1 MG tablet Take 1 tablet (1 mg total) by mouth 4 (four) times daily. 120 tablet 2  . amphetamine-dextroamphetamine (ADDERALL) 10 MG tablet Take 1 tablet (10 mg total) by mouth 2 (two) times daily with a meal. 60 tablet 0  . Cholecalciferol (VITAMIN D3) 250 MCG (10000 UT) capsule Take 10,000 Units by mouth daily.    . Cyanocobalamin (B-12 PO) Take 1 tablet by mouth daily.    Marland Kitchen desogestrel-ethinyl estradiol (MIRCETTE) 0.15-0.02/0.01 MG (21/5) tablet TAKE (1) TABLET BY MOUTH ONCE DAILY. 28 tablet 12  . diltiazem (CARDIZEM) 60 MG tablet Take 60 mg as needed daily for palpitations 90 tablet 1  . DULoxetine (CYMBALTA) 60 MG capsule Take 1 capsule (60 mg total) by mouth daily. 30 capsule 2  . furosemide (LASIX) 20 MG tablet 1 PO q AM and q noon prn edema 60 tablet 1  . gabapentin (NEURONTIN) 300 MG capsule 1 PO q HS, may increase to 1 PO TID if tolerated 90 capsule 5  . ibuprofen (ADVIL,MOTRIN) 200 MG tablet Take 400-600 mg by mouth every 6 (six) hours as needed for mild pain or moderate pain.    Marland Kitchen levothyroxine (SYNTHROID) 50 MCG tablet TAKE 1 TABLET BY MOUTH ONCE A DAY. 30 tablet 3  . lithium 300 MG tablet Take  2 tablets (600 mg total) by mouth at bedtime. 60 tablet 2  . potassium chloride (KLOR-CON) 10 MEQ tablet 1 PO BID when taking lasix 60 tablet 1   No current facility-administered medications for this visit.     Musculoskeletal: Strength & Muscle Tone: within normal limits Gait & Station: normal Patient leans: N/A  Psychiatric Specialty Exam: Review of Systems  Constitutional: Positive for fever.  Musculoskeletal: Positive for arthralgias, back pain, joint swelling and myalgias.  Psychiatric/Behavioral: Positive for decreased concentration.  All other systems reviewed and are negative.   There were no vitals taken for this visit.There is no height or weight on file to calculate BMI.  General Appearance: Casual and Fairly Groomed  Eye Contact:  Good  Speech:  Clear and Coherent  Volume:  Normal  Mood:  flat  Affect:  Constricted  Thought Process:  Goal Directed  Orientation:  Full (Time, Place, and Person)  Thought Content: Rumination   Suicidal Thoughts:  No  Homicidal Thoughts:  No  Memory:  Immediate;   Good Recent;   Good Remote;   Good  Judgement:  Good  Insight:  Fair  Psychomotor Activity:  Decreased  Concentration:  Concentration: Fair and Attention Span: Fair  Recall:  Good  Fund of Knowledge: Good  Language: Good  Akathisia:  No  Handed:  Right  AIMS (if indicated): not done  Assets:  Communication Skills Desire for Improvement Resilience Social Support Talents/Skills  ADL's:  Intact  Cognition: WNL  Sleep:  Good   Screenings: Mini-Mental     Office Visit from 10/25/2016 in [facility]  Total Score (max 30 points ) 29    PHQ2-9     Office Visit from 10/08/2018 in [facility] Office Visit from 08/21/2017 in  [facility] Office Visit from 07/18/2017 in [facility] Counselor from 07/10/2017 in [place] Office Visit from 07/03/2017 in [facility]  PHQ-2 Total Score 6 4 0 5 0  PHQ-9 Total Score 21 21 -- 22 --       Assessment and Plan: This patient is a 39 year old female with long-term dysthymia depression and numerous somatic complaints.  She is having significant fatigue.  We will therefore restart Adderall 10 mg twice daily to help with fatigue and focus.  She will continue Cymbalta 60 mg daily for treatment of depression chronic pain, lithium 600 mg of mood stabilization and Xanax 1 mg 4 times daily for anxiety.  She was warned not to use NSAIDs while being on lithium for her back pain.  She will return to see me in 4 weeks   [name], MD 05/28/2020, 11:18 AM

## 2020-06-08 ENCOUNTER — Inpatient Hospital Stay (HOSPITAL_COMMUNITY): Payer: Medicare Other

## 2020-06-09 ENCOUNTER — Other Ambulatory Visit: Payer: Self-pay

## 2020-06-09 ENCOUNTER — Inpatient Hospital Stay (HOSPITAL_COMMUNITY): Payer: Medicare Other | Attending: Hematology

## 2020-06-09 DIAGNOSIS — E538 Deficiency of other specified B group vitamins: Secondary | ICD-10-CM | POA: Diagnosis not present

## 2020-06-09 DIAGNOSIS — F17210 Nicotine dependence, cigarettes, uncomplicated: Secondary | ICD-10-CM | POA: Diagnosis not present

## 2020-06-09 DIAGNOSIS — D72829 Elevated white blood cell count, unspecified: Secondary | ICD-10-CM | POA: Insufficient documentation

## 2020-06-09 DIAGNOSIS — E079 Disorder of thyroid, unspecified: Secondary | ICD-10-CM | POA: Diagnosis not present

## 2020-06-09 LAB — COMPREHENSIVE METABOLIC PANEL
ALT: 19 U/L (ref 0–44)
AST: 15 U/L (ref 15–41)
Albumin: 3.6 g/dL (ref 3.5–5.0)
Alkaline Phosphatase: 118 U/L (ref 38–126)
Anion gap: 11 (ref 5–15)
BUN: 6 mg/dL (ref 6–20)
CO2: 24 mmol/L (ref 22–32)
Calcium: 9.2 mg/dL (ref 8.9–10.3)
Chloride: 105 mmol/L (ref 98–111)
Creatinine, Ser: 0.85 mg/dL (ref 0.44–1.00)
GFR calc Af Amer: >60 mL/min (ref >60)
GFR calc non Af Amer: >60 mL/min (ref >60)
Glucose, Bld: 100 mg/dL — ABNORMAL HIGH (ref 70–99)
Potassium: 3.9 mmol/L (ref 3.5–5.1)
Sodium: 140 mmol/L (ref 135–145)
Total Bilirubin: 0.4 mg/dL (ref 0.3–1.2)
Total Protein: 6.8 g/dL (ref 6.5–8.1)

## 2020-06-09 LAB — CBC WITH DIFFERENTIAL/PLATELET
Abs Immature Granulocytes: 0.08 10*3/uL — ABNORMAL HIGH (ref 0.00–0.07)
Basophils Absolute: 0.1 10*3/uL (ref 0.0–0.1)
Basophils Relative: 1 %
Eosinophils Absolute: 0.1 10*3/uL (ref 0.0–0.5)
Eosinophils Relative: 1 %
HCT: 42.2 % (ref 36.0–46.0)
Hemoglobin: 14 g/dL (ref 12.0–15.0)
Immature Granulocytes: 0 %
Lymphocytes Relative: 19 %
Lymphs Abs: 3.5 10*3/uL (ref 0.7–4.0)
MCH: 29.7 pg (ref 26.0–34.0)
MCHC: 33.2 g/dL (ref 30.0–36.0)
MCV: 89.4 fL (ref 80.0–100.0)
Monocytes Absolute: 0.8 10*3/uL (ref 0.1–1.0)
Monocytes Relative: 4 %
Neutro Abs: 14.1 10*3/uL — ABNORMAL HIGH (ref 1.7–7.7)
Neutrophils Relative %: 75 %
Platelets: 404 10*3/uL — ABNORMAL HIGH (ref 150–400)
RBC: 4.72 MIL/uL (ref 3.87–5.11)
RDW: 12.4 % (ref 11.5–15.5)
WBC: 18.6 10*3/uL — ABNORMAL HIGH (ref 4.0–10.5)
nRBC: 0 % (ref 0.0–0.2)

## 2020-06-09 LAB — LACTATE DEHYDROGENASE: LDH: 98 U/L (ref 98–192)

## 2020-06-14 ENCOUNTER — Encounter (HOSPITAL_COMMUNITY): Payer: Self-pay | Admitting: Hematology

## 2020-06-14 ENCOUNTER — Other Ambulatory Visit: Payer: Self-pay

## 2020-06-14 ENCOUNTER — Inpatient Hospital Stay (HOSPITAL_COMMUNITY): Payer: Medicare Other | Admitting: Hematology

## 2020-06-14 VITALS — BP 121/82 | HR 83 | Temp 97.1°F | Resp 18 | Wt 186.4 lb

## 2020-06-14 DIAGNOSIS — D729 Disorder of white blood cells, unspecified: Secondary | ICD-10-CM | POA: Diagnosis not present

## 2020-06-14 DIAGNOSIS — E079 Disorder of thyroid, unspecified: Secondary | ICD-10-CM | POA: Diagnosis not present

## 2020-06-14 DIAGNOSIS — D72829 Elevated white blood cell count, unspecified: Secondary | ICD-10-CM | POA: Diagnosis not present

## 2020-06-14 DIAGNOSIS — E538 Deficiency of other specified B group vitamins: Secondary | ICD-10-CM | POA: Diagnosis not present

## 2020-06-14 DIAGNOSIS — F17210 Nicotine dependence, cigarettes, uncomplicated: Secondary | ICD-10-CM | POA: Diagnosis not present

## 2020-06-14 NOTE — Patient Instructions (Signed)
[facility] Discharge Instructions  You were seen today by Dr. Katragadda. He went over your recent results. Dr. Katragadda will see you back in 3 months for labs and follow up.   Thank you for choosing [facility] to provide your oncology and hematology care.  To afford each patient quality time with our provider, please arrive at least 15 minutes before your scheduled appointment time.   If you have a lab appointment with the Cancer Center please come in thru the Main Entrance and check in at the main information desk  You need to re-schedule your appointment should you arrive 10 or more minutes late.  We strive to give you quality time with our providers, and arriving late affects you and other patients whose appointments are after yours.  Also, if you no show three or more times for appointments you may be dismissed from the clinic at the providers discretion.     Again, thank you for choosing [facility].  Our hope is that these requests will decrease the amount of time that you wait before being seen by our physicians.       _____________________________________________________________  Should you have questions after your visit to  Cancer Center, please contact our office at [phone] between the hours of 8:00 a.m. and 4:30 p.m.  Voicemails left after 4:00 p.m. will not be returned until the following business day.  For prescription refill requests, have your pharmacy contact our office and allow 72 hours.    Cancer Center Support Programs:   > Cancer Support Group  2nd Tuesday of the month 1pm-2pm, Journey Room   

## 2020-06-14 NOTE — Progress Notes (Signed)
[place], [place] [address]   CLINIC:  Medical Oncology/Hematology  PCP:  Eunice Blase, MD [address] [place] [address]  [phone]  REASON FOR VISIT:  Follow-up for neutrophilic leukocytosis  PRIOR THERAPY: None  CURRENT THERAPY: Observation  INTERVAL HISTORY:  [name], a 39 y.o. female, returns for routine follow-up for her neutrophilic leukocytosis. Destiny Orr was last seen on 06/11/2019.  Today she reports being more tired and fatigued since a year ago. She denies having any recent steroid use, recent infections, F/C or rashes, though she gets night sweats with her shirt getting drenched. She has numbness in her hands which is stable. She has joint pains in several joints, including neck, left shoulder, right hip, and both big toes.  She continues smoking 3/4 PPD. Her uncle and cousin have rheumatoid arthritis.   REVIEW OF SYSTEMS:  Review of Systems  Constitutional: Positive for appetite change (moderately decreased), diaphoresis and fatigue (depleted). Negative for chills and fever.  Respiratory: Positive for shortness of breath.   Cardiovascular: Positive for palpitations.  Gastrointestinal: Positive for diarrhea and nausea.  Musculoskeletal: Positive for arthralgias (3/10 legs and R hip pain).  Skin: Negative for rash.  Neurological: Positive for dizziness, headaches and numbness (hands; stable).  Psychiatric/Behavioral: Positive for sleep disturbance.  All other systems reviewed and are negative.   PAST MEDICAL/SURGICAL HISTORY:  Past Medical History:  Diagnosis Date  . Abnormal Papanicolaou smear of cervix with positive human papilloma virus (HPV) test 10/11/2018   LSIL +HPV, get colpo___  . Abnormal uterine bleeding (AUB) 12/09/2014  . Anxiety   . Arthritis   . Depression   . DJD (degenerative joint disease)   . Dumping syndrome   . Dysmenorrhea 07/15/2014  . Fatigue 12/24/2012  . Fibromyalgia diagnosed  April 2016  . Headache   . Hx of migraine headaches 12/24/2012  . Inappropriate sinus tachycardia   . Irregular menstrual bleeding 07/15/2014  . Pelvic pain in female 03/03/2016  . Pneumothorax, spontaneous, tension   . Post concussion syndrome   . POTS (postural orthostatic tachycardia syndrome)   . PTSD (post-traumatic stress disorder)   . Scoliosis   . Thyroid disease   . Tick bite 03/03/2016  . Vitamin B 12 deficiency    Past Surgical History:  Procedure Laterality Date  . bone spurs toes Right   . CESAREAN SECTION    . COLONOSCOPY    . endooscopy    . KNEE SURGERY    . rotate cuff lt arm      SOCIAL HISTORY:  Social History   Socioeconomic History  . Marital status: Legally Separated    Spouse name: Not on file  . Number of children: 3  . Years of education: Not on file  . Highest education level: Not on file  Occupational History  . Not on file  Tobacco Use  . Smoking status: Current Every Day Smoker    Packs/day: 1.00    Years: 15.00    Pack years: 15.00    Types: Cigarettes    Start date: 03/13/1997  . Smokeless tobacco: Never Used  . Tobacco comment: "working on it" Was smoking 2.5 packs a day  Vaping Use  . Vaping Use: Never used  Substance and Sexual Activity  . Alcohol use: No  . Drug use: No  . Sexual activity: Yes    Partners: Male    Birth control/protection: None  Other Topics Concern  . Not on file  Social History Narrative  . Not on file   Social Determinants of Health   Financial Resource Strain:   . Difficulty of Paying Living Expenses: Not on file  Food Insecurity:   . Worried About Charity fundraiser in the Last Year: Not on file  . Ran Out of Food in the Last Year: Not on file  Transportation Needs:   . Lack of Transportation (Medical): Not on file  . Lack of Transportation (Non-Medical): Not on file  Physical Activity:   . Days of Exercise per Week: Not on file  . Minutes of Exercise per Session: Not on file  Stress:   .  Feeling of Stress : Not on file  Social Connections:   . Frequency of Communication with Friends and Family: Not on file  . Frequency of Social Gatherings with Friends and Family: Not on file  . Attends Religious Services: Not on file  . Active Member of Clubs or Organizations: Not on file  . Attends Archivist Meetings: Not on file  . Marital Status: Not on file  Intimate Partner Violence:   . Fear of Current or Ex-Partner: Not on file  . Emotionally Abused: Not on file  . Physically Abused: Not on file  . Sexually Abused: Not on file    FAMILY HISTORY:  Family History  Problem Relation Age of Onset  . Hypertension Father   . Atrial fibrillation Father   . Alcohol abuse Father   . Cancer Maternal Grandmother        skin   . Heart disease Maternal Grandmother   . Other Maternal Grandmother        had thyroid removed  . Breast cancer Maternal Grandmother   . Heart disease Paternal Grandfather   . COPD Paternal Grandfather   . Hypertension Paternal Grandfather   . Diabetes Paternal Grandfather   . Stroke Paternal Grandfather   . Cancer Maternal Grandfather        bladder,lung  . Anxiety disorder Maternal Aunt   . Anxiety disorder Maternal Uncle   . Bipolar disorder Maternal Uncle   . Breast cancer Maternal Uncle        CML  . Leukemia Other   . Colon cancer Other        lung-2 maternal great uncles    CURRENT MEDICATIONS:  Current Outpatient Medications  Medication Sig Dispense Refill  . ALPRAZolam (XANAX) 1 MG tablet Take 1 tablet (1 mg total) by mouth 4 (four) times daily. 120 tablet 2  . amphetamine-dextroamphetamine (ADDERALL) 10 MG tablet Take 1 tablet (10 mg total) by mouth 2 (two) times daily with a meal. 60 tablet 0  . Cholecalciferol (VITAMIN D3) 250 MCG (10000 UT) capsule Take 10,000 Units by mouth daily.    . Cyanocobalamin (B-12 PO) Take 1 tablet by mouth daily.    . cyclobenzaprine (FLEXERIL) 5 MG tablet Take 5 mg by mouth 3 (three) times daily  as needed.    . desogestrel-ethinyl estradiol (MIRCETTE) 0.15-0.02/0.01 MG (21/5) tablet TAKE (1) TABLET BY MOUTH ONCE DAILY. 28 tablet 12  . diltiazem (CARDIZEM) 60 MG tablet Take 60 mg as needed daily for palpitations 90 tablet 1  . DULoxetine (CYMBALTA) 60 MG capsule Take 1 capsule (60 mg total) by mouth daily. 30 capsule 2  . furosemide (LASIX) 20 MG tablet 1 PO q AM and q noon prn edema 60 tablet 1  . gabapentin (NEURONTIN) 300 MG capsule 1 PO q HS, may increase to 1 PO  TID if tolerated 90 capsule 5  . ibuprofen (ADVIL,MOTRIN) 200 MG tablet Take 400-600 mg by mouth every 6 (six) hours as needed for mild pain or moderate pain.    Marland Kitchen levothyroxine (SYNTHROID) 50 MCG tablet TAKE 1 TABLET BY MOUTH ONCE A DAY. 30 tablet 3  . lithium 300 MG tablet Take 2 tablets (600 mg total) by mouth at bedtime. 60 tablet 2  . PARoxetine (PAXIL) 20 MG tablet Take 20 mg by mouth daily.    . potassium chloride (KLOR-CON) 10 MEQ tablet 1 PO BID when taking lasix 60 tablet 1   No current facility-administered medications for this visit.    ALLERGIES:  Allergies  Allergen Reactions  . Prozac [Fluoxetine]     Suicidal thoughts  . Topiramate Other (See Comments)    Topamax-dizziness  . Lexapro [Escitalopram Oxalate] Other (See Comments)    Flat affect, No emotions    PHYSICAL EXAM:  Performance status (ECOG): 1 - Symptomatic but completely ambulatory  Vitals:   06/14/20 1422  BP: 121/82  Pulse: 83  Resp: 18  Temp: (!) 97.1 F (36.2 C)  SpO2: 96%   Wt Readings from Last 3 Encounters:  06/14/20 186 lb 6.4 oz (84.6 kg)  03/02/20 203 lb (92.1 kg)  06/11/19 192 lb (87.1 kg)   Physical Exam Vitals reviewed.  Constitutional:      Appearance: Normal appearance. She is obese.  Cardiovascular:     Rate and Rhythm: Normal rate and regular rhythm.     Pulses: Normal pulses.     Heart sounds: Normal heart sounds.  Pulmonary:     Effort: Pulmonary effort is normal.     Breath sounds: Normal breath  sounds.  Abdominal:     Palpations: Abdomen is soft. There is no hepatomegaly, splenomegaly or mass.     Tenderness: There is no abdominal tenderness.     Hernia: No hernia is present.  Lymphadenopathy:     Cervical: No cervical adenopathy.     Upper Body:     Right upper body: No supraclavicular, axillary or pectoral adenopathy.     Left upper body: No supraclavicular, axillary or pectoral adenopathy.  Neurological:     General: No focal deficit present.     Mental Status: She is alert and oriented to person, place, and time.  Psychiatric:        Mood and Affect: Mood normal.        Behavior: Behavior normal.     LABORATORY DATA:  I have reviewed the labs as listed.  CBC Latest Ref Rng & Units 06/09/2020 03/23/2020 05/30/2019  WBC 4.0 - 10.5 K/uL 18.6(H) 13.2(H) 11.6(H)  Hemoglobin 12.0 - 15.0 g/dL 14.0 13.4 14.1  Hematocrit 36 - 46 % 42.2 40.4 42.3  Platelets 150 - 400 K/uL 404(H) 350 327   CMP Latest Ref Rng & Units 06/09/2020 03/23/2020 05/30/2019  Glucose 70 - 99 mg/dL 100(H) 91 117(H)  BUN 6 - 20 mg/dL 6 9 10   Creatinine 0.44 - 1.00 mg/dL 0.85 0.84 0.82  Sodium 135 - 145 mmol/L 140 139 140  Potassium 3.5 - 5.1 mmol/L 3.9 4.5 4.3  Chloride 98 - 111 mmol/L 105 104 108  CO2 22 - 32 mmol/L 24 21 22   Calcium 8.9 - 10.3 mg/dL 9.2 9.8 9.1  Total Protein 6.5 - 8.1 g/dL 6.8 6.7 7.3  Total Bilirubin 0.3 - 1.2 mg/dL 0.4 0.2 0.2(L)  Alkaline Phos 38 - 126 U/L 118 - 99  AST 15 - 41 U/L  15 13 21   ALT 0 - 44 U/L 19 12 22       Component Value Date/Time   RBC 4.72 06/09/2020 1208   MCV 89.4 06/09/2020 1208   MCV 91 10/08/2018 1427   MCH 29.7 06/09/2020 1208   MCHC 33.2 06/09/2020 1208   RDW 12.4 06/09/2020 1208   RDW 12.5 10/08/2018 1427   LYMPHSABS 3.5 06/09/2020 1208   LYMPHSABS 2.6 12/22/2015 1017   MONOABS 0.8 06/09/2020 1208   EOSABS 0.1 06/09/2020 1208   EOSABS 0.1 12/22/2015 1017   BASOSABS 0.1 06/09/2020 1208   BASOSABS 0.1 12/22/2015 1017   Lab Results  Component  Value Date   LDH 98 06/09/2020   LDH 127 05/30/2019    DIAGNOSTIC IMAGING:  I have independently reviewed the scans and discussed with the patient. No results found.   ASSESSMENT:  1.  Neutrophilic leukocytosis: - She had extensive work-up including negative BCR/ABL by FISH and Jak 2 V617F and reflex mutation testing. - She smokes 1 pack/day for 21 years. -She was thought to have smoking-related leukocytosis. -No systemic steroid use. -I have recommended bone marrow biopsy if there is any significant worsening of her white cell count.  2.  Health maintenance: -She had a mammogram in January 2020 which was BI-RADS Category 2.   PLAN:  1.  Neutrophilic leukocytosis: -Reviewed labs from 06/09/2020, white count increased to 18.6 with increased absolute neutrophil count. -She is continuing to smoke three fourths of a pack of cigarettes a day.  She has occasional night sweats. -She lost some weight recently but she was also taking Lasix for ankle swellings and lost 7 pounds within 2 to 3 days. -As her white count increased recently, I have recommended follow-up in 3 months.  If it continues to get worse, will consider bone marrow biopsy.    Orders placed this encounter:  Orders Placed This Encounter  Procedures  . CBC with Differential/Platelet  . Comprehensive metabolic panel  . Lactate dehydrogenase     Derek Jack, MD [place] [phone]   I, Milinda Antis, am acting as a scribe for Dr. Sanda Linger.  I, Derek Jack MD, have reviewed the above documentation for accuracy and completeness, and I agree with the above.

## 2020-06-16 ENCOUNTER — Other Ambulatory Visit: Payer: Self-pay | Admitting: Obstetrics & Gynecology

## 2020-06-18 ENCOUNTER — Telehealth (HOSPITAL_COMMUNITY): Payer: Medicare Other | Admitting: Psychiatry

## 2020-06-22 ENCOUNTER — Encounter (HOSPITAL_COMMUNITY): Payer: Self-pay

## 2020-06-24 ENCOUNTER — Other Ambulatory Visit: Payer: Self-pay

## 2020-06-24 ENCOUNTER — Encounter: Payer: Self-pay | Admitting: Cardiology

## 2020-06-24 ENCOUNTER — Telehealth (INDEPENDENT_AMBULATORY_CARE_PROVIDER_SITE_OTHER): Payer: Medicare Other | Admitting: Cardiology

## 2020-06-24 VITALS — BP 135/92 | HR 76 | Ht 64.5 in | Wt 184.0 lb

## 2020-06-24 DIAGNOSIS — R002 Palpitations: Secondary | ICD-10-CM

## 2020-06-24 DIAGNOSIS — I498 Other specified cardiac arrhythmias: Secondary | ICD-10-CM | POA: Diagnosis not present

## 2020-06-24 DIAGNOSIS — G90A Postural orthostatic tachycardia syndrome (POTS): Secondary | ICD-10-CM

## 2020-06-24 NOTE — Progress Notes (Signed)
Virtual Visit via Telephone Note   This visit type was conducted due to national recommendations for restrictions regarding the COVID-19 Pandemic (e.g. social distancing) in an effort to limit this patient's exposure and mitigate transmission in our community.  Due to her co-morbid illnesses, this patient is at least at moderate risk for complications without adequate follow up.  This format is felt to be most appropriate for this patient at this time.  The patient did not have access to video technology/had technical difficulties with video requiring transitioning to audio format only (telephone).  All issues noted in this document were discussed and addressed.  No physical exam could be performed with this format.  Please refer to the patient's chart for her  consent to telehealth for [facility].    Date:  06/24/2020   ID:  Destiny Orr, DOB 06-20-1981, MRN [id number] The patient was identified using 2 identifiers.  Patient Location: Home Provider Location: Office/Clinic  PCP:  Eunice Blase, MD  Cardiologist:  Carlyle Dolly, MD  Electrophysiologist:  None   Evaluation Performed:  Follow-Up Visit  Chief Complaint:  Follow up   History of Present Illness:    Destiny Orr is a 39 y.o. female with seen today for follow up of the following medical problems.   1. Palpitations/POTS/Autonomic dysfunction - several year history, previously evaluated by EP - prior monitors have not show arrhythmias, has had sinus tachycardia.  - significant side effects on beta blockers even low doses, has been tried on several in the past. Recent side effects on recent retry of lopressor 12.5mg , did not tolerate.  - notes mentios avoiding midodrine due to history of raynauds  - labile HR and bp's.  - sbp's 80-180s bps - HRs 60s-170s - taking dilitazem prn - staying well hydrated, gatorade zero - prior LE edema, had been on lasix and this resolved. Unclear etiology.    The patient does not have  symptoms concerning for COVID-19 infection (fever, chills, cough, or new shortness of breath).    Past Medical History:  Diagnosis Date  . Abnormal Papanicolaou smear of cervix with positive human papilloma virus (HPV) test 10/11/2018   LSIL +HPV, get colpo___  . Abnormal uterine bleeding (AUB) 12/09/2014  . Anxiety   . Arthritis   . Depression   . DJD (degenerative joint disease)   . Dumping syndrome   . Dysmenorrhea 07/15/2014  . Fatigue 12/24/2012  . Fibromyalgia diagnosed April 2016  . Headache   . Hx of migraine headaches 12/24/2012  . Inappropriate sinus tachycardia   . Irregular menstrual bleeding 07/15/2014  . Pelvic pain in female 03/03/2016  . Pneumothorax, spontaneous, tension   . Post concussion syndrome   . POTS (postural orthostatic tachycardia syndrome)   . PTSD (post-traumatic stress disorder)   . Scoliosis   . Thyroid disease   . Tick bite 03/03/2016  . Vitamin B 12 deficiency    Past Surgical History:  Procedure Laterality Date  . bone spurs toes Right   . CESAREAN SECTION    . COLONOSCOPY    . endooscopy    . KNEE SURGERY    . rotate cuff lt arm       No outpatient medications have been marked as taking for the 06/24/20 encounter (Telemedicine) with Arnoldo Lenis, MD.     Allergies:   Prozac [fluoxetine], Topiramate, and Lexapro [escitalopram oxalate]   Social History   Tobacco Use  . Smoking status: Current Every Day Smoker    Packs/day: 1.00  Years: 15.00    Pack years: 15.00    Types: Cigarettes    Start date: 03/13/1997  . Smokeless tobacco: Never Used  . Tobacco comment: "working on it" Was smoking 2.5 packs a day  Vaping Use  . Vaping Use: Never used  Substance Use Topics  . Alcohol use: No  . Drug use: No     Family Hx: The patient's family history includes Alcohol abuse in her father; Anxiety disorder in her maternal aunt and maternal uncle; Atrial fibrillation in her father; Bipolar disorder in her maternal uncle; Breast cancer  in her maternal grandmother and maternal uncle; COPD in her paternal grandfather; Cancer in her maternal grandfather and maternal grandmother; Colon cancer in an other family member; Diabetes in her paternal grandfather; Heart disease in her maternal grandmother and paternal grandfather; Hypertension in her father and paternal grandfather; Leukemia in an other family member; Other in her maternal grandmother; Stroke in her paternal grandfather.  ROS:   Please see the history of present illness.     All other systems reviewed and are negative.   Prior CV studies:   The following studies were reviewed today:  12/2015 echo Study Conclusions  - Left ventricle: The cavity size was normal. Wall thickness was  normal. Systolic function was normal. The estimated ejection  fraction was in the range of 60% to 65%. Wall motion was normal;  there were no regional wall motion abnormalities. Left  ventricular diastolic function parameters were normal.  12/2015 Holter monitor 48 hour Holter monitor reviewed. Sinus rhythm and sinus tachycardia noted. Heart rate ranged from 56 bpm up to 143 beats bpm with average heart rate 90 bpm. No sustained arrhythmias. Rare PACs and PVCs/fusion beat. No pauses.     Labs/Other Tests and Data Reviewed:    EKG:  No ECG reviewed.  Recent Labs: 10/24/2019: TSH 1.971 06/09/2020: ALT 19; BUN 6; Creatinine, Ser 0.85; Hemoglobin 14.0; Platelets 404; Potassium 3.9; Sodium 140   Recent Lipid Panel Lab Results  Component Value Date/Time   CHOL 171 10/08/2018 02:27 PM   TRIG 200 (H) 10/08/2018 02:27 PM   HDL 51 10/08/2018 02:27 PM   CHOLHDL 3.4 10/08/2018 02:27 PM   CHOLHDL 3.2 01/07/2013 09:49 AM   LDLCALC 80 10/08/2018 02:27 PM    Wt Readings from Last 3 Encounters:  06/24/20 184 lb (83.5 kg)  06/14/20 186 lb 6.4 oz (84.6 kg)  03/02/20 203 lb (92.1 kg)     Objective:    Vital Signs:  BP (!) 135/92   Pulse 76   Ht 5' 4.5" (1.638 m)   Wt 184 lb (83.5  kg)   BMI 31.10 kg/m    Normal affect. Normal speech pattern and tone. No audible signs of SOB or wheezing. Comfortable, no apparent distress.   ASSESSMENT & PLAN:    1. Palpitatoins/POTS/Autonomic dysfunction - ongoing symptoms despite aggressive hydration. Will reorder compression stockings - difficultly tolerating very low dose beta blockers, currently off. Has prn diltaizem - will try low dose florinef 0.1mg  daily to see how tolerated, accept high bp's at times if can help relieve POTs symptoms.   COVID-19 Education: The signs and symptoms of COVID-19 were discussed with the patient and how to seek care for testing (follow up with PCP or arrange E-visit).  The importance of social distancing was discussed today.  Time:   Today, I have spent 14 minutes with the patient with telehealth technology discussing the above problems.     Medication Adjustments/Labs and  Tests Ordered: Current medicines are reviewed at length with the patient today.  Concerns regarding medicines are outlined above.   Tests Ordered: No orders of the defined types were placed in this encounter.   Medication Changes: No orders of the defined types were placed in this encounter.   Follow Up:  Virtual Visit  in 6 week(s)  Signed, Carlyle Dolly, MD  06/24/2020 12:59 PM    [place]

## 2020-06-25 ENCOUNTER — Telehealth: Payer: Self-pay

## 2020-06-25 ENCOUNTER — Telehealth (INDEPENDENT_AMBULATORY_CARE_PROVIDER_SITE_OTHER): Payer: Self-pay | Admitting: Psychiatry

## 2020-06-25 DIAGNOSIS — N898 Other specified noninflammatory disorders of vagina: Secondary | ICD-10-CM

## 2020-06-25 MED ORDER — AMPHETAMINE-DEXTROAMPHETAMINE 10 MG PO TABS: 10.0000 mg | ORAL_TABLET | Freq: Three times a day (TID) | ORAL | 0 refills | Status: DC

## 2020-06-25 MED ORDER — FLUDROCORTISONE ACETATE 0.1 MG PO TABS: 0.1000 mg | ORAL_TABLET | Freq: Every day | ORAL | 6 refills | Status: DC

## 2020-06-25 NOTE — Telephone Encounter (Signed)
New message     *STAT* If patient is at the pharmacy, call can be transferred to refill team.   1. Which medications need to be refilled? (please list name of each medication and dose if known)  Will reorder compression stockings - difficultly tolerating very low dose beta blockers, currently off. - will try low dose florinef 0.1mg  daily to see how tolerated, accept high bp's at times if can help relieve POTs symptoms.  2. Which pharmacy/location (including street and city if local pharmacy) is medication to be sent to [place]   3. Do they need a 30 day or 90 day supply?  [place]

## 2020-06-25 NOTE — Patient Instructions (Signed)
Medication Instructions:  START Florinef 0.1 mg daily  *If you need a refill on your cardiac medications before your next appointment, please call your pharmacy*   Lab Work: None today If you have labs (blood work) drawn today and your tests are completely normal, you will receive your results only by: Marland Kitchen MyChart Message (if you have MyChart) OR . A paper copy in the mail If you have any lab test that is abnormal or we need to change your treatment, we will call you to review the results.   Testing/Procedures: None today   Follow-Up: At [facility], you and your health needs are our priority.  As part of our continuing mission to provide you with exceptional heart care, we have created designated Provider Care Teams.  These Care Teams include your primary Cardiologist (physician) and Advanced Practice Providers (APPs -  Physician Assistants and Nurse Practitioners) who all work together to provide you with the care you need, when you need it.  We recommend signing up for the patient portal called "MyChart".  Sign up information is provided on this After Visit Summary.  MyChart is used to connect with patients for Virtual Visits (Telemedicine).  Patients are able to view lab/test results, encounter notes, upcoming appointments, etc.  Non-urgent messages can be sent to your provider as well.   To learn more about what you can do with MyChart, go to [email or web address].    Your next appointment:   6 week(s)  The format for your next appointment:   Virtual Visit   Provider:   Carlyle Dolly, MD   Other Instructions We will mail your prescription for compression stockings to you.

## 2020-06-25 NOTE — Telephone Encounter (Signed)
Florinef rx sent to pharmacy now.

## 2020-07-01 ENCOUNTER — Telehealth (INDEPENDENT_AMBULATORY_CARE_PROVIDER_SITE_OTHER): Payer: Medicare Other | Admitting: Psychiatry

## 2020-07-01 ENCOUNTER — Encounter (HOSPITAL_COMMUNITY): Payer: Self-pay | Admitting: Psychiatry

## 2020-07-01 ENCOUNTER — Other Ambulatory Visit: Payer: Self-pay

## 2020-07-01 DIAGNOSIS — F332 Major depressive disorder, recurrent severe without psychotic features: Secondary | ICD-10-CM

## 2020-07-01 MED ORDER — LITHIUM CARBONATE 300 MG PO TABS
600.0000 mg | ORAL_TABLET | Freq: Every day | ORAL | 2 refills | Status: DC
Start: 2020-07-01 — End: 2020-07-29

## 2020-07-01 MED ORDER — AMPHETAMINE-DEXTROAMPHETAMINE 10 MG PO TABS: 10.0000 mg | ORAL_TABLET | Freq: Three times a day (TID) | ORAL | 0 refills | Status: DC

## 2020-07-01 MED ORDER — PAROXETINE HCL 20 MG PO TABS
20.0000 mg | ORAL_TABLET | Freq: Every day | ORAL | 2 refills | Status: DC
Start: 2020-07-01 — End: 2020-07-01

## 2020-07-01 MED ORDER — DULOXETINE HCL 60 MG PO CPEP: 60.0000 mg | ORAL_CAPSULE | Freq: Every day | ORAL | 2 refills | Status: DC

## 2020-07-01 MED ORDER — ALPRAZOLAM 1 MG PO TABS: 1.0000 mg | ORAL_TABLET | Freq: Four times a day (QID) | ORAL | 2 refills | Status: DC

## 2020-07-01 NOTE — Progress Notes (Signed)
Virtual Visit via Video Note  I connected with Destiny Orr on 07/01/20 at  1:20 PM EDT by a video enabled telemedicine application and verified that I am speaking with the correct person using two identifiers.   I discussed the limitations of evaluation and management by telemedicine and the availability of in person appointments. The patient expressed understanding and agreed to proceed.    I discussed the assessment and treatment plan with the patient. The patient was provided an opportunity to ask questions and all were answered. The patient agreed with the plan and demonstrated an understanding of the instructions.   The patient was advised to call back or seek an in-person evaluation if the symptoms worsen or if the condition fails to improve as anticipated.  I provided 15 minutes of non-face-to-face time during this encounter. Location: Provider office, patient home  Destiny Spiller, MD  [facility] MD/PA/NP OP Progress Note  07/01/2020 1:38 PM Destiny Orr  MRN:  [id number]  Chief Complaint: depression HPI: This patient is a 39 year old separated white female who lives with her parents 2 daughters and 1 son in [place].  She is on disability from [employer] and gets Social Security disability.  The patient returns for follow-up after 4 weeks.  Last time she had severe fatigue and I did increase her Adderall to 10 mg 3 times daily.  She states that this is helping but she is still not feeling well.  Her cardiologist started her on Florinef because of her POTS.  After 3 days of taking this last week she became very nauseated.  She stopped it a few days ago but is still nauseated unable to eat has been vomiting and having some diarrhea.  She has not informed her cardiologist or her primary doctor about this and I urged her to do so as soon as possible.  There is been no other changes to her medication regimen.  I encouraged her to try to eat a little bit.  She states that her mood is okay but she  still very tired and does not feel well. Visit Diagnosis:    ICD-10-CM   1. Major depressive disorder, recurrent, severe without psychotic features ([place])  F33.2     Past Psychiatric History: Long-term outpatient treatment for depression and anxiety  Past Medical History:  Past Medical History:  Diagnosis Date  . Abnormal Papanicolaou smear of cervix with positive human papilloma virus (HPV) test 10/11/2018   LSIL +HPV, get colpo___  . Abnormal uterine bleeding (AUB) 12/09/2014  . Anxiety   . Arthritis   . Depression   . DJD (degenerative joint disease)   . Dumping syndrome   . Dysmenorrhea 07/15/2014  . Fatigue 12/24/2012  . Fibromyalgia diagnosed April 2016  . Headache   . Hx of migraine headaches 12/24/2012  . Inappropriate sinus tachycardia   . Irregular menstrual bleeding 07/15/2014  . Pelvic pain in female 03/03/2016  . Pneumothorax, spontaneous, tension   . Post concussion syndrome   . POTS (postural orthostatic tachycardia syndrome)   . PTSD (post-traumatic stress disorder)   . Scoliosis   . Thyroid disease   . Tick bite 03/03/2016  . Vitamin B 12 deficiency     Past Surgical History:  Procedure Laterality Date  . bone spurs toes Right   . CESAREAN SECTION    . COLONOSCOPY    . endooscopy    . KNEE SURGERY    . rotate cuff lt arm      Family Psychiatric History:  see below   Family History:  Family History  Problem Relation Age of Onset  . Hypertension Father   . Atrial fibrillation Father   . Alcohol abuse Father   . Cancer Maternal Grandmother        skin   . Heart disease Maternal Grandmother   . Other Maternal Grandmother        had thyroid removed  . Breast cancer Maternal Grandmother   . Heart disease Paternal Grandfather   . COPD Paternal Grandfather   . Hypertension Paternal Grandfather   . Diabetes Paternal Grandfather   . Stroke Paternal Grandfather   . Cancer Maternal Grandfather        bladder,lung  . Anxiety disorder Maternal Aunt   .  Anxiety disorder Maternal Uncle   . Bipolar disorder Maternal Uncle   . Breast cancer Maternal Uncle        CML  . Leukemia Other   . Colon cancer Other        lung-2 maternal great uncles    Social History:  Social History   Socioeconomic History  . Marital status: Legally Separated    Spouse name: Not on file  . Number of children: 3  . Years of education: Not on file  . Highest education level: Not on file  Occupational History  . Not on file  Tobacco Use  . Smoking status: Current Every Day Smoker    Packs/day: 1.00    Years: 15.00    Pack years: 15.00    Types: Cigarettes    Start date: 03/13/1997  . Smokeless tobacco: Never Used  . Tobacco comment: "working on it" Was smoking 2.5 packs a day  Vaping Use  . Vaping Use: Never used  Substance and Sexual Activity  . Alcohol use: No  . Drug use: No  . Sexual activity: Yes    Partners: Male    Birth control/protection: None  Other Topics Concern  . Not on file  Social History Narrative  . Not on file   Social Determinants of Health   Financial Resource Strain:   . Difficulty of Paying Living Expenses: Not on file  Food Insecurity:   . Worried About Charity fundraiser in the Last Year: Not on file  . Ran Out of Food in the Last Year: Not on file  Transportation Needs:   . Lack of Transportation (Medical): Not on file  . Lack of Transportation (Non-Medical): Not on file  Physical Activity:   . Days of Exercise per Week: Not on file  . Minutes of Exercise per Session: Not on file  Stress:   . Feeling of Stress : Not on file  Social Connections:   . Frequency of Communication with Friends and Family: Not on file  . Frequency of Social Gatherings with Friends and Family: Not on file  . Attends Religious Services: Not on file  . Active Member of Clubs or Organizations: Not on file  . Attends Archivist Meetings: Not on file  . Marital Status: Not on file    Allergies:  Allergies  Allergen  Reactions  . Prozac [Fluoxetine]     Suicidal thoughts  . Topiramate Other (See Comments)    Topamax-dizziness  . Lexapro [Escitalopram Oxalate] Other (See Comments)    Flat affect, No emotions    Metabolic Disorder Labs: Lab Results  Component Value Date   HGBA1C 5.4 01/07/2013   MPG 108 01/07/2013   Lab Results  Component Value Date  PROLACTIN 13.2 06/12/2016   Lab Results  Component Value Date   CHOL 171 10/08/2018   TRIG 200 (H) 10/08/2018   HDL 51 10/08/2018   CHOLHDL 3.4 10/08/2018   VLDL 31 01/07/2013   LDLCALC 80 10/08/2018   LDLCALC 90 01/07/2013   Lab Results  Component Value Date   TSH 1.971 10/24/2019   TSH 1.480 03/05/2019    Therapeutic Level Labs: Lab Results  Component Value Date   LITHIUM 0.45 (L) 01/27/2020   LITHIUM 0.24 (L) 09/03/2019   No results found for: VALPROATE No components found for:  CBMZ  Current Medications: Current Outpatient Medications  Medication Sig Dispense Refill  . ALPRAZolam (XANAX) 1 MG tablet Take 1 tablet (1 mg total) by mouth 4 (four) times daily. 120 tablet 2  . amphetamine-dextroamphetamine (ADDERALL) 10 MG tablet Take 1 tablet (10 mg total) by mouth 3 (three) times daily. 90 tablet 0  . Cholecalciferol (VITAMIN D3) 250 MCG (10000 UT) capsule Take 10,000 Units by mouth daily.    . Cyanocobalamin (B-12 PO) Take 1 tablet by mouth daily.    . cyclobenzaprine (FLEXERIL) 5 MG tablet Take 5 mg by mouth 3 (three) times daily as needed.    . desogestrel-ethinyl estradiol (MIRCETTE) 0.15-0.02/0.01 MG (21/5) tablet TAKE (1) TABLET BY MOUTH ONCE DAILY. 28 tablet 12  . diltiazem (CARDIZEM) 60 MG tablet Take 60 mg as needed daily for palpitations 90 tablet 1  . DULoxetine (CYMBALTA) 60 MG capsule Take 1 capsule (60 mg total) by mouth daily. 30 capsule 2  . fludrocortisone (FLORINEF) 0.1 MG tablet Take 1 tablet (0.1 mg total) by mouth daily. 30 tablet 6  . gabapentin (NEURONTIN) 300 MG capsule 1 PO q HS, may increase to 1 PO  TID if tolerated 90 capsule 5  . ibuprofen (ADVIL,MOTRIN) 200 MG tablet Take 400-600 mg by mouth every 6 (six) hours as needed for mild pain or moderate pain.    Marland Kitchen levothyroxine (SYNTHROID) 50 MCG tablet TAKE 1 TABLET BY MOUTH ONCE A DAY. 30 tablet 3  . lithium 300 MG tablet Take 2 tablets (600 mg total) by mouth at bedtime. 60 tablet 2  . PARoxetine (PAXIL) 20 MG tablet Take 1 tablet (20 mg total) by mouth daily. 30 tablet 2   No current facility-administered medications for this visit.     Musculoskeletal: Strength & Muscle Tone: within normal limits Gait & Station: normal Patient leans: N/A  Psychiatric Specialty Exam: Review of Systems  Constitutional: Positive for fatigue.  Gastrointestinal: Positive for abdominal pain, diarrhea and nausea.    There were no vitals taken for this visit.There is no height or weight on file to calculate BMI.  General Appearance: Casual and Fairly Groomed  Eye Contact:  Good  Speech:  Clear and Coherent  Volume:  Normal  Mood:  Dysphoric  Affect:  Flat  Thought Process:  Goal Directed  Orientation:  Full (Time, Place, and Person)  Thought Content: Rumination   Suicidal Thoughts:  No  Homicidal Thoughts:  No  Memory:  Immediate;   Good Recent;   Good Remote;   Fair  Judgement:  Good  Insight:  Fair  Psychomotor Activity:  Decreased  Concentration:  Concentration: Good and Attention Span: Good  Recall:  Good  Fund of Knowledge: Good  Language: Good  Akathisia:  No  Handed:  Right  AIMS (if indicated): not done  Assets:  Communication Skills Desire for Improvement Resilience Social Support Talents/Skills  ADL's:  Intact  Cognition: WNL  Sleep:  Good   Screenings: Mini-Mental     Office Visit from 10/25/2016 in [facility] [place]  Total Score (max 30 points ) 29    PHQ2-9     Office Visit from 10/08/2018 in [place] Visit from 08/21/2017 in [place] Office Visit from 07/18/2017 in [facility] Counselor from 07/10/2017 in [place] Office Visit from 07/03/2017 in [facility]  PHQ-2 Total Score 6 4 0 5 0  PHQ-9 Total Score 21 21 -- 22 --       Assessment and Plan: This patient is a 39 year old female with long-term dysthymia depression and numerous somatic complaints she was having significant fatigue and I have increased her Adderall which has helped to some degree.  For now she will continue Adderall 10 mg 3 times daily for fatigue and focus, Cymbalta 60 mg daily for depression and chronic pain, lithium 600 mg for mood stabilization and Xanax 1 mg 4 times daily for anxiety.  She will talk to her primary doctor about her symptoms.  She will return to see me in 4 weeks   Destiny Spiller, MD 07/01/2020, 1:38 PM

## 2020-07-05 NOTE — Progress Notes (Deleted)
Office Visit Note  Patient: Destiny Orr             Date of Birth: [birth year]           MRN: [id number]             PCP: Eunice Blase, MD Referring: Eunice Blase, MD Visit Date: 07/09/2020 Occupation: @GUAROCC @  Subjective:  No chief complaint on file.   History of Present Illness: Destiny Orr is a 39 y.o. female ***   Activities of Daily Living:  Patient reports morning stiffness for *** {minute/hour:19697}.   Patient {ACTIONS;DENIES/REPORTS:21021675::"Denies"} nocturnal pain.  Difficulty dressing/grooming: {ACTIONS;DENIES/REPORTS:21021675::"Denies"} Difficulty climbing stairs: {ACTIONS;DENIES/REPORTS:21021675::"Denies"} Difficulty getting out of chair: {ACTIONS;DENIES/REPORTS:21021675::"Denies"} Difficulty using hands for taps, buttons, cutlery, and/or writing: {ACTIONS;DENIES/REPORTS:21021675::"Denies"}  No Rheumatology ROS completed.   PMFS History:  Patient Active Problem List   Diagnosis Date Noted  . Vitamin D deficiency 01/20/2020  . Hypothyroidism 11/15/2018  . Abnormal Papanicolaou smear of cervix with positive human papilloma virus (HPV) test 10/11/2018  . Goiter 10/08/2018  . Breast tenderness 10/08/2018  . Mass of upper outer quadrant of right breast 10/08/2018  . Encounter for gynecological examination with Papanicolaou smear of cervix 10/08/2018  . Current smoker 07/03/2017  . Leukocytosis 06/18/2017  . Thyroid nodule 12/26/2016  . Neck injury, initial encounter 10/20/2016  . Concussion with loss of consciousness 10/20/2016  . Injury of left shoulder 10/20/2016  . Sinus tachycardia 10/16/2016  . Post concussion syndrome 10/03/2016  . Intractable headache 10/03/2016  . Dizziness 10/03/2016  . Blurry vision, right eye 10/03/2016  . Nodular goiter 06/18/2016  . Fibromyalgia syndrome 06/16/2016  . Raynaud's syndrome without gangrene 06/16/2016  . Numbness and tingling sensation of skin 06/16/2016  . B12 deficiency 06/16/2016  . Dizziness and giddiness  06/16/2016  . Pelvic pain in female 03/03/2016  . Tick bite 03/03/2016  . POTS (postural orthostatic tachycardia syndrome) 02/05/2016  . Abnormal uterine bleeding (AUB) 12/09/2014  . Depression 11/26/2014  . Dysmenorrhea 07/15/2014  . Irregular menstrual bleeding 07/15/2014  . Back pain, chronic 04/02/2013  . Chronic fatigue and malaise 12/24/2012  . Anxiety 12/24/2012  . PTSD (post-traumatic stress disorder) 12/24/2012  . ADD (attention deficit disorder) 12/24/2012  . Contraception 12/24/2012  . Hx of migraine headaches 12/24/2012    Past Medical History:  Diagnosis Date  . Abnormal Papanicolaou smear of cervix with positive human papilloma virus (HPV) test 10/11/2018   LSIL +HPV, get colpo___  . Abnormal uterine bleeding (AUB) 12/09/2014  . Anxiety   . Arthritis   . Depression   . DJD (degenerative joint disease)   . Dumping syndrome   . Dysmenorrhea 07/15/2014  . Fatigue 12/24/2012  . Fibromyalgia diagnosed April 2016  . Headache   . Hx of migraine headaches 12/24/2012  . Inappropriate sinus tachycardia   . Irregular menstrual bleeding 07/15/2014  . Pelvic pain in female 03/03/2016  . Pneumothorax, spontaneous, tension   . Post concussion syndrome   . POTS (postural orthostatic tachycardia syndrome)   . PTSD (post-traumatic stress disorder)   . Scoliosis   . Thyroid disease   . Tick bite 03/03/2016  . Vitamin B 12 deficiency     Family History  Problem Relation Age of Onset  . Hypertension Father   . Atrial fibrillation Father   . Alcohol abuse Father   . Cancer Maternal Grandmother        skin   . Heart disease Maternal Grandmother   . Other Maternal Grandmother  had thyroid removed  . Breast cancer Maternal Grandmother   . Heart disease Paternal Grandfather   . COPD Paternal Grandfather   . Hypertension Paternal Grandfather   . Diabetes Paternal Grandfather   . Stroke Paternal Grandfather   . Cancer Maternal Grandfather        bladder,lung  . Anxiety  disorder Maternal Aunt   . Anxiety disorder Maternal Uncle   . Bipolar disorder Maternal Uncle   . Breast cancer Maternal Uncle        CML  . Leukemia Other   . Colon cancer Other        lung-2 maternal great uncles   Past Surgical History:  Procedure Laterality Date  . bone spurs toes Right   . CESAREAN SECTION    . COLONOSCOPY    . endooscopy    . KNEE SURGERY    . rotate cuff lt arm     Social History   Social History Narrative  . Not on file   Immunization History  Administered Date(s) Administered  . Tdap 02/09/2014     Objective: Vital Signs: There were no vitals taken for this visit.   Physical Exam   Musculoskeletal Exam: ***  CDAI Exam: CDAI Score: -- Patient Global: --; Provider Global: -- Swollen: --; Tender: -- Joint Exam 07/09/2020   No joint exam has been documented for this visit   There is currently no information documented on the homunculus. Go to the Rheumatology activity and complete the homunculus joint exam.  Investigation: No additional findings.  Imaging: No results found.  Recent Labs: Lab Results  Component Value Date   WBC 18.6 (H) 06/09/2020   HGB 14.0 06/09/2020   PLT 404 (H) 06/09/2020   NA 140 06/09/2020   K 3.9 06/09/2020   CL 105 06/09/2020   CO2 24 06/09/2020   GLUCOSE 100 (H) 06/09/2020   BUN 6 06/09/2020   CREATININE 0.85 06/09/2020   BILITOT 0.4 06/09/2020   ALKPHOS 118 06/09/2020   AST 15 06/09/2020   ALT 19 06/09/2020   PROT 6.8 06/09/2020   ALBUMIN 3.6 06/09/2020   CALCIUM 9.2 06/09/2020   GFRAA >60 06/09/2020    Speciality Comments: No specialty comments available.  Procedures:  No procedures performed Allergies: Prozac [fluoxetine], Topiramate, and Lexapro [escitalopram oxalate]   Assessment / Plan:     Visit Diagnoses: No diagnosis found.  Orders: No orders of the defined types were placed in this encounter.  No orders of the defined types were placed in this encounter.   Face-to-face  time spent with patient was *** minutes. Greater than 50% of time was spent in counseling and coordination of care.  Follow-Up Instructions: No follow-ups on file.   Ofilia Neas, PA-C  Note - This record has been created using Dragon software.  Chart creation errors have been sought, but may not always  have been located. Such creation errors do not reflect on  the standard of medical care.

## 2020-07-09 ENCOUNTER — Ambulatory Visit: Payer: Medicare Other | Admitting: Rheumatology

## 2020-07-12 ENCOUNTER — Other Ambulatory Visit: Payer: Medicare Other | Admitting: Obstetrics & Gynecology

## 2020-07-15 ENCOUNTER — Other Ambulatory Visit (HOSPITAL_COMMUNITY): Payer: Self-pay | Admitting: *Deleted

## 2020-07-15 DIAGNOSIS — D729 Disorder of white blood cells, unspecified: Secondary | ICD-10-CM

## 2020-07-15 NOTE — Progress Notes (Signed)
Orders placed for CT bone marrow biopsy per Dr. Delton Coombes.  Patient aware of process for scheduling appt for biopsy.

## 2020-07-23 ENCOUNTER — Other Ambulatory Visit (HOSPITAL_COMMUNITY): Payer: Self-pay | Admitting: Psychiatry

## 2020-07-23 ENCOUNTER — Telehealth (HOSPITAL_COMMUNITY): Payer: Self-pay | Admitting: *Deleted

## 2020-07-23 MED ORDER — AMPHETAMINE-DEXTROAMPHETAMINE 10 MG PO TABS: 10.0000 mg | ORAL_TABLET | Freq: Three times a day (TID) | ORAL | 0 refills | Status: DC

## 2020-07-23 NOTE — Telephone Encounter (Signed)
Patient called stating she is needing refills for her Adderall. Per pt her next appt with provider is 07-29-2020

## 2020-07-23 NOTE — Telephone Encounter (Signed)
sent 

## 2020-07-23 NOTE — Telephone Encounter (Signed)
Informed patient and she verbalized understanding.  

## 2020-07-29 ENCOUNTER — Other Ambulatory Visit: Payer: Self-pay | Admitting: Student

## 2020-07-29 ENCOUNTER — Other Ambulatory Visit: Payer: Self-pay

## 2020-07-29 ENCOUNTER — Other Ambulatory Visit: Payer: Self-pay | Admitting: Radiology

## 2020-07-29 ENCOUNTER — Telehealth (INDEPENDENT_AMBULATORY_CARE_PROVIDER_SITE_OTHER): Payer: Medicare Other | Admitting: Psychiatry

## 2020-07-29 ENCOUNTER — Encounter (HOSPITAL_COMMUNITY): Payer: Self-pay | Admitting: Psychiatry

## 2020-07-29 DIAGNOSIS — F4310 Post-traumatic stress disorder, unspecified: Secondary | ICD-10-CM

## 2020-07-29 DIAGNOSIS — F332 Major depressive disorder, recurrent severe without psychotic features: Secondary | ICD-10-CM | POA: Diagnosis not present

## 2020-07-29 MED ORDER — DULOXETINE HCL 60 MG PO CPEP: 60.0000 mg | ORAL_CAPSULE | Freq: Every day | ORAL | 2 refills | Status: DC

## 2020-07-29 MED ORDER — DULOXETINE HCL 30 MG PO CPEP: 30.0000 mg | ORAL_CAPSULE | Freq: Every day | ORAL | 2 refills | Status: DC

## 2020-07-29 MED ORDER — ALPRAZOLAM 1 MG PO TABS: 1.0000 mg | ORAL_TABLET | Freq: Four times a day (QID) | ORAL | 2 refills | Status: DC

## 2020-07-29 MED ORDER — LITHIUM CARBONATE 300 MG PO TABS
600.0000 mg | ORAL_TABLET | Freq: Every day | ORAL | 2 refills | Status: DC
Start: 2020-07-29 — End: 2020-08-24

## 2020-07-29 MED ORDER — AMPHETAMINE-DEXTROAMPHETAMINE 10 MG PO TABS: 10.0000 mg | ORAL_TABLET | Freq: Three times a day (TID) | ORAL | 0 refills | Status: DC

## 2020-07-29 NOTE — Progress Notes (Signed)
Virtual Visit via Video Note  I connected with Destiny Orr on 07/29/20 at 10:20 AM EDT by a video enabled telemedicine application and verified that I am speaking with the correct person using two identifiers.  Location: Patient: home Provider: office   I discussed the limitations of evaluation and management by telemedicine and the availability of in person appointments. The patient expressed understanding and agreed to proceed.    I discussed the assessment and treatment plan with the patient. The patient was provided an opportunity to ask questions and all were answered. The patient agreed with the plan and demonstrated an understanding of the instructions.   The patient was advised to call back or seek an in-person evaluation if the symptoms worsen or if the condition fails to improve as anticipated.  I provided 15 minutes of non-face-to-face time during this encounter.   [name], MD  [facility] MD/PA/NP OP Progress Note  07/29/2020 10:38 AM Destiny Orr  MRN:  [id number]  Chief Complaint:  Chief Complaint    Depression; Anxiety; Fatigue; Follow-up     HPI: This patient is a 39 year old separated white female who lives with her parents 2 daughters and 1 son in [place].  She is on disability from [employer] and get Social Security disability.  The patient returns for follow-up after 4 weeks.  Last time she had severe fatigue and I increased her Adderall to 10 mg 3 times daily.  She is no longer sleeping so much during the day.  She was started on Florinef but because of her POTS but it made her nauseated and she had to stop.  Today she seems more depressed and down.  She is not sure why.  She just does not feel like doing anything.  She is having a bone marrow biopsy tomorrow and she is a bit nervous about it.  This is because her neutrophil count has been running high.  She denies thoughts of suicide or self-harm but just has no motivation.  She had to put off her rheumatology  appointment will be seen till February.  I suggested we go up a little bit more on the Cymbalta Visit Diagnosis:    ICD-10-CM   1. Major depressive disorder, recurrent, severe without psychotic features ([place])  F33.2   2. PTSD (post-traumatic stress disorder)  F43.10     Past Psychiatric History: Long-term outpatient treatment for depression and anxiety  Past Medical History:  Past Medical History:  Diagnosis Date  . Abnormal Papanicolaou smear of cervix with positive human papilloma virus (HPV) test 10/11/2018   LSIL +HPV, get colpo___  . Abnormal uterine bleeding (AUB) 12/09/2014  . Anxiety   . Arthritis   . Depression   . DJD (degenerative joint disease)   . Dumping syndrome   . Dysmenorrhea 07/15/2014  . Fatigue 12/24/2012  . Fibromyalgia diagnosed April 2016  . Headache   . Hx of migraine headaches 12/24/2012  . Inappropriate sinus tachycardia   . Irregular menstrual bleeding 07/15/2014  . Pelvic pain in female 03/03/2016  . Pneumothorax, spontaneous, tension   . Post concussion syndrome   . POTS (postural orthostatic tachycardia syndrome)   . PTSD (post-traumatic stress disorder)   . Scoliosis   . Thyroid disease   . Tick bite 03/03/2016  . Vitamin B 12 deficiency     Past Surgical History:  Procedure Laterality Date  . bone spurs toes Right   . CESAREAN SECTION    . COLONOSCOPY    . endooscopy    .  KNEE SURGERY    . rotate cuff lt arm      Family Psychiatric History: see below  Family History:  Family History  Problem Relation Age of Onset  . Hypertension Father   . Atrial fibrillation Father   . Alcohol abuse Father   . Cancer Maternal Grandmother        skin   . Heart disease Maternal Grandmother   . Other Maternal Grandmother        had thyroid removed  . Breast cancer Maternal Grandmother   . Heart disease Paternal Grandfather   . COPD Paternal Grandfather   . Hypertension Paternal Grandfather   . Diabetes Paternal Grandfather   . Stroke Paternal  Grandfather   . Cancer Maternal Grandfather        bladder,lung  . Anxiety disorder Maternal Aunt   . Anxiety disorder Maternal Uncle   . Bipolar disorder Maternal Uncle   . Breast cancer Maternal Uncle        CML  . Leukemia Other   . Colon cancer Other        lung-2 maternal great uncles    Social History:  Social History   Socioeconomic History  . Marital status: Legally Separated    Spouse name: Not on file  . Number of children: 3  . Years of education: Not on file  . Highest education level: Not on file  Occupational History  . Not on file  Tobacco Use  . Smoking status: Current Every Day Smoker    Packs/day: 1.00    Years: 15.00    Pack years: 15.00    Types: Cigarettes    Start date: 03/13/1997  . Smokeless tobacco: Never Used  . Tobacco comment: "working on it" Was smoking 2.5 packs a day  Vaping Use  . Vaping Use: Never used  Substance and Sexual Activity  . Alcohol use: No  . Drug use: No  . Sexual activity: Yes    Partners: Male    Birth control/protection: None  Other Topics Concern  . Not on file  Social History Narrative  . Not on file   Social Determinants of Health   Financial Resource Strain:   . Difficulty of Paying Living Expenses: Not on file  Food Insecurity:   . Worried About Charity fundraiser in the Last Year: Not on file  . Ran Out of Food in the Last Year: Not on file  Transportation Needs:   . Lack of Transportation (Medical): Not on file  . Lack of Transportation (Non-Medical): Not on file  Physical Activity:   . Days of Exercise per Week: Not on file  . Minutes of Exercise per Session: Not on file  Stress:   . Feeling of Stress : Not on file  Social Connections:   . Frequency of Communication with Friends and Family: Not on file  . Frequency of Social Gatherings with Friends and Family: Not on file  . Attends Religious Services: Not on file  . Active Member of Clubs or Organizations: Not on file  . Attends Theatre manager Meetings: Not on file  . Marital Status: Not on file    Allergies:  Allergies  Allergen Reactions  . Prozac [Fluoxetine]     Suicidal thoughts  . Topiramate Other (See Comments)    Topamax-dizziness  . Lexapro [Escitalopram Oxalate] Other (See Comments)    Flat affect, No emotions    Metabolic Disorder Labs: Lab Results  Component Value Date  HGBA1C 5.4 01/07/2013   MPG 108 01/07/2013   Lab Results  Component Value Date   PROLACTIN 13.2 06/12/2016   Lab Results  Component Value Date   CHOL 171 10/08/2018   TRIG 200 (H) 10/08/2018   HDL 51 10/08/2018   CHOLHDL 3.4 10/08/2018   VLDL 31 01/07/2013   LDLCALC 80 10/08/2018   LDLCALC 90 01/07/2013   Lab Results  Component Value Date   TSH 1.971 10/24/2019   TSH 1.480 03/05/2019    Therapeutic Level Labs: Lab Results  Component Value Date   LITHIUM 0.45 (L) 01/27/2020   LITHIUM 0.24 (L) 09/03/2019   No results found for: VALPROATE No components found for:  CBMZ  Current Medications: Current Outpatient Medications  Medication Sig Dispense Refill  . ALPRAZolam (XANAX) 1 MG tablet Take 1 tablet (1 mg total) by mouth 4 (four) times daily. 120 tablet 2  . amphetamine-dextroamphetamine (ADDERALL) 10 MG tablet Take 1 tablet (10 mg total) by mouth 3 (three) times daily. 90 tablet 0  . Cholecalciferol (VITAMIN D3) 250 MCG (10000 UT) capsule Take 10,000 Units by mouth daily.    . Cyanocobalamin (B-12 PO) Take 1 tablet by mouth daily.    . cyclobenzaprine (FLEXERIL) 5 MG tablet Take 5 mg by mouth 3 (three) times daily as needed.    . desogestrel-ethinyl estradiol (MIRCETTE) 0.15-0.02/0.01 MG (21/5) tablet TAKE (1) TABLET BY MOUTH ONCE DAILY. 28 tablet 12  . diltiazem (CARDIZEM) 60 MG tablet Take 60 mg as needed daily for palpitations 90 tablet 1  . DULoxetine (CYMBALTA) 30 MG capsule Take 1 capsule (30 mg total) by mouth daily. 30 capsule 2  . DULoxetine (CYMBALTA) 60 MG capsule Take 1 capsule (60 mg total) by  mouth daily. 30 capsule 2  . gabapentin (NEURONTIN) 300 MG capsule 1 PO q HS, may increase to 1 PO TID if tolerated 90 capsule 5  . ibuprofen (ADVIL,MOTRIN) 200 MG tablet Take 400-600 mg by mouth every 6 (six) hours as needed for mild pain or moderate pain.    Marland Kitchen levothyroxine (SYNTHROID) 50 MCG tablet TAKE 1 TABLET BY MOUTH ONCE A DAY. 30 tablet 3  . lithium 300 MG tablet Take 2 tablets (600 mg total) by mouth at bedtime. 60 tablet 2   No current facility-administered medications for this visit.     Musculoskeletal: Strength & Muscle Tone: within normal limits Gait & Station: normal Patient leans: N/A  Psychiatric Specialty Exam: Review of Systems  Constitutional: Positive for fatigue.  Psychiatric/Behavioral: Positive for dysphoric mood.  All other systems reviewed and are negative.   There were no vitals taken for this visit.There is no height or weight on file to calculate BMI.  General Appearance: Casual and Fairly Groomed  Eye Contact:  Good  Speech:  Clear and Coherent  Volume:  Decreased  Mood:  Depressed  Affect:  Flat  Thought Process:  Goal Directed  Orientation:  Full (Time, Place, and Person)  Thought Content: Rumination   Suicidal Thoughts:  No  Homicidal Thoughts:  No  Memory:  Immediate;   Good Recent;   Good Remote;   Good  Judgement:  Good  Insight:  Fair  Psychomotor Activity:  Decreased  Concentration:  Concentration: Good and Attention Span: Good  Recall:  Good  Fund of Knowledge: Good  Language: Good  Akathisia:  No  Handed:  Right  AIMS (if indicated): not done  Assets:  Communication Skills Desire for Improvement Resilience Social Support Talents/Skills  ADL's:  Intact  Cognition: WNL  Sleep:  Good   Screenings: Mini-Mental     Office Visit from 10/25/2016 in [facility] [place]  Total Score (max 30 points ) 29    PHQ2-9     Office Visit from 10/08/2018 in [facility] Visit from 08/21/2017 in [place]  Office Visit from 07/18/2017 in [facility] Counselor from 07/10/2017 in [place] Office Visit from 07/03/2017 in [facility]  PHQ-2 Total Score 6 4 0 5 0  PHQ-9 Total Score 21 21 -- 22 --       Assessment and Plan: This patient is a 39 year old female with long-term dysthymia depression and numerous somatic complaints.  The increase in Adderall has significantly helped her fatigue but now she seems more depressed.  I will therefore increase Cymbalta to 90 mg daily for depression and chronic pain, continue lithium 600 mg for mood stabilization and Xanax 1 mg 4 times daily for anxiety.  She will return to see me in 4 weeks   [name], MD 07/29/2020, 10:38 AM

## 2020-07-30 ENCOUNTER — Ambulatory Visit (HOSPITAL_COMMUNITY): Admission: RE | Admit: 2020-07-30 | Payer: Medicare Other | Source: Ambulatory Visit

## 2020-07-30 ENCOUNTER — Ambulatory Visit (HOSPITAL_COMMUNITY): Payer: Medicare Other

## 2020-08-05 ENCOUNTER — Ambulatory Visit: Payer: Medicare Other | Admitting: Rheumatology

## 2020-08-10 ENCOUNTER — Ambulatory Visit: Payer: Medicare Other | Admitting: Rheumatology

## 2020-08-17 ENCOUNTER — Inpatient Hospital Stay (HOSPITAL_COMMUNITY): Payer: Medicare Other | Admitting: Hematology

## 2020-08-19 ENCOUNTER — Telehealth (INDEPENDENT_AMBULATORY_CARE_PROVIDER_SITE_OTHER): Payer: Medicare Other | Admitting: Cardiology

## 2020-08-19 ENCOUNTER — Other Ambulatory Visit: Payer: Self-pay

## 2020-08-19 ENCOUNTER — Encounter: Payer: Self-pay | Admitting: Cardiology

## 2020-08-19 VITALS — BP 133/90 | HR 107 | Ht 64.5 in | Wt 184.0 lb

## 2020-08-19 DIAGNOSIS — I498 Other specified cardiac arrhythmias: Secondary | ICD-10-CM | POA: Diagnosis not present

## 2020-08-19 DIAGNOSIS — R002 Palpitations: Secondary | ICD-10-CM

## 2020-08-19 DIAGNOSIS — G90A Postural orthostatic tachycardia syndrome (POTS): Secondary | ICD-10-CM

## 2020-08-19 MED ORDER — MIDODRINE HCL 2.5 MG PO TABS: 2.5000 mg | ORAL_TABLET | Freq: Two times a day (BID) | ORAL | 3 refills | Status: DC

## 2020-08-19 NOTE — Patient Instructions (Signed)
Medication Instructions:  START Midodrine 2.5 mg twice a day   *If you need a refill on your cardiac medications before your next appointment, please call your pharmacy*   Lab Work: NONE today  If you have labs (blood work) drawn today and your tests are completely normal, you will receive your results only by:  [place] (if you have MyChart) OR  A paper copy in the mail If you have any lab test that is abnormal or we need to change your treatment, we will call you to review the results.   Testing/Procedures: NONE toady   Follow-Up: At [facility], you and your health needs are our priority.  As part of our continuing mission to provide you with exceptional heart care, we have created designated Provider Care Teams.  These Care Teams include your primary Cardiologist (physician) and Advanced Practice Providers (APPs -  Physician Assistants and Nurse Practitioners) who all work together to provide you with the care you need, when you need it.  We recommend signing up for the patient portal called "MyChart".  Sign up information is provided on this After Visit Summary.  MyChart is used to connect with patients for Virtual Visits (Telemedicine).  Patients are able to view lab/test results, encounter notes, upcoming appointments, etc.  Non-urgent messages can be sent to your provider as well.   To learn more about what you can do with MyChart, go to [email or web address].    Your next appointment:   4 month(s)  The format for your next appointment:   In Person  Provider:   Carlyle Dolly, MD   Other Instructions None      Thank you for choosing [facility] !

## 2020-08-19 NOTE — Progress Notes (Addendum)
Virtual Visit via Telephone Note   This visit type was conducted due to national recommendations for restrictions regarding the COVID-19 Pandemic (e.g. social distancing) in an effort to limit this patient's exposure and mitigate transmission in our community.  Due to her co-morbid illnesses, this patient is at least at moderate risk for complications without adequate follow up.  This format is felt to be most appropriate for this patient at this time.  The patient did not have access to video technology/had technical difficulties with video requiring transitioning to audio format only (telephone).  All issues noted in this document were discussed and addressed.  No physical exam could be performed with this format.  Please refer to the patient's chart for her  consent to telehealth for [facility].    Date:  08/19/2020   ID:  Destiny Orr, DOB 1981-01-13, MRN [id number] The patient was identified using 2 identifiers.  Patient Location: Home Provider Location: Office/Clinic  PCP:  Eunice Blase, MD  Cardiologist:  Carlyle Dolly, MD  Electrophysiologist:  None   Evaluation Performed:  Follow-Up Visit  Chief Complaint:  Follow up visit  History of Present Illness:    Destiny Orr is a 39 y.o. female seen today for follow up of the following medical problems.   1. Palpitations/POTS/Autonomic dysfunction - several year history, previously evaluated by EP - prior monitors have not show arrhythmias, has had sinus tachycardia.  - significant side effects on beta blockers even low doses, has been tried on several in the past. Recent side effects on recent retry of lopressor 12.5mg , did not tolerate.  - notes mentios avoiding midodrine due to history of raynauds  - labile HR and bp's.  - sbp's 80-180s bps - HRs 60s-170s - taking dilitazem prn - staying well hydrated, gatorade zero - prior LE edema, had been on lasix and this resolved. Unclear etiology.   - last visit started  florinef 0.1mg  daily - was on florinef for a few weeks, reported headaches and nausea. SYmptoms with stopping medicine.  - high heart rates ongoing at times, can have some dizziness as well   The patient does not have symptoms concerning for COVID-19 infection (fever, chills, cough, or new shortness of breath).    Past Medical History:  Diagnosis Date   Abnormal Papanicolaou smear of cervix with positive human papilloma virus (HPV) test 10/11/2018   LSIL +HPV, get colpo___   Abnormal uterine bleeding (AUB) 12/09/2014   Anxiety    Arthritis    Depression    DJD (degenerative joint disease)    Dumping syndrome    Dysmenorrhea 07/15/2014   Fatigue 12/24/2012   Fibromyalgia diagnosed April 2016   Headache    Hx of migraine headaches 12/24/2012   Inappropriate sinus tachycardia    Irregular menstrual bleeding 07/15/2014   Pelvic pain in female 03/03/2016   Pneumothorax, spontaneous, tension    Post concussion syndrome    POTS (postural orthostatic tachycardia syndrome)    PTSD (post-traumatic stress disorder)    Scoliosis    Thyroid disease    Tick bite 03/03/2016   Vitamin B 12 deficiency    Past Surgical History:  Procedure Laterality Date   bone spurs toes Right    CESAREAN SECTION     COLONOSCOPY     endooscopy     KNEE SURGERY     rotate cuff lt arm       Current Meds  Medication Sig   ALPRAZolam (XANAX) 1 MG tablet Take 1 tablet (1  mg total) by mouth 4 (four) times daily.   amphetamine-dextroamphetamine (ADDERALL) 10 MG tablet Take 1 tablet (10 mg total) by mouth 3 (three) times daily.   Cholecalciferol (VITAMIN D3) 250 MCG (10000 UT) capsule Take 10,000 Units by mouth daily.   Cyanocobalamin (B-12 PO) Take 1 tablet by mouth daily.   cyclobenzaprine (FLEXERIL) 5 MG tablet Take 5 mg by mouth 3 (three) times daily as needed.   desogestrel-ethinyl estradiol (MIRCETTE) 0.15-0.02/0.01 MG (21/5) tablet TAKE (1) TABLET BY MOUTH ONCE DAILY.    diltiazem (CARDIZEM) 60 MG tablet Take 60 mg as needed daily for palpitations   DULoxetine (CYMBALTA) 60 MG capsule Take 1 capsule (60 mg total) by mouth daily.   gabapentin (NEURONTIN) 300 MG capsule 1 PO q HS, may increase to 1 PO TID if tolerated   ibuprofen (ADVIL,MOTRIN) 200 MG tablet Take 400-600 mg by mouth every 6 (six) hours as needed for mild pain or moderate pain.   levothyroxine (SYNTHROID) 50 MCG tablet TAKE 1 TABLET BY MOUTH ONCE A DAY.   lithium 300 MG tablet Take 2 tablets (600 mg total) by mouth at bedtime.   [DISCONTINUED] DULoxetine (CYMBALTA) 30 MG capsule Take 1 capsule (30 mg total) by mouth daily.     Allergies:   Prozac [fluoxetine], Topiramate, and Lexapro [escitalopram oxalate]   Social History   Tobacco Use   Smoking status: Current Every Day Smoker    Packs/day: 1.00    Years: 15.00    Pack years: 15.00    Types: Cigarettes    Start date: 03/13/1997   Smokeless tobacco: Never Used   Tobacco comment: "working on it" Was smoking 2.5 packs a day  Vaping Use   Vaping Use: Never used  Substance Use Topics   Alcohol use: No   Drug use: No     Family Hx: The patient's family history includes Alcohol abuse in her father; Anxiety disorder in her maternal aunt and maternal uncle; Atrial fibrillation in her father; Bipolar disorder in her maternal uncle; Breast cancer in her maternal grandmother and maternal uncle; COPD in her paternal grandfather; Cancer in her maternal grandfather and maternal grandmother; Colon cancer in an other family member; Diabetes in her paternal grandfather; Heart disease in her maternal grandmother and paternal grandfather; Hypertension in her father and paternal grandfather; Leukemia in an other family member; Other in her maternal grandmother; Stroke in her paternal grandfather.  ROS:   Please see the history of present illness.     All other systems reviewed and are negative.   Prior CV studies:   The following studies  were reviewed today:  12/2015 echo Study Conclusions  - Left ventricle: The cavity size was normal. Wall thickness was  normal. Systolic function was normal. The estimated ejection  fraction was in the range of 60% to 65%. Wall motion was normal;  there were no regional wall motion abnormalities. Left  ventricular diastolic function parameters were normal.  12/2015 Holter monitor 48 hour Holter monitor reviewed. Sinus rhythm and sinus tachycardia noted. Heart rate ranged from 56 bpm up to 143 beats bpm with average heart rate 90 bpm. No sustained arrhythmias. Rare PACs and PVCs/fusion beat. No pauses.     Labs/Other Tests and Data Reviewed:    EKG:  No ECG reviewed.  Recent Labs: 10/24/2019: TSH 1.971 06/09/2020: ALT 19; BUN 6; Creatinine, Ser 0.85; Hemoglobin 14.0; Platelets 404; Potassium 3.9; Sodium 140   Recent Lipid Panel Lab Results  Component Value Date/Time   CHOL 171  10/08/2018 02:27 PM   TRIG 200 (H) 10/08/2018 02:27 PM   HDL 51 10/08/2018 02:27 PM   CHOLHDL 3.4 10/08/2018 02:27 PM   CHOLHDL 3.2 01/07/2013 09:49 AM   LDLCALC 80 10/08/2018 02:27 PM    Wt Readings from Last 3 Encounters:  08/19/20 184 lb (83.5 kg)  06/24/20 184 lb (83.5 kg)  06/14/20 186 lb 6.4 oz (84.6 kg)     Risk Assessment/Calculations:     Objective:    Vital Signs:  BP 133/90    Pulse (!) 107    Ht 5' 4.5" (1.638 m)    Wt 184 lb (83.5 kg)    BMI 31.10 kg/m    Normal affect. Normal speech pattern and tone. Comfortable, no apparent distress. No audible signs of sob or wheezing.   ASSESSMENT & PLAN:    1. Palpitatoins/POTS/Autonomic dysfunction - ongoing symptoms - has not tolerated beta blocker previously, did not tolerate low dose florinef recently. She is taking prn diltiazem - will try low dose midodrine 2.5mg  bid and monitor symptoms   F/u 4 months    Shared Decision Making/Informed Consent        COVID-19 Education: The signs and symptoms of COVID-19 were discussed  with the patient and how to seek care for testing (follow up with PCP or arrange E-visit).  The importance of social distancing was discussed today.  Time:   Today, I have spent 14 minutes with the patient with telehealth technology discussing the above problems.     Medication Adjustments/Labs and Tests Ordered: Current medicines are reviewed at length with the patient today.  Concerns regarding medicines are outlined above.   Tests Ordered: No orders of the defined types were placed in this encounter.   Medication Changes: No orders of the defined types were placed in this encounter.   Follow Up:  Virtual or in clinic 4 months  Signed, Carlyle Dolly, MD  08/19/2020 2:24 PM

## 2020-08-19 NOTE — Addendum Note (Signed)
Addended by: Barbarann Ehlers A on: 08/19/2020 02:28 PM   Modules accepted: Orders

## 2020-08-24 ENCOUNTER — Encounter (HOSPITAL_COMMUNITY): Payer: Self-pay | Admitting: Psychiatry

## 2020-08-24 ENCOUNTER — Telehealth (INDEPENDENT_AMBULATORY_CARE_PROVIDER_SITE_OTHER): Payer: Medicare Other | Admitting: Psychiatry

## 2020-08-24 ENCOUNTER — Other Ambulatory Visit: Payer: Self-pay

## 2020-08-24 DIAGNOSIS — F332 Major depressive disorder, recurrent severe without psychotic features: Secondary | ICD-10-CM | POA: Diagnosis not present

## 2020-08-24 DIAGNOSIS — F4310 Post-traumatic stress disorder, unspecified: Secondary | ICD-10-CM | POA: Diagnosis not present

## 2020-08-24 MED ORDER — LITHIUM CARBONATE 300 MG PO TABS
600.0000 mg | ORAL_TABLET | Freq: Every day | ORAL | 2 refills | Status: DC
Start: 2020-08-24 — End: 2020-09-17

## 2020-08-24 MED ORDER — ALPRAZOLAM 1 MG PO TABS: 1.0000 mg | ORAL_TABLET | Freq: Four times a day (QID) | ORAL | 2 refills | Status: DC

## 2020-08-24 MED ORDER — AMPHETAMINE-DEXTROAMPHETAMINE 10 MG PO TABS: 10.0000 mg | ORAL_TABLET | Freq: Three times a day (TID) | ORAL | 0 refills | Status: DC

## 2020-08-24 MED ORDER — DULOXETINE HCL 60 MG PO CPEP: 60.0000 mg | ORAL_CAPSULE | Freq: Two times a day (BID) | ORAL | 2 refills | Status: DC

## 2020-08-24 NOTE — Progress Notes (Signed)
Virtual Visit via Video Note  I connected with Destiny Orr on 08/24/20 at  1:40 PM EST by a video enabled telemedicine application and verified that I am speaking with the correct person using two identifiers.  Location: Patient: home Provider:home   I discussed the limitations of evaluation and management by telemedicine and the availability of in person appointments. The patient expressed understanding and agreed to proceed    I discussed the assessment and treatment plan with the patient. The patient was provided an opportunity to ask questions and all were answered. The patient agreed with the plan and demonstrated an understanding of the instructions.   The patient was advised to call back or seek an in-person evaluation if the symptoms worsen or if the condition fails to improve as anticipated.  I provided 15 minutes of non-face-to-face time during this encounter.    , MD  BH MD/PA/NP OP Progress Note  08/24/2020 2:01 PM Destiny Orr  MRN:  [id number]  Chief Complaint:  Chief Complaint    Depression; Anxiety; Follow-up     HPI: This patient is a 39-year-old separated white female who lives with her parents 2 daughters and 1 son in [place].  She is on disability from [employer] and get Social Security disability.  The patient returns for follow-up after 4 weeks.  She was supposed to go in for bone marrow biopsy to find out about her leukocytosis but she did not go.  She also canceled her Pap smear.  She claims that she does does not feel like going anywhere getting outside of her house.  She seems irritable and dysphoric today.  She states that many days she just wakes up but does not feel like doing anything and does not want to be around people.  She states that leaving her house causes significant anxiety.  The patient has tried working with both therapist in our office and does not feel comfortable with either 1.  She claims that she will call her insurance  to try to find another one.  Given her chronic depression I have looked at her medication list that she has tried virtually every antidepressant available.  We discussed transcranial magnetic therapy but she cannot get to [place] several days a week for it.  We also discussed ketamine treatment today and we will need to try to find a clinic that can accept her insurance.  In the meantime I will increase her Cymbalta.  She denies direct suicidal ideation Visit Diagnosis:    ICD-10-CM   1. Major depressive disorder, recurrent, severe without psychotic features (HCC)  F33.2   2. PTSD (post-traumatic stress disorder)  F43.10     Past Psychiatric History: Long-term outpatient treatment for depression and anxiety  Past Medical History:  Past Medical History:  Diagnosis Date  . Abnormal Papanicolaou smear of cervix with positive human papilloma virus (HPV) test 10/11/2018   LSIL +HPV, get colpo___  . Abnormal uterine bleeding (AUB) 12/09/2014  . Anxiety   . Arthritis   . Depression   . DJD (degenerative joint disease)   . Dumping syndrome   . Dysmenorrhea 07/15/2014  . Fatigue 12/24/2012  . Fibromyalgia diagnosed April 2016  . Headache   . Hx of migraine headaches 12/24/2012  . Inappropriate sinus tachycardia   . Irregular menstrual bleeding 07/15/2014  . Pelvic pain in female 03/03/2016  . Pneumothorax, spontaneous, tension   . Post concussion syndrome   . POTS (postural orthostatic tachycardia syndrome)   . PTSD (post-traumatic   stress disorder)   . Scoliosis   . Thyroid disease   . Tick bite 03/03/2016  . Vitamin B 12 deficiency     Past Surgical History:  Procedure Laterality Date  . bone spurs toes Right   . CESAREAN SECTION    . COLONOSCOPY    . endooscopy    . KNEE SURGERY    . rotate cuff lt arm      Family Psychiatric History: see below  Family History:  Family History  Problem Relation Age of Onset  . Hypertension Father   . Atrial fibrillation Father   . Alcohol  abuse Father   . Cancer Maternal Grandmother        skin   . Heart disease Maternal Grandmother   . Other Maternal Grandmother        had thyroid removed  . Breast cancer Maternal Grandmother   . Heart disease Paternal Grandfather   . COPD Paternal Grandfather   . Hypertension Paternal Grandfather   . Diabetes Paternal Grandfather   . Stroke Paternal Grandfather   . Cancer Maternal Grandfather        bladder,lung  . Anxiety disorder Maternal Aunt   . Anxiety disorder Maternal Uncle   . Bipolar disorder Maternal Uncle   . Breast cancer Maternal Uncle        CML  . Leukemia Other   . Colon cancer Other        lung-2 maternal great uncles    Social History:  Social History   Socioeconomic History  . Marital status: Legally Separated    Spouse name: Not on file  . Number of children: 3  . Years of education: Not on file  . Highest education level: Not on file  Occupational History  . Not on file  Tobacco Use  . Smoking status: Current Every Day Smoker    Packs/day: 1.00    Years: 15.00    Pack years: 15.00    Types: Cigarettes    Start date: 03/13/1997  . Smokeless tobacco: Never Used  . Tobacco comment: "working on it" Was smoking 2.5 packs a day  Vaping Use  . Vaping Use: Never used  Substance and Sexual Activity  . Alcohol use: No  . Drug use: No  . Sexual activity: Yes    Partners: Male    Birth control/protection: None  Other Topics Concern  . Not on file  Social History Narrative  . Not on file   Social Determinants of Health   Financial Resource Strain:   . Difficulty of Paying Living Expenses: Not on file  Food Insecurity:   . Worried About Running Out of Food in the Last Year: Not on file  . Ran Out of Food in the Last Year: Not on file  Transportation Needs:   . Lack of Transportation (Medical): Not on file  . Lack of Transportation (Non-Medical): Not on file  Physical Activity:   . Days of Exercise per Week: Not on file  . Minutes of Exercise  per Session: Not on file  Stress:   . Feeling of Stress : Not on file  Social Connections:   . Frequency of Communication with Friends and Family: Not on file  . Frequency of Social Gatherings with Friends and Family: Not on file  . Attends Religious Services: Not on file  . Active Member of Clubs or Organizations: Not on file  . Attends Club or Organization Meetings: Not on file  . Marital Status: Not on   file    Allergies:  Allergies  Allergen Reactions  . Prozac [Fluoxetine]     Suicidal thoughts  . Topiramate Other (See Comments)    Topamax-dizziness  . Lexapro [Escitalopram Oxalate] Other (See Comments)    Flat affect, No emotions    Metabolic Disorder Labs: Lab Results  Component Value Date   HGBA1C 5.4 01/07/2013   MPG 108 01/07/2013   Lab Results  Component Value Date   PROLACTIN 13.2 06/12/2016   Lab Results  Component Value Date   CHOL 171 10/08/2018   TRIG 200 (H) 10/08/2018   HDL 51 10/08/2018   CHOLHDL 3.4 10/08/2018   VLDL 31 01/07/2013   LDLCALC 80 10/08/2018   LDLCALC 90 01/07/2013   Lab Results  Component Value Date   TSH 1.971 10/24/2019   TSH 1.480 03/05/2019    Therapeutic Level Labs: Lab Results  Component Value Date   LITHIUM 0.45 (L) 01/27/2020   LITHIUM 0.24 (L) 09/03/2019   No results found for: VALPROATE No components found for:  CBMZ  Current Medications: Current Outpatient Medications  Medication Sig Dispense Refill  . ALPRAZolam (XANAX) 1 MG tablet Take 1 tablet (1 mg total) by mouth 4 (four) times daily. 120 tablet 2  . amphetamine-dextroamphetamine (ADDERALL) 10 MG tablet Take 1 tablet (10 mg total) by mouth 3 (three) times daily. 90 tablet 0  . Cholecalciferol (VITAMIN D3) 250 MCG (10000 UT) capsule Take 10,000 Units by mouth daily.    . Cyanocobalamin (B-12 PO) Take 1 tablet by mouth daily.    . cyclobenzaprine (FLEXERIL) 5 MG tablet Take 5 mg by mouth 3 (three) times daily as needed.    . desogestrel-ethinyl estradiol  (MIRCETTE) 0.15-0.02/0.01 MG (21/5) tablet TAKE (1) TABLET BY MOUTH ONCE DAILY. 28 tablet 12  . diltiazem (CARDIZEM) 60 MG tablet Take 60 mg as needed daily for palpitations 90 tablet 1  . DULoxetine (CYMBALTA) 60 MG capsule Take 1 capsule (60 mg total) by mouth 2 (two) times daily. 60 capsule 2  . gabapentin (NEURONTIN) 300 MG capsule 1 PO q HS, may increase to 1 PO TID if tolerated 90 capsule 5  . ibuprofen (ADVIL,MOTRIN) 200 MG tablet Take 400-600 mg by mouth every 6 (six) hours as needed for mild pain or moderate pain.    Marland Kitchen levothyroxine (SYNTHROID) 50 MCG tablet TAKE 1 TABLET BY MOUTH ONCE A DAY. 30 tablet 3  . lithium 300 MG tablet Take 2 tablets (600 mg total) by mouth at bedtime. 60 tablet 2  . midodrine (PROAMATINE) 2.5 MG tablet Take 1 tablet (2.5 mg total) by mouth 2 (two) times daily with a meal. 180 tablet 3   No current facility-administered medications for this visit.     Musculoskeletal: Strength & Muscle Tone: within normal limits Gait & Station: normal Patient leans: N/A  Psychiatric Specialty Exam: Review of Systems  Constitutional: Positive for fatigue.  Psychiatric/Behavioral: Positive for dysphoric mood. The patient is nervous/anxious.   All other systems reviewed and are negative.   There were no vitals taken for this visit.There is no height or weight on file to calculate BMI.  General Appearance: Casual and Fairly Groomed  Eye Contact:  Fair  Speech:  Clear and Coherent  Volume:  Decreased  Mood:  Depressed and Irritable  Affect:  Depressed and Flat  Thought Process:  Goal Directed  Orientation:  Full (Time, Place, and Person)  Thought Content: Rumination   Suicidal Thoughts:  No  Homicidal Thoughts:  No  Memory:  Immediate;   Good Recent;   Good Remote;   Good  Judgement:  Fair  Insight:  Fair  Psychomotor Activity:  Decreased  Concentration:  Concentration: Fair and Attention Span: Fair  Recall:  Good  Fund of Knowledge: Good  Language: Good   Akathisia:  No  Handed:  Right  AIMS (if indicated): not done  Assets:  Communication Skills Resilience Social Support Talents/Skills  ADL's:  Intact  Cognition: WNL  Sleep:  Fair   Screenings: Mini-Mental     Office Visit from 10/25/2016 in [facility]  Total Score (max 30 points ) 29    PHQ2-9     Office Visit from 10/08/2018 in [facility] Visit from 08/21/2017 in [facility] Office Visit from 07/18/2017 in [facility] Counselor from 07/10/2017 in [facility] Office Visit from 07/03/2017 in [facility]  PHQ-2 Total Score 6 4 0 5 0  PHQ-9 Total Score 21 21 -- 22 --       Assessment and Plan: Patient is a 39 year old female with long-term dysthymia depression and numerous somatic complaints.  She is still complaining of depression so we will increase Cymbalta to 120 mg daily continue lithium 600 mg for mood stabilization, Adderall 10 mg 3 times daily for focus and alertness and Xanax 1 mg 4 times daily for anxiety.  She will return to see me in 4 weeks   [name], MD 08/24/2020, 2:01 PM

## 2020-09-02 ENCOUNTER — Ambulatory Visit: Payer: Medicare Other | Admitting: Rheumatology

## 2020-09-09 ENCOUNTER — Other Ambulatory Visit (HOSPITAL_COMMUNITY): Payer: Medicare Other

## 2020-09-13 ENCOUNTER — Ambulatory Visit (HOSPITAL_COMMUNITY): Payer: Medicare Other | Admitting: Hematology

## 2020-09-17 ENCOUNTER — Telehealth (INDEPENDENT_AMBULATORY_CARE_PROVIDER_SITE_OTHER): Payer: Medicare Other | Admitting: Psychiatry

## 2020-09-17 ENCOUNTER — Encounter (HOSPITAL_COMMUNITY): Payer: Self-pay | Admitting: Psychiatry

## 2020-09-17 ENCOUNTER — Other Ambulatory Visit: Payer: Self-pay

## 2020-09-17 DIAGNOSIS — F332 Major depressive disorder, recurrent severe without psychotic features: Secondary | ICD-10-CM | POA: Diagnosis not present

## 2020-09-17 DIAGNOSIS — F4310 Post-traumatic stress disorder, unspecified: Secondary | ICD-10-CM | POA: Diagnosis not present

## 2020-09-17 MED ORDER — DULOXETINE HCL 60 MG PO CPEP: 60.0000 mg | ORAL_CAPSULE | Freq: Every day | ORAL | 2 refills | Status: DC

## 2020-09-17 MED ORDER — ALPRAZOLAM 1 MG PO TABS: 1.0000 mg | ORAL_TABLET | Freq: Four times a day (QID) | ORAL | 2 refills | Status: DC

## 2020-09-17 MED ORDER — LITHIUM CARBONATE 300 MG PO TABS
600.0000 mg | ORAL_TABLET | Freq: Every day | ORAL | 2 refills | Status: DC
Start: 2020-09-17 — End: 2020-10-12

## 2020-09-17 MED ORDER — AMPHETAMINE-DEXTROAMPHETAMINE 10 MG PO TABS: 10.0000 mg | ORAL_TABLET | Freq: Three times a day (TID) | ORAL | 0 refills | Status: DC

## 2020-09-17 NOTE — Progress Notes (Signed)
Virtual Visit via Video Note  I connected with Destiny Orr on 09/17/20 at 11:40 AM EST by a video enabled telemedicine application and verified that I am speaking with the correct person using two identifiers.  Location: Patient: home Provider: home   I discussed the limitations of evaluation and management by telemedicine and the availability of in person appointments. The patient expressed understanding and agreed to proceed.     I discussed the assessment and treatment plan with the patient. The patient was provided an opportunity to ask questions and all were answered. The patient agreed with the plan and demonstrated an understanding of the instructions.   The patient was advised to call back or seek an in-person evaluation if the symptoms worsen or if the condition fails to improve as anticipated.  I provided 15 minutes of non-face-to-face time during this encounter.   [name], MD  [facility] MD/PA/NP OP Progress Note  09/17/2020 11:55 AM Destiny Orr  MRN:  [id number]  Chief Complaint:  Chief Complaint    Depression; Anxiety     HPI: This patient is a 39 year old separated white female who lives with her parents 2 daughters and 1 son in [place]. She is on disability from [employer] and get Social Security disability.  The patient returns for follow-up after 4 weeks.  She still does not feel any better.  She just does not feel like getting up off the couch to do anything.  She is sleeping fairly well.  She thinks the Xanax helps her anxiety "most of the time.  The Adderall is helping her attention span for the most part.  However her fatigue and depression have not improved.  [facility] does [facility] treatment and she now has an appointment scheduled for early January.  I think we will wait on this rather than make any more medication changes and she agrees. Visit Diagnosis:    ICD-10-CM   1. Major depressive disorder, recurrent,  severe without psychotic features ([place])  F33.2   2. PTSD (post-traumatic stress disorder)  F43.10     Past Psychiatric History: Long-term outpatient treatment for depression and anxiety  Past Medical History:  Past Medical History:  Diagnosis Date  . Abnormal Papanicolaou smear of cervix with positive human papilloma virus (HPV) test 10/11/2018   LSIL +HPV, get colpo___  . Abnormal uterine bleeding (AUB) 12/09/2014  . Anxiety   . Arthritis   . Depression   . DJD (degenerative joint disease)   . Dumping syndrome   . Dysmenorrhea 07/15/2014  . Fatigue 12/24/2012  . Fibromyalgia diagnosed April 2016  . Headache   . Hx of migraine headaches 12/24/2012  . Inappropriate sinus tachycardia   . Irregular menstrual bleeding 07/15/2014  . Pelvic pain in female 03/03/2016  . Pneumothorax, spontaneous, tension   . Post concussion syndrome   . POTS (postural orthostatic tachycardia syndrome)   . PTSD (post-traumatic stress disorder)   . Scoliosis   . Thyroid disease   . Tick bite 03/03/2016  . Vitamin B 12 deficiency     Past Surgical History:  Procedure Laterality Date  . bone spurs toes Right   . CESAREAN SECTION    . COLONOSCOPY    . endooscopy    . KNEE SURGERY    . rotate cuff lt arm      Family Psychiatric History: see below  Family History:  Family History  Problem Relation Age of Onset  . Hypertension Father   .  Atrial fibrillation Father   . Alcohol abuse Father   . Cancer Maternal Grandmother        skin   . Heart disease Maternal Grandmother   . Other Maternal Grandmother        had thyroid removed  . Breast cancer Maternal Grandmother   . Heart disease Paternal Grandfather   . COPD Paternal Grandfather   . Hypertension Paternal Grandfather   . Diabetes Paternal Grandfather   . Stroke Paternal Grandfather   . Cancer Maternal Grandfather        bladder,lung  . Anxiety disorder Maternal Aunt   . Anxiety disorder Maternal Uncle   . Bipolar disorder Maternal Uncle    . Breast cancer Maternal Uncle        CML  . Leukemia Other   . Colon cancer Other        lung-2 maternal great uncles    Social History:  Social History   Socioeconomic History  . Marital status: Legally Separated    Spouse name: Not on file  . Number of children: 3  . Years of education: Not on file  . Highest education level: Not on file  Occupational History  . Not on file  Tobacco Use  . Smoking status: Current Every Day Smoker    Packs/day: 1.00    Years: 15.00    Pack years: 15.00    Types: Cigarettes    Start date: 03/13/1997  . Smokeless tobacco: Never Used  . Tobacco comment: "working on it" Was smoking 2.5 packs a day  Vaping Use  . Vaping Use: Never used  Substance and Sexual Activity  . Alcohol use: No  . Drug use: No  . Sexual activity: Yes    Partners: Male    Birth control/protection: None  Other Topics Concern  . Not on file  Social History Narrative  . Not on file   Social Determinants of Health   Financial Resource Strain: Not on file  Food Insecurity: Not on file  Transportation Needs: Not on file  Physical Activity: Not on file  Stress: Not on file  Social Connections: Not on file    Allergies:  Allergies  Allergen Reactions  . Prozac [Fluoxetine]     Suicidal thoughts  . Topiramate Other (See Comments)    Topamax-dizziness  . Lexapro [Escitalopram Oxalate] Other (See Comments)    Flat affect, No emotions    Metabolic Disorder Labs: Lab Results  Component Value Date   HGBA1C 5.4 01/07/2013   MPG 108 01/07/2013   Lab Results  Component Value Date   PROLACTIN 13.2 06/12/2016   Lab Results  Component Value Date   CHOL 171 10/08/2018   TRIG 200 (H) 10/08/2018   HDL 51 10/08/2018   CHOLHDL 3.4 10/08/2018   VLDL 31 01/07/2013   LDLCALC 80 10/08/2018   LDLCALC 90 01/07/2013   Lab Results  Component Value Date   TSH 1.971 10/24/2019   TSH 1.480 03/05/2019    Therapeutic Level Labs: Lab Results  Component Value Date    LITHIUM 0.45 (L) 01/27/2020   LITHIUM 0.24 (L) 09/03/2019   No results found for: VALPROATE No components found for:  CBMZ  Current Medications: Current Outpatient Medications  Medication Sig Dispense Refill  . ALPRAZolam (XANAX) 1 MG tablet Take 1 tablet (1 mg total) by mouth 4 (four) times daily. 120 tablet 2  . amphetamine-dextroamphetamine (ADDERALL) 10 MG tablet Take 1 tablet (10 mg total) by mouth 3 (three) times daily. [place]  tablet 0  . Cholecalciferol (VITAMIN D3) 250 MCG (10000 UT) capsule Take 10,000 Units by mouth daily.    . Cyanocobalamin (B-12 PO) Take 1 tablet by mouth daily.    . cyclobenzaprine (FLEXERIL) 5 MG tablet Take 5 mg by mouth 3 (three) times daily as needed.    . desogestrel-ethinyl estradiol (MIRCETTE) 0.15-0.02/0.01 MG (21/5) tablet TAKE (1) TABLET BY MOUTH ONCE DAILY. 28 tablet 12  . diltiazem (CARDIZEM) 60 MG tablet Take 60 mg as needed daily for palpitations 90 tablet 1  . DULoxetine (CYMBALTA) 60 MG capsule Take 1 capsule (60 mg total) by mouth daily. 30 capsule 2  . gabapentin (NEURONTIN) 300 MG capsule 1 PO q HS, may increase to 1 PO TID if tolerated 90 capsule 5  . ibuprofen (ADVIL,MOTRIN) 200 MG tablet Take 400-600 mg by mouth every 6 (six) hours as needed for mild pain or moderate pain.    Marland Kitchen levothyroxine (SYNTHROID) 50 MCG tablet TAKE 1 TABLET BY MOUTH ONCE A DAY. 30 tablet 3  . lithium 300 MG tablet Take 2 tablets (600 mg total) by mouth at bedtime. 60 tablet 2  . midodrine (PROAMATINE) 2.5 MG tablet Take 1 tablet (2.5 mg total) by mouth 2 (two) times daily with a meal. 180 tablet 3   No current facility-administered medications for this visit.     Musculoskeletal: Strength & Muscle Tone: within normal limits Gait & Station: normal Patient leans: N/A  Psychiatric Specialty Exam: Review of Systems  Constitutional: Positive for fatigue.  Eyes: Positive for visual disturbance.  Psychiatric/Behavioral: Positive for dysphoric mood.    There  were no vitals taken for this visit.There is no height or weight on file to calculate BMI.  General Appearance: Casual and Fairly Groomed  Eye Contact:  Good  Speech:  Clear and Coherent  Volume:  Decreased  Mood:  Depressed  Affect:  Flat  Thought Process:  Goal Directed  Orientation:  Full (Time, Place, and Person)  Thought Content: Rumination   Suicidal Thoughts:  No  Homicidal Thoughts:  No  Memory:  Immediate;   Good Recent;   Good Remote;   Good  Judgement:  Good  Insight:  Fair  Psychomotor Activity:  Decreased  Concentration:  Concentration: Fair and Attention Span: Fair  Recall:  Good  Fund of Knowledge: Good  Language: Good  Akathisia:  No  Handed:  Right  AIMS (if indicated): not done  Assets:  Communication Skills Desire for Improvement Resilience Social Support Talents/Skills  ADL's:  Intact  Cognition: WNL  Sleep:  Fair   Screenings: Mini-Mental   [place] Office Visit from 10/25/2016 in [facility] [place]  Total Score (max 30 points ) 29    PHQ2-9   [place] Visit from 10/08/2018 in [place] Office Visit from 08/21/2017 in [place] Office Visit from 07/18/2017 in [facility] Counselor from 07/10/2017 in [place] Office Visit from 07/03/2017 in [facility]  PHQ-2 Total Score 6 4 0 5 0  PHQ-9 Total Score 21 21 -- 22 --       Assessment and Plan: This patient is a 39 year old female with long-term dysthymia depression and numerous somatic complaints.  She is now scheduled to have an assessment for ketamine treatment and I think this may be the best option.  Since the increase in Cymbalta has not helped we will go back to 60 mg daily for depression continue lithium 600 mg daily for mood stabilization,  Adderall 10 mg 3 times daily for focus and Xanax 1 mg 4 times daily for anxiety.  She will return to see me in 4  weeks   [name], MD 09/17/2020, 11:55 AM

## 2020-09-22 ENCOUNTER — Inpatient Hospital Stay (HOSPITAL_COMMUNITY): Payer: Medicare Other | Attending: Medical

## 2020-09-29 ENCOUNTER — Ambulatory Visit (HOSPITAL_COMMUNITY): Payer: Medicare Other | Admitting: Hematology

## 2020-09-29 ENCOUNTER — Encounter (HOSPITAL_COMMUNITY): Payer: Self-pay

## 2020-10-07 ENCOUNTER — Inpatient Hospital Stay (HOSPITAL_COMMUNITY): Payer: Medicare Other | Attending: Hematology

## 2020-10-07 ENCOUNTER — Other Ambulatory Visit: Payer: Self-pay

## 2020-10-07 DIAGNOSIS — D729 Disorder of white blood cells, unspecified: Secondary | ICD-10-CM | POA: Diagnosis not present

## 2020-10-07 LAB — CBC WITH DIFFERENTIAL/PLATELET
Abs Immature Granulocytes: 0.05 10*3/uL (ref 0.00–0.07)
Basophils Absolute: 0.1 10*3/uL (ref 0.0–0.1)
Basophils Relative: 1 %
Eosinophils Absolute: 0.3 10*3/uL (ref 0.0–0.5)
Eosinophils Relative: 2 %
HCT: 40.5 % (ref 36.0–46.0)
Hemoglobin: 13.2 g/dL (ref 12.0–15.0)
Immature Granulocytes: 0 %
Lymphocytes Relative: 34 %
Lymphs Abs: 3.9 10*3/uL (ref 0.7–4.0)
MCH: 30.2 pg (ref 26.0–34.0)
MCHC: 32.6 g/dL (ref 30.0–36.0)
MCV: 92.7 fL (ref 80.0–100.0)
Monocytes Absolute: 0.8 10*3/uL (ref 0.1–1.0)
Monocytes Relative: 7 %
Neutro Abs: 6.4 10*3/uL (ref 1.7–7.7)
Neutrophils Relative %: 56 %
Platelets: 336 10*3/uL (ref 150–400)
RBC: 4.37 MIL/uL (ref 3.87–5.11)
RDW: 12.5 % (ref 11.5–15.5)
WBC: 11.4 10*3/uL — ABNORMAL HIGH (ref 4.0–10.5)
nRBC: 0 % (ref 0.0–0.2)

## 2020-10-07 LAB — COMPREHENSIVE METABOLIC PANEL
ALT: 12 U/L (ref 0–44)
AST: 12 U/L — ABNORMAL LOW (ref 15–41)
Albumin: 3.3 g/dL — ABNORMAL LOW (ref 3.5–5.0)
Alkaline Phosphatase: 99 U/L (ref 38–126)
Anion gap: 11 (ref 5–15)
BUN: 7 mg/dL (ref 6–20)
CO2: 25 mmol/L (ref 22–32)
Calcium: 9.3 mg/dL (ref 8.9–10.3)
Chloride: 101 mmol/L (ref 98–111)
Creatinine, Ser: 0.78 mg/dL (ref 0.44–1.00)
GFR, Estimated: >60 mL/min (ref >60)
Glucose, Bld: 108 mg/dL — ABNORMAL HIGH (ref 70–99)
Potassium: 4.1 mmol/L (ref 3.5–5.1)
Sodium: 137 mmol/L (ref 135–145)
Total Bilirubin: 0.2 mg/dL — ABNORMAL LOW (ref 0.3–1.2)
Total Protein: 6.3 g/dL — ABNORMAL LOW (ref 6.5–8.1)

## 2020-10-07 LAB — LACTATE DEHYDROGENASE: LDH: 99 U/L (ref 98–192)

## 2020-10-11 ENCOUNTER — Ambulatory Visit (HOSPITAL_COMMUNITY): Payer: Medicare Other | Admitting: Hematology

## 2020-10-12 ENCOUNTER — Other Ambulatory Visit: Payer: Self-pay

## 2020-10-12 ENCOUNTER — Telehealth (INDEPENDENT_AMBULATORY_CARE_PROVIDER_SITE_OTHER): Payer: Medicare Other | Admitting: Psychiatry

## 2020-10-12 ENCOUNTER — Encounter (HOSPITAL_COMMUNITY): Payer: Self-pay | Admitting: Psychiatry

## 2020-10-12 DIAGNOSIS — F332 Major depressive disorder, recurrent severe without psychotic features: Secondary | ICD-10-CM

## 2020-10-12 DIAGNOSIS — F4310 Post-traumatic stress disorder, unspecified: Secondary | ICD-10-CM

## 2020-10-12 MED ORDER — AMPHETAMINE-DEXTROAMPHETAMINE 10 MG PO TABS: 10.0000 mg | ORAL_TABLET | Freq: Three times a day (TID) | ORAL | 0 refills | Status: DC

## 2020-10-12 MED ORDER — ALPRAZOLAM 1 MG PO TABS: 1.0000 mg | ORAL_TABLET | Freq: Four times a day (QID) | ORAL | 2 refills | Status: DC

## 2020-10-12 MED ORDER — DULOXETINE HCL 60 MG PO CPEP: 60.0000 mg | ORAL_CAPSULE | Freq: Every day | ORAL | 2 refills | Status: DC

## 2020-10-12 MED ORDER — LITHIUM CARBONATE 300 MG PO TABS: 600.0000 mg | ORAL_TABLET | Freq: Every day | ORAL | 2 refills | Status: DC

## 2020-10-12 NOTE — Progress Notes (Signed)
Virtual Visit via Video Note  I connected with Destiny Orr on 10/12/20 at 10:00 AM EST by a video enabled telemedicine application and verified that I am speaking with the correct person using two identifiers.  Location: Patient: home Provider: office   I discussed the limitations of evaluation and management by telemedicine and the availability of in person appointments. The patient expressed understanding and agreed to proceed.    I discussed the assessment and treatment plan with the patient. The patient was provided an opportunity to ask questions and all were answered. The patient agreed with the plan and demonstrated an understanding of the instructions.   The patient was advised to call back or seek an in-person evaluation if the symptoms worsen or if the condition fails to improve as anticipated.  I provided 15 minutes of non-face-to-face time during this encounter.   [name], MD  [facility] MD/PA/NP OP Progress Note  10/12/2020 10:18 AM [name]  MRN:  [id number]  Chief Complaint:  Chief Complaint    Anxiety; Depression; Follow-up     HPI: This patient is a 40 year old separated white female who lives with her parents 2 daughters and 1 son in [place]. She is on disability from [employer] and get Social Security disability.  The patient returns for follow-up after 4 weeks.  She tells me that her daughter tested positive for coronavirus but was only sick for a few days.  The patient herself has had a sore throat and congestion for the last day or so as well as fatigue and body aches.  She has not yet gotten tested.  She did not get vaccinated because she "does not trust the vaccine."  She has not yet had her assessment for ketamine and it has been put off till the end of January.  She states that her mood is "pretty bad" and she has no energy.  I would rather not make any medication changes until she goes through the ketamine treatment.  She denies thoughts of self-harm or  suicidal ideation Visit Diagnosis:    ICD-10-CM   1. Major depressive disorder, recurrent, severe without psychotic features ([place])  F33.2   2. PTSD (post-traumatic stress disorder)  F43.10     Past Psychiatric History: Long-term outpatient treatment for depression and anxiety  Past Medical History:  Past Medical History:  Diagnosis Date  . Abnormal Papanicolaou smear of cervix with positive human papilloma virus (HPV) test 10/11/2018   LSIL +HPV, get colpo___  . Abnormal uterine bleeding (AUB) 12/09/2014  . Anxiety   . Arthritis   . Depression   . DJD (degenerative joint disease)   . Dumping syndrome   . Dysmenorrhea 07/15/2014  . Fatigue 12/24/2012  . Fibromyalgia diagnosed April 2016  . Headache   . Hx of migraine headaches 12/24/2012  . Inappropriate sinus tachycardia   . Irregular menstrual bleeding 07/15/2014  . Pelvic pain in female 03/03/2016  . Pneumothorax, spontaneous, tension   . Post concussion syndrome   . POTS (postural orthostatic tachycardia syndrome)   . PTSD (post-traumatic stress disorder)   . Scoliosis   . Thyroid disease   . Tick bite 03/03/2016  . Vitamin B 12 deficiency     Past Surgical History:  Procedure Laterality Date  . bone spurs toes Right   . CESAREAN SECTION    . COLONOSCOPY    . endooscopy    . KNEE SURGERY    . rotate cuff lt arm      Family Psychiatric History:  see below  Family History:  Family History  Problem Relation Age of Onset  . Hypertension Father   . Atrial fibrillation Father   . Alcohol abuse Father   . Cancer Maternal Grandmother        skin   . Heart disease Maternal Grandmother   . Other Maternal Grandmother        had thyroid removed  . Breast cancer Maternal Grandmother   . Heart disease Paternal Grandfather   . COPD Paternal Grandfather   . Hypertension Paternal Grandfather   . Diabetes Paternal Grandfather   . Stroke Paternal Grandfather   . Cancer Maternal Grandfather        bladder,lung  . Anxiety  disorder Maternal Aunt   . Anxiety disorder Maternal Uncle   . Bipolar disorder Maternal Uncle   . Breast cancer Maternal Uncle        CML  . Leukemia Other   . Colon cancer Other        lung-2 maternal great uncles    Social History:  Social History   Socioeconomic History  . Marital status: Legally Separated    Spouse name: Not on file  . Number of children: 3  . Years of education: Not on file  . Highest education level: Not on file  Occupational History  . Not on file  Tobacco Use  . Smoking status: Current Every Day Smoker    Packs/day: 1.00    Years: 15.00    Pack years: 15.00    Types: Cigarettes    Start date: 03/13/1997  . Smokeless tobacco: Never Used  . Tobacco comment: "working on it" Was smoking 2.5 packs a day  Vaping Use  . Vaping Use: Never used  Substance and Sexual Activity  . Alcohol use: No  . Drug use: No  . Sexual activity: Yes    Partners: Male    Birth control/protection: None  Other Topics Concern  . Not on file  Social History Narrative  . Not on file   Social Determinants of Health   Financial Resource Strain: Not on file  Food Insecurity: Not on file  Transportation Needs: Not on file  Physical Activity: Not on file  Stress: Not on file  Social Connections: Not on file    Allergies:  Allergies  Allergen Reactions  . Prozac [Fluoxetine]     Suicidal thoughts  . Topiramate Other (See Comments)    Topamax-dizziness  . Lexapro [Escitalopram Oxalate] Other (See Comments)    Flat affect, No emotions    Metabolic Disorder Labs: Lab Results  Component Value Date   HGBA1C 5.4 01/07/2013   MPG 108 01/07/2013   Lab Results  Component Value Date   PROLACTIN 13.2 06/12/2016   Lab Results  Component Value Date   CHOL 171 10/08/2018   TRIG 200 (H) 10/08/2018   HDL 51 10/08/2018   CHOLHDL 3.4 10/08/2018   VLDL 31 01/07/2013   LDLCALC 80 10/08/2018   LDLCALC 90 01/07/2013   Lab Results  Component Value Date   TSH 1.971  10/24/2019   TSH 1.480 03/05/2019    Therapeutic Level Labs: Lab Results  Component Value Date   LITHIUM 0.45 (L) 01/27/2020   LITHIUM 0.24 (L) 09/03/2019   No results found for: VALPROATE No components found for:  CBMZ  Current Medications: Current Outpatient Medications  Medication Sig Dispense Refill  . ALPRAZolam (XANAX) 1 MG tablet Take 1 tablet (1 mg total) by mouth 4 (four) times daily. 120 tablet  2  . amphetamine-dextroamphetamine (ADDERALL) 10 MG tablet Take 1 tablet (10 mg total) by mouth 3 (three) times daily. 90 tablet 0  . Cholecalciferol (VITAMIN D3) 250 MCG (10000 UT) capsule Take 10,000 Units by mouth daily.    . Cyanocobalamin (B-12 PO) Take 1 tablet by mouth daily.    . cyclobenzaprine (FLEXERIL) 5 MG tablet Take 5 mg by mouth 3 (three) times daily as needed.    . desogestrel-ethinyl estradiol (MIRCETTE) 0.15-0.02/0.01 MG (21/5) tablet TAKE (1) TABLET BY MOUTH ONCE DAILY. 28 tablet 12  . diltiazem (CARDIZEM) 60 MG tablet Take 60 mg as needed daily for palpitations 90 tablet 1  . DULoxetine (CYMBALTA) 60 MG capsule Take 1 capsule (60 mg total) by mouth daily. 30 capsule 2  . gabapentin (NEURONTIN) 300 MG capsule 1 PO q HS, may increase to 1 PO TID if tolerated 90 capsule 5  . ibuprofen (ADVIL,MOTRIN) 200 MG tablet Take 400-600 mg by mouth every 6 (six) hours as needed for mild pain or moderate pain.    Marland Kitchen levothyroxine (SYNTHROID) 50 MCG tablet TAKE 1 TABLET BY MOUTH ONCE A DAY. 30 tablet 3  . lithium 300 MG tablet Take 2 tablets (600 mg total) by mouth at bedtime. 60 tablet 2  . midodrine (PROAMATINE) 2.5 MG tablet Take 1 tablet (2.5 mg total) by mouth 2 (two) times daily with a meal. 180 tablet 3   No current facility-administered medications for this visit.     Musculoskeletal: Strength & Muscle Tone: within normal limits Gait & Station: normal Patient leans: N/A  Psychiatric Specialty Exam: Review of Systems  Constitutional: Positive for fatigue.  HENT:  Positive for congestion.   Musculoskeletal: Positive for myalgias.  Psychiatric/Behavioral: Positive for dysphoric mood.  All other systems reviewed and are negative.   There were no vitals taken for this visit.There is no height or weight on file to calculate BMI.  General Appearance: Casual and Fairly Groomed  Eye Contact:  Good  Speech:  Clear and Coherent  Volume:  Normal  Mood:  Dysphoric  Affect:  Flat  Thought Process:  Goal Directed  Orientation:  Full (Time, Place, and Person)  Thought Content: Rumination   Suicidal Thoughts:  No  Homicidal Thoughts:  No  Memory:  Immediate;   Good Recent;   Good Remote;   Good  Judgement:  Good  Insight:  Fair  Psychomotor Activity:  Decreased  Concentration:  Concentration: Fair and Attention Span: Fair  Recall:  Good  Fund of Knowledge: Good  Language: Good  Akathisia:  No  Handed:  Right  AIMS (if indicated): not done  Assets:  Communication Skills Desire for Improvement Resilience Social Support Talents/Skills  ADL's:  Intact  Cognition: WNL  Sleep:  Good   Screenings: Mini-Mental   [place] Office Visit from 10/25/2016 in [facility]  Total Score (max 30 points ) 29    PHQ2-9   [place] Office Visit from 10/08/2018 in [place] Office Visit from 08/21/2017 in [place] Office Visit from 07/18/2017 in [facility] Counselor from 07/10/2017 in [place] Office Visit from 07/03/2017 in [facility]  PHQ-2 Total Score 6 4 0 5 0  PHQ-9 Total Score 21 21 - 22 -       Assessment and Plan: This patient is a 40 year old female with long-term dysthymia depression numerous somatic complaints.  She now may be COVID-positive and needs to be tested.  Her ketamine treatment had  to be put off and this will start at the end of January and I would rather not make any medication changes until she goes  through this assessment.  She will continue Cymbalta 60 mg daily for depression, lithium 600 mg daily for mood stabilization, Adderall 10 mg 3 times daily for focus and Xanax 1 mg 4 times daily for anxiety.  She will return to see me in 4 weeks   [name], MD 10/12/2020, 10:18 AM

## 2020-11-01 ENCOUNTER — Encounter: Payer: Self-pay | Admitting: Family Medicine

## 2020-11-01 DIAGNOSIS — M79601 Pain in right arm: Secondary | ICD-10-CM

## 2020-11-01 DIAGNOSIS — M7989 Other specified soft tissue disorders: Secondary | ICD-10-CM

## 2020-11-02 ENCOUNTER — Other Ambulatory Visit (HOSPITAL_COMMUNITY)
Admission: RE | Admit: 2020-11-02 | Discharge: 2020-11-02 | Disposition: A | Discharge status: Home / Self Care | Payer: Medicare Other | Source: Ambulatory Visit | Attending: Family Medicine | Admitting: Family Medicine

## 2020-11-02 ENCOUNTER — Other Ambulatory Visit: Payer: Self-pay

## 2020-11-02 ENCOUNTER — Telehealth: Payer: Self-pay | Admitting: Family Medicine

## 2020-11-02 DIAGNOSIS — M79601 Pain in right arm: Secondary | ICD-10-CM | POA: Insufficient documentation

## 2020-11-02 DIAGNOSIS — M7989 Other specified soft tissue disorders: Secondary | ICD-10-CM | POA: Diagnosis not present

## 2020-11-02 LAB — D-DIMER, QUANTITATIVE: D-Dimer, Quant: 0.78 ug/mL-FEU — ABNORMAL HIGH (ref 0.00–0.50)

## 2020-11-02 NOTE — Telephone Encounter (Signed)
D-Dimer is only mildly positive, but is positive nonetheless.  I will order a doppler study to rule out blood clot.

## 2020-11-03 ENCOUNTER — Other Ambulatory Visit: Payer: Self-pay

## 2020-11-03 ENCOUNTER — Inpatient Hospital Stay (HOSPITAL_COMMUNITY): Payer: Medicare Other | Attending: Medical | Admitting: Hematology

## 2020-11-03 VITALS — BP 126/86 | HR 95 | Temp 99.7°F | Resp 17

## 2020-11-03 DIAGNOSIS — D72828 Other elevated white blood cell count: Secondary | ICD-10-CM | POA: Diagnosis not present

## 2020-11-03 DIAGNOSIS — E079 Disorder of thyroid, unspecified: Secondary | ICD-10-CM | POA: Diagnosis not present

## 2020-11-03 DIAGNOSIS — F17210 Nicotine dependence, cigarettes, uncomplicated: Secondary | ICD-10-CM | POA: Diagnosis not present

## 2020-11-03 DIAGNOSIS — Z79899 Other long term (current) drug therapy: Secondary | ICD-10-CM | POA: Diagnosis not present

## 2020-11-03 DIAGNOSIS — R7989 Other specified abnormal findings of blood chemistry: Secondary | ICD-10-CM | POA: Insufficient documentation

## 2020-11-03 DIAGNOSIS — D729 Disorder of white blood cells, unspecified: Secondary | ICD-10-CM | POA: Diagnosis not present

## 2020-11-03 DIAGNOSIS — R0602 Shortness of breath: Secondary | ICD-10-CM | POA: Insufficient documentation

## 2020-11-03 DIAGNOSIS — R519 Headache, unspecified: Secondary | ICD-10-CM | POA: Insufficient documentation

## 2020-11-03 DIAGNOSIS — R002 Palpitations: Secondary | ICD-10-CM | POA: Diagnosis not present

## 2020-11-03 NOTE — Patient Instructions (Signed)
[place] at [facility] [address] Discharge Instructions  You were seen today by Dr. Delton Coombes. He went over your recent results. Keep your appointment to have your ultrasound done to rule out a clot. Dr. Delton Coombes will see you back in 6 months for labs and follow up.   Thank you for choosing [place] at [facility] to provide your oncology and hematology care.  To afford each patient quality time with our provider, please arrive at least 15 minutes before your scheduled appointment time.   If you have a lab appointment with the [facility] please come in thru the Main Entrance and check in at the main information desk  You need to re-schedule your appointment should you arrive 10 or more minutes late.  We strive to give you quality time with our providers, and arriving late affects you and other patients whose appointments are after yours.  Also, if you no show three or more times for appointments you may be dismissed from the clinic at the providers discretion.     Again, thank you for choosing [facility].  Our hope is that these requests will decrease the amount of time that you wait before being seen by our physicians.       _____________________________________________________________  Should you have questions after your visit to [facility], please contact our office at [phone] between the hours of 8:00 a.m. and 4:30 p.m.  Voicemails left after 4:00 p.m. will not be returned until the following business day.  For prescription refill requests, have your pharmacy contact our office and allow 72 hours.    Cancer Center Support Programs:   > Cancer Support Group  2nd Tuesday of the month 1pm-2pm, Journey Room

## 2020-11-03 NOTE — Progress Notes (Signed)
[facility] [place], [place] [address]   CLINIC:  Medical Oncology/Hematology  PCP:  Eunice Blase, MD [address] [place] [address]  [phone]  REASON FOR VISIT:  Follow-up for neutrophilic leukocytosis  PRIOR THERAPY: None  CURRENT THERAPY: Observation  INTERVAL HISTORY:  [name], a 40 y.o. female, returns for routine follow-up for her neutrophilic leukocytosis. [name] was last seen on 06/14/2020.  Today she reports feeling fair. She complains of having pain along with tingling and burning in her right arm medially and was sent to the lab to get her D-dimer checked; the area was red on 01/31. She is no longer taking a diuretic. She is taking midodrine for her POTS and she continues experiencing occasional dizziness, headaches and palpitations. She has SOB but denies having any recent infections, F/C or night sweats.  She continues smoking. She was supposed to be scheduled to have an ultrasound done, but has not heard anything back yet; she is currently on OCP.   REVIEW OF SYSTEMS:  Review of Systems  Constitutional: Positive for appetite change (50%) and fatigue (25%).  Respiratory: Positive for shortness of breath.   Cardiovascular: Positive for palpitations.  Musculoskeletal: Positive for arthralgias (leg & R arm pain).  Neurological: Positive for dizziness and headaches.  All other systems reviewed and are negative.   PAST MEDICAL/SURGICAL HISTORY:  Past Medical History:  Diagnosis Date  . Abnormal Papanicolaou smear of cervix with positive human papilloma virus (HPV) test 10/11/2018   LSIL +HPV, get colpo___  . Abnormal uterine bleeding (AUB) 12/09/2014  . Anxiety   . Arthritis   . Depression   . DJD (degenerative joint disease)   . Dumping syndrome   . Dysmenorrhea 07/15/2014  . Fatigue 12/24/2012  . Fibromyalgia diagnosed April 2016  . Headache   . Hx of migraine headaches 12/24/2012  . Inappropriate sinus tachycardia    . Irregular menstrual bleeding 07/15/2014  . Pelvic pain in female 03/03/2016  . Pneumothorax, spontaneous, tension   . Post concussion syndrome   . POTS (postural orthostatic tachycardia syndrome)   . PTSD (post-traumatic stress disorder)   . Scoliosis   . Thyroid disease   . Tick bite 03/03/2016  . Vitamin B 12 deficiency    Past Surgical History:  Procedure Laterality Date  . bone spurs toes Right   . CESAREAN SECTION    . COLONOSCOPY    . endooscopy    . KNEE SURGERY    . rotate cuff lt arm      SOCIAL HISTORY:  Social History   Socioeconomic History  . Marital status: Legally Separated    Spouse name: Not on file  . Number of children: 3  . Years of education: Not on file  . Highest education level: Not on file  Occupational History  . Not on file  Tobacco Use  . Smoking status: Current Every Day Smoker    Packs/day: 1.00    Years: 15.00    Pack years: 15.00    Types: Cigarettes    Start date: 03/13/1997  . Smokeless tobacco: Never Used  . Tobacco comment: "working on it" Was smoking 2.5 packs a day  Vaping Use  . Vaping Use: Never used  Substance and Sexual Activity  . Alcohol use: No  . Drug use: No  . Sexual activity: Yes    Partners: Male    Birth control/protection: None  Other Topics Concern  . Not on file  Social  History Narrative  . Not on file   Social Determinants of Health   Financial Resource Strain: Not on file  Food Insecurity: Not on file  Transportation Needs: Not on file  Physical Activity: Not on file  Stress: Not on file  Social Connections: Not on file  Intimate Partner Violence: Not on file    FAMILY HISTORY:  Family History  Problem Relation Age of Onset  . Hypertension Father   . Atrial fibrillation Father   . Alcohol abuse Father   . Cancer Maternal Grandmother        skin   . Heart disease Maternal Grandmother   . Other Maternal Grandmother        had thyroid removed  . Breast cancer Maternal Grandmother   .  Heart disease Paternal Grandfather   . COPD Paternal Grandfather   . Hypertension Paternal Grandfather   . Diabetes Paternal Grandfather   . Stroke Paternal Grandfather   . Cancer Maternal Grandfather        bladder,lung  . Anxiety disorder Maternal Aunt   . Anxiety disorder Maternal Uncle   . Bipolar disorder Maternal Uncle   . Breast cancer Maternal Uncle        CML  . Leukemia Other   . Colon cancer Other        lung-2 maternal great uncles    CURRENT MEDICATIONS:  Current Outpatient Medications  Medication Sig Dispense Refill  . ALPRAZolam (XANAX) 1 MG tablet Take 1 tablet (1 mg total) by mouth 4 (four) times daily. 120 tablet 2  . amphetamine-dextroamphetamine (ADDERALL) 10 MG tablet Take 1 tablet (10 mg total) by mouth 3 (three) times daily. 90 tablet 0  . Cholecalciferol (VITAMIN D3) 250 MCG (10000 UT) capsule Take 10,000 Units by mouth daily.    . Cyanocobalamin (B-12 PO) Take 1 tablet by mouth daily.    . cyclobenzaprine (FLEXERIL) 5 MG tablet Take 5 mg by mouth 3 (three) times daily as needed.    . desogestrel-ethinyl estradiol (MIRCETTE) 0.15-0.02/0.01 MG (21/5) tablet TAKE (1) TABLET BY MOUTH ONCE DAILY. 28 tablet 12  . diltiazem (CARDIZEM) 60 MG tablet Take 60 mg as needed daily for palpitations 90 tablet 1  . DULoxetine (CYMBALTA) 60 MG capsule Take 1 capsule (60 mg total) by mouth daily. 30 capsule 2  . ibuprofen (ADVIL,MOTRIN) 200 MG tablet Take 400-600 mg by mouth every 6 (six) hours as needed for mild pain or moderate pain.    Marland Kitchen levothyroxine (SYNTHROID) 50 MCG tablet TAKE 1 TABLET BY MOUTH ONCE A DAY. 30 tablet 3  . lithium 300 MG tablet Take 2 tablets (600 mg total) by mouth at bedtime. 60 tablet 2  . midodrine (PROAMATINE) 2.5 MG tablet Take 1 tablet (2.5 mg total) by mouth 2 (two) times daily with a meal. 180 tablet 3  . PARoxetine (PAXIL) 20 MG tablet Take 20 mg by mouth daily.     No current facility-administered medications for this visit.    ALLERGIES:   Allergies  Allergen Reactions  . Prozac [Fluoxetine]     Suicidal thoughts  . Topiramate Other (See Comments)    Topamax-dizziness  . Lexapro [Escitalopram Oxalate] Other (See Comments)    Flat affect, No emotions    PHYSICAL EXAM:  Performance status (ECOG): 1 - Symptomatic but completely ambulatory  Vitals:   11/03/20 1647  BP: 126/86  Pulse: 95  Resp: 17  Temp: 99.7 F (37.6 C)  SpO2: 99%   Wt Readings from Last 3  Encounters:  08/19/20 184 lb (83.5 kg)  06/24/20 184 lb (83.5 kg)  06/14/20 186 lb 6.4 oz (84.6 kg)   Physical Exam Vitals reviewed.  Constitutional:      Appearance: Normal appearance.  Cardiovascular:     Rate and Rhythm: Normal rate and regular rhythm.     Pulses: Normal pulses.     Heart sounds: Normal heart sounds.  Pulmonary:     Effort: Pulmonary effort is normal.     Breath sounds: Normal breath sounds.  Chest:  Breasts:     Right: No axillary adenopathy.     Left: No axillary adenopathy.    Abdominal:     Palpations: Abdomen is soft. There is no hepatomegaly, splenomegaly or mass.     Tenderness: There is no abdominal tenderness.     Hernia: No hernia is present.  Musculoskeletal:     Right upper arm: Tenderness (medial lower bicep) present.     Right elbow: Tenderness present.     Right forearm: Tenderness (medial upper forearm) present.     Right lower leg: No edema.     Left lower leg: No edema.  Lymphadenopathy:     Cervical: No cervical adenopathy.     Upper Body:     Right upper body: No axillary or pectoral adenopathy.     Left upper body: No axillary or pectoral adenopathy.  Neurological:     General: No focal deficit present.     Mental Status: She is alert and oriented to person, place, and time.  Psychiatric:        Mood and Affect: Mood normal.        Behavior: Behavior normal.     LABORATORY DATA:  I have reviewed the labs as listed.  CBC Latest Ref Rng & Units 10/07/2020 06/09/2020 03/23/2020  WBC 4.0 - 10.5 K/uL  11.4(H) 18.6(H) 13.2(H)  Hemoglobin 12.0 - 15.0 g/dL 13.2 14.0 13.4  Hematocrit 36.0 - 46.0 % 40.5 42.2 40.4  Platelets 150 - 400 K/uL 336 404(H) 350   CMP Latest Ref Rng & Units 10/07/2020 06/09/2020 03/23/2020  Glucose 70 - 99 mg/dL 108(H) 100(H) 91  BUN 6 - 20 mg/dL 7 6 9   Creatinine 0.44 - 1.00 mg/dL 0.78 0.85 0.84  Sodium 135 - 145 mmol/L 137 140 139  Potassium 3.5 - 5.1 mmol/L 4.1 3.9 4.5  Chloride 98 - 111 mmol/L 101 105 104  CO2 22 - 32 mmol/L 25 24 21   Calcium 8.9 - 10.3 mg/dL 9.3 9.2 9.8  Total Protein 6.5 - 8.1 g/dL 6.3(L) 6.8 6.7  Total Bilirubin 0.3 - 1.2 mg/dL 0.2(L) 0.4 0.2  Alkaline Phos 38 - 126 U/L 99 118 -  AST 15 - 41 U/L 12(L) 15 13  ALT 0 - 44 U/L 12 19 12       Component Value Date/Time   RBC 4.37 10/07/2020 1046   MCV 92.7 10/07/2020 1046   MCV 91 10/08/2018 1427   MCH 30.2 10/07/2020 1046   MCHC 32.6 10/07/2020 1046   RDW 12.5 10/07/2020 1046   RDW 12.5 10/08/2018 1427   LYMPHSABS 3.9 10/07/2020 1046   LYMPHSABS 2.6 12/22/2015 1017   MONOABS 0.8 10/07/2020 1046   EOSABS 0.3 10/07/2020 1046   EOSABS 0.1 12/22/2015 1017   BASOSABS 0.1 10/07/2020 1046   BASOSABS 0.1 12/22/2015 1017   Lab Results  Component Value Date   LDH 99 10/07/2020   LDH 98 06/09/2020   LDH 127 05/30/2019    DIAGNOSTIC IMAGING:  I have independently reviewed the scans and discussed with the patient. No results found.   ASSESSMENT:  1. Neutrophilic leukocytosis: -She had extensive work-up including negative BCR/ABL by FISH and Jak 2 V617F and reflex mutation testing. -She smokes 1 pack/day for 21 years. -She was thought to have smoking-related leukocytosis. -No systemic steroid use. -I have recommended bone marrow biopsy if there is any significant worsening of her white cell count.  2. Health maintenance: -She had a mammogram in January 2020 which was BI-RADS Category 2.   PLAN:  1. Neutrophilic leukocytosis: -She smokes about three fourths of a pack of  cigarettes per day. -No major B symptoms.  Reviewed CBC from 10/07/2020 which showed improvement in white count 11.4.  Hemoglobin and platelets are normal.  LDH was normal. -RTC 6 months for follow-up.  2.  Right arm swelling: -D-dimer was elevated at 0.78.  She does not have any respiratory symptoms. -Dr. Junius Roads is ordering Doppler to rule out DVT.  Orders placed this encounter:  No orders of the defined types were placed in this encounter.    Derek Jack, MD [place] [phone]   I, Milinda Antis, am acting as a scribe for Dr. Sanda Linger.  I, Derek Jack MD, have reviewed the above documentation for accuracy and completeness, and I agree with the above.

## 2020-11-04 NOTE — Progress Notes (Deleted)
Office Visit Note  Patient: Destiny Orr             Date of Birth: 07-29-1981           MRN: [id number]             PCP: Eunice Blase, MD Referring: Eunice Blase, MD Visit Date: 11/17/2020 Occupation: @GUAROCC @  Subjective:  No chief complaint on file.   History of Present Illness: Destiny Orr is a 40 y.o. female ***   Activities of Daily Living:  Patient reports morning stiffness for *** {minute/hour:19697}.   Patient {ACTIONS;DENIES/REPORTS:21021675::"Denies"} nocturnal pain.  Difficulty dressing/grooming: {ACTIONS;DENIES/REPORTS:21021675::"Denies"} Difficulty climbing stairs: {ACTIONS;DENIES/REPORTS:21021675::"Denies"} Difficulty getting out of chair: {ACTIONS;DENIES/REPORTS:21021675::"Denies"} Difficulty using hands for taps, buttons, cutlery, and/or writing: {ACTIONS;DENIES/REPORTS:21021675::"Denies"}  No Rheumatology ROS completed.   PMFS History:  Patient Active Problem List   Diagnosis Date Noted  . Vitamin D deficiency 01/20/2020  . Hypothyroidism 11/15/2018  . Abnormal Papanicolaou smear of cervix with positive human papilloma virus (HPV) test 10/11/2018  . Goiter 10/08/2018  . Breast tenderness 10/08/2018  . Mass of upper outer quadrant of right breast 10/08/2018  . Encounter for gynecological examination with Papanicolaou smear of cervix 10/08/2018  . Current smoker 07/03/2017  . Leukocytosis 06/18/2017  . Thyroid nodule 12/26/2016  . Neck injury, initial encounter 10/20/2016  . Concussion with loss of consciousness 10/20/2016  . Injury of left shoulder 10/20/2016  . Sinus tachycardia 10/16/2016  . Post concussion syndrome 10/03/2016  . Intractable headache 10/03/2016  . Dizziness 10/03/2016  . Blurry vision, right eye 10/03/2016  . Nodular goiter 06/18/2016  . Fibromyalgia syndrome 06/16/2016  . Raynaud's syndrome without gangrene 06/16/2016  . Numbness and tingling sensation of skin 06/16/2016  . B12 deficiency 06/16/2016  . Dizziness and giddiness  06/16/2016  . Pelvic pain in female 03/03/2016  . Tick bite 03/03/2016  . POTS (postural orthostatic tachycardia syndrome) 02/05/2016  . Abnormal uterine bleeding (AUB) 12/09/2014  . Depression 11/26/2014  . Dysmenorrhea 07/15/2014  . Irregular menstrual bleeding 07/15/2014  . Back pain, chronic 04/02/2013  . Chronic fatigue and malaise 12/24/2012  . Anxiety 12/24/2012  . PTSD (post-traumatic stress disorder) 12/24/2012  . ADD (attention deficit disorder) 12/24/2012  . Contraception 12/24/2012  . Hx of migraine headaches 12/24/2012    Past Medical History:  Diagnosis Date  . Abnormal Papanicolaou smear of cervix with positive human papilloma virus (HPV) test 10/11/2018   LSIL +HPV, get colpo___  . Abnormal uterine bleeding (AUB) 12/09/2014  . Anxiety   . Arthritis   . Depression   . DJD (degenerative joint disease)   . Dumping syndrome   . Dysmenorrhea 07/15/2014  . Fatigue 12/24/2012  . Fibromyalgia diagnosed April 2016  . Headache   . Hx of migraine headaches 12/24/2012  . Inappropriate sinus tachycardia   . Irregular menstrual bleeding 07/15/2014  . Pelvic pain in female 03/03/2016  . Pneumothorax, spontaneous, tension   . Post concussion syndrome   . POTS (postural orthostatic tachycardia syndrome)   . PTSD (post-traumatic stress disorder)   . Scoliosis   . Thyroid disease   . Tick bite 03/03/2016  . Vitamin B 12 deficiency     Family History  Problem Relation Age of Onset  . Hypertension Father   . Atrial fibrillation Father   . Alcohol abuse Father   . Cancer Maternal Grandmother        skin   . Heart disease Maternal Grandmother   . Other Maternal Grandmother  had thyroid removed  . Breast cancer Maternal Grandmother   . Heart disease Paternal Grandfather   . COPD Paternal Grandfather   . Hypertension Paternal Grandfather   . Diabetes Paternal Grandfather   . Stroke Paternal Grandfather   . Cancer Maternal Grandfather        bladder,lung  . Anxiety  disorder Maternal Aunt   . Anxiety disorder Maternal Uncle   . Bipolar disorder Maternal Uncle   . Breast cancer Maternal Uncle        CML  . Leukemia Other   . Colon cancer Other        lung-2 maternal great uncles   Past Surgical History:  Procedure Laterality Date  . bone spurs toes Right   . CESAREAN SECTION    . COLONOSCOPY    . endooscopy    . KNEE SURGERY    . rotate cuff lt arm     Social History   Social History Narrative  . Not on file   Immunization History  Administered Date(s) Administered  . Tdap 02/09/2014     Objective: Vital Signs: There were no vitals taken for this visit.   Physical Exam   Musculoskeletal Exam: ***  CDAI Exam: CDAI Score: -- Patient Global: --; Provider Global: -- Swollen: --; Tender: -- Joint Exam 11/17/2020   No joint exam has been documented for this visit   There is currently no information documented on the homunculus. Go to the Rheumatology activity and complete the homunculus joint exam.  Investigation: No additional findings.  Imaging: No results found.  Recent Labs: Lab Results  Component Value Date   WBC 11.4 (H) 10/07/2020   HGB 13.2 10/07/2020   PLT 336 10/07/2020   NA 137 10/07/2020   K 4.1 10/07/2020   CL 101 10/07/2020   CO2 25 10/07/2020   GLUCOSE 108 (H) 10/07/2020   BUN 7 10/07/2020   CREATININE 0.78 10/07/2020   BILITOT 0.2 (L) 10/07/2020   ALKPHOS 99 10/07/2020   AST 12 (L) 10/07/2020   ALT 12 10/07/2020   PROT 6.3 (L) 10/07/2020   ALBUMIN 3.3 (L) 10/07/2020   CALCIUM 9.3 10/07/2020   GFRAA >60 06/09/2020    Speciality Comments: No specialty comments available.  Procedures:  No procedures performed Allergies: Prozac [fluoxetine], Topiramate, and Lexapro [escitalopram oxalate]   Assessment / Plan:     Visit Diagnoses: Polyarthralgia  Elevated C-reactive protein (CRP)  Raynaud's syndrome without gangrene  Fibromyalgia syndrome  Chronic fatigue and malaise  Vitamin D  deficiency  POTS (postural orthostatic tachycardia syndrome)  Thyroid nodule  Nodular goiter  PTSD (post-traumatic stress disorder)  Hx of migraine headaches  B12 deficiency  Current smoker  Anxiety and depression  Orders: No orders of the defined types were placed in this encounter.  No orders of the defined types were placed in this encounter.   Face-to-face time spent with patient was *** minutes. Greater than 50% of time was spent in counseling and coordination of care.  Follow-Up Instructions: No follow-ups on file.   Ofilia Neas, PA-C  Note - This record has been created using Dragon software.  Chart creation errors have been sought, but may not always  have been located. Such creation errors do not reflect on  the standard of medical care.

## 2020-11-09 ENCOUNTER — Telehealth (INDEPENDENT_AMBULATORY_CARE_PROVIDER_SITE_OTHER): Payer: Medicare Other | Admitting: Psychiatry

## 2020-11-09 ENCOUNTER — Other Ambulatory Visit: Payer: Self-pay

## 2020-11-09 ENCOUNTER — Encounter (HOSPITAL_COMMUNITY): Payer: Self-pay | Admitting: Psychiatry

## 2020-11-09 DIAGNOSIS — F4310 Post-traumatic stress disorder, unspecified: Secondary | ICD-10-CM | POA: Diagnosis not present

## 2020-11-09 DIAGNOSIS — F332 Major depressive disorder, recurrent severe without psychotic features: Secondary | ICD-10-CM

## 2020-11-09 MED ORDER — ALPRAZOLAM 1 MG PO TABS: 1.0000 mg | ORAL_TABLET | Freq: Four times a day (QID) | ORAL | 2 refills | Status: DC

## 2020-11-09 MED ORDER — LITHIUM CARBONATE 300 MG PO TABS: 600.0000 mg | ORAL_TABLET | Freq: Every day | ORAL | 2 refills | Status: DC

## 2020-11-09 MED ORDER — PAROXETINE HCL 20 MG PO TABS: 20.0000 mg | ORAL_TABLET | Freq: Every day | ORAL | 2 refills | Status: DC

## 2020-11-09 MED ORDER — AMPHETAMINE-DEXTROAMPHETAMINE 10 MG PO TABS: 10.0000 mg | ORAL_TABLET | Freq: Three times a day (TID) | ORAL | 0 refills | Status: DC

## 2020-11-09 NOTE — Progress Notes (Signed)
Virtual Visit via Video Note  I connected with Ailin Trussell on 11/09/20 at  1:40 PM EST by a video enabled telemedicine application and verified that I am speaking with the correct person using two identifiers.  Location: Patient:home Provider: home   I discussed the limitations of evaluation and management by telemedicine and the availability of in person appointments. The patient expressed understanding and agreed to proceed.     I discussed the assessment and treatment plan with the patient. The patient was provided an opportunity to ask questions and all were answered. The patient agreed with the plan and demonstrated an understanding of the instructions.   The patient was advised to call back or seek an in-person evaluation if the symptoms worsen or if the condition fails to improve as anticipated.  I provided 15 minutes of non-face-to-face time during this encounter.   [name], MD  [facility] MD/PA/NP OP Progress Note  11/09/2020 2:00 PM Kadince Boxley  MRN:  [id number]  Chief Complaint:  Chief Complaint    Depression; Anxiety; Follow-up     HPI: This patient is a 40 year old separated white female who lives with her parents 1 son and 1 daughter in [place].  She was on disability from [employer] but now get Social Security disability.  The patient returns for follow-up after 4 weeks.  She told me last time she was pretty sure she had coronavirus because her daughter tested positive and she had all the same symptoms.  She never did get the test.  Since then she has had more fatigue more problems with erratic heart rates.  She states that her heart rates have been high in the 170s.  I strongly urged her to call her cardiologist about this.  She is not yet had the assessment for ketamine and she found out her insurance will cover the actual ketamine medication.  I urged her to call the ketamine clinic to find out more about this and see if they can get it authorized.  In the  meantime the patient states that she has been fairly stable although she feels "blah".  She attributes this to the recent Covid . she denies severe depression anxiety and seems to be in better spirits today. Visit Diagnosis:    ICD-10-CM   1. Major depressive disorder, recurrent, severe without psychotic features ([place])  F33.2   2. PTSD (post-traumatic stress disorder)  F43.10     Past Psychiatric History: Long-term outpatient treatment for depression and anxiety  Past Medical History:  Past Medical History:  Diagnosis Date  . Abnormal Papanicolaou smear of cervix with positive human papilloma virus (HPV) test 10/11/2018   LSIL +HPV, get colpo___  . Abnormal uterine bleeding (AUB) 12/09/2014  . Anxiety   . Arthritis   . Depression   . DJD (degenerative joint disease)   . Dumping syndrome   . Dysmenorrhea 07/15/2014  . Fatigue 12/24/2012  . Fibromyalgia diagnosed April 2016  . Headache   . Hx of migraine headaches 12/24/2012  . Inappropriate sinus tachycardia   . Irregular menstrual bleeding 07/15/2014  . Pelvic pain in female 03/03/2016  . Pneumothorax, spontaneous, tension   . Post concussion syndrome   . POTS (postural orthostatic tachycardia syndrome)   . PTSD (post-traumatic stress disorder)   . Scoliosis   . Thyroid disease   . Tick bite 03/03/2016  . Vitamin B 12 deficiency     Past Surgical History:  Procedure Laterality Date  . bone spurs toes Right   .  CESAREAN SECTION    . COLONOSCOPY    . endooscopy    . KNEE SURGERY    . rotate cuff lt arm      Family Psychiatric History: see below  Family History:  Family History  Problem Relation Age of Onset  . Hypertension Father   . Atrial fibrillation Father   . Alcohol abuse Father   . Cancer Maternal Grandmother        skin   . Heart disease Maternal Grandmother   . Other Maternal Grandmother        had thyroid removed  . Breast cancer Maternal Grandmother   . Heart disease Paternal Grandfather   . COPD Paternal  Grandfather   . Hypertension Paternal Grandfather   . Diabetes Paternal Grandfather   . Stroke Paternal Grandfather   . Cancer Maternal Grandfather        bladder,lung  . Anxiety disorder Maternal Aunt   . Anxiety disorder Maternal Uncle   . Bipolar disorder Maternal Uncle   . Breast cancer Maternal Uncle        CML  . Leukemia Other   . Colon cancer Other        lung-2 maternal great uncles    Social History:  Social History   Socioeconomic History  . Marital status: Legally Separated    Spouse name: Not on file  . Number of children: 3  . Years of education: Not on file  . Highest education level: Not on file  Occupational History  . Not on file  Tobacco Use  . Smoking status: Current Every Day Smoker    Packs/day: 1.00    Years: 15.00    Pack years: 15.00    Types: Cigarettes    Start date: 03/13/1997  . Smokeless tobacco: Never Used  . Tobacco comment: "working on it" Was smoking 2.5 packs a day  Vaping Use  . Vaping Use: Never used  Substance and Sexual Activity  . Alcohol use: No  . Drug use: No  . Sexual activity: Yes    Partners: Male    Birth control/protection: None  Other Topics Concern  . Not on file  Social History Narrative  . Not on file   Social Determinants of Health   Financial Resource Strain: Not on file  Food Insecurity: Not on file  Transportation Needs: Not on file  Physical Activity: Not on file  Stress: Not on file  Social Connections: Not on file    Allergies:  Allergies  Allergen Reactions  . Prozac [Fluoxetine]     Suicidal thoughts  . Topiramate Other (See Comments)    Topamax-dizziness  . Lexapro [Escitalopram Oxalate] Other (See Comments)    Flat affect, No emotions    Metabolic Disorder Labs: Lab Results  Component Value Date   HGBA1C 5.4 01/07/2013   MPG 108 01/07/2013   Lab Results  Component Value Date   PROLACTIN 13.2 06/12/2016   Lab Results  Component Value Date   CHOL 171 10/08/2018   TRIG 200 (H)  10/08/2018   HDL 51 10/08/2018   CHOLHDL 3.4 10/08/2018   VLDL 31 01/07/2013   LDLCALC 80 10/08/2018   LDLCALC 90 01/07/2013   Lab Results  Component Value Date   TSH 1.971 10/24/2019   TSH 1.480 03/05/2019    Therapeutic Level Labs: Lab Results  Component Value Date   LITHIUM 0.45 (L) 01/27/2020   LITHIUM 0.24 (L) 09/03/2019   No results found for: VALPROATE No components found for:  CBMZ  Current Medications: Current Outpatient Medications  Medication Sig Dispense Refill  . ALPRAZolam (XANAX) 1 MG tablet Take 1 tablet (1 mg total) by mouth 4 (four) times daily. 120 tablet 2  . amphetamine-dextroamphetamine (ADDERALL) 10 MG tablet Take 1 tablet (10 mg total) by mouth 3 (three) times daily. 90 tablet 0  . Cholecalciferol (VITAMIN D3) 250 MCG (10000 UT) capsule Take 10,000 Units by mouth daily.    . Cyanocobalamin (B-12 PO) Take 1 tablet by mouth daily.    . cyclobenzaprine (FLEXERIL) 5 MG tablet Take 5 mg by mouth 3 (three) times daily as needed.    . desogestrel-ethinyl estradiol (MIRCETTE) 0.15-0.02/0.01 MG (21/5) tablet TAKE (1) TABLET BY MOUTH ONCE DAILY. 28 tablet 12  . diltiazem (CARDIZEM) 60 MG tablet Take 60 mg as needed daily for palpitations 90 tablet 1  . DULoxetine (CYMBALTA) 60 MG capsule Take 1 capsule (60 mg total) by mouth daily. 30 capsule 2  . ibuprofen (ADVIL,MOTRIN) 200 MG tablet Take 400-600 mg by mouth every 6 (six) hours as needed for mild pain or moderate pain.    Marland Kitchen levothyroxine (SYNTHROID) 50 MCG tablet TAKE 1 TABLET BY MOUTH ONCE A DAY. 30 tablet 3  . lithium 300 MG tablet Take 2 tablets (600 mg total) by mouth at bedtime. 60 tablet 2  . midodrine (PROAMATINE) 2.5 MG tablet Take 1 tablet (2.5 mg total) by mouth 2 (two) times daily with a meal. 180 tablet 3  . PARoxetine (PAXIL) 20 MG tablet Take 1 tablet (20 mg total) by mouth daily. 30 tablet 2   No current facility-administered medications for this visit.     Musculoskeletal: Strength & Muscle  Tone: within normal limits Gait & Station: normal Patient leans: N/A  Psychiatric Specialty Exam: Review of Systems  Constitutional: Positive for fatigue.  Neurological: Positive for dizziness, weakness and headaches.  Psychiatric/Behavioral: Positive for dysphoric mood.  All other systems reviewed and are negative.   There were no vitals taken for this visit.There is no height or weight on file to calculate BMI.  General Appearance: Casual and Fairly Groomed  Eye Contact:  Good  Speech:  Clear and Coherent  Volume:  Normal  Mood:  Anxious and Euthymic  Affect:  Appropriate and Congruent  Thought Process:  Goal Directed  Orientation:  Full (Time, Place, and Person)  Thought Content: Rumination   Suicidal Thoughts:  No  Homicidal Thoughts:  No  Memory:  Immediate;   Good Recent;   Good Remote;   Good  Judgement:  Good  Insight:  Fair  Psychomotor Activity:  Decreased  Concentration:  Concentration: Fair and Attention Span: Fair  Recall:  Good  Fund of Knowledge: Good  Language: Good  Akathisia:  No  Handed:  Right  AIMS (if indicated): not done  Assets:  Communication Skills Resilience Social Support Talents/Skills  ADL's:  Intact  Cognition: WNL  Sleep:  Good   Screenings: Mini-Mental   [place] Office Visit from 10/25/2016 in [facility]  Total Score (max 30 points ) 29    PHQ2-9   [place] Visit from 10/08/2018 in [place] from 08/21/2017 in [place] from 07/18/2017 in [facility] Counselor from 07/10/2017 in [place] Office Visit from 07/03/2017 in [facility]  PHQ-2 Total Score 6 4 0 5 0  PHQ-9 Total Score 21 21 -- 22 --       Assessment and Plan: This  patient is a 40 year old female with long-term dysthymia depression and numerous somatic complaints.  She now thinks she may be developing  "long Covid".  This has similar symptoms to what she already has such as chronic headaches fatigue and postural symptoms.  For now however her mood seems to be fairly stable so she will continue Paxil 30 mg at bedtime for depression, lithium 600 mg nightly for mood stabilization, Adderall 10 mg 3 times daily for focus and Xanax 1 mg 4 times daily for anxiety.  She is still going to talk with the ketamine clinic about getting the medication authorized.  She will return to see me in 4 weeks   [name], MD 11/09/2020, 2:00 PM

## 2020-11-11 ENCOUNTER — Other Ambulatory Visit: Payer: Self-pay | Admitting: Family Medicine

## 2020-11-11 DIAGNOSIS — M25422 Effusion, left elbow: Secondary | ICD-10-CM

## 2020-11-11 DIAGNOSIS — M79601 Pain in right arm: Secondary | ICD-10-CM

## 2020-11-11 DIAGNOSIS — M79602 Pain in left arm: Secondary | ICD-10-CM

## 2020-11-11 DIAGNOSIS — M25421 Effusion, right elbow: Secondary | ICD-10-CM

## 2020-11-11 NOTE — Telephone Encounter (Signed)
I called Destiny Orr with [facility], left vm to see if can get pt scheduled. Pending call back.

## 2020-11-15 ENCOUNTER — Ambulatory Visit (HOSPITAL_COMMUNITY): Payer: Medicare Other

## 2020-11-17 ENCOUNTER — Ambulatory Visit: Payer: Medicare Other | Admitting: Rheumatology

## 2020-11-17 DIAGNOSIS — E049 Nontoxic goiter, unspecified: Secondary | ICD-10-CM

## 2020-11-17 DIAGNOSIS — M2550 Pain in unspecified joint: Secondary | ICD-10-CM

## 2020-11-17 DIAGNOSIS — F17200 Nicotine dependence, unspecified, uncomplicated: Secondary | ICD-10-CM

## 2020-11-17 DIAGNOSIS — F32A Depression, unspecified: Secondary | ICD-10-CM

## 2020-11-17 DIAGNOSIS — E559 Vitamin D deficiency, unspecified: Secondary | ICD-10-CM

## 2020-11-17 DIAGNOSIS — R5381 Other malaise: Secondary | ICD-10-CM

## 2020-11-17 DIAGNOSIS — I498 Other specified cardiac arrhythmias: Secondary | ICD-10-CM

## 2020-11-17 DIAGNOSIS — R7982 Elevated C-reactive protein (CRP): Secondary | ICD-10-CM

## 2020-11-17 DIAGNOSIS — Z8669 Personal history of other diseases of the nervous system and sense organs: Secondary | ICD-10-CM

## 2020-11-17 DIAGNOSIS — E538 Deficiency of other specified B group vitamins: Secondary | ICD-10-CM

## 2020-11-17 DIAGNOSIS — F4310 Post-traumatic stress disorder, unspecified: Secondary | ICD-10-CM

## 2020-11-17 DIAGNOSIS — I7300 Raynaud's syndrome without gangrene: Secondary | ICD-10-CM

## 2020-11-17 DIAGNOSIS — E041 Nontoxic single thyroid nodule: Secondary | ICD-10-CM

## 2020-11-17 DIAGNOSIS — M797 Fibromyalgia: Secondary | ICD-10-CM

## 2020-11-18 ENCOUNTER — Encounter: Payer: Self-pay | Admitting: Adult Health

## 2020-11-19 ENCOUNTER — Ambulatory Visit (HOSPITAL_COMMUNITY)
Admission: RE | Admit: 2020-11-19 | Payer: Medicare Other | Source: Ambulatory Visit | Attending: Family Medicine | Admitting: Family Medicine

## 2020-12-01 NOTE — Progress Notes (Deleted)
Office Visit Note  Patient: Destiny Orr             Date of Birth: 01/22/1981           MRN: [id number]             PCP: Eunice Blase, MD Referring: Eunice Blase, MD Visit Date: 12/14/2020 Occupation: @GUAROCC @  Subjective:  No chief complaint on file.   History of Present Illness: Destiny Orr is a 40 y.o. female ***   Activities of Daily Living:  Patient reports morning stiffness for *** {minute/hour:19697}.   Patient {ACTIONS;DENIES/REPORTS:21021675::"Denies"} nocturnal pain.  Difficulty dressing/grooming: {ACTIONS;DENIES/REPORTS:21021675::"Denies"} Difficulty climbing stairs: {ACTIONS;DENIES/REPORTS:21021675::"Denies"} Difficulty getting out of chair: {ACTIONS;DENIES/REPORTS:21021675::"Denies"} Difficulty using hands for taps, buttons, cutlery, and/or writing: {ACTIONS;DENIES/REPORTS:21021675::"Denies"}  No Rheumatology ROS completed.   PMFS History:  Patient Active Problem List   Diagnosis Date Noted  . Vitamin D deficiency 01/20/2020  . Hypothyroidism 11/15/2018  . Abnormal Papanicolaou smear of cervix with positive human papilloma virus (HPV) test 10/11/2018  . Goiter 10/08/2018  . Breast tenderness 10/08/2018  . Mass of upper outer quadrant of right breast 10/08/2018  . Encounter for gynecological examination with Papanicolaou smear of cervix 10/08/2018  . Current smoker 07/03/2017  . Leukocytosis 06/18/2017  . Thyroid nodule 12/26/2016  . Neck injury, initial encounter 10/20/2016  . Concussion with loss of consciousness 10/20/2016  . Injury of left shoulder 10/20/2016  . Sinus tachycardia 10/16/2016  . Post concussion syndrome 10/03/2016  . Intractable headache 10/03/2016  . Dizziness 10/03/2016  . Blurry vision, right eye 10/03/2016  . Nodular goiter 06/18/2016  . Fibromyalgia syndrome 06/16/2016  . Raynaud's syndrome without gangrene 06/16/2016  . Numbness and tingling sensation of skin 06/16/2016  . B12 deficiency 06/16/2016  . Dizziness and giddiness  06/16/2016  . Pelvic pain in female 03/03/2016  . Tick bite 03/03/2016  . POTS (postural orthostatic tachycardia syndrome) 02/05/2016  . Abnormal uterine bleeding (AUB) 12/09/2014  . Depression 11/26/2014  . Dysmenorrhea 07/15/2014  . Irregular menstrual bleeding 07/15/2014  . Back pain, chronic 04/02/2013  . Chronic fatigue and malaise 12/24/2012  . Anxiety 12/24/2012  . PTSD (post-traumatic stress disorder) 12/24/2012  . ADD (attention deficit disorder) 12/24/2012  . Contraception 12/24/2012  . Hx of migraine headaches 12/24/2012    Past Medical History:  Diagnosis Date  . Abnormal Papanicolaou smear of cervix with positive human papilloma virus (HPV) test 10/11/2018   LSIL +HPV, get colpo___  . Abnormal uterine bleeding (AUB) 12/09/2014  . Anxiety   . Arthritis   . Depression   . DJD (degenerative joint disease)   . Dumping syndrome   . Dysmenorrhea 07/15/2014  . Fatigue 12/24/2012  . Fibromyalgia diagnosed April 2016  . Headache   . Hx of migraine headaches 12/24/2012  . Inappropriate sinus tachycardia   . Irregular menstrual bleeding 07/15/2014  . Pelvic pain in female 03/03/2016  . Pneumothorax, spontaneous, tension   . Post concussion syndrome   . POTS (postural orthostatic tachycardia syndrome)   . PTSD (post-traumatic stress disorder)   . Scoliosis   . Thyroid disease   . Tick bite 03/03/2016  . Vitamin B 12 deficiency     Family History  Problem Relation Age of Onset  . Hypertension Father   . Atrial fibrillation Father   . Alcohol abuse Father   . Cancer Maternal Grandmother        skin   . Heart disease Maternal Grandmother   . Other Maternal Grandmother  had thyroid removed  . Breast cancer Maternal Grandmother   . Heart disease Paternal Grandfather   . COPD Paternal Grandfather   . Hypertension Paternal Grandfather   . Diabetes Paternal Grandfather   . Stroke Paternal Grandfather   . Cancer Maternal Grandfather        bladder,lung  . Anxiety  disorder Maternal Aunt   . Anxiety disorder Maternal Uncle   . Bipolar disorder Maternal Uncle   . Breast cancer Maternal Uncle        CML  . Leukemia Other   . Colon cancer Other        lung-2 maternal great uncles   Past Surgical History:  Procedure Laterality Date  . bone spurs toes Right   . CESAREAN SECTION    . COLONOSCOPY    . endooscopy    . KNEE SURGERY    . rotate cuff lt arm     Social History   Social History Narrative  . Not on file   Immunization History  Administered Date(s) Administered  . Tdap 02/09/2014     Objective: Vital Signs: There were no vitals taken for this visit.   Physical Exam   Musculoskeletal Exam: ***  CDAI Exam: CDAI Score: -- Patient Global: --; Provider Global: -- Swollen: --; Tender: -- Joint Exam 12/14/2020   No joint exam has been documented for this visit   There is currently no information documented on the homunculus. Go to the Rheumatology activity and complete the homunculus joint exam.  Investigation: No additional findings.  Imaging: No results found.  Recent Labs: Lab Results  Component Value Date   WBC 11.4 (H) 10/07/2020   HGB 13.2 10/07/2020   PLT 336 10/07/2020   NA 137 10/07/2020   K 4.1 10/07/2020   CL 101 10/07/2020   CO2 25 10/07/2020   GLUCOSE 108 (H) 10/07/2020   BUN 7 10/07/2020   CREATININE 0.78 10/07/2020   BILITOT 0.2 (L) 10/07/2020   ALKPHOS 99 10/07/2020   AST 12 (L) 10/07/2020   ALT 12 10/07/2020   PROT 6.3 (L) 10/07/2020   ALBUMIN 3.3 (L) 10/07/2020   CALCIUM 9.3 10/07/2020   GFRAA >60 06/09/2020    Speciality Comments: No specialty comments available.  Procedures:  No procedures performed Allergies: Prozac [fluoxetine], Topiramate, and Lexapro [escitalopram oxalate]   Assessment / Plan:     Visit Diagnoses: No diagnosis found.  Orders: No orders of the defined types were placed in this encounter.  No orders of the defined types were placed in this  encounter.   Face-to-face time spent with patient was *** minutes. Greater than 50% of time was spent in counseling and coordination of care.  Follow-Up Instructions: No follow-ups on file.   Bo Merino, MD  Note - This record has been created using Editor, commissioning.  Chart creation errors have been sought, but may not always  have been located. Such creation errors do not reflect on  the standard of medical care.

## 2020-12-07 ENCOUNTER — Encounter (HOSPITAL_COMMUNITY): Payer: Self-pay | Admitting: Psychiatry

## 2020-12-07 ENCOUNTER — Other Ambulatory Visit: Payer: Self-pay

## 2020-12-07 ENCOUNTER — Telehealth (INDEPENDENT_AMBULATORY_CARE_PROVIDER_SITE_OTHER): Payer: Medicare Other | Admitting: Psychiatry

## 2020-12-07 DIAGNOSIS — F4310 Post-traumatic stress disorder, unspecified: Secondary | ICD-10-CM

## 2020-12-07 DIAGNOSIS — F332 Major depressive disorder, recurrent severe without psychotic features: Secondary | ICD-10-CM | POA: Diagnosis not present

## 2020-12-07 MED ORDER — AMPHETAMINE-DEXTROAMPHETAMINE 20 MG PO TABS: 20.0000 mg | ORAL_TABLET | Freq: Two times a day (BID) | ORAL | 0 refills | Status: DC

## 2020-12-07 MED ORDER — LITHIUM CARBONATE 300 MG PO TABS: 600.0000 mg | ORAL_TABLET | Freq: Every day | ORAL | 2 refills | Status: DC

## 2020-12-07 MED ORDER — PAROXETINE HCL 20 MG PO TABS
20.0000 mg | ORAL_TABLET | Freq: Every day | ORAL | 2 refills | Status: DC
Start: 2020-12-07 — End: 2021-01-04

## 2020-12-07 MED ORDER — ALPRAZOLAM 1 MG PO TABS: 1.0000 mg | ORAL_TABLET | Freq: Four times a day (QID) | ORAL | 2 refills | Status: DC

## 2020-12-07 MED ORDER — DULOXETINE HCL 60 MG PO CPEP: 60.0000 mg | ORAL_CAPSULE | Freq: Every day | ORAL | 2 refills | Status: DC

## 2020-12-07 NOTE — Progress Notes (Signed)
Virtual Visit via Video Note  I connected with Mahogani Holzmann on 12/07/20 at 11:00 AM EST by a video enabled telemedicine application and verified that I am speaking with the correct person using two identifiers.  Location: Patient: home Provider: office   I discussed the limitations of evaluation and management by telemedicine and the availability of in person appointments. The patient expressed understanding and agreed to proceed.    I discussed the assessment and treatment plan with the patient. The patient was provided an opportunity to ask questions and all were answered. The patient agreed with the plan and demonstrated an understanding of the instructions.   The patient was advised to call back or seek an in-person evaluation if the symptoms worsen or if the condition fails to improve as anticipated.  I provided 15 minutes of non-face-to-face time during this encounter.   [name], MD  [facility] MD/PA/NP OP Progress Note  12/07/2020 11:30 AM Shontel Santee  MRN:  [id number]  Chief Complaint:  Chief Complaint    Anxiety; Depression; Follow-up     HPI: This patient is a 40 year old separated white female who lives with her parents 1 son and 1 daughter in [place].  She was on disability from [employer] but now get Social Security disability.  The patient returns for follow-up after 4 weeks.  She states that she has been upset this week.  She found out 2 days ago that her 11 year old daughter is interested in dating another girl who had been her friend.  She states that this was hyperactive except but she did let her daughter know that she had reservations about it.  She tried to be very supportive.  She is worried about her daughter and how this will be accepted.  I explained that in the current community and most schools this is fairly common place now.  She is worried about her daughter being bullied.  Apparently the daughter also likes boys.  I explained that at age 40 people are  still experimenting with sexuality and this may not be written in stone.  She is also having significant problems with fatigue and difficulty focusing.  The Adderall dosage no longer seems to help so we will increase it.  She claims she is more depressed but I think a lot of it is the situation with the daughter at this point.  She denies suicidal ideation.  I strongly urged her to get back into counseling and she agrees Visit Diagnosis:    ICD-10-CM   1. Major depressive disorder, recurrent, severe without psychotic features ([place])  F33.2   2. PTSD (post-traumatic stress disorder)  F43.10     Past Psychiatric History: Long-term outpatient treatment for depression and anxiety  Past Medical History:  Past Medical History:  Diagnosis Date  . Abnormal Papanicolaou smear of cervix with positive human papilloma virus (HPV) test 10/11/2018   LSIL +HPV, get colpo___  . Abnormal uterine bleeding (AUB) 12/09/2014  . Anxiety   . Arthritis   . Depression   . DJD (degenerative joint disease)   . Dumping syndrome   . Dysmenorrhea 07/15/2014  . Fatigue 12/24/2012  . Fibromyalgia diagnosed April 2016  . Headache   . Hx of migraine headaches 12/24/2012  . Inappropriate sinus tachycardia   . Irregular menstrual bleeding 07/15/2014  . Pelvic pain in female 03/03/2016  . Pneumothorax, spontaneous, tension   . Post concussion syndrome   . POTS (postural orthostatic tachycardia syndrome)   . PTSD (post-traumatic stress disorder)   .  Scoliosis   . Thyroid disease   . Tick bite 03/03/2016  . Vitamin B 12 deficiency     Past Surgical History:  Procedure Laterality Date  . bone spurs toes Right   . CESAREAN SECTION    . COLONOSCOPY    . endooscopy    . KNEE SURGERY    . rotate cuff lt arm      Family Psychiatric History: see below  Family History:  Family History  Problem Relation Age of Onset  . Hypertension Father   . Atrial fibrillation Father   . Alcohol abuse Father   . Cancer Maternal  Grandmother        skin   . Heart disease Maternal Grandmother   . Other Maternal Grandmother        had thyroid removed  . Breast cancer Maternal Grandmother   . Heart disease Paternal Grandfather   . COPD Paternal Grandfather   . Hypertension Paternal Grandfather   . Diabetes Paternal Grandfather   . Stroke Paternal Grandfather   . Cancer Maternal Grandfather        bladder,lung  . Anxiety disorder Maternal Aunt   . Anxiety disorder Maternal Uncle   . Bipolar disorder Maternal Uncle   . Breast cancer Maternal Uncle        CML  . Leukemia Other   . Colon cancer Other        lung-2 maternal great uncles    Social History:  Social History   Socioeconomic History  . Marital status: Legally Separated    Spouse name: Not on file  . Number of children: 3  . Years of education: Not on file  . Highest education level: Not on file  Occupational History  . Not on file  Tobacco Use  . Smoking status: Current Every Day Smoker    Packs/day: 1.00    Years: 15.00    Pack years: 15.00    Types: Cigarettes    Start date: 03/13/1997  . Smokeless tobacco: Never Used  . Tobacco comment: "working on it" Was smoking 2.5 packs a day  Vaping Use  . Vaping Use: Never used  Substance and Sexual Activity  . Alcohol use: No  . Drug use: No  . Sexual activity: Yes    Partners: Male    Birth control/protection: None  Other Topics Concern  . Not on file  Social History Narrative  . Not on file   Social Determinants of Health   Financial Resource Strain: Not on file  Food Insecurity: Not on file  Transportation Needs: Not on file  Physical Activity: Not on file  Stress: Not on file  Social Connections: Not on file    Allergies:  Allergies  Allergen Reactions  . Prozac [Fluoxetine]     Suicidal thoughts  . Topiramate Other (See Comments)    Topamax-dizziness  . Lexapro [Escitalopram Oxalate] Other (See Comments)    Flat affect, No emotions    Metabolic Disorder Labs: Lab  Results  Component Value Date   HGBA1C 5.4 01/07/2013   MPG 108 01/07/2013   Lab Results  Component Value Date   PROLACTIN 13.2 06/12/2016   Lab Results  Component Value Date   CHOL 171 10/08/2018   TRIG 200 (H) 10/08/2018   HDL 51 10/08/2018   CHOLHDL 3.4 10/08/2018   VLDL 31 01/07/2013   LDLCALC 80 10/08/2018   LDLCALC 90 01/07/2013   Lab Results  Component Value Date   TSH 1.971 10/24/2019  TSH 1.480 03/05/2019    Therapeutic Level Labs: Lab Results  Component Value Date   LITHIUM 0.45 (L) 01/27/2020   LITHIUM 0.24 (L) 09/03/2019   No results found for: VALPROATE No components found for:  CBMZ  Current Medications: Current Outpatient Medications  Medication Sig Dispense Refill  . amphetamine-dextroamphetamine (ADDERALL) 20 MG tablet Take 1 tablet (20 mg total) by mouth 2 (two) times daily. 60 tablet 0  . ALPRAZolam (XANAX) 1 MG tablet Take 1 tablet (1 mg total) by mouth 4 (four) times daily. 120 tablet 2  . Cholecalciferol (VITAMIN D3) 250 MCG (10000 UT) capsule Take 10,000 Units by mouth daily.    . Cyanocobalamin (B-12 PO) Take 1 tablet by mouth daily.    . cyclobenzaprine (FLEXERIL) 5 MG tablet Take 5 mg by mouth 3 (three) times daily as needed.    . desogestrel-ethinyl estradiol (MIRCETTE) 0.15-0.02/0.01 MG (21/5) tablet TAKE (1) TABLET BY MOUTH ONCE DAILY. 28 tablet 12  . diltiazem (CARDIZEM) 60 MG tablet Take 60 mg as needed daily for palpitations 90 tablet 1  . DULoxetine (CYMBALTA) 60 MG capsule Take 1 capsule (60 mg total) by mouth daily. 30 capsule 2  . ibuprofen (ADVIL,MOTRIN) 200 MG tablet Take 400-600 mg by mouth every 6 (six) hours as needed for mild pain or moderate pain.    Marland Kitchen levothyroxine (SYNTHROID) 50 MCG tablet TAKE 1 TABLET BY MOUTH ONCE A DAY. 30 tablet 3  . lithium 300 MG tablet Take 2 tablets (600 mg total) by mouth at bedtime. 60 tablet 2  . midodrine (PROAMATINE) 2.5 MG tablet Take 1 tablet (2.5 mg total) by mouth 2 (two) times daily with  a meal. 180 tablet 3  . PARoxetine (PAXIL) 20 MG tablet Take 1 tablet (20 mg total) by mouth daily. 30 tablet 2   No current facility-administered medications for this visit.     Musculoskeletal: Strength & Muscle Tone: within normal limits Gait & Station: normal Patient leans: N/A  Psychiatric Specialty Exam: Review of Systems  Constitutional: Positive for fatigue.  Psychiatric/Behavioral: Positive for decreased concentration and dysphoric mood.    There were no vitals taken for this visit.There is no height or weight on file to calculate BMI.  General Appearance: Casual and Fairly Groomed  Eye Contact:  Good  Speech:  Clear and Coherent  Volume:  Normal  Mood:  Dysphoric  Affect:  Flat  Thought Process:  Goal Directed  Orientation:  Full (Time, Place, and Person)  Thought Content: Rumination   Suicidal Thoughts:  No  Homicidal Thoughts:  No  Memory:  Immediate;   Good Recent;   Good Remote;   Good  Judgement:  Good  Insight:  Fair  Psychomotor Activity:  Decreased  Concentration:  Concentration: Poor and Attention Span: Poor  Recall:  Good  Fund of Knowledge: Good  Language: Good  Akathisia:  No  Handed:  Right  AIMS (if indicated): not done  Assets:  Communication Skills Resilience Social Support Talents/Skills  ADL's:  Intact  Cognition: WNL  Sleep:  Good   Screenings: Mini-Mental   Flowsheet Row Office Visit from 10/25/2016 in [facility] [facility]  Total Score (max 30 points ) 29    PHQ2-9   Flowsheet Row Video Visit from 12/07/2020 in [place] Office Visit from 10/08/2018 in [place] Office Visit from 08/21/2017 in [place] Office Visit from 07/18/2017 in [facility] Counselor from 07/10/2017 in [facility]  PHQ-2 Total  Score 5 6 4  0 5  PHQ-9 Total Score 16 21 21  - 22    Flowsheet Row Video Visit from 12/07/2020 in  [place] No Risk       Assessment and Plan: This patient is a 39 year old female with long-term dysthymia depression and numerous somatic complaints.  She is now very concerned about her daughter's sexuality.  She is also complaining of sleeping too much fatigue and poor focus.  We will increase Adderall to 20 mg twice daily.  For now we will continue Paxil 30 mg at bedtime for depression, lithium 600 mg nightly for mood stabilization and Xanax 1 mg 4 times daily for anxiety.  She is not able to get ketamine treatment because she cannot afford the co-pay.  I will get her back into counseling here.  She will return to see me in 4 weeks   [name], MD 12/07/2020, 11:30 AM

## 2020-12-08 ENCOUNTER — Other Ambulatory Visit: Payer: Self-pay

## 2020-12-08 ENCOUNTER — Ambulatory Visit (INDEPENDENT_AMBULATORY_CARE_PROVIDER_SITE_OTHER): Payer: Medicare Other | Admitting: Clinical

## 2020-12-08 DIAGNOSIS — F4310 Post-traumatic stress disorder, unspecified: Secondary | ICD-10-CM | POA: Diagnosis not present

## 2020-12-08 DIAGNOSIS — F332 Major depressive disorder, recurrent severe without psychotic features: Secondary | ICD-10-CM | POA: Diagnosis not present

## 2020-12-08 NOTE — Progress Notes (Signed)
Virtual Visit via Telephone Note  I connected with Destiny Orr on 12/08/20 at 10:00 AM EST by telephone and verified that I am speaking with the correct person using two identifiers.  Location: Patient: Home Provider: Office   I discussed the limitations, risks, security and privacy concerns of performing an evaluation and management service by telephone and the availability of in person appointments. I also discussed with the patient that there may be a patient responsible charge related to this service. The patient expressed understanding and agreed to proceed.   THERAPIST PROGRESS NOTE  Session Time:10:00AM-10:45AM  Participation Level:Active  Behavioral Response:CasualAlertDepressed  Type of Therapy:Individual Therapy  Treatment Goals addressed:Coping  Interventions:CBT, Motivational Interviewing, Strength-based and Supportive  Summary:Destiny Isleyis a 40 y.o.femalewho presents withDepression and PTSD.The OPT therapist worked with thepatientfor herfirst session re-integrating into outpatient. OPT treatment.The OPT therapist utilized Motivational Interviewing to assist in creating therapeutic repore. The patient in the session was engaged and work in Science writer about hertriggers and symptoms over the past few weeksincludingcar trouble and managing her daughters coming out as Diplomatic Services operational officer. The OPT therapist utilized Public relations account executive Therapy through cognitive restructuring as well ascontinued to try to empower the patientto challenge automatic negative thoughtsanwork with the patient on coping strategies to assist in management ofDepression.The OPT therapist provided ongoing psychoeducation during the session with the patient on Depression.  Suicidal/Homicidal:Nowithout intent/plan  Therapist Response:The OPT therapist worked with the patient for the patients scheduled session. The patient was engaged in hersession and gave feedback in  relation to triggers, symptoms, and behavior responses over the pastfewweeks including the impact ofexternal stressorsThe OPT therapist worked with the patient utilizing an in session Cognitive Behavioral Therapy exercise. The patient was responsive in the session and verbalized, " I amworried because the girl she is dating or whatever is stringing her along and sometimes wont message her back and then Abby gets extremely upset".The OPT therapist worked with the patient on managing her feelings around her daughters relationship. The OPT therapist worked with the patient onimproving her thought processes and managing unproductiveautomaticnegative thoughts..The OPT therapist will continue treatment work with the patient in hernext scheduled session  Plan: Return again in2/3weeks.  Diagnosis:Axis I:Major depressive disorder, recurrent, severe without psychotic featuresandPTSD (post-traumatic stress disorder)  Axis II:No diagnosis   I discussed the assessment and treatment plan with the patient. The patient was provided an opportunity to ask questions and all were answered. The patient agreed with the plan and demonstrated an understanding of the instructions.  The patient was advised to call back or seek an in-person evaluation if the symptoms worsen or if the condition fails to improve as anticipated.  I provided47minutes of non-face-to-face time during this encounter.   Lennox Grumbles, LCSW 12/08/2020

## 2020-12-10 ENCOUNTER — Ambulatory Visit (HOSPITAL_COMMUNITY)
Admission: RE | Admit: 2020-12-10 | Payer: Medicare Other | Source: Ambulatory Visit | Attending: Family Medicine | Admitting: Family Medicine

## 2020-12-14 ENCOUNTER — Ambulatory Visit: Payer: Medicare Other | Admitting: Rheumatology

## 2020-12-23 ENCOUNTER — Telehealth (INDEPENDENT_AMBULATORY_CARE_PROVIDER_SITE_OTHER): Payer: Medicare Other | Admitting: Cardiology

## 2020-12-23 ENCOUNTER — Other Ambulatory Visit: Payer: Self-pay | Admitting: Adult Health

## 2020-12-23 ENCOUNTER — Other Ambulatory Visit: Payer: Self-pay

## 2020-12-23 ENCOUNTER — Encounter: Payer: Self-pay | Admitting: Cardiology

## 2020-12-23 VITALS — Ht 64.5 in | Wt 179.0 lb

## 2020-12-23 DIAGNOSIS — I498 Other specified cardiac arrhythmias: Secondary | ICD-10-CM | POA: Diagnosis not present

## 2020-12-23 DIAGNOSIS — R002 Palpitations: Secondary | ICD-10-CM

## 2020-12-23 DIAGNOSIS — G90A Postural orthostatic tachycardia syndrome (POTS): Secondary | ICD-10-CM

## 2020-12-23 MED ORDER — MIDODRINE HCL 5 MG PO TABS: 5.0000 mg | ORAL_TABLET | Freq: Two times a day (BID) | ORAL | 3 refills | Status: DC

## 2020-12-23 NOTE — Progress Notes (Signed)
Virtual Visit via Telephone Note   This visit type was conducted due to national recommendations for restrictions regarding the COVID-19 Pandemic (e.g. social distancing) in an effort to limit this patient's exposure and mitigate transmission in our community.  Due to her co-morbid illnesses, this patient is at least at moderate risk for complications without adequate follow up.  This format is felt to be most appropriate for this patient at this time.  The patient did not have access to video technology/had technical difficulties with video requiring transitioning to audio format only (telephone).  All issues noted in this document were discussed and addressed.  No physical exam could be performed with this format.  Please refer to the patient's chart for her  consent to telehealth for [facility].    Date:  12/23/2020   ID:  Destiny Orr, DOB 1981-02-11, MRN [id number] The patient was identified using 2 identifiers.  Patient Location: Home Provider Location: Office/Clinic   PCP:  Eunice Blase, [place]  Cardiologist:  Carlyle Dolly, MD  Advanced Practice Provider:  No care team member to display Electrophysiologist:  None  :[id number]}   Evaluation Performed:  Follow-Up Visit  Chief Complaint:  FOllow up visit  History of Present Illness:    Destiny Orr is a 40 y.o. female seen today for follow up of the following medical problems.  1. Palpitations/POTS/Autonomic dysfunction - several year history, previously evaluated by EP - prior monitors have not show arrhythmias, has had sinus tachycardia.  - significant side effects on beta blockers even low doses, has been tried on several in the past. Recent side effects on recent retry of lopressor 12.5mg , did not tolerate.   - labile HR and bp's.  - sbp's 80-180s bps - HRs 60s-170s - taking dilitazem prn - staying well hydrated, gatorade zero - prior LE edema, had been on lasix and this  resolved. Unclear etiology.   - last visit started florinef 0.1mg  daily - was on florinef for a few weeks, reported headaches and nausea. SYmptoms resolved with stopping medicine.  - high heart rates ongoing at times, can have some dizziness as well  - last visit started midodrine 2.5mg  bid  - ongoing palpitations. Often occurs with sitting down. Symptoms last a few minutes. Takes dilt prn, takes about 2 times per week - ongoing dizziness with standing, fatigue.     The patient does not have symptoms concerning for COVID-19 infection (fever, chills, cough, or new shortness of breath).    Past Medical History:  Diagnosis Date  . Abnormal Papanicolaou smear of cervix with positive human papilloma virus (HPV) test 10/11/2018   LSIL +HPV, get colpo___  . Abnormal uterine bleeding (AUB) 12/09/2014  . Anxiety   . Arthritis   . Depression   . DJD (degenerative joint disease)   . Dumping syndrome   . Dysmenorrhea 07/15/2014  . Fatigue 12/24/2012  . Fibromyalgia diagnosed April 2016  . Headache   . Hx of migraine headaches 12/24/2012  . Inappropriate sinus tachycardia   . Irregular menstrual bleeding 07/15/2014  . Pelvic pain in female 03/03/2016  . Pneumothorax, spontaneous, tension   . Post concussion syndrome   . POTS (postural orthostatic tachycardia syndrome)   . PTSD (post-traumatic stress disorder)   . Scoliosis   . Thyroid disease   . Tick bite 03/03/2016  . Vitamin B 12 deficiency    Past Surgical History:  Procedure Laterality Date  . bone spurs toes Right   .  CESAREAN SECTION    . COLONOSCOPY    . endooscopy    . KNEE SURGERY    . rotate cuff lt arm       No outpatient medications have been marked as taking for the 12/23/20 encounter (Appointment) with Arnoldo Lenis, MD.     Allergies:   Prozac [fluoxetine], Topiramate, and Lexapro [escitalopram oxalate]   Social History   Tobacco Use  . Smoking status: Current Every Day Smoker    Packs/day: 1.00     Years: 15.00    Pack years: 15.00    Types: Cigarettes    Start date: 03/13/1997  . Smokeless tobacco: Never Used  . Tobacco comment: "working on it" Was smoking 2.5 packs a day  Vaping Use  . Vaping Use: Never used  Substance Use Topics  . Alcohol use: No  . Drug use: No     Family Hx: The patient's family history includes Alcohol abuse in her father; Anxiety disorder in her maternal aunt and maternal uncle; Atrial fibrillation in her father; Bipolar disorder in her maternal uncle; Breast cancer in her maternal grandmother and maternal uncle; COPD in her paternal grandfather; Cancer in her maternal grandfather and maternal grandmother; Colon cancer in an other family member; Diabetes in her paternal grandfather; Heart disease in her maternal grandmother and paternal grandfather; Hypertension in her father and paternal grandfather; Leukemia in an other family member; Other in her maternal grandmother; Stroke in her paternal grandfather.  ROS:   Please see the history of present illness.     All other systems reviewed and are negative.   Prior CV studies:   The following studies were reviewed today:  12/2015 echo Study Conclusions  - Left ventricle: The cavity size was normal. Wall thickness was  normal. Systolic function was normal. The estimated ejection  fraction was in the range of 60% to 65%. Wall motion was normal;  there were no regional wall motion abnormalities. Left  ventricular diastolic function parameters were normal.  12/2015 Holter monitor 48 hour Holter monitor reviewed. Sinus rhythm and sinus tachycardia noted. Heart rate ranged from 56 bpm up to 143 beats bpm with average heart rate 90 bpm. No sustained arrhythmias. Rare PACs and PVCs/fusion beat. No pauses.     Labs/Other Tests and Data Reviewed:    EKG:  No ECG reviewed.  Recent Labs: 10/07/2020: ALT 12; BUN 7; Creatinine, Ser 0.78; Hemoglobin 13.2; Platelets 336; Potassium 4.1; Sodium 137   Recent  Lipid Panel Lab Results  Component Value Date/Time   CHOL 171 10/08/2018 02:27 PM   TRIG 200 (H) 10/08/2018 02:27 PM   HDL 51 10/08/2018 02:27 PM   CHOLHDL 3.4 10/08/2018 02:27 PM   CHOLHDL 3.2 01/07/2013 09:49 AM   LDLCALC 80 10/08/2018 02:27 PM    Wt Readings from Last 3 Encounters:  08/19/20 184 lb (83.5 kg)  06/24/20 184 lb (83.5 kg)  06/14/20 186 lb 6.4 oz (84.6 kg)     Risk Assessment/Calculations:      Objective:    Vital Signs:   Today's Vitals   12/23/20 0851  Weight: 179 lb (81.2 kg)  Height: 5' 4.5" (1.638 m)   Body mass index is 30.25 kg/m. Normal affect. Normal speech pattern and tone. Comfortable, no apparent distress. No audible signs of sob or wheezing.   ASSESSMENT & PLAN:    1. Palpitatoins/POTS/Autonomic dysfunction -ongoing symptoms - has not tolerated beta blocker previously, did not tolerate low dose florinef recently. She is taking prn diltiazem -  we will increase midodrine to 5mg  bid, given new Rx for compression stockings        COVID-19 Education: The signs and symptoms of COVID-19 were discussed with the patient and how to seek care for testing (follow up with PCP or arrange E-visit).  The importance of social distancing was discussed today.  Time:   Today, I have spent 16 minutes with the patient with telehealth technology discussing the above problems.     Medication Adjustments/Labs and Tests Ordered: Current medicines are reviewed at length with the patient today.  Concerns regarding medicines are outlined above.   Tests Ordered: No orders of the defined types were placed in this encounter.   Medication Changes: No orders of the defined types were placed in this encounter.   Follow Up:  In Person in 6 month(s)  Signed, Carlyle Dolly, MD  12/23/2020 8:10 AM    [place]

## 2020-12-23 NOTE — Patient Instructions (Addendum)
Medication Instructions:  INCREASE Midodrine to 5 mg twice daily Compression Stocking  *If you need a refill on your cardiac medications before your next appointment, please call your pharmacy*   Lab Work: None If you have labs (blood work) drawn today and your tests are completely normal, you will receive your results only by: Marland Kitchen MyChart Message (if you have MyChart) OR . A paper copy in the mail If you have any lab test that is abnormal or we need to change your treatment, we will call you to review the results.   Testing/Procedures: None   Follow-Up: At [facility], you and your health needs are our priority.  As part of our continuing mission to provide you with exceptional heart care, we have created designated Provider Care Teams.  These Care Teams include your primary Cardiologist (physician) and Advanced Practice Providers (APPs -  Physician Assistants and Nurse Practitioners) who all work together to provide you with the care you need, when you need it.  We recommend signing up for the patient portal called "MyChart".  Sign up information is provided on this After Visit Summary.  MyChart is used to connect with patients for Virtual Visits (Telemedicine).  Patients are able to view lab/test results, encounter notes, upcoming appointments, etc.  Non-urgent messages can be sent to your provider as well.   To learn more about what you can do with MyChart, go to [email or web address].    Your next appointment:   6 month(s)  The format for your next appointment:   In Person  Provider:   Carlyle Dolly, MD   Other Instructions

## 2020-12-23 NOTE — Addendum Note (Signed)
Addended by: Christella Scheuermann C on: 12/23/2020 10:36 AM   Modules accepted: Orders

## 2020-12-29 ENCOUNTER — Ambulatory Visit (HOSPITAL_COMMUNITY): Payer: Medicare Other | Admitting: Clinical

## 2020-12-30 ENCOUNTER — Other Ambulatory Visit: Payer: Self-pay

## 2020-12-30 ENCOUNTER — Telehealth (HOSPITAL_COMMUNITY): Payer: Self-pay | Admitting: Clinical

## 2020-12-30 ENCOUNTER — Ambulatory Visit (HOSPITAL_COMMUNITY): Payer: Medicare Other | Admitting: Clinical

## 2020-12-30 NOTE — Telephone Encounter (Signed)
Pt answered and noted she wanted to rescedule

## 2021-01-04 ENCOUNTER — Other Ambulatory Visit: Payer: Self-pay | Admitting: "Endocrinology

## 2021-01-04 ENCOUNTER — Other Ambulatory Visit: Payer: Self-pay

## 2021-01-04 ENCOUNTER — Telehealth (INDEPENDENT_AMBULATORY_CARE_PROVIDER_SITE_OTHER): Payer: Medicare Other | Admitting: Psychiatry

## 2021-01-04 ENCOUNTER — Encounter (HOSPITAL_COMMUNITY): Payer: Self-pay | Admitting: Psychiatry

## 2021-01-04 ENCOUNTER — Telehealth: Payer: Self-pay

## 2021-01-04 DIAGNOSIS — F332 Major depressive disorder, recurrent severe without psychotic features: Secondary | ICD-10-CM | POA: Diagnosis not present

## 2021-01-04 DIAGNOSIS — F4310 Post-traumatic stress disorder, unspecified: Secondary | ICD-10-CM | POA: Diagnosis not present

## 2021-01-04 DIAGNOSIS — E039 Hypothyroidism, unspecified: Secondary | ICD-10-CM

## 2021-01-04 MED ORDER — AMPHETAMINE-DEXTROAMPHETAMINE 20 MG PO TABS: 20.0000 mg | ORAL_TABLET | Freq: Two times a day (BID) | ORAL | 0 refills | Status: DC

## 2021-01-04 MED ORDER — PAROXETINE HCL 20 MG PO TABS: 20.0000 mg | ORAL_TABLET | Freq: Every day | ORAL | 2 refills | Status: DC

## 2021-01-04 MED ORDER — ALPRAZOLAM 1 MG PO TABS: 1.0000 mg | ORAL_TABLET | Freq: Four times a day (QID) | ORAL | 2 refills | Status: DC

## 2021-01-04 MED ORDER — LITHIUM CARBONATE 300 MG PO TABS: 600.0000 mg | ORAL_TABLET | Freq: Every day | ORAL | 2 refills | Status: DC

## 2021-01-04 NOTE — Telephone Encounter (Signed)
Pt needs update lab orders-can you order those?

## 2021-01-04 NOTE — Telephone Encounter (Signed)
Done

## 2021-01-04 NOTE — Telephone Encounter (Signed)
Faxed lab order to AP patients request

## 2021-01-04 NOTE — Progress Notes (Signed)
Virtual Visit via Video Note  I connected with Destiny Orr on 01/04/21 at 11:20 AM EDT by a video enabled telemedicine application and verified that I am speaking with the correct person using two identifiers.  Location: Patient: home Provider: home   I discussed the limitations of evaluation and management by telemedicine and the availability of in person appointments. The patient expressed understanding and agreed to proceed.    I discussed the assessment and treatment plan with the patient. The patient was provided an opportunity to ask questions and all were answered. The patient agreed with the plan and demonstrated an understanding of the instructions.   The patient was advised to call back or seek an in-person evaluation if the symptoms worsen or if the condition fails to improve as anticipated.  I provided 15 minutes of non-face-to-face time during this encounter.   Destiny Spiller, MD  [facility] MD/PA/NP OP Progress Note  01/04/2021 11:53 AM Destiny Orr  MRN:  [id number]  Chief Complaint: Depression, fatigue HPI: This patient is a 40 year old separated white female who lives with her parents 1 son and 1 daughter in [place]. She was on disability from [employer] but now get Social Security disability  Patient returns for follow-up after 4 weeks.  She states that she is having significant fatigue.  She wakes up tired and feels tired most of the day.  The Adderall helps to some degree but then wears off.  I reminded her that she can take it twice a day.  She is somewhat depressed but her main problem right now is a severe lack of energy.  I noticed that she has not had her thyroid levels checked in over a year.  She also is no longer thinking levothyroxine.  I strongly urged her to call her endocrinologist to get all this checked.  We will also check a lithium level.  She is also going to be going to OB/GYN for colposcopy.  She has her self worked up thinking that she could have uterine  cancer etc.  I urged her to not go down this line of thought without more evidence.  She denies thoughts of self-harm or suicidal ideation Visit Diagnosis:    ICD-10-CM   1. Major depressive disorder, recurrent, severe without psychotic features ([place])  F33.2   2. PTSD (post-traumatic stress disorder)  F43.10     Past Psychiatric History: Long-term outpatient treatment for depression and anxiety  Past Medical History:  Past Medical History:  Diagnosis Date  . Abnormal Papanicolaou smear of cervix with positive human papilloma virus (HPV) test 10/11/2018   LSIL +HPV, get colpo___  . Abnormal uterine bleeding (AUB) 12/09/2014  . Anxiety   . Arthritis   . Depression   . DJD (degenerative joint disease)   . Dumping syndrome   . Dysmenorrhea 07/15/2014  . Fatigue 12/24/2012  . Fibromyalgia diagnosed April 2016  . Headache   . Hx of migraine headaches 12/24/2012  . Inappropriate sinus tachycardia   . Irregular menstrual bleeding 07/15/2014  . Pelvic pain in female 03/03/2016  . Pneumothorax, spontaneous, tension   . Post concussion syndrome   . POTS (postural orthostatic tachycardia syndrome)   . PTSD (post-traumatic stress disorder)   . Scoliosis   . Thyroid disease   . Tick bite 03/03/2016  . Vitamin B 12 deficiency     Past Surgical History:  Procedure Laterality Date  . bone spurs toes Right   . CESAREAN SECTION    . COLONOSCOPY    .  endooscopy    . KNEE SURGERY    . rotate cuff lt arm      Family Psychiatric History: see below  Family History:  Family History  Problem Relation Age of Onset  . Hypertension Father   . Atrial fibrillation Father   . Alcohol abuse Father   . Cancer Maternal Grandmother        skin   . Heart disease Maternal Grandmother   . Other Maternal Grandmother        had thyroid removed  . Breast cancer Maternal Grandmother   . Heart disease Paternal Grandfather   . COPD Paternal Grandfather   . Hypertension Paternal Grandfather   . Diabetes  Paternal Grandfather   . Stroke Paternal Grandfather   . Cancer Maternal Grandfather        bladder,lung  . Anxiety disorder Maternal Aunt   . Anxiety disorder Maternal Uncle   . Bipolar disorder Maternal Uncle   . Breast cancer Maternal Uncle        CML  . Leukemia Other   . Colon cancer Other        lung-2 maternal great uncles    Social History:  Social History   Socioeconomic History  . Marital status: Legally Separated    Spouse name: Not on file  . Number of children: 3  . Years of education: Not on file  . Highest education level: Not on file  Occupational History  . Not on file  Tobacco Use  . Smoking status: Current Every Day Smoker    Packs/day: 1.00    Years: 15.00    Pack years: 15.00    Types: Cigarettes    Start date: 03/13/1997  . Smokeless tobacco: Never Used  . Tobacco comment: "working on it" Was smoking 2.5 packs a day  Vaping Use  . Vaping Use: Never used  Substance and Sexual Activity  . Alcohol use: No  . Drug use: No  . Sexual activity: Yes    Partners: Male    Birth control/protection: None  Other Topics Concern  . Not on file  Social History Narrative  . Not on file   Social Determinants of Health   Financial Resource Strain: Not on file  Food Insecurity: Not on file  Transportation Needs: Not on file  Physical Activity: Not on file  Stress: Not on file  Social Connections: Not on file    Allergies:  Allergies  Allergen Reactions  . Prozac [Fluoxetine]     Suicidal thoughts  . Topiramate Other (See Comments)    Topamax-dizziness  . Lexapro [Escitalopram Oxalate] Other (See Comments)    Flat affect, No emotions    Metabolic Disorder Labs: Lab Results  Component Value Date   HGBA1C 5.4 01/07/2013   MPG 108 01/07/2013   Lab Results  Component Value Date   PROLACTIN 13.2 06/12/2016   Lab Results  Component Value Date   CHOL 171 10/08/2018   TRIG 200 (H) 10/08/2018   HDL 51 10/08/2018   CHOLHDL 3.4 10/08/2018    VLDL 31 01/07/2013   LDLCALC 80 10/08/2018   LDLCALC 90 01/07/2013   Lab Results  Component Value Date   TSH 1.971 10/24/2019   TSH 1.480 03/05/2019    Therapeutic Level Labs: Lab Results  Component Value Date   LITHIUM 0.45 (L) 01/27/2020   LITHIUM 0.24 (L) 09/03/2019   No results found for: VALPROATE No components found for:  CBMZ  Current Medications: Current Outpatient Medications  Medication Sig  Dispense Refill  . amphetamine-dextroamphetamine (ADDERALL) 20 MG tablet Take 1 tablet (20 mg total) by mouth 2 (two) times daily. 60 tablet 0  . ALPRAZolam (XANAX) 1 MG tablet Take 1 tablet (1 mg total) by mouth 4 (four) times daily. 120 tablet 2  . amphetamine-dextroamphetamine (ADDERALL) 20 MG tablet Take 1 tablet (20 mg total) by mouth 2 (two) times daily. 60 tablet 0  . Cholecalciferol (VITAMIN D3) 250 MCG (10000 UT) capsule Take 10,000 Units by mouth daily.    . Cyanocobalamin (B-12 PO) Take 1 tablet by mouth daily.    . cyclobenzaprine (FLEXERIL) 5 MG tablet Take 5 mg by mouth 3 (three) times daily as needed.    . desogestrel-ethinyl estradiol (MIRCETTE) 0.15-0.02/0.01 MG (21/5) tablet TAKE (1) TABLET BY MOUTH ONCE DAILY. 28 tablet 12  . diltiazem (CARDIZEM) 60 MG tablet Take 60 mg as needed daily for palpitations 90 tablet 1  . ibuprofen (ADVIL,MOTRIN) 200 MG tablet Take 400-600 mg by mouth every 6 (six) hours as needed for mild pain or moderate pain.    Marland Kitchen levothyroxine (SYNTHROID) 50 MCG tablet TAKE 1 TABLET BY MOUTH ONCE A DAY. 30 tablet 3  . lithium 300 MG tablet Take 2 tablets (600 mg total) by mouth at bedtime. 60 tablet 2  . midodrine (PROAMATINE) 5 MG tablet Take 1 tablet (5 mg total) by mouth in the morning and at bedtime. 180 tablet 3  . PARoxetine (PAXIL) 20 MG tablet Take 1 tablet (20 mg total) by mouth daily. 30 tablet 2   No current facility-administered medications for this visit.     Musculoskeletal: Strength & Muscle Tone: within normal limits Gait &  Station: normal Patient leans: N/A  Psychiatric Specialty Exam: Review of Systems  Constitutional: Positive for fatigue.  Musculoskeletal: Positive for back pain.  Psychiatric/Behavioral: Positive for decreased concentration.  All other systems reviewed and are negative.   There were no vitals taken for this visit.There is no height or weight on file to calculate BMI.  General Appearance: Casual and Fairly Groomed  Eye Contact:  Good  Speech:  Clear and Coherent  Volume:  Normal  Mood:  Anxious  Affect:  Flat  Thought Process:  Goal Directed  Orientation:  Full (Time, Place, and Person)  Thought Content: Rumination   Suicidal Thoughts:  No  Homicidal Thoughts:  No  Memory:  Immediate;   Good Recent;   Good Remote;   Good  Judgement:  Good  Insight:  Fair  Psychomotor Activity:  Decreased  Concentration:  Concentration: Fair and Attention Span: Fair  Recall:  Good  Fund of Knowledge: Good  Language: Good  Akathisia:  No  Handed:  Right  AIMS (if indicated): not done  Assets:  Communication Skills Desire for Improvement Resilience Social Support Talents/Skills  ADL's:  Intact  Cognition: WNL  Sleep:  Good   Screenings: Mini-Mental   Flowsheet Row Office Visit from 10/25/2016 in [facility] [place]  Total Score (max 30 points ) 29    PHQ2-9   Flowsheet Row Video Visit from 01/04/2021 in [facility] Video Visit from 12/07/2020 in [place] Office Visit from 10/08/2018 in [place] Office Visit from 08/21/2017 in [place] Office Visit from 07/18/2017 in [facility]  PHQ-2 Total Score 4 5 6 4  0  PHQ-9 Total Score 15 16 21 21  --    Flowsheet Row Video Visit from 01/04/2021 in [facility] Video Visit from  12/07/2020 in [facility]  C-SSRS RISK CATEGORY No  Risk No Risk       Assessment and Plan: This patient is a 40 year old female with long-term dysthymia depression and numerous somatic complaints.  She continues to complain of severe fatigue.  I urged her to see her endocrinologist as soon as possible.  For now we will continue Adderall 20 mg twice daily and remind her to take it twice a day.  She will continue Paxil 30 mg at bedtime for depression lithium 600 mg at bedtime for mood stabilization and Xanax 1 mg 4 times daily for anxiety.  She will check her lithium level.  She will return to see me in 2 months.   Destiny Spiller, MD 01/04/2021, 11:54 AM

## 2021-01-07 ENCOUNTER — Ambulatory Visit (INDEPENDENT_AMBULATORY_CARE_PROVIDER_SITE_OTHER): Payer: Medicare Other | Admitting: Adult Health

## 2021-01-07 ENCOUNTER — Encounter: Payer: Self-pay | Admitting: Adult Health

## 2021-01-07 ENCOUNTER — Other Ambulatory Visit: Payer: Self-pay | Admitting: Adult Health

## 2021-01-07 ENCOUNTER — Other Ambulatory Visit: Payer: Self-pay

## 2021-01-07 ENCOUNTER — Other Ambulatory Visit (HOSPITAL_COMMUNITY)
Admission: RE | Admit: 2021-01-07 | Discharge: 2021-01-07 | Disposition: A | Discharge status: Home / Self Care | Payer: Medicare Other | Source: Ambulatory Visit | Attending: Adult Health | Admitting: Adult Health

## 2021-01-07 VITALS — BP 128/86 | HR 116 | Ht 64.5 in | Wt 185.0 lb

## 2021-01-07 DIAGNOSIS — Z01419 Encounter for gynecological examination (general) (routine) without abnormal findings: Secondary | ICD-10-CM | POA: Insufficient documentation

## 2021-01-07 DIAGNOSIS — Z13220 Encounter for screening for lipoid disorders: Secondary | ICD-10-CM

## 2021-01-07 DIAGNOSIS — Z1231 Encounter for screening mammogram for malignant neoplasm of breast: Secondary | ICD-10-CM

## 2021-01-07 DIAGNOSIS — Z3041 Encounter for surveillance of contraceptive pills: Secondary | ICD-10-CM | POA: Diagnosis not present

## 2021-01-07 DIAGNOSIS — Z8742 Personal history of other diseases of the female genital tract: Secondary | ICD-10-CM | POA: Diagnosis not present

## 2021-01-07 DIAGNOSIS — F17200 Nicotine dependence, unspecified, uncomplicated: Secondary | ICD-10-CM | POA: Diagnosis not present

## 2021-01-07 DIAGNOSIS — E039 Hypothyroidism, unspecified: Secondary | ICD-10-CM | POA: Diagnosis not present

## 2021-01-07 MED ORDER — NORETHINDRONE 0.35 MG PO TABS: 1.0000 | ORAL_TABLET | Freq: Every day | ORAL | 11 refills | Status: DC

## 2021-01-07 NOTE — Progress Notes (Signed)
Patient ID: Destiny Orr, female   DOB: Jun 10, 1981, 40 y.o.   MRN: [id number] History of Present Illness:  Destiny Orr is a 40 year old white female, separated, G3P2013 in for a well woman gyn exam and pap, her last pap was 10/08/2018 was LSIL with +HPV and she did not come for colpo. She sees Dr Dorris Fetch for thyroid and PCP is Dr Junius Roads and she sees Dr Harrington Challenger.   Current Medications, Allergies, Past Medical History, Past Surgical History, Family History and Social History were reviewed in [employer] record.     Review of Systems:  Patient denies any headaches, hearing loss,  blurred vision, shortness of breath, chest pain, abdominal pain, problems with bowel movements, urination, or intercourse. No joint pain or mood swings. +fatigue has been off meds and just getting back on    Physical Exam:BP 128/86 (BP Location: Left Arm, Patient Position: Sitting, Cuff Size: Normal)   Pulse (!) 116   Ht 5' 4.5" (1.638 m)   Wt 185 lb (83.9 kg)   BMI 31.26 kg/m  General:  Well developed, well nourished, no acute distress Skin:  Warm and dry Neck:  Midline trachea, normal thyroid, good ROM, no lymphadenopathy Lungs; Clear to auscultation bilaterally Breast:  No dominant palpable mass, retraction, or nipple discharge Cardiovascular: Regular rate and rhythm Abdomen:  Soft, non tender, no hepatosplenomegaly Pelvic:  External genitalia is normal in appearance, no lesions.  The vagina is normal in appearance. Urethra has no lesions or masses. The cervix is bulbous and smooth, pap with HR HPV genotyping performed.  Uterus is felt to be normal size, shape, and contour.  No adnexal masses or tenderness noted.Bladder is non tender, no masses felt. Extremities/musculoskeletal:  No swelling or varicosities noted, no clubbing or cyanosis Psych:  No mood changes, alert and cooperative,seems happy AA is 0 Fall risk is low PHQ 9 score is 22, no SI, on meds and sees Dr Harrington Challenger GAD 7 score is 16  [place] -  01/07/21 1038      Pregnancy Intention Screening   Does the patient want to become pregnant in the next year? No    Does the patient's partner want to become pregnant in the next year? No    Would the patient like to discuss contraceptive options today? No      Contraception Wrap Up   Current Method Oral Contraceptive    End Method Oral Contraceptive    Contraception Counseling Provided No          Examination chaperoned by Celene Squibb LPN   Impression and Plan: 1. Encounter for gynecological examination with Papanicolaou smear of cervix Pap sent Physical in 1 year  Pap in 3 if normal Call [facility] for [facility] at 51  2. History of abnormal cervical Pap smear Pap sent   3. Current smoker   4. Encounter for surveillance of contraceptive pills  Stop current Mircette and start Micronor Sunday and use condoms Do not want to take [place] and smoke  Meds ordered this encounter  Medications  . norethindrone (MICRONOR) 0.35 MG tablet    Sig: Take 1 tablet (0.35 mg total) by mouth daily.    Dispense:  28 tablet    Refill:  11    Order Specific Question:   Supervising Provider    Answer:   Elonda Husky, LUTHER H [[id number]]   5. Screening cholesterol level Check lipids

## 2021-01-08 LAB — LIPID PANEL
Chol/HDL Ratio: 2.8 ratio (ref 0.0–4.4)
Cholesterol, Total: 204 mg/dL — ABNORMAL HIGH (ref 100–199)
HDL: 72 mg/dL (ref >39)
LDL Chol Calc (NIH): 109 mg/dL — ABNORMAL HIGH (ref 0–99)
Triglycerides: 132 mg/dL (ref 0–149)
VLDL Cholesterol Cal: 23 mg/dL (ref 5–40)

## 2021-01-08 LAB — TSH: TSH: 4.08 u[IU]/mL (ref 0.450–4.500)

## 2021-01-08 LAB — T4, FREE: Free T4: 1.14 ng/dL (ref 0.82–1.77)

## 2021-01-12 ENCOUNTER — Ambulatory Visit: Payer: Medicare Other | Admitting: "Endocrinology

## 2021-01-12 LAB — CYTOLOGY - PAP
Comment: NEGATIVE
Diagnosis: NEGATIVE
Diagnosis: REACTIVE
High risk HPV: NEGATIVE

## 2021-01-13 ENCOUNTER — Encounter (HOSPITAL_COMMUNITY): Payer: Self-pay

## 2021-01-14 ENCOUNTER — Telehealth (HOSPITAL_COMMUNITY): Payer: Self-pay | Admitting: *Deleted

## 2021-01-14 NOTE — Telephone Encounter (Signed)
Called patient due to not getting her labs completed. Staff was not able to get a hold of of pt and LMOM.

## 2021-01-27 ENCOUNTER — Other Ambulatory Visit: Payer: Self-pay | Admitting: Obstetrics & Gynecology

## 2021-03-01 ENCOUNTER — Other Ambulatory Visit (HOSPITAL_COMMUNITY): Payer: Self-pay | Admitting: Psychiatry

## 2021-03-10 ENCOUNTER — Other Ambulatory Visit: Payer: Self-pay

## 2021-03-10 ENCOUNTER — Telehealth (INDEPENDENT_AMBULATORY_CARE_PROVIDER_SITE_OTHER): Payer: Medicare Other | Admitting: Psychiatry

## 2021-03-10 ENCOUNTER — Encounter (HOSPITAL_COMMUNITY): Payer: Self-pay | Admitting: Psychiatry

## 2021-03-10 DIAGNOSIS — F332 Major depressive disorder, recurrent severe without psychotic features: Secondary | ICD-10-CM | POA: Diagnosis not present

## 2021-03-10 DIAGNOSIS — F4310 Post-traumatic stress disorder, unspecified: Secondary | ICD-10-CM

## 2021-03-10 MED ORDER — ALPRAZOLAM 1 MG PO TABS: 1.0000 mg | ORAL_TABLET | Freq: Four times a day (QID) | ORAL | 2 refills | Status: DC

## 2021-03-10 MED ORDER — AMPHETAMINE-DEXTROAMPHETAMINE 20 MG PO TABS: 20.0000 mg | ORAL_TABLET | Freq: Two times a day (BID) | ORAL | 0 refills | Status: DC

## 2021-03-10 MED ORDER — BUPROPION HCL ER (XL) 150 MG PO TB24
150.0000 mg | ORAL_TABLET | ORAL | 2 refills | Status: DC
Start: 2021-03-10 — End: 2021-05-20

## 2021-03-10 MED ORDER — AMPHETAMINE-DEXTROAMPHETAMINE 20 MG PO TABS: ORAL_TABLET | ORAL | 0 refills | Status: DC

## 2021-03-10 NOTE — Progress Notes (Signed)
Virtual Visit via Telephone Note  I connected with Destiny Orr on 03/10/21 at  2:20 PM EDT by telephone and verified that I am speaking with the correct person using two identifiers.  Location: Patient: home Provider: office   I discussed the limitations, risks, security and privacy concerns of performing an evaluation and management service by telephone and the availability of in person appointments. I also discussed with the patient that there may be a patient responsible charge related to this service. The patient expressed understanding and agreed to proceed.    I discussed the assessment and treatment plan with the patient. The patient was provided an opportunity to ask questions and all were answered. The patient agreed with the plan and demonstrated an understanding of the instructions.   The patient was advised to call back or seek an in-person evaluation if the symptoms worsen or if the condition fails to improve as anticipated.  I provided 15 minutes of non-face-to-face time during this encounter.   Destiny Spiller, MD  [facility] MD/PA/NP OP Progress Note  03/10/2021 2:59 PM [name]  MRN:  [id number]  Chief Complaint:  Chief Complaint   Anxiety; Depression; Follow-up    NOM:VEHM patient is a 40 year old separated white female who lives with her parents 1 son and 1 daughter in [place].  She was on disability from [employer] but now get Social Security disability  The patient returns for follow-up after 2 months.  She states that she had to stop the lithium.  She forgot it for 2 days while on a trip and when she came back and took it it made her very nauseous and she was vomiting.  This happened several times and she finally stopped it.  She sees very little difference in her mood and stopping it.  She still feels tired all the time depressed anxious worried about finances.  She denies being suicidal.  She was seen by gynecology and her Pap smear was normal.  Her thyroid  functions have been normal.  Nevertheless she feels "exhausted".  She gets frustrated because she is not able to finish things she starts.  In terms of the antidepressant she would like to stop the Paxil and says "start over with something else."  She thinks in the past Wellbutrin worked better for her.  I told her that with the Paxil she needs to come off of it gradually and she agrees.  The Adderall continues to help somewhat with her focus and Xanax definitely helps her anxiety.  She has not seen her therapist in a couple of months and this needs to be reinstated Visit Diagnosis:    ICD-10-CM   1. Major depressive disorder, recurrent, severe without psychotic features ([place])  F33.2     2. PTSD (post-traumatic stress disorder)  F43.10       Past Psychiatric History: Long-term outpatient treatment for depression and anxiety  Past Medical History:  Past Medical History:  Diagnosis Date   Abnormal Papanicolaou smear of cervix with positive human papilloma virus (HPV) test 10/11/2018   LSIL +HPV, get colpo___   Abnormal uterine bleeding (AUB) 12/09/2014   Anxiety    Arthritis    Depression    DJD (degenerative joint disease)    Dumping syndrome    Dysmenorrhea 07/15/2014   Fatigue 12/24/2012   Fibromyalgia diagnosed April 2016   Headache    Hx of migraine headaches 12/24/2012   Inappropriate sinus tachycardia    Irregular menstrual bleeding 07/15/2014   Pelvic pain  in female 03/03/2016   Pneumothorax, spontaneous, tension    Post concussion syndrome    POTS (postural orthostatic tachycardia syndrome)    PTSD (post-traumatic stress disorder)    Scoliosis    Thyroid disease    Tick bite 03/03/2016   Vitamin B 12 deficiency     Past Surgical History:  Procedure Laterality Date   bone spurs toes Right    CESAREAN SECTION     COLONOSCOPY     endooscopy     KNEE SURGERY     rotate cuff lt arm      Family Psychiatric History: see below  Family History:  Family History  Problem  Relation Age of Onset   Hypertension Father    Atrial fibrillation Father    Alcohol abuse Father    Cancer Maternal Grandmother        skin    Heart disease Maternal Grandmother    Other Maternal Grandmother        had thyroid removed   Breast cancer Maternal Grandmother    Heart disease Paternal Grandfather    COPD Paternal Grandfather    Hypertension Paternal Grandfather    Diabetes Paternal Grandfather    Stroke Paternal Grandfather    Cancer Maternal Grandfather        bladder,lung   Anxiety disorder Maternal Aunt    Anxiety disorder Maternal Uncle    Bipolar disorder Maternal Uncle    Breast cancer Maternal Uncle        CML   Leukemia Other    Colon cancer Other        lung-2 maternal great uncles    Social History:  Social History   Socioeconomic History   Marital status: Legally Separated    Spouse name: Not on file   Number of children: 3   Years of education: Not on file   Highest education level: Not on file  Occupational History   Not on file  Tobacco Use   Smoking status: Every Day    Packs/day: 1.00    Years: 15.00    Pack years: 15.00    Types: Cigarettes    Start date: 03/13/1997   Smokeless tobacco: Never   Tobacco comments:    "working on it" Was smoking 2.5 packs a day  Vaping Use   Vaping Use: Never used  Substance and Sexual Activity   Alcohol use: No   Drug use: No   Sexual activity: Yes    Partners: Male    Birth control/protection: None, Pill  Other Topics Concern   Not on file  Social History Narrative   Not on file   Social Determinants of Health   Financial Resource Strain: Medium Risk   Difficulty of Paying Living Expenses: Somewhat hard  Food Insecurity: Landscape architect Present   Worried About Charity fundraiser in the Last Year: Sometimes true   Arboriculturist in the Last Year: Never true  Transportation Needs: Unmet Transportation Needs   Lack of Transportation (Medical): Yes   Lack of Transportation (Non-Medical):  Yes  Physical Activity: Inactive   Days of Exercise per Week: 0 days   Minutes of Exercise per Session: 0 min  Stress: Stress Concern Present   Feeling of Stress : Very much  Social Connections: Unknown   Frequency of Communication with Friends and Family: Three times a week   Frequency of Social Gatherings with Friends and Family: Never   Attends Religious Services: Never   Active Member of  Clubs or Organizations: No   Attends Archivist Meetings: Never   Marital Status: Not on file    Allergies:  Allergies  Allergen Reactions   Prozac [Fluoxetine]     Suicidal thoughts   Topiramate Other (See Comments)    Topamax-dizziness   Lexapro [Escitalopram Oxalate] Other (See Comments)    Flat affect, No emotions    Metabolic Disorder Labs: Lab Results  Component Value Date   HGBA1C 5.4 01/07/2013   MPG 108 01/07/2013   Lab Results  Component Value Date   PROLACTIN 13.2 06/12/2016   Lab Results  Component Value Date   CHOL 204 (H) 01/07/2021   TRIG 132 01/07/2021   HDL 72 01/07/2021   CHOLHDL 2.8 01/07/2021   VLDL 31 01/07/2013   LDLCALC 109 (H) 01/07/2021   LDLCALC 80 10/08/2018   Lab Results  Component Value Date   TSH 4.080 01/07/2021   TSH 1.971 10/24/2019    Therapeutic Level Labs: Lab Results  Component Value Date   LITHIUM 0.45 (L) 01/27/2020   LITHIUM 0.24 (L) 09/03/2019   No results found for: VALPROATE No components found for:  CBMZ  Current Medications: Current Outpatient Medications  Medication Sig Dispense Refill   buPROPion (WELLBUTRIN XL) 150 MG 24 hr tablet Take 1 tablet (150 mg total) by mouth every morning. 30 tablet 2   ALPRAZolam (XANAX) 1 MG tablet Take 1 tablet (1 mg total) by mouth 4 (four) times daily. 120 tablet 2   amphetamine-dextroamphetamine (ADDERALL) 20 MG tablet Take 1 tablet (20 mg total) by mouth 2 (two) times daily. 60 tablet 0   amphetamine-dextroamphetamine (ADDERALL) 20 MG tablet TAKE (1) TABLET BY MOUTH TWICE  DAILY. 60 tablet 0   Cholecalciferol (VITAMIN D3) 250 MCG (10000 UT) capsule Take 10,000 Units by mouth daily. (Patient not taking: Reported on 01/07/2021)     Cyanocobalamin (B-12 PO) Take 1 tablet by mouth daily. (Patient not taking: Reported on 01/07/2021)     cyclobenzaprine (FLEXERIL) 5 MG tablet Take 5 mg by mouth 3 (three) times daily as needed.     diltiazem (CARDIZEM) 60 MG tablet Take 60 mg as needed daily for palpitations 90 tablet 1   ibuprofen (ADVIL,MOTRIN) 200 MG tablet Take 400-600 mg by mouth every 6 (six) hours as needed for mild pain or moderate pain.     midodrine (PROAMATINE) 5 MG tablet Take 1 tablet (5 mg total) by mouth in the morning and at bedtime. 180 tablet 3   norethindrone (MICRONOR) 0.35 MG tablet Take 1 tablet (0.35 mg total) by mouth daily. 28 tablet 11   PARoxetine (PAXIL) 20 MG tablet Take 1 tablet (20 mg total) by mouth daily. 30 tablet 2   No current facility-administered medications for this visit.     Musculoskeletal: Strength & Muscle Tone: within normal limits Gait & Station: normal Patient leans: N/A  Psychiatric Specialty Exam: Review of Systems  Constitutional:  Positive for fatigue.  Psychiatric/Behavioral:  Positive for dysphoric mood. The patient is nervous/anxious.    There were no vitals taken for this visit.There is no height or weight on file to calculate BMI.  General Appearance: NA  Eye Contact:  NA  Speech:  Clear and Coherent  Volume:  Normal  Mood:  Anxious and Dysphoric  Affect:  NA  Thought Process:  Goal Directed  Orientation:  Full (Time, Place, and Person)  Thought Content: Rumination   Suicidal Thoughts:  No  Homicidal Thoughts:  No  Memory:  Immediate;  Good Recent;   Good Remote;   Good  Judgement:  Fair  Insight:  Fair  Psychomotor Activity:  Decreased  Concentration:  Concentration: Fair and Attention Span: Fair  Recall:  Good  Fund of Knowledge: Good  Language: Good  Akathisia:  No  Handed:  Right  AIMS (if  indicated): not done  Assets:  Communication Skills Desire for Improvement Resilience Social Support Talents/Skills  ADL's:  Intact  Cognition: WNL  Sleep:  Fair   Screenings: GAD-7    Flowsheet Row Office Visit from 01/07/2021 in [facility]  Total GAD-7 Score 16      [place] Visit from 10/25/2016 in [facility]  Total Score (max 30 points ) 29      PHQ2-9    Flowsheet Row Video Visit from 03/10/2021 in [place] Office Visit from 01/07/2021 in [facility] Video Visit from 01/04/2021 in [facility] Video Visit from 12/07/2020 in [facility] Office Visit from 10/08/2018 in [facility]  PHQ-2 Total Score 3 6 4 5 6   PHQ-9 Total Score 12 22 15 16 21       Flowsheet Row Video Visit from 03/10/2021 in [facility] Video Visit from 01/04/2021 in [facility] Video Visit from 12/07/2020 in [facility] No Risk No Risk No Risk        Assessment and Plan: This patient is a 40 year old female with a history of dysthymia depression and numerous somatic complaints.  She continues to suffer from severe fatigue.  All of her laboratories regarding this have been negative except for vitamin D and she is inadvertently stopped taking it.  I urged her to get back on it.  She would like to change antidepressants and wants to get "totally off of it."  With the Paxil I suggested she go down to 10 mg for a week and then stop it.  She would like to be off of it for 2 weeks and then start Wellbutrin XL 150 mg daily.  She will continue Adderall 20 mg twice daily for ADD and Xanax 1 mg 4 times daily for anxiety.  She needs to resume her therapy and return to see me in 2  months   Destiny Spiller, MD 03/10/2021, 2:59 PM

## 2021-03-14 ENCOUNTER — Ambulatory Visit (HOSPITAL_COMMUNITY): Payer: Self-pay

## 2021-03-31 ENCOUNTER — Other Ambulatory Visit: Payer: Self-pay

## 2021-03-31 ENCOUNTER — Ambulatory Visit (INDEPENDENT_AMBULATORY_CARE_PROVIDER_SITE_OTHER): Payer: Medicare Other | Admitting: Clinical

## 2021-03-31 DIAGNOSIS — F332 Major depressive disorder, recurrent severe without psychotic features: Secondary | ICD-10-CM | POA: Diagnosis not present

## 2021-03-31 DIAGNOSIS — F4310 Post-traumatic stress disorder, unspecified: Secondary | ICD-10-CM

## 2021-03-31 NOTE — Progress Notes (Addendum)
Virtual Visit via Telephone Note   I connected with Destiny Orr on 03/31/21 at 4:00 PM EST by telephone and verified that I am speaking with the correct person using two identifiers.   Location: Patient: Home Provider: Office   I discussed the limitations, risks, security and privacy concerns of performing an evaluation and management service by telephone and the availability of in person appointments. I also discussed with the patient that there may be a patient responsible charge related to this service. The patient expressed understanding and agreed to proceed.     THERAPIST PROGRESS NOTE   Session Time: 4:00PM-4:45PM   Participation Level: Active   Behavioral Response: CasualAlertDepressed   Type of Therapy: Individual Therapy   Treatment Goals addressed: Coping   Interventions: CBT, Motivational Interviewing, Strength-based and Supportive   Summary: Destiny Orr is a 40 y.o. female who presents with Depression and PTSD.The OPT therapist worked with the patient for her first session re-integrating into outpatient. OPT treatment. The OPT therapist utilized Motivational Interviewing to assist in creating therapeutic repore. The patient in the session was engaged and work in collaboration giving feedback about her triggers and symptoms over the past few weeks including learning of the passing away of a friend and having concern around how her friend passed away with some concern of foul play. The OPT therapist utilized Cognitive Behavioral Therapy through cognitive restructuring as well as worked with the patient on not continuing to allow racing thoughts around her friend passing effect the patients well being and worked with the patient in the session on coping strategies to assist in management of Depression. The OPT therapist provided ongoing psychoeducation during the session with the patient on Depression.     Suicidal/Homicidal: Nowithout intent/plan   Therapist Response: The OPT  therapist worked with the patient for the patients scheduled session. The patient was engaged in her session and gave feedback in relation to triggers, symptoms, and behavior responses over the past few weeks including the impact of external stressors The OPT therapist worked with the patient utilizing an in session Cognitive Behavioral Therapy exercise. The patient was responsive in the session and verbalized, " I know I have to try to keep myself from thinking about this and spiriling its all I have been able to think about since I found out and I thought going to the viewing would help give me closure but It didn't". The OPT therapist worked with the patient on managing her feelings around her friend passing despite her additional concern of potential foul play ; the case was legally closed / no autopsy/ and the patients friend was cremated. The OPT therapist worked with the patient on not allowing the event to take her away from her own self care and health.. The OPT therapist will continue treatment work with the patient in her next scheduled session   Plan: Return again in 2/3 weeks.   Diagnosis:      Axis I: Major depressive disorder, recurrent, severe without psychotic features and PTSD (post-traumatic stress disorder)                           Axis II: No diagnosis     I discussed the assessment and treatment plan with the patient. The patient was provided an opportunity to ask questions and all were answered. The patient agreed with the plan and demonstrated an understanding of the instructions.   The patient was advised to call back or seek  an in-person evaluation if the symptoms worsen or if the condition fails to improve as anticipated.   I provided 45 minutes of non-face-to-face time during this encounter.     Lennox Grumbles, LCSW  03/31/2021

## 2021-04-21 ENCOUNTER — Ambulatory Visit (INDEPENDENT_AMBULATORY_CARE_PROVIDER_SITE_OTHER): Payer: Medicare Other | Admitting: Clinical

## 2021-04-21 ENCOUNTER — Other Ambulatory Visit: Payer: Self-pay

## 2021-04-21 DIAGNOSIS — F4310 Post-traumatic stress disorder, unspecified: Secondary | ICD-10-CM

## 2021-04-21 DIAGNOSIS — F332 Major depressive disorder, recurrent severe without psychotic features: Secondary | ICD-10-CM | POA: Diagnosis not present

## 2021-04-21 NOTE — Progress Notes (Signed)
Virtual Visit via Telephone Note   I connected with Destiny Orr on 04/21/21 at 2:00 PM EST by telephone and verified that I am speaking with the correct person using two identifiers.   Location: Patient: Home Provider: Office   I discussed the limitations, risks, security and privacy concerns of performing an evaluation and management service by telephone and the availability of in person appointments. I also discussed with the patient that there may be a patient responsible charge related to this service. The patient expressed understanding and agreed to proceed.     THERAPIST PROGRESS NOTE   Session Time: 2:00PM-2:45PM   Participation Level: Active   Behavioral Response: CasualAlertDepressed   Type of Therapy: Individual Therapy   Treatment Goals addressed: Coping   Interventions: CBT, Motivational Interviewing, Strength-based and Supportive   Summary: Destiny Orr is a 40 y.o. female who presents with Depression and PTSD.The OPT therapist worked with the patient for her first session re-integrating into outpatient. OPT treatment. The OPT therapist utilized Motivational Interviewing to assist in creating therapeutic repore. The patient in the session was engaged and work in collaboration giving feedback about her triggers and symptoms over the past few weeks. The patient spoke about going through a very low cycle for about 5 days and the past 2 days have been noticeably better. The patient was not able to identify a trigger to this cycle she experienced.The OPT therapist utilized Cognitive Behavioral Therapy through cognitive restructuring as well as worked with the patient on self awareness and implementing coping strategies to assist in management of Depression. and reducing the intensity, frequency, and duration The patient was encouraged to get reconnected with a PCP and stay consistent with her health providers recommendations and appointments. The OPT therapist provided ongoing  psychoeducation during the session with the patient on Depression.     Suicidal/Homicidal: Nowithout intent/plan   Therapist Response: The OPT therapist worked with the patient for the patients scheduled session. The patient was engaged in her session and gave feedback in relation to triggers, symptoms, and behavior responses over the past few weeks including the impact of external stressors The OPT therapist worked with the patient utilizing an in session Cognitive Behavioral Therapy exercise. The patient was responsive in the session and verbalized, " I am not sure what triggered this but for 5 days I was lower than I think I have been in a long time all I can think of is the physical pain that I have been in .". The OPT therapist worked with the patient on managing her mood. The OPT therapist worked with the patient on her own self care and health as she noted irratic eating pattern. The patient notes not currently taking any medication to help her manage her Depression. The patient does have a upcoming August 1st with Dr. Harrington Challenger.The OPT therapist did verbalize if the patient feels she is going into crisis to immediately go to the ER. The patient verbalized no current S/I.The OPT therapist will continue treatment work with the patient in her next scheduled session   Plan: Return again in 2/3 weeks.   Diagnosis:      Axis I: Major depressive disorder, recurrent, severe without psychotic features and PTSD (post-traumatic stress disorder)                           Axis II: No diagnosis     I discussed the assessment and treatment plan with the patient. The patient was  provided an opportunity to ask questions and all were answered. The patient agreed with the plan and demonstrated an understanding of the instructions.   The patient was advised to call back or seek an in-person evaluation if the symptoms worsen or if the condition fails to improve as anticipated.   I provided 45 minutes of  non-face-to-face time during this encounter.     Lennox Grumbles, LCSW   04/21/2021

## 2021-05-02 ENCOUNTER — Telehealth (HOSPITAL_COMMUNITY): Payer: Medicare Other | Admitting: Psychiatry

## 2021-05-11 ENCOUNTER — Inpatient Hospital Stay (HOSPITAL_COMMUNITY): Payer: Medicare Other | Attending: Hematology

## 2021-05-11 ENCOUNTER — Ambulatory Visit (HOSPITAL_COMMUNITY): Payer: Self-pay

## 2021-05-12 ENCOUNTER — Ambulatory Visit (HOSPITAL_COMMUNITY): Payer: Self-pay

## 2021-05-18 ENCOUNTER — Ambulatory Visit (HOSPITAL_COMMUNITY): Payer: Medicare Other | Admitting: Hematology

## 2021-05-19 ENCOUNTER — Ambulatory Visit (HOSPITAL_COMMUNITY): Payer: Medicare Other | Admitting: Clinical

## 2021-05-19 ENCOUNTER — Other Ambulatory Visit: Payer: Self-pay

## 2021-05-19 ENCOUNTER — Telehealth (HOSPITAL_COMMUNITY): Payer: Self-pay | Admitting: Clinical

## 2021-05-19 NOTE — Telephone Encounter (Signed)
Pt cancelled at time of appointment and rescheduled for Aug 30th @ 2PM

## 2021-05-20 ENCOUNTER — Other Ambulatory Visit: Payer: Self-pay

## 2021-05-20 ENCOUNTER — Encounter (HOSPITAL_COMMUNITY): Payer: Self-pay | Admitting: Psychiatry

## 2021-05-20 ENCOUNTER — Telehealth (INDEPENDENT_AMBULATORY_CARE_PROVIDER_SITE_OTHER): Payer: Medicare Other | Admitting: Psychiatry

## 2021-05-20 DIAGNOSIS — F332 Major depressive disorder, recurrent severe without psychotic features: Secondary | ICD-10-CM | POA: Diagnosis not present

## 2021-05-20 DIAGNOSIS — F4310 Post-traumatic stress disorder, unspecified: Secondary | ICD-10-CM

## 2021-05-20 MED ORDER — CITALOPRAM HYDROBROMIDE 20 MG PO TABS
20.0000 mg | ORAL_TABLET | Freq: Every day | ORAL | 2 refills | Status: DC
Start: 2021-05-20 — End: 2021-06-17

## 2021-05-20 MED ORDER — ALPRAZOLAM 1 MG PO TABS: 1.0000 mg | ORAL_TABLET | Freq: Four times a day (QID) | ORAL | 2 refills | Status: DC

## 2021-05-20 MED ORDER — AMPHETAMINE-DEXTROAMPHETAMINE 20 MG PO TABS: ORAL_TABLET | ORAL | 0 refills | Status: DC

## 2021-05-20 NOTE — Progress Notes (Signed)
Virtual Visit via Video Note  I connected with Destiny Orr on 05/20/21 at  9:40 AM EDT by a video enabled telemedicine application and verified that I am speaking with the correct person using two identifiers.  Location: Patient: home Provider: home office   I discussed the limitations of evaluation and management by telemedicine and the availability of in person appointments. The patient expressed understanding and agreed to proceed.     I discussed the assessment and treatment plan with the patient. The patient was provided an opportunity to ask questions and all were answered. The patient agreed with the plan and demonstrated an understanding of the instructions.   The patient was advised to call back or seek an in-person evaluation if the symptoms worsen or if the condition fails to improve as anticipated.  I provided 15 minutes of non-face-to-face time during this encounter.   [name], MD  [facility] MD/PA/NP OP Progress Note  05/20/2021 10:09 AM Destiny Orr  MRN:  [id number]  Chief Complaint:  Chief Complaint   Anxiety; Depression; Follow-up    HPI: This patient is a 40 year old separated white female who lives with her parents 1 son and 1 daughter in [place].  She was on disability from [employer] but now get Social Security disability  The patient returns for follow-up after 2 months.  Last time we tried to add Wellbutrin.  She stated it made her very sick and nauseous and she stopped it after 2 days.  However she did not inform me.  She is gone throughout the summer without any antidepressant.  She had a pretty bad depressive episode about 3 weeks ago which lasted about 5 days.  She cannot think of any precipitants for this.  She is feeling somewhat better now but still at her low-grade level of depression low energy anhedonia and not caring about anything.  She still has significant social anxiety.  In looking back through the patient's records she has tried just about  every antidepressant.  She realizes that she needs to get back on something.  She was able to tolerate Celexa and I think we can return to this as it may help her anxiety as well.  The Xanax and helps to some degree.  The Adderall helps somewhat with her energy.  She denies suicidal ideation.  I had tried referring her to a ketamine clinic but she could not afford the co-pay.  I will continue to look for this option for her in the community. Visit Diagnosis:    ICD-10-CM   1. Major depressive disorder, recurrent, severe without psychotic features ([place])  F33.2     2. PTSD (post-traumatic stress disorder)  F43.10       Past Psychiatric History: Long-term outpatient treatment for depression and anxiety  Past Medical History:  Past Medical History:  Diagnosis Date   Abnormal Papanicolaou smear of cervix with positive human papilloma virus (HPV) test 10/11/2018   LSIL +HPV, get colpo___   Abnormal uterine bleeding (AUB) 12/09/2014   Anxiety    Arthritis    Depression    DJD (degenerative joint disease)    Dumping syndrome    Dysmenorrhea 07/15/2014   Fatigue 12/24/2012   Fibromyalgia diagnosed April 2016   Headache    Hx of migraine headaches 12/24/2012   Inappropriate sinus tachycardia    Irregular menstrual bleeding 07/15/2014   Pelvic pain in female 03/03/2016   Pneumothorax, spontaneous, tension    Post concussion syndrome    POTS (postural orthostatic  tachycardia syndrome)    PTSD (post-traumatic stress disorder)    Scoliosis    Thyroid disease    Tick bite 03/03/2016   Vitamin B 12 deficiency     Past Surgical History:  Procedure Laterality Date   bone spurs toes Right    CESAREAN SECTION     COLONOSCOPY     endooscopy     KNEE SURGERY     rotate cuff lt arm      Family Psychiatric History: see below  Family History:  Family History  Problem Relation Age of Onset   Hypertension Father    Atrial fibrillation Father    Alcohol abuse Father    Cancer Maternal Grandmother         skin    Heart disease Maternal Grandmother    Other Maternal Grandmother        had thyroid removed   Breast cancer Maternal Grandmother    Heart disease Paternal Grandfather    COPD Paternal Grandfather    Hypertension Paternal Grandfather    Diabetes Paternal Grandfather    Stroke Paternal Grandfather    Cancer Maternal Grandfather        bladder,lung   Anxiety disorder Maternal Aunt    Anxiety disorder Maternal Uncle    Bipolar disorder Maternal Uncle    Breast cancer Maternal Uncle        CML   Leukemia Other    Colon cancer Other        lung-2 maternal great uncles    Social History:  Social History   Socioeconomic History   Marital status: Legally Separated    Spouse name: Not on file   Number of children: 3   Years of education: Not on file   Highest education level: Not on file  Occupational History   Not on file  Tobacco Use   Smoking status: Every Day    Packs/day: 1.00    Years: 15.00    Pack years: 15.00    Types: Cigarettes    Start date: 03/13/1997   Smokeless tobacco: Never   Tobacco comments:    "working on it" Was smoking 2.5 packs a day  Vaping Use   Vaping Use: Never used  Substance and Sexual Activity   Alcohol use: No   Drug use: No   Sexual activity: Yes    Partners: Male    Birth control/protection: None, Pill  Other Topics Concern   Not on file  Social History Narrative   Not on file   Social Determinants of Health   Financial Resource Strain: Medium Risk   Difficulty of Paying Living Expenses: Somewhat hard  Food Insecurity: Landscape architect Present   Worried About Charity fundraiser in the Last Year: Sometimes true   Arboriculturist in the Last Year: Never true  Transportation Needs: Unmet Transportation Needs   Lack of Transportation (Medical): Yes   Lack of Transportation (Non-Medical): Yes  Physical Activity: Inactive   Days of Exercise per Week: 0 days   Minutes of Exercise per Session: 0 min  Stress: Stress  Concern Present   Feeling of Stress : Very much  Social Connections: Unknown   Frequency of Communication with Friends and Family: Three times a week   Frequency of Social Gatherings with Friends and Family: Never   Attends Religious Services: Never   Marine scientist or Organizations: No   Attends Archivist Meetings: Never   Marital Status: Not on file  Allergies:  Allergies  Allergen Reactions   Prozac [Fluoxetine]     Suicidal thoughts   Topiramate Other (See Comments)    Topamax-dizziness   Lexapro [Escitalopram Oxalate] Other (See Comments)    Flat affect, No emotions    Metabolic Disorder Labs: Lab Results  Component Value Date   HGBA1C 5.4 01/07/2013   MPG 108 01/07/2013   Lab Results  Component Value Date   PROLACTIN 13.2 06/12/2016   Lab Results  Component Value Date   CHOL 204 (H) 01/07/2021   TRIG 132 01/07/2021   HDL 72 01/07/2021   CHOLHDL 2.8 01/07/2021   VLDL 31 01/07/2013   LDLCALC 109 (H) 01/07/2021   LDLCALC 80 10/08/2018   Lab Results  Component Value Date   TSH 4.080 01/07/2021   TSH 1.971 10/24/2019    Therapeutic Level Labs: Lab Results  Component Value Date   LITHIUM 0.45 (L) 01/27/2020   LITHIUM 0.24 (L) 09/03/2019   No results found for: VALPROATE No components found for:  CBMZ  Current Medications: Current Outpatient Medications  Medication Sig Dispense Refill   citalopram (CELEXA) 20 MG tablet Take 1 tablet (20 mg total) by mouth daily. 30 tablet 2   ALPRAZolam (XANAX) 1 MG tablet Take 1 tablet (1 mg total) by mouth 4 (four) times daily. 120 tablet 2   amphetamine-dextroamphetamine (ADDERALL) 20 MG tablet Take 1 tablet (20 mg total) by mouth 2 (two) times daily. 60 tablet 0   amphetamine-dextroamphetamine (ADDERALL) 20 MG tablet TAKE (1) TABLET BY MOUTH TWICE DAILY. 60 tablet 0   Cholecalciferol (VITAMIN D3) 250 MCG (10000 UT) capsule Take 10,000 Units by mouth daily. (Patient not taking: Reported on  01/07/2021)     Cyanocobalamin (B-12 PO) Take 1 tablet by mouth daily. (Patient not taking: Reported on 01/07/2021)     cyclobenzaprine (FLEXERIL) 5 MG tablet Take 5 mg by mouth 3 (three) times daily as needed.     diltiazem (CARDIZEM) 60 MG tablet Take 60 mg as needed daily for palpitations 90 tablet 1   ibuprofen (ADVIL,MOTRIN) 200 MG tablet Take 400-600 mg by mouth every 6 (six) hours as needed for mild pain or moderate pain.     midodrine (PROAMATINE) 5 MG tablet Take 1 tablet (5 mg total) by mouth in the morning and at bedtime. 180 tablet 3   norethindrone (MICRONOR) 0.35 MG tablet Take 1 tablet (0.35 mg total) by mouth daily. 28 tablet 11   No current facility-administered medications for this visit.     Musculoskeletal: Strength & Muscle Tone: within normal limits Gait & Station: normal Patient leans: N/A  Psychiatric Specialty Exam: Review of Systems  Constitutional:  Positive for fatigue.  Psychiatric/Behavioral:  Positive for dysphoric mood. The patient is nervous/anxious.   All other systems reviewed and are negative.  There were no vitals taken for this visit.There is no height or weight on file to calculate BMI.  General Appearance: Casual and Fairly Groomed  Eye Contact:  Good  Speech:  Clear and Coherent  Volume:  Decreased  Mood:  Dysphoric  Affect:  Flat  Thought Process:  Goal Directed  Orientation:  Full (Time, Place, and Person)  Thought Content: Rumination   Suicidal Thoughts:  No  Homicidal Thoughts:  No  Memory:  Immediate;   Good Recent;   Good Remote;   Good  Judgement:  Good  Insight:  Fair  Psychomotor Activity:  Decreased  Concentration:  Concentration: Good and Attention Span: Good  Recall:  Good  Fund  of Knowledge: Good  Language: Good  Akathisia:  No  Handed:  Right  AIMS (if indicated): not done  Assets:  Communication Skills Desire for Improvement Physical Health Resilience Social Support Talents/Skills  ADL's:  Intact  Cognition: WNL   Sleep:  Fair   Screenings: GAD-7    Flowsheet Row Office Visit from 01/07/2021 in [facility]  Total GAD-7 Score 16      Mini-Mental    [place] Visit from 10/25/2016 in [facility] [place]  Total Score (max 30 points ) 29      PHQ2-9    Flowsheet Row Video Visit from 05/20/2021 in [place] Video Visit from 03/10/2021 in [place] Office Visit from 01/07/2021 in [facility] Video Visit from 01/04/2021 in [facility] [place] Video Visit from 12/07/2020 in [facility]  PHQ-2 Total Score '3 3 6 4 5  '$ PHQ-9 Total Score '11 12 22 15 16      '$ Flowsheet Row Video Visit from 05/20/2021 in [place] Video Visit from 03/10/2021 in [place] Video Visit from 01/04/2021 in [place] Error: Question 6 not populated No Risk No Risk        Assessment and Plan: This patient is a 40 year old female with a history of dysthymia depression chronic fatigue and social anxiety.  She is totally off all antidepressants and unfortunately had a episode of significant depression a few weeks ago.  I suggest that she can at least get back on something that we know she can tolerate.  She will restart Celexa 20 mg daily.  She will continue Adderall 20 mg twice daily for ADD and Xanax 1 mg 4 times daily for anxiety.  She will continue her therapy and return to see me in 4 weeks   [name], MD 05/20/2021, 10:09 AM

## 2021-05-31 ENCOUNTER — Other Ambulatory Visit: Payer: Self-pay

## 2021-05-31 ENCOUNTER — Ambulatory Visit (INDEPENDENT_AMBULATORY_CARE_PROVIDER_SITE_OTHER): Payer: Medicare Other | Admitting: Clinical

## 2021-05-31 DIAGNOSIS — F332 Major depressive disorder, recurrent severe without psychotic features: Secondary | ICD-10-CM

## 2021-05-31 DIAGNOSIS — F4310 Post-traumatic stress disorder, unspecified: Secondary | ICD-10-CM | POA: Diagnosis not present

## 2021-05-31 DIAGNOSIS — F411 Generalized anxiety disorder: Secondary | ICD-10-CM | POA: Diagnosis not present
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
## 2021-06-01 ENCOUNTER — Telehealth (HOSPITAL_COMMUNITY): Payer: Self-pay | Admitting: *Deleted

## 2021-06-01 NOTE — Telephone Encounter (Signed)
Per pt as time goes on around 2:30 everyday is when she gets really tired. Per pt is there another medication or script that can help? Per pt her Adderall is wearing off and wants to know if there's a different way of taking it as well.

## 2021-06-01 NOTE — Telephone Encounter (Signed)
She needs to take it first thing in the morning and again in about 4 hours. If this is not lasting through the day she can try something longer acting

## 2021-06-02 ENCOUNTER — Other Ambulatory Visit (HOSPITAL_COMMUNITY): Payer: Self-pay | Admitting: Psychiatry

## 2021-06-02 MED ORDER — LISDEXAMFETAMINE DIMESYLATE 40 MG PO CAPS: 40.0000 mg | ORAL_CAPSULE | ORAL | 0 refills | Status: DC

## 2021-06-02 NOTE — Telephone Encounter (Signed)
Tell her I have sent in Vyvanse 40 mg qam

## 2021-06-02 NOTE — Telephone Encounter (Signed)
Per pt that's what she's been doing.   Per pt she is fine with trying longer acting.

## 2021-06-02 NOTE — Telephone Encounter (Signed)
LMOM

## 2021-06-07 ENCOUNTER — Telehealth (HOSPITAL_COMMUNITY): Payer: Self-pay | Admitting: *Deleted

## 2021-06-07 NOTE — Telephone Encounter (Signed)
She can try taking half of the adderall about an hour before the vyvanse wears off to prolong it

## 2021-06-07 NOTE — Telephone Encounter (Signed)
Patient called stating she is no longer on her Adderall and was changed to Vyvanse. Per pt the Vyvanse is working but once its gone out of her system, it just becomes a serious crash. Per pt to the point she just either falls asleep.

## 2021-06-08 ENCOUNTER — Other Ambulatory Visit: Payer: Self-pay

## 2021-06-08 ENCOUNTER — Inpatient Hospital Stay (HOSPITAL_COMMUNITY): Payer: Medicare Other | Attending: Hematology

## 2021-06-08 DIAGNOSIS — D72828 Other elevated white blood cell count: Secondary | ICD-10-CM | POA: Diagnosis not present

## 2021-06-08 DIAGNOSIS — D729 Disorder of white blood cells, unspecified: Secondary | ICD-10-CM

## 2021-06-08 LAB — CBC WITH DIFFERENTIAL/PLATELET
Abs Immature Granulocytes: 0.05 10*3/uL (ref 0.00–0.07)
Basophils Absolute: 0.1 10*3/uL (ref 0.0–0.1)
Basophils Relative: 1 %
Eosinophils Absolute: 0.1 10*3/uL (ref 0.0–0.5)
Eosinophils Relative: 1 %
HCT: 40.9 % (ref 36.0–46.0)
Hemoglobin: 13.6 g/dL (ref 12.0–15.0)
Immature Granulocytes: 0 %
Lymphocytes Relative: 24 %
Lymphs Abs: 2.8 10*3/uL (ref 0.7–4.0)
MCH: 30.4 pg (ref 26.0–34.0)
MCHC: 33.3 g/dL (ref 30.0–36.0)
MCV: 91.5 fL (ref 80.0–100.0)
Monocytes Absolute: 0.7 10*3/uL (ref 0.1–1.0)
Monocytes Relative: 6 %
Neutro Abs: 7.8 10*3/uL — ABNORMAL HIGH (ref 1.7–7.7)
Neutrophils Relative %: 68 %
Platelets: 300 10*3/uL (ref 150–400)
RBC: 4.47 MIL/uL (ref 3.87–5.11)
RDW: 12.9 % (ref 11.5–15.5)
WBC: 11.6 10*3/uL — ABNORMAL HIGH (ref 4.0–10.5)
nRBC: 0 % (ref 0.0–0.2)

## 2021-06-08 LAB — LACTATE DEHYDROGENASE: LDH: 132 U/L (ref 98–192)

## 2021-06-08 NOTE — Telephone Encounter (Signed)
Informed patient with what provider stated and she verbalized understanding and agreed.

## 2021-06-14 NOTE — Progress Notes (Deleted)
NO SHOW

## 2021-06-15 ENCOUNTER — Ambulatory Visit (HOSPITAL_COMMUNITY): Payer: Medicare Other

## 2021-06-15 ENCOUNTER — Inpatient Hospital Stay (HOSPITAL_COMMUNITY): Payer: Medicare Other | Admitting: Physician Assistant

## 2021-06-17 ENCOUNTER — Other Ambulatory Visit: Payer: Self-pay

## 2021-06-17 ENCOUNTER — Telehealth (INDEPENDENT_AMBULATORY_CARE_PROVIDER_SITE_OTHER): Payer: Medicare Other | Admitting: Psychiatry

## 2021-06-17 ENCOUNTER — Encounter (HOSPITAL_COMMUNITY): Payer: Self-pay | Admitting: Psychiatry

## 2021-06-17 DIAGNOSIS — F332 Major depressive disorder, recurrent severe without psychotic features: Secondary | ICD-10-CM

## 2021-06-17 DIAGNOSIS — F4310 Post-traumatic stress disorder, unspecified: Secondary | ICD-10-CM

## 2021-06-17 DIAGNOSIS — F901 Attention-deficit hyperactivity disorder, predominantly hyperactive type: Secondary | ICD-10-CM

## 2021-06-17 MED ORDER — ALPRAZOLAM 1 MG PO TABS: 1.0000 mg | ORAL_TABLET | Freq: Four times a day (QID) | ORAL | 2 refills | Status: DC

## 2021-06-17 MED ORDER — CITALOPRAM HYDROBROMIDE 20 MG PO TABS: 20.0000 mg | ORAL_TABLET | Freq: Every day | ORAL | 2 refills | Status: DC

## 2021-06-17 MED ORDER — AMPHETAMINE-DEXTROAMPHETAMINE 20 MG PO TABS: ORAL_TABLET | ORAL | 0 refills | Status: DC

## 2021-06-17 NOTE — Progress Notes (Signed)
Virtual Visit via Video Note  I connected with Destiny Orr on 06/17/21 at 11:00 AM EDT by a video enabled telemedicine application and verified that I am speaking with the correct person using two identifiers.  Location: Patient: home Provider home office   I discussed the limitations of evaluation and management by telemedicine and the availability of in person appointments. The patient expressed understanding and agreed to proceed.      I discussed the assessment and treatment plan with the patient. The patient was provided an opportunity to ask questions and all were answered. The patient agreed with the plan and demonstrated an understanding of the instructions.   The patient was advised to call back or seek an in-person evaluation if the symptoms worsen or if the condition fails to improve as anticipated.  I provided 21 minutes of non-face-to-face time during this encounter.   Destiny Spiller, MD  [facility] MD/PA/NP OP Progress Note  06/17/2021 11:15 AM Destiny Orr  MRN:  [id number]  Chief Complaint:  Chief Complaint   Depression; Follow-up; ADD    HPI: This patient is a 40 year old separated white female who lives with her parents 1 son and 1 daughter in [place].  She was on disability from [employer] but now get Social Security disability  The patient returns for follow-up after 4 weeks.  Last time we tried adding Vyvanse to help with her focus and alertness.  She states that it worked for a few hours but then she crashed in the afternoon.  After few days however she started falling asleep after taking it in the mornings.  She is back to taking Adderall 20 mg in the morning and at noon and 10 mg later in the day.  This seems to be working well for her.  She states that her mood is okay but not great.  She denies suicidal ideation.  She does not have a whole lot of interest in doing things but she feels better than she has in the past.  She is eating and sleeping well and denies any  thoughts of self-harm or suicidal ideation.  The Xanax continues to help her anxiety Visit Diagnosis:    ICD-10-CM   1. Major depressive disorder, recurrent, severe without psychotic features ([place])  F33.2     2. PTSD (post-traumatic stress disorder)  F43.10     3. Attention deficit hyperactivity disorder (ADHD), predominantly hyperactive type  F90.1       Past Psychiatric History: Long-term outpatient treatment for depression and anxiety  Past Medical History:  Past Medical History:  Diagnosis Date   Abnormal Papanicolaou smear of cervix with positive human papilloma virus (HPV) test 10/11/2018   LSIL +HPV, get colpo___   Abnormal uterine bleeding (AUB) 12/09/2014   Anxiety    Arthritis    Depression    DJD (degenerative joint disease)    Dumping syndrome    Dysmenorrhea 07/15/2014   Fatigue 12/24/2012   Fibromyalgia diagnosed April 2016   Headache    Hx of migraine headaches 12/24/2012   Inappropriate sinus tachycardia    Irregular menstrual bleeding 07/15/2014   Pelvic pain in female 03/03/2016   Pneumothorax, spontaneous, tension    Post concussion syndrome    POTS (postural orthostatic tachycardia syndrome)    PTSD (post-traumatic stress disorder)    Scoliosis    Thyroid disease    Tick bite 03/03/2016   Vitamin B 12 deficiency     Past Surgical History:  Procedure Laterality Date   bone  spurs toes Right    CESAREAN SECTION     COLONOSCOPY     endooscopy     KNEE SURGERY     rotate cuff lt arm      Family Psychiatric History: see below  Family History:  Family History  Problem Relation Age of Onset   Hypertension Father    Atrial fibrillation Father    Alcohol abuse Father    Cancer Maternal Grandmother        skin    Heart disease Maternal Grandmother    Other Maternal Grandmother        had thyroid removed   Breast cancer Maternal Grandmother    Heart disease Paternal Grandfather    COPD Paternal Grandfather    Hypertension Paternal Grandfather     Diabetes Paternal Grandfather    Stroke Paternal Grandfather    Cancer Maternal Grandfather        bladder,lung   Anxiety disorder Maternal Aunt    Anxiety disorder Maternal Uncle    Bipolar disorder Maternal Uncle    Breast cancer Maternal Uncle        CML   Leukemia Other    Colon cancer Other        lung-2 maternal great uncles    Social History:  Social History   Socioeconomic History   Marital status: Legally Separated    Spouse name: Not on file   Number of children: 3   Years of education: Not on file   Highest education level: Not on file  Occupational History   Not on file  Tobacco Use   Smoking status: Every Day    Packs/day: 1.00    Years: 15.00    Pack years: 15.00    Types: Cigarettes    Start date: 03/13/1997   Smokeless tobacco: Never   Tobacco comments:    "working on it" Was smoking 2.5 packs a day  Vaping Use   Vaping Use: Never used  Substance and Sexual Activity   Alcohol use: No   Drug use: No   Sexual activity: Yes    Partners: Male    Birth control/protection: None, Pill  Other Topics Concern   Not on file  Social History Narrative   Not on file   Social Determinants of Health   Financial Resource Strain: Medium Risk   Difficulty of Paying Living Expenses: Somewhat hard  Food Insecurity: Landscape architect Present   Worried About Charity fundraiser in the Last Year: Sometimes true   Arboriculturist in the Last Year: Never true  Transportation Needs: Unmet Transportation Needs   Lack of Transportation (Medical): Yes   Lack of Transportation (Non-Medical): Yes  Physical Activity: Inactive   Days of Exercise per Week: 0 days   Minutes of Exercise per Session: 0 min  Stress: Stress Concern Present   Feeling of Stress : Very much  Social Connections: Unknown   Frequency of Communication with Friends and Family: Three times a week   Frequency of Social Gatherings with Friends and Family: Never   Attends Religious Services: Never    Marine scientist or Organizations: No   Attends Archivist Meetings: Never   Marital Status: Not on file    Allergies:  Allergies  Allergen Reactions   Prozac [Fluoxetine]     Suicidal thoughts   Topiramate Other (See Comments)    Topamax-dizziness   Lexapro [Escitalopram Oxalate] Other (See Comments)    Flat affect, No emotions  Metabolic Disorder Labs: Lab Results  Component Value Date   HGBA1C 5.4 01/07/2013   MPG 108 01/07/2013   Lab Results  Component Value Date   PROLACTIN 13.2 06/12/2016   Lab Results  Component Value Date   CHOL 204 (H) 01/07/2021   TRIG 132 01/07/2021   HDL 72 01/07/2021   CHOLHDL 2.8 01/07/2021   VLDL 31 01/07/2013   LDLCALC 109 (H) 01/07/2021   LDLCALC 80 10/08/2018   Lab Results  Component Value Date   TSH 4.080 01/07/2021   TSH 1.971 10/24/2019    Therapeutic Level Labs: Lab Results  Component Value Date   LITHIUM 0.45 (L) 01/27/2020   LITHIUM 0.24 (L) 09/03/2019   No results found for: VALPROATE No components found for:  CBMZ  Current Medications: Current Outpatient Medications  Medication Sig Dispense Refill   ALPRAZolam (XANAX) 1 MG tablet Take 1 tablet (1 mg total) by mouth 4 (four) times daily. 120 tablet 2   amphetamine-dextroamphetamine (ADDERALL) 20 MG tablet TAKE (1) TABLET BY MOUTH TWICE DAILY. 60 tablet 0   amphetamine-dextroamphetamine (ADDERALL) 20 MG tablet Take one in the am, one at noon and one half tablet at 4 pm 75 tablet 0   Cholecalciferol (VITAMIN D3) 250 MCG (10000 UT) capsule Take 10,000 Units by mouth daily. (Patient not taking: Reported on 01/07/2021)     citalopram (CELEXA) 20 MG tablet Take 1 tablet (20 mg total) by mouth daily. 30 tablet 2   Cyanocobalamin (B-12 PO) Take 1 tablet by mouth daily. (Patient not taking: Reported on 01/07/2021)     cyclobenzaprine (FLEXERIL) 5 MG tablet Take 5 mg by mouth 3 (three) times daily as needed.     diltiazem (CARDIZEM) 60 MG tablet Take 60 mg as  needed daily for palpitations 90 tablet 1   ibuprofen (ADVIL,MOTRIN) 200 MG tablet Take 400-600 mg by mouth every 6 (six) hours as needed for mild pain or moderate pain.     midodrine (PROAMATINE) 5 MG tablet Take 1 tablet (5 mg total) by mouth in the morning and at bedtime. 180 tablet 3   norethindrone (MICRONOR) 0.35 MG tablet Take 1 tablet (0.35 mg total) by mouth daily. 28 tablet 11   No current facility-administered medications for this visit.     Musculoskeletal: Strength & Muscle Tone: within normal limits Gait & Station: normal Patient leans: N/A  Psychiatric Specialty Exam: Review of Systems  Psychiatric/Behavioral:  Positive for decreased concentration and dysphoric mood.   All other systems reviewed and are negative.  There were no vitals taken for this visit.There is no height or weight on file to calculate BMI.  General Appearance: Casual and Fairly Groomed  Eye Contact:  Good  Speech:  Clear and Coherent  Volume:  Normal  Mood:  Dysphoric  Affect:  Congruent  Thought Process:  Goal Directed  Orientation:  Full (Time, Place, and Person)  Thought Content: Rumination   Suicidal Thoughts:  No  Homicidal Thoughts:  No  Memory:  Immediate;   Good Recent;   Good Remote;   Good  Judgement:  Good  Insight:  Fair  Psychomotor Activity:  Decreased  Concentration:  Concentration: Fair and Attention Span: Fair  Recall:  Good  Fund of Knowledge: Good  Language: Good  Akathisia:  No  Handed:  Right  AIMS (if indicated): not done  Assets:  Communication Skills Desire for Improvement Resilience Social Support Talents/Skills  ADL's:  Intact  Cognition: WNL  Sleep:  Good   Screenings: GAD-7  Flowsheet Row Counselor from 05/31/2021 in [place] Office Visit from 01/07/2021 in [facility]  Total GAD-7 Score 20 [place] Office Visit from 10/25/2016 in [facility] [place]   Total Score (max 30 points ) 29      PHQ2-9    Flowsheet Row Video Visit from 06/17/2021 in [place] Counselor from 05/31/2021 in [place] Video Visit from 05/20/2021 in [place] Video Visit from 03/10/2021 in [place] Office Visit from 01/07/2021 in [facility]  PHQ-2 Total Score '2 6 3 3 6  '$ PHQ-9 Total Score '4 21 11 12 22      '$ Flowsheet Row Video Visit from 06/17/2021 in [place] from 05/31/2021 in [facility] [facility] Video Visit from 05/20/2021 in [place] Error: Question 6 not populated Error: Q3, 4, or 5 should not be populated when Q2 is No Error: Question 6 not populated        Assessment and Plan: This patient is a 40 year old female with a history of dysthymia depression chronic fatigue social anxiety and ADD.  For now her mood seems fairly stable although not great.  I am hoping she can work through some of this at therapy as we have tried numerous medications.  She will continue Celexa 20 mg daily for depression and Xanax 1 mg 4 times daily for anxiety.  She will continue Adderall 20 mg every morning and noon and 10 mg at 4 PM.  This seems to be helping with her focus and alertness.  She will return to see me in 4 weeks   Destiny Spiller, MD 06/17/2021, 11:15 AM

## 2021-06-21 ENCOUNTER — Other Ambulatory Visit: Payer: Self-pay

## 2021-06-21 ENCOUNTER — Telehealth (HOSPITAL_COMMUNITY): Payer: Self-pay | Admitting: Clinical

## 2021-06-21 ENCOUNTER — Ambulatory Visit (HOSPITAL_COMMUNITY): Payer: Medicare Other | Admitting: Clinical

## 2021-06-21 NOTE — Telephone Encounter (Signed)
The patient did not respond to contact attempts and missed their scheduled appointment

## 2021-06-27 ENCOUNTER — Ambulatory Visit: Payer: Self-pay

## 2021-07-14 ENCOUNTER — Telehealth (HOSPITAL_COMMUNITY): Payer: Self-pay | Admitting: *Deleted

## 2021-07-14 MED ORDER — AMPHETAMINE-DEXTROAMPHETAMINE 20 MG PO TABS: ORAL_TABLET | ORAL | 0 refills | Status: DC

## 2021-07-14 NOTE — Telephone Encounter (Signed)
Patient called stating she is going to be out of her Adderall before her next appt with Dr. Harrington Challenger.   Patient would like refill sent to [facility].

## 2021-07-14 NOTE — Addendum Note (Signed)
Addended by: Berniece Andreas T on: 07/14/2021 04:45 PM   Modules accepted: Orders

## 2021-07-14 NOTE — Telephone Encounter (Signed)
Send but with instruction no early refills.

## 2021-07-15 ENCOUNTER — Ambulatory Visit (INDEPENDENT_AMBULATORY_CARE_PROVIDER_SITE_OTHER): Payer: Medicare Other | Admitting: Clinical

## 2021-07-15 ENCOUNTER — Other Ambulatory Visit: Payer: Self-pay

## 2021-07-15 DIAGNOSIS — F332 Major depressive disorder, recurrent severe without psychotic features: Secondary | ICD-10-CM | POA: Diagnosis not present

## 2021-07-15 DIAGNOSIS — F4310 Post-traumatic stress disorder, unspecified: Secondary | ICD-10-CM | POA: Diagnosis not present
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
## 2021-07-20 NOTE — Telephone Encounter (Signed)
Informed patient and she verbalized understanding.  

## 2021-07-22 ENCOUNTER — Encounter (HOSPITAL_COMMUNITY): Payer: Self-pay | Admitting: Psychiatry

## 2021-07-22 ENCOUNTER — Telehealth (INDEPENDENT_AMBULATORY_CARE_PROVIDER_SITE_OTHER): Payer: Medicare Other | Admitting: Psychiatry

## 2021-07-22 ENCOUNTER — Other Ambulatory Visit: Payer: Self-pay

## 2021-07-22 DIAGNOSIS — F901 Attention-deficit hyperactivity disorder, predominantly hyperactive type: Secondary | ICD-10-CM | POA: Diagnosis not present

## 2021-07-22 DIAGNOSIS — F332 Major depressive disorder, recurrent severe without psychotic features: Secondary | ICD-10-CM

## 2021-07-22 DIAGNOSIS — F4310 Post-traumatic stress disorder, unspecified: Secondary | ICD-10-CM

## 2021-07-22 MED ORDER — ALPRAZOLAM 1 MG PO TABS: 1.0000 mg | ORAL_TABLET | Freq: Four times a day (QID) | ORAL | 2 refills | Status: DC

## 2021-07-22 MED ORDER — CITALOPRAM HYDROBROMIDE 20 MG PO TABS: 20.0000 mg | ORAL_TABLET | Freq: Every day | ORAL | 2 refills | Status: DC

## 2021-07-22 MED ORDER — AMPHETAMINE-DEXTROAMPHETAMINE 20 MG PO TABS: ORAL_TABLET | ORAL | 0 refills | Status: DC
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
## 2021-08-01 ENCOUNTER — Telehealth (HOSPITAL_COMMUNITY): Payer: Self-pay | Admitting: Clinical

## 2021-08-01 ENCOUNTER — Other Ambulatory Visit: Payer: Self-pay

## 2021-08-01 ENCOUNTER — Ambulatory Visit (HOSPITAL_COMMUNITY): Payer: Medicare Other | Admitting: Clinical

## 2021-08-01 NOTE — Telephone Encounter (Signed)
The patient did not respond to contact attempts including a left VM.

## 2021-08-10 ENCOUNTER — Telehealth (INDEPENDENT_AMBULATORY_CARE_PROVIDER_SITE_OTHER): Payer: Medicare Other | Admitting: Psychiatry

## 2021-08-10 ENCOUNTER — Encounter (HOSPITAL_COMMUNITY): Payer: Self-pay | Admitting: Psychiatry

## 2021-08-10 ENCOUNTER — Other Ambulatory Visit: Payer: Self-pay

## 2021-08-10 DIAGNOSIS — F4310 Post-traumatic stress disorder, unspecified: Secondary | ICD-10-CM

## 2021-08-10 DIAGNOSIS — F901 Attention-deficit hyperactivity disorder, predominantly hyperactive type: Secondary | ICD-10-CM

## 2021-08-10 DIAGNOSIS — F411 Generalized anxiety disorder: Secondary | ICD-10-CM | POA: Diagnosis not present

## 2021-08-10 DIAGNOSIS — F332 Major depressive disorder, recurrent severe without psychotic features: Secondary | ICD-10-CM | POA: Diagnosis not present

## 2021-08-10 MED ORDER — CITALOPRAM HYDROBROMIDE 20 MG PO TABS: 20.0000 mg | ORAL_TABLET | Freq: Every day | ORAL | 2 refills | Status: DC

## 2021-08-10 MED ORDER — ALPRAZOLAM 1 MG PO TABS: 1.0000 mg | ORAL_TABLET | Freq: Four times a day (QID) | ORAL | 2 refills | Status: DC

## 2021-08-10 MED ORDER — AMPHETAMINE-DEXTROAMPHETAMINE 20 MG PO TABS: ORAL_TABLET | ORAL | 0 refills | Status: DC
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
## 2021-08-31 ENCOUNTER — Ambulatory Visit (INDEPENDENT_AMBULATORY_CARE_PROVIDER_SITE_OTHER): Payer: Medicare Other | Admitting: Clinical

## 2021-08-31 ENCOUNTER — Other Ambulatory Visit: Payer: Self-pay

## 2021-08-31 DIAGNOSIS — F332 Major depressive disorder, recurrent severe without psychotic features: Secondary | ICD-10-CM

## 2021-08-31 DIAGNOSIS — F4310 Post-traumatic stress disorder, unspecified: Secondary | ICD-10-CM | POA: Diagnosis not present
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
## 2021-09-06 ENCOUNTER — Encounter (HOSPITAL_COMMUNITY): Payer: Self-pay | Admitting: Psychiatry

## 2021-09-06 ENCOUNTER — Other Ambulatory Visit: Payer: Self-pay

## 2021-09-06 ENCOUNTER — Encounter (HOSPITAL_COMMUNITY): Payer: Self-pay

## 2021-09-06 ENCOUNTER — Telehealth (INDEPENDENT_AMBULATORY_CARE_PROVIDER_SITE_OTHER): Payer: Medicare Other | Admitting: Psychiatry

## 2021-09-06 DIAGNOSIS — F901 Attention-deficit hyperactivity disorder, predominantly hyperactive type: Secondary | ICD-10-CM

## 2021-09-06 DIAGNOSIS — F4310 Post-traumatic stress disorder, unspecified: Secondary | ICD-10-CM | POA: Diagnosis not present

## 2021-09-06 DIAGNOSIS — F332 Major depressive disorder, recurrent severe without psychotic features: Secondary | ICD-10-CM

## 2021-09-06 MED ORDER — ALPRAZOLAM 1 MG PO TABS: 1.0000 mg | ORAL_TABLET | Freq: Four times a day (QID) | ORAL | 2 refills | Status: DC

## 2021-09-06 MED ORDER — CITALOPRAM HYDROBROMIDE 20 MG PO TABS: 20.0000 mg | ORAL_TABLET | Freq: Two times a day (BID) | ORAL | 2 refills | Status: DC

## 2021-09-06 MED ORDER — AMPHETAMINE-DEXTROAMPHETAMINE 20 MG PO TABS
ORAL_TABLET | ORAL | 0 refills | Status: DC
Start: 2021-09-06 — End: 2021-10-07
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
## 2021-09-14 ENCOUNTER — Other Ambulatory Visit: Payer: Self-pay

## 2021-09-14 ENCOUNTER — Ambulatory Visit (INDEPENDENT_AMBULATORY_CARE_PROVIDER_SITE_OTHER): Payer: Medicare Other | Admitting: Clinical

## 2021-09-14 DIAGNOSIS — F332 Major depressive disorder, recurrent severe without psychotic features: Secondary | ICD-10-CM

## 2021-09-14 DIAGNOSIS — F4310 Post-traumatic stress disorder, unspecified: Secondary | ICD-10-CM | POA: Diagnosis not present
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
## 2021-10-06 ENCOUNTER — Other Ambulatory Visit: Payer: Self-pay

## 2021-10-06 ENCOUNTER — Telehealth (HOSPITAL_COMMUNITY): Payer: Self-pay | Admitting: Clinical

## 2021-10-06 ENCOUNTER — Ambulatory Visit (HOSPITAL_COMMUNITY): Payer: Medicare Other | Admitting: Clinical

## 2021-10-06 NOTE — Telephone Encounter (Signed)
The patient did not respond to contact attempts.  

## 2021-10-07 ENCOUNTER — Other Ambulatory Visit: Payer: Self-pay

## 2021-10-07 ENCOUNTER — Encounter (HOSPITAL_COMMUNITY): Payer: Self-pay | Admitting: Psychiatry

## 2021-10-07 ENCOUNTER — Telehealth (INDEPENDENT_AMBULATORY_CARE_PROVIDER_SITE_OTHER): Payer: Medicare Other | Admitting: Psychiatry

## 2021-10-07 DIAGNOSIS — F4310 Post-traumatic stress disorder, unspecified: Secondary | ICD-10-CM

## 2021-10-07 DIAGNOSIS — F901 Attention-deficit hyperactivity disorder, predominantly hyperactive type: Secondary | ICD-10-CM | POA: Diagnosis not present

## 2021-10-07 DIAGNOSIS — F332 Major depressive disorder, recurrent severe without psychotic features: Secondary | ICD-10-CM | POA: Diagnosis not present

## 2021-10-07 MED ORDER — AMPHETAMINE-DEXTROAMPHETAMINE 20 MG PO TABS: 20.0000 mg | ORAL_TABLET | Freq: Three times a day (TID) | ORAL | 0 refills | Status: DC

## 2021-10-07 MED ORDER — ALPRAZOLAM 1 MG PO TABS: 1.0000 mg | ORAL_TABLET | Freq: Four times a day (QID) | ORAL | 2 refills | Status: DC

## 2021-10-07 MED ORDER — CITALOPRAM HYDROBROMIDE 20 MG PO TABS: 20.0000 mg | ORAL_TABLET | Freq: Two times a day (BID) | ORAL | 2 refills | Status: DC

## 2021-10-07 NOTE — Progress Notes (Signed)
Virtual Visit via Video Note  I connected with Breckyn Harbin on 10/07/21 at 11:20 AM EST by a video enabled telemedicine application and verified that I am speaking with the correct person using two identifiers.  Location: Patient: home Provider: home office   I discussed the limitations of evaluation and management by telemedicine and the availability of in person appointments. The patient expressed understanding and agreed to proceed.     I discussed the assessment and treatment plan with the patient. The patient was provided an opportunity to ask questions and all were answered. The patient agreed with the plan and demonstrated an understanding of the instructions.   The patient was advised to call back or seek an in-person evaluation if the symptoms worsen or if the condition fails to improve as anticipated.  I provided 15 minutes of non-face-to-face time during this encounter.   [name], MD  [facility] MD/PA/NP OP Progress Note  10/07/2021 11:55 AM Edona Schreffler  MRN:  [id number]  Chief Complaint:  Chief Complaint   Anxiety; Depression; Follow-up    HPI: This patient is a 41 year old separated white female who lives with her parents 1 son and 1 daughter in [place].  She was on disability from [employer] but now get Social Security disability  Patient returns for follow-up after 4 weeks.  She states she is now living with her ex-husband with her children.  Her older daughter and the daughter's boyfriend and dog have moved in which is caused a lot of chaos.  She feels tired and stressed but not quite as depressed as she has been in the past.  She states the Adderall is really helping but she might need to go up to 3 a day.  She denies thoughts of self-harm or suicide at this point.   Visit Diagnosis:    ICD-10-CM   1. Major depressive disorder, recurrent, severe without psychotic features ([place])  F33.2     2. PTSD (post-traumatic stress disorder)  F43.10     3. Attention  deficit hyperactivity disorder (ADHD), predominantly hyperactive type  F90.1       Past Psychiatric History: Long-term outpatient treatment for depression and anxiety  Past Medical History:  Past Medical History:  Diagnosis Date   Abnormal Papanicolaou smear of cervix with positive human papilloma virus (HPV) test 10/11/2018   LSIL +HPV, get colpo___   Abnormal uterine bleeding (AUB) 12/09/2014   Anxiety    Arthritis    Depression    DJD (degenerative joint disease)    Dumping syndrome    Dysmenorrhea 07/15/2014   Fatigue 12/24/2012   Fibromyalgia diagnosed April 2016   Headache    Hx of migraine headaches 12/24/2012   Inappropriate sinus tachycardia    Irregular menstrual bleeding 07/15/2014   Pelvic pain in female 03/03/2016   Pneumothorax, spontaneous, tension    Post concussion syndrome    POTS (postural orthostatic tachycardia syndrome)    PTSD (post-traumatic stress disorder)    Scoliosis    Thyroid disease    Tick bite 03/03/2016   Vitamin B 12 deficiency     Past Surgical History:  Procedure Laterality Date   bone spurs toes Right    CESAREAN SECTION     COLONOSCOPY     endooscopy     KNEE SURGERY     rotate cuff lt arm      Family Psychiatric History: See below  Family History:  Family History  Problem Relation Age of Onset   Hypertension Father  Atrial fibrillation Father    Alcohol abuse Father    Cancer Maternal Grandmother        skin    Heart disease Maternal Grandmother    Other Maternal Grandmother        had thyroid removed   Breast cancer Maternal Grandmother    Heart disease Paternal Grandfather    COPD Paternal Grandfather    Hypertension Paternal Grandfather    Diabetes Paternal Grandfather    Stroke Paternal Grandfather    Cancer Maternal Grandfather        bladder,lung   Anxiety disorder Maternal Aunt    Anxiety disorder Maternal Uncle    Bipolar disorder Maternal Uncle    Breast cancer Maternal Uncle        CML   Leukemia Other     Colon cancer Other        lung-2 maternal great uncles    Social History:  Social History   Socioeconomic History   Marital status: Legally Separated    Spouse name: Not on file   Number of children: 3   Years of education: Not on file   Highest education level: Not on file  Occupational History   Not on file  Tobacco Use   Smoking status: Every Day    Packs/day: 1.00    Years: 15.00    Pack years: 15.00    Types: Cigarettes    Start date: 03/13/1997   Smokeless tobacco: Never   Tobacco comments:    "working on it" Was smoking 2.5 packs a day  Vaping Use   Vaping Use: Never used  Substance and Sexual Activity   Alcohol use: No   Drug use: No   Sexual activity: Yes    Partners: Male    Birth control/protection: None, Pill  Other Topics Concern   Not on file  Social History Narrative   Not on file   Social Determinants of Health   Financial Resource Strain: Medium Risk   Difficulty of Paying Living Expenses: Somewhat hard  Food Insecurity: Landscape architect Present   Worried About Charity fundraiser in the Last Year: Sometimes true   Arboriculturist in the Last Year: Never true  Transportation Needs: Unmet Transportation Needs   Lack of Transportation (Medical): Yes   Lack of Transportation (Non-Medical): Yes  Physical Activity: Inactive   Days of Exercise per Week: 0 days   Minutes of Exercise per Session: 0 min  Stress: Stress Concern Present   Feeling of Stress : Very much  Social Connections: Unknown   Frequency of Communication with Friends and Family: Three times a week   Frequency of Social Gatherings with Friends and Family: Never   Attends Religious Services: Never   Marine scientist or Organizations: No   Attends Archivist Meetings: Never   Marital Status: Not on file    Allergies:  Allergies  Allergen Reactions   Prozac [Fluoxetine]     Suicidal thoughts   Topiramate Other (See Comments)    Topamax-dizziness   Lexapro  [Escitalopram Oxalate] Other (See Comments)    Flat affect, No emotions    Metabolic Disorder Labs: Lab Results  Component Value Date   HGBA1C 5.4 01/07/2013   MPG 108 01/07/2013   Lab Results  Component Value Date   PROLACTIN 13.2 06/12/2016   Lab Results  Component Value Date   CHOL 204 (H) 01/07/2021   TRIG 132 01/07/2021   HDL 72 01/07/2021   CHOLHDL  2.8 01/07/2021   VLDL 31 01/07/2013   LDLCALC 109 (H) 01/07/2021   LDLCALC 80 10/08/2018   Lab Results  Component Value Date   TSH 4.080 01/07/2021   TSH 1.971 10/24/2019    Therapeutic Level Labs: Lab Results  Component Value Date   LITHIUM 0.45 (L) 01/27/2020   LITHIUM 0.24 (L) 09/03/2019   No results found for: VALPROATE No components found for:  CBMZ  Current Medications: Current Outpatient Medications  Medication Sig Dispense Refill   ALPRAZolam (XANAX) 1 MG tablet Take 1 tablet (1 mg total) by mouth 4 (four) times daily. 120 tablet 2   amphetamine-dextroamphetamine (ADDERALL) 20 MG tablet Take 1 tablet (20 mg total) by mouth 3 (three) times daily. 90 tablet 0   Cholecalciferol (VITAMIN D3) 250 MCG (10000 UT) capsule Take 10,000 Units by mouth daily. (Patient not taking: Reported on 01/07/2021)     citalopram (CELEXA) 20 MG tablet Take 1 tablet (20 mg total) by mouth 2 (two) times daily. 60 tablet 2   Cyanocobalamin (B-12 PO) Take 1 tablet by mouth daily. (Patient not taking: Reported on 01/07/2021)     cyclobenzaprine (FLEXERIL) 5 MG tablet Take 5 mg by mouth 3 (three) times daily as needed.     diltiazem (CARDIZEM) 60 MG tablet Take 60 mg as needed daily for palpitations 90 tablet 1   ibuprofen (ADVIL,MOTRIN) 200 MG tablet Take 400-600 mg by mouth every 6 (six) hours as needed for mild pain or moderate pain.     midodrine (PROAMATINE) 5 MG tablet Take 1 tablet (5 mg total) by mouth in the morning and at bedtime. 180 tablet 3   norethindrone (MICRONOR) 0.35 MG tablet Take 1 tablet (0.35 mg total) by mouth daily.  28 tablet 11   No current facility-administered medications for this visit.     Musculoskeletal: Strength & Muscle Tone: within normal limits Gait & Station: normal Patient leans: N/A  Psychiatric Specialty Exam: Review of Systems  Constitutional:  Positive for fatigue.  Psychiatric/Behavioral:  Positive for decreased concentration.    There were no vitals taken for this visit.There is no height or weight on file to calculate BMI.  General Appearance: Casual and Fairly Groomed  Eye Contact:  Good  Speech:  Clear and Coherent  Volume:  Normal  Mood:  Dysphoric  Affect:  Appropriate and Congruent  Thought Process:  Goal Directed  Orientation:  Full (Time, Place, and Person)  Thought Content: Rumination   Suicidal Thoughts:  No  Homicidal Thoughts:  No  Memory:  Immediate;   Good Recent;   Good Remote;   Fair  Judgement:  Good  Insight:  Good  Psychomotor Activity:  Decreased  Concentration:  Concentration: Fair and Attention Span: Fair  Recall:  Good  Fund of Knowledge: Good  Language: Good  Akathisia:  No  Handed:  Right  AIMS (if indicated): not done  Assets:  Communication Skills Desire for Improvement Physical Health Resilience Social Support Talents/Skills  ADL's:  Intact  Cognition: WNL  Sleep:  Fair   Screenings: GAD-7    Health and safety inspector from 05/31/2021 in [place] Office Visit from 01/07/2021 in [facility]  Total GAD-7 Score 20 16      Mini-Mental    [place] Office Visit from 10/25/2016 in [facility] [place]  Total Score (max 30 points ) 29      PHQ2-9    Flowsheet Row Video Visit from 10/07/2021 in [place]  Video Visit from 09/06/2021 in [place] Video Visit from 08/10/2021 in [place] Video Visit from 07/22/2021 in [facility] Video Visit from 06/17/2021 in [facility]  PHQ-2 Total Score 4 4 4 2 2   PHQ-9 Total Score 8 11 4 9 4       Flowsheet Row Video Visit from 10/07/2021 in [facility] [facility] Video Visit from 09/06/2021 in [facility] Video Visit from 08/10/2021 in [place] Error: Q3, 4, or 5 should not be populated when Q2 is No Error: Q3, 4, or 5 should not be populated when Q2 is No Error: Q3, 4, or 5 should not be populated when Q2 is No        Assessment and Plan: This patient is a 41 year old female with a history of depression dysthymia chronic fatigue social anxiety and ADD.  She is functioning better with the Adderall but thinks she might need a higher dose to keep going through the day so we will increase to 20 mg 3 times daily.  She will continue Xanax 1 mg 4 times daily for anxiety and Celexa 20 mg twice daily for depression.  She will return to see me in 4 weeks   [name], MD 10/07/2021, 11:55 AM

## 2021-11-04 ENCOUNTER — Encounter (HOSPITAL_COMMUNITY): Payer: Self-pay | Admitting: Psychiatry

## 2021-11-04 ENCOUNTER — Telehealth (INDEPENDENT_AMBULATORY_CARE_PROVIDER_SITE_OTHER): Payer: Medicare Other | Admitting: Psychiatry

## 2021-11-04 ENCOUNTER — Other Ambulatory Visit: Payer: Self-pay

## 2021-11-04 DIAGNOSIS — F901 Attention-deficit hyperactivity disorder, predominantly hyperactive type: Secondary | ICD-10-CM

## 2021-11-04 DIAGNOSIS — F4310 Post-traumatic stress disorder, unspecified: Secondary | ICD-10-CM | POA: Diagnosis not present

## 2021-11-04 DIAGNOSIS — F332 Major depressive disorder, recurrent severe without psychotic features: Secondary | ICD-10-CM

## 2021-11-04 MED ORDER — AMPHETAMINE-DEXTROAMPHETAMINE 20 MG PO TABS: 20.0000 mg | ORAL_TABLET | Freq: Three times a day (TID) | ORAL | 0 refills | Status: DC

## 2021-11-04 MED ORDER — CITALOPRAM HYDROBROMIDE 20 MG PO TABS: 20.0000 mg | ORAL_TABLET | Freq: Two times a day (BID) | ORAL | 2 refills | Status: DC

## 2021-11-04 MED ORDER — ALPRAZOLAM 1 MG PO TABS: 1.0000 mg | ORAL_TABLET | Freq: Four times a day (QID) | ORAL | 2 refills | Status: DC

## 2021-11-04 NOTE — Progress Notes (Signed)
Virtual Visit via Telephone Note  I connected with Destiny Orr on 11/04/21 at 10:00 AM EST by telephone and verified that I am speaking with the correct person using two identifiers.  Location: Patient: home Provider: home office   I discussed the limitations, risks, security and privacy concerns of performing an evaluation and management service by telephone and the availability of in person appointments. I also discussed with the patient that there may be a patient responsible charge related to this service. The patient expressed understanding and agreed to proceed.      I discussed the assessment and treatment plan with the patient. The patient was provided an opportunity to ask questions and all were answered. The patient agreed with the plan and demonstrated an understanding of the instructions.   The patient was advised to call back or seek an in-person evaluation if the symptoms worsen or if the condition fails to improve as anticipated.  I provided 25 minutes of non-face-to-face time during this encounter.   [name], MD  [facility] MD/PA/NP OP Progress Note  11/04/2021 10:31 AM Destiny Orr  MRN:  [id number]  Chief Complaint: depression, anxiety, ADD HPI:  This patient is a 41 year old separated white female who lives with her parents 1 son and 1 daughter in [place].  She was on disability from [employer] but now get Social Security disability  The patient returns for follow-up after 4 weeks.  She is still staying with her ex-husband and her son and daughter.  Her older daughter's boyfriend and dog are still there intermittently.  She states her older dog and boyfriend are not obeying house rules or making rest as it is causing a lot of stress.  Last time she was having trouble staying focused in the afternoon.  We did add another dosage of Adderall and it does seem to be helping.  She is somewhat depressed but does seem better than she has in the past.  She denies suicidal  ideation and her anxiety is under fairly good control Visit Diagnosis:    ICD-10-CM   1. Major depressive disorder, recurrent, severe without psychotic features ([place])  F33.2     2. PTSD (post-traumatic stress disorder)  F43.10     3. Attention deficit hyperactivity disorder (ADHD), predominantly hyperactive type  F90.1       Past Psychiatric History: Long-term outpatient treatment for depression and anxiety  Past Medical History:  Past Medical History:  Diagnosis Date   Abnormal Papanicolaou smear of cervix with positive human papilloma virus (HPV) test 10/11/2018   LSIL +HPV, get colpo___   Abnormal uterine bleeding (AUB) 12/09/2014   Anxiety    Arthritis    Depression    DJD (degenerative joint disease)    Dumping syndrome    Dysmenorrhea 07/15/2014   Fatigue 12/24/2012   Fibromyalgia diagnosed April 2016   Headache    Hx of migraine headaches 12/24/2012   Inappropriate sinus tachycardia    Irregular menstrual bleeding 07/15/2014   Pelvic pain in female 03/03/2016   Pneumothorax, spontaneous, tension    Post concussion syndrome    POTS (postural orthostatic tachycardia syndrome)    PTSD (post-traumatic stress disorder)    Scoliosis    Thyroid disease    Tick bite 03/03/2016   Vitamin B 12 deficiency     Past Surgical History:  Procedure Laterality Date   bone spurs toes Right    CESAREAN SECTION     COLONOSCOPY     endooscopy  KNEE SURGERY     rotate cuff lt arm      Family Psychiatric History: see below  Family History:  Family History  Problem Relation Age of Onset   Hypertension Father    Atrial fibrillation Father    Alcohol abuse Father    Cancer Maternal Grandmother        skin    Heart disease Maternal Grandmother    Other Maternal Grandmother        had thyroid removed   Breast cancer Maternal Grandmother    Heart disease Paternal Grandfather    COPD Paternal Grandfather    Hypertension Paternal Grandfather    Diabetes Paternal Grandfather     Stroke Paternal Grandfather    Cancer Maternal Grandfather        bladder,lung   Anxiety disorder Maternal Aunt    Anxiety disorder Maternal Uncle    Bipolar disorder Maternal Uncle    Breast cancer Maternal Uncle        CML   Leukemia Other    Colon cancer Other        lung-2 maternal great uncles    Social History:  Social History   Socioeconomic History   Marital status: Legally Separated    Spouse name: Not on file   Number of children: 3   Years of education: Not on file   Highest education level: Not on file  Occupational History   Not on file  Tobacco Use   Smoking status: Every Day    Packs/day: 1.00    Years: 15.00    Pack years: 15.00    Types: Cigarettes    Start date: 03/13/1997   Smokeless tobacco: Never   Tobacco comments:    "working on it" Was smoking 2.5 packs a day  Vaping Use   Vaping Use: Never used  Substance and Sexual Activity   Alcohol use: No   Drug use: No   Sexual activity: Yes    Partners: Male    Birth control/protection: None, Pill  Other Topics Concern   Not on file  Social History Narrative   Not on file   Social Determinants of Health   Financial Resource Strain: Medium Risk   Difficulty of Paying Living Expenses: Somewhat hard  Food Insecurity: Landscape architect Present   Worried About Charity fundraiser in the Last Year: Sometimes true   Arboriculturist in the Last Year: Never true  Transportation Needs: Unmet Transportation Needs   Lack of Transportation (Medical): Yes   Lack of Transportation (Non-Medical): Yes  Physical Activity: Inactive   Days of Exercise per Week: 0 days   Minutes of Exercise per Session: 0 min  Stress: Stress Concern Present   Feeling of Stress : Very much  Social Connections: Unknown   Frequency of Communication with Friends and Family: Three times a week   Frequency of Social Gatherings with Friends and Family: Never   Attends Religious Services: Never   Marine scientist or  Organizations: No   Attends Archivist Meetings: Never   Marital Status: Not on file    Allergies:  Allergies  Allergen Reactions   Prozac [Fluoxetine]     Suicidal thoughts   Topiramate Other (See Comments)    Topamax-dizziness   Lexapro [Escitalopram Oxalate] Other (See Comments)    Flat affect, No emotions    Metabolic Disorder Labs: Lab Results  Component Value Date   HGBA1C 5.4 01/07/2013   MPG 108 01/07/2013  Lab Results  Component Value Date   PROLACTIN 13.2 06/12/2016   Lab Results  Component Value Date   CHOL 204 (H) 01/07/2021   TRIG 132 01/07/2021   HDL 72 01/07/2021   CHOLHDL 2.8 01/07/2021   VLDL 31 01/07/2013   LDLCALC 109 (H) 01/07/2021   LDLCALC 80 10/08/2018   Lab Results  Component Value Date   TSH 4.080 01/07/2021   TSH 1.971 10/24/2019    Therapeutic Level Labs: Lab Results  Component Value Date   LITHIUM 0.45 (L) 01/27/2020   LITHIUM 0.24 (L) 09/03/2019   No results found for: VALPROATE No components found for:  CBMZ  Current Medications: Current Outpatient Medications  Medication Sig Dispense Refill   ALPRAZolam (XANAX) 1 MG tablet Take 1 tablet (1 mg total) by mouth 4 (four) times daily. 120 tablet 2   amphetamine-dextroamphetamine (ADDERALL) 20 MG tablet Take 1 tablet (20 mg total) by mouth 3 (three) times daily. 90 tablet 0   Cholecalciferol (VITAMIN D3) 250 MCG (10000 UT) capsule Take 10,000 Units by mouth daily. (Patient not taking: Reported on 01/07/2021)     citalopram (CELEXA) 20 MG tablet Take 1 tablet (20 mg total) by mouth 2 (two) times daily. 60 tablet 2   Cyanocobalamin (B-12 PO) Take 1 tablet by mouth daily. (Patient not taking: Reported on 01/07/2021)     cyclobenzaprine (FLEXERIL) 5 MG tablet Take 5 mg by mouth 3 (three) times daily as needed.     diltiazem (CARDIZEM) 60 MG tablet Take 60 mg as needed daily for palpitations 90 tablet 1   ibuprofen (ADVIL,MOTRIN) 200 MG tablet Take 400-600 mg by mouth every 6  (six) hours as needed for mild pain or moderate pain.     midodrine (PROAMATINE) 5 MG tablet Take 1 tablet (5 mg total) by mouth in the morning and at bedtime. 180 tablet 3   norethindrone (MICRONOR) 0.35 MG tablet Take 1 tablet (0.35 mg total) by mouth daily. 28 tablet 11   No current facility-administered medications for this visit.     Musculoskeletal: Strength & Muscle Tone: within normal limits Gait & Station: normal Patient leans: N/A  Psychiatric Specialty Exam: Review of Systems  Psychiatric/Behavioral:  Positive for dysphoric mood.   All other systems reviewed and are negative.  There were no vitals taken for this visit.There is no height or weight on file to calculate BMI.  General Appearance: Casual and Fairly Groomed  Eye Contact:  Good  Speech:  Clear and Coherent  Volume:  Normal  Mood:  Anxious  Affect:  Flat  Thought Process:  Goal Directed  Orientation:  Full (Time, Place, and Person)  Thought Content: Rumination   Suicidal Thoughts:  No  Homicidal Thoughts:  No  Memory:  Immediate;   Good Recent;   Good Remote;   Good  Judgement:  Good  Insight:  Fair  Psychomotor Activity:  Normal  Concentration:  Concentration: Good and Attention Span: Good  Recall:  Good  Fund of Knowledge: Good  Language: Good  Akathisia:  No  Handed:  Right  AIMS (if indicated): not done  Assets:  Communication Skills Desire for Improvement Physical Health Resilience Social Support  ADL's:  Intact  Cognition: WNL  Sleep:  Good   Screenings: GAD-7    Health and safety inspector from 05/31/2021 in [place] Office Visit from 01/07/2021 in [place]  Total GAD-7 Score 20 [place]  Visit from 10/[facility]/2016 in [facility]  Total Score (max 30 points ) 29      PHQ2-9    Flowsheet Row Video Visit from 11/04/2021 in [place]  Video Visit from 10/07/2021 in [place] Video Visit from 09/06/2021 in [place] Video Visit from 08/10/2021 in [facility] Video Visit from 07/22/2021 in [facility]  PHQ-2 Total Score 2 4 4 4 2   PHQ-9 Total Score 4 8 11 4 9       Flowsheet Row Video Visit from 11/04/2021 in [place] [name] Video Visit from 10/07/2021 in [facility] [facility] Video Visit from 09/06/2021 in [place] Error: Q3, 4, or 5 should not be populated when Q2 is No Error: Q3, 4, or 5 should not be populated when Q2 is No Error: Q3, 4, or 5 should not be populated when Q2 is No        Assessment and Plan: This patient is a 41 year old female with a history of depression dysthymia chronic fatigue social anxiety and ADD.  She seems stressed with the current living situation but for the most part she is functioning better than she did several months ago in terms of her depression and anxiety and focus.  She will continue Adderall 20 mg 3 times a day for ADD, Xanax 1 mg 4 times daily for anxiety and Celexa 20 mg twice daily for depression.  She will return to see me in 4 weeks   [name], MD 11/04/2021, 10:31 AM

## 2021-11-29 ENCOUNTER — Other Ambulatory Visit: Payer: Self-pay

## 2021-11-29 ENCOUNTER — Ambulatory Visit (INDEPENDENT_AMBULATORY_CARE_PROVIDER_SITE_OTHER): Payer: Medicare Other | Admitting: Clinical

## 2021-11-29 DIAGNOSIS — F4310 Post-traumatic stress disorder, unspecified: Secondary | ICD-10-CM | POA: Diagnosis not present

## 2021-11-29 DIAGNOSIS — F332 Major depressive disorder, recurrent severe without psychotic features: Secondary | ICD-10-CM

## 2021-11-29 NOTE — Progress Notes (Signed)
Virtual Visit via Telephone Note   I connected with Destiny Orr on 11/29/21 at 9:00 AM EST by telephone and verified that I am speaking with the correct person using two identifiers.   Location: Patient: Home Provider: Office   I discussed the limitations, risks, security and privacy concerns of performing an evaluation and management service by telephone and the availability of in person appointments. I also discussed with the patient that there may be a patient responsible charge related to this service. The patient expressed understanding and agreed to proceed.     THERAPIST PROGRESS NOTE   Session Time: 9:00AM-9:45AM   Participation Level: Active   Behavioral Response: CasualAlertDepressed   Type of Therapy: Individual Therapy   Treatment Goals addressed: Coping   Interventions: CBT, Motivational Interviewing, Strength-based and Supportive   Summary: Destiny Orr is a 41 y.o. female who presents with Depression and PTSD.The OPT therapist worked with the patient for her first session re-integrating into outpatient. OPT treatment. The OPT therapist utilized Motivational Interviewing to assist in creating therapeutic repore. The patient in the session was engaged and work in collaboration giving feedback about her triggers and symptoms over the past few weeks. The patient spoke about ongoing difficulty with changes in the home with the patients oldest daughter and her boyfriend currently living in the home and this creating more stress and financial strain on the patient.The OPT therapist utilized Cognitive Behavioral Therapy through cognitive restructuring as well as worked with the patient on self awareness and implementing coping strategies to assist in management additional current stress of the patients oldest daughter and boyfriend living in the home.The OPT therapist provided ongoing psychoeducation  and support throughout the session with the patient.     Suicidal/Homicidal: Nowithout  intent/plan   Therapist Response: The OPT therapist worked with the patient for the patients scheduled session. The patient was engaged in her session and gave feedback in relation to triggers, symptoms, and behavior responses over the past few weeks including the impact of the patients oldest daughter and boyfriend living in the home and this creating extra financial strain on the patient and creating frustration.The OPT therapist worked with the patient utilizing an in session Cognitive Behavioral Therapy exercise. The patient was responsive in the session and verbalized, " My daughter and her boyfriend are still living here and they were suppose to be moving back out and this was suppose to be short term but they are here and not helping and its creating more stress at home having them here and how she is interacting with everyone and not picking up after herself, they were suppose to move back out at the end of January and they have not been saving money so I do not see them moving out anytime soon".  The OPT therapist worked with the patient on communicating with her oldest daughter and setting in home expectations and communicating about progression of the patients daughter and boyfriend moving back out of the home as well as a plan for them to move out. The OPT therapist will continue treatment work with the patient in her next scheduled session   Plan: Return again in 2/3 weeks.   Diagnosis:      Axis I: Major depressive disorder, recurrent, severe without psychotic features and PTSD (post-traumatic stress disorder)                           Axis II: No diagnosis  Collaboration of Care: No additional collaboration of care for this session.  Patient/Guardian was advised Release of Information must be obtained prior to any record release in order to collaborate their care with an outside provider. Patient/Guardian was advised if they have not already done so to contact the registration  department to sign all necessary forms in order for [place] to release information regarding their care.   Consent: Patient/Guardian gives verbal consent for treatment and assignment of benefits for services provided during this visit. Patient/Guardian expressed understanding and agreed to proceed.   I discussed the assessment and treatment plan with the patient. The patient was provided an opportunity to ask questions and all were answered. The patient agreed with the plan and demonstrated an understanding of the instructions.   The patient was advised to call back or seek an in-person evaluation if the symptoms worsen or if the condition fails to improve as anticipated.   I provided 45 minutes of non-face-to-face time during this encounter.     Lennox Grumbles, LCSW   11/29/2021

## 2021-12-02 ENCOUNTER — Telehealth (INDEPENDENT_AMBULATORY_CARE_PROVIDER_SITE_OTHER): Payer: Medicare Other | Admitting: Psychiatry

## 2021-12-02 ENCOUNTER — Other Ambulatory Visit: Payer: Self-pay

## 2021-12-02 ENCOUNTER — Encounter (HOSPITAL_COMMUNITY): Payer: Self-pay | Admitting: Psychiatry

## 2021-12-02 DIAGNOSIS — F332 Major depressive disorder, recurrent severe without psychotic features: Secondary | ICD-10-CM | POA: Diagnosis not present

## 2021-12-02 DIAGNOSIS — F4310 Post-traumatic stress disorder, unspecified: Secondary | ICD-10-CM | POA: Diagnosis not present

## 2021-12-02 DIAGNOSIS — F901 Attention-deficit hyperactivity disorder, predominantly hyperactive type: Secondary | ICD-10-CM | POA: Diagnosis not present

## 2021-12-02 MED ORDER — AMPHETAMINE-DEXTROAMPHETAMINE 20 MG PO TABS: 20.0000 mg | ORAL_TABLET | Freq: Three times a day (TID) | ORAL | 0 refills | Status: DC

## 2021-12-02 MED ORDER — CITALOPRAM HYDROBROMIDE 20 MG PO TABS: 20.0000 mg | ORAL_TABLET | Freq: Two times a day (BID) | ORAL | 2 refills | Status: DC

## 2021-12-02 MED ORDER — ALPRAZOLAM 1 MG PO TABS: 1.0000 mg | ORAL_TABLET | Freq: Four times a day (QID) | ORAL | 2 refills | Status: DC

## 2021-12-02 NOTE — Progress Notes (Signed)
Virtual Visit via Video Note  I connected with Destiny Orr on 12/02/21 at 11:20 AM EST by a video enabled telemedicine application and verified that I am speaking with the correct person using two identifiers.  Location: Patient: home Provider: office   I discussed the limitations of evaluation and management by telemedicine and the availability of in person appointments. The patient expressed understanding and agreed to proceed.    I discussed the assessment and treatment plan with the patient. The patient was provided an opportunity to ask questions and all were answered. The patient agreed with the plan and demonstrated an understanding of the instructions.   The patient was advised to call back or seek an in-person evaluation if the symptoms worsen or if the condition fails to improve as anticipated.  I provided 16 minutes of non-face-to-face time during this encounter.   [name], MD  [name] MD/PA/NP OP Progress Note  12/02/2021 11:40 AM Destiny Orr  MRN:  [id number]  Chief Complaint:  Chief Complaint  Patient presents with   Anxiety   Depression   Follow-up   HPI: This patient is a 41 year old separated white female who lives with her parents 1 son and 1 daughter in [place].  She was on disability from [employer] but now get Social Security disability  She returns for follow-up after 4 weeks.  She states that she is not feeling well and is that either the cold or flu.  She is having congestion sore throat and maybe low-grade fever.  I urged her to call one of our urgent care telemedicine group since she does not have a primary care physician.  In terms of mood she is doing okay.  She still having conflicts with her oldest daughter and the daughter's boyfriend who have moved down.  She does not think they doing anything to help around the house and are using the situation to their advantage.  Both the therapist and I have told her that she and her ex-husband need to sit  down with them and set limits on these behaviors and that date for when they need to move out.  She is in agreement.  The patient states that her mood has been stable.  She is not significantly depressed or anxious.  She is focusing well.  She is sleeping fairly well. Visit Diagnosis:    ICD-10-CM   1. Major depressive disorder, recurrent, severe without psychotic features ([name])  F33.2     2. PTSD (post-traumatic stress disorder)  F43.10     3. Attention deficit hyperactivity disorder (ADHD), predominantly hyperactive type  F90.1       Past Psychiatric History: Long-term outpatient treatment for depression and anxiety  Past Medical History:  Past Medical History:  Diagnosis Date   Abnormal Papanicolaou smear of cervix with positive human papilloma virus (HPV) test 10/11/2018   LSIL +HPV, get colpo___   Abnormal uterine bleeding (AUB) 12/09/2014   Anxiety    Arthritis    Depression    DJD (degenerative joint disease)    Dumping syndrome    Dysmenorrhea 07/15/2014   Fatigue 12/24/2012   Fibromyalgia diagnosed April 2016   Headache    Hx of migraine headaches 12/24/2012   Inappropriate sinus tachycardia    Irregular menstrual bleeding 07/15/2014   Pelvic pain in female 03/03/2016   Pneumothorax, spontaneous, tension    Post concussion syndrome    POTS (postural orthostatic tachycardia syndrome)    PTSD (post-traumatic stress disorder)    Scoliosis  Thyroid disease    Tick bite 03/03/2016   Vitamin B 12 deficiency     Past Surgical History:  Procedure Laterality Date   bone spurs toes Right    CESAREAN SECTION     COLONOSCOPY     endooscopy     KNEE SURGERY     rotate cuff lt arm      Family Psychiatric History: see below  Family History:  Family History  Problem Relation Age of Onset   Hypertension Father    Atrial fibrillation Father    Alcohol abuse Father    Cancer Maternal Grandmother        skin    Heart disease Maternal Grandmother    Other Maternal  Grandmother        had thyroid removed   Breast cancer Maternal Grandmother    Heart disease Paternal Grandfather    COPD Paternal Grandfather    Hypertension Paternal Grandfather    Diabetes Paternal Grandfather    Stroke Paternal Grandfather    Cancer Maternal Grandfather        bladder,lung   Anxiety disorder Maternal Aunt    Anxiety disorder Maternal Uncle    Bipolar disorder Maternal Uncle    Breast cancer Maternal Uncle        CML   Leukemia Other    Colon cancer Other        lung-2 maternal great uncles    Social History:  Social History   Socioeconomic History   Marital status: Legally Separated    Spouse name: Not on file   Number of children: 3   Years of education: Not on file   Highest education level: Not on file  Occupational History   Not on file  Tobacco Use   Smoking status: Every Day    Packs/day: 1.00    Years: 15.00    Pack years: 15.00    Types: Cigarettes    Start date: 03/13/1997   Smokeless tobacco: Never   Tobacco comments:    "working on it" Was smoking 2.5 packs a day  Vaping Use   Vaping Use: Never used  Substance and Sexual Activity   Alcohol use: No   Drug use: No   Sexual activity: Yes    Partners: Male    Birth control/protection: None, Pill  Other Topics Concern   Not on file  Social History Narrative   Not on file   Social Determinants of Health   Financial Resource Strain: Medium Risk   Difficulty of Paying Living Expenses: Somewhat hard  Food Insecurity: Landscape architect Present   Worried About Charity fundraiser in the Last Year: Sometimes true   Arboriculturist in the Last Year: Never true  Transportation Needs: Unmet Transportation Needs   Lack of Transportation (Medical): Yes   Lack of Transportation (Non-Medical): Yes  Physical Activity: Inactive   Days of Exercise per Week: 0 days   Minutes of Exercise per Session: 0 min  Stress: Stress Concern Present   Feeling of Stress : Very much  Social Connections:  Unknown   Frequency of Communication with Friends and Family: Three times a week   Frequency of Social Gatherings with Friends and Family: Never   Attends Religious Services: Never   Marine scientist or Organizations: No   Attends Archivist Meetings: Never   Marital Status: Not on file    Allergies:  Allergies  Allergen Reactions   Prozac [Fluoxetine]  Suicidal thoughts   Topiramate Other (See Comments)    Topamax-dizziness   Lexapro [Escitalopram Oxalate] Other (See Comments)    Flat affect, No emotions    Metabolic Disorder Labs: Lab Results  Component Value Date   HGBA1C 5.4 01/07/2013   MPG 108 01/07/2013   Lab Results  Component Value Date   PROLACTIN 13.2 06/12/2016   Lab Results  Component Value Date   CHOL 204 (H) 01/07/2021   TRIG 132 01/07/2021   HDL 72 01/07/2021   CHOLHDL 2.8 01/07/2021   VLDL 31 01/07/2013   LDLCALC 109 (H) 01/07/2021   LDLCALC 80 10/08/2018   Lab Results  Component Value Date   TSH 4.080 01/07/2021   TSH 1.971 10/24/2019    Therapeutic Level Labs: Lab Results  Component Value Date   LITHIUM 0.45 (L) 01/27/2020   LITHIUM 0.24 (L) 09/03/2019   No results found for: VALPROATE No components found for:  CBMZ  Current Medications: Current Outpatient Medications  Medication Sig Dispense Refill   amphetamine-dextroamphetamine (ADDERALL) 20 MG tablet Take 1 tablet (20 mg total) by mouth 3 (three) times daily. 90 tablet 0   ALPRAZolam (XANAX) 1 MG tablet Take 1 tablet (1 mg total) by mouth 4 (four) times daily. 120 tablet 2   amphetamine-dextroamphetamine (ADDERALL) 20 MG tablet Take 1 tablet (20 mg total) by mouth 3 (three) times daily. 90 tablet 0   Cholecalciferol (VITAMIN D3) 250 MCG (10000 UT) capsule Take 10,000 Units by mouth daily. (Patient not taking: Reported on 01/07/2021)     citalopram (CELEXA) 20 MG tablet Take 1 tablet (20 mg total) by mouth 2 (two) times daily. 60 tablet 2   Cyanocobalamin (B-12 PO)  Take 1 tablet by mouth daily. (Patient not taking: Reported on 01/07/2021)     cyclobenzaprine (FLEXERIL) 5 MG tablet Take 5 mg by mouth 3 (three) times daily as needed.     diltiazem (CARDIZEM) 60 MG tablet Take 60 mg as needed daily for palpitations 90 tablet 1   ibuprofen (ADVIL,MOTRIN) 200 MG tablet Take 400-600 mg by mouth every 6 (six) hours as needed for mild pain or moderate pain.     midodrine (PROAMATINE) 5 MG tablet Take 1 tablet (5 mg total) by mouth in the morning and at bedtime. 180 tablet 3   norethindrone (MICRONOR) 0.35 MG tablet Take 1 tablet (0.35 mg total) by mouth daily. 28 tablet 11   No current facility-administered medications for this visit.     Musculoskeletal: Strength & Muscle Tone: within normal limits Gait & Station: normal Patient leans: N/A  Psychiatric Specialty Exam: Review of Systems  HENT:  Positive for congestion and sinus pressure.   Respiratory:  Positive for cough and chest tightness.   All other systems reviewed and are negative.  There were no vitals taken for this visit.There is no height or weight on file to calculate BMI.  General Appearance: Casual and Fairly Groomed  Eye Contact:  Good  Speech:  Clear and Coherent  Volume:  Normal  Mood:  Euthymic  Affect:  Appropriate and Congruent  Thought Process:  Goal Directed  Orientation:  Full (Time, Place, and Person)  Thought Content: Rumination   Suicidal Thoughts:  No  Homicidal Thoughts:  No  Memory:  Immediate;   Good Recent;   Good Remote;   Good  Judgement:  Good  Insight:  Fair  Psychomotor Activity:  Decreased  Concentration:  Concentration: Good and Attention Span: Good  Recall:  Good  Fund of Knowledge:  Good  Language: Good  Akathisia:  No  Handed:  Right  AIMS (if indicated): not done  Assets:  Communication Skills Desire for Improvement Physical Health Resilience Social Support Talents/Skills  ADL's:  Intact  Cognition: WNL  Sleep:  Good   Screenings: GAD-7     Flowsheet Row Counselor from 05/31/2021 in [place] [facility] Visit from 01/07/2021 in [facility]  Total GAD-7 Score 20 16      Mini-Mental    [place] Office Visit from 10/25/2016 in [facility]  Total Score (max 30 points ) 29      PHQ2-9    Flowsheet Row Video Visit from 12/02/2021 in [place] Video Visit from 11/04/2021 in [place] Video Visit from 10/07/2021 in [place] Video Visit from 09/06/2021 in [facility] Video Visit from 08/10/2021 in [facility]  PHQ-2 Total Score 1 2 4 4 4   PHQ-9 Total Score -- 4 8 11 4       Flowsheet Row Video Visit from 12/02/2021 in [facility] Video Visit from 11/04/2021 in [facility] Video Visit from 10/07/2021 in [facility] Error: Q3, 4, or 5 should not be populated when Q2 is No Error: Q3, 4, or 5 should not be populated when Q2 is No Error: Q3, 4, or 5 should not be populated when Q2 is No        Assessment and Plan: Patient is a 41 year old female with a history of depression dysthymia chronic fatigue social anxiety and ADD.  She seems to be functioning better than she has in the past.  She will continue Adderall 20 mg 3 times daily for ADD, Xanax 1 mg 4 times daily for anxiety and Celexa 20 mg twice daily for depression.  She will return to see me in 2 months  Collaboration of Care: Collaboration of Care: Referral or follow-up with counselor/therapist AEB follow-up has been arranged with a counselor in our office  Patient/Guardian was advised Release of Information must be obtained prior to any record release in  order to collaborate their care with an outside provider. Patient/Guardian was advised if they have not already done so to contact the registration department to sign all necessary forms in order for [place] to release information regarding their care.   Consent: Patient/Guardian gives verbal consent for treatment and assignment of benefits for services provided during this visit. Patient/Guardian expressed understanding and agreed to proceed.    [name], MD 12/02/2021, 11:40 AM

## 2021-12-05 ENCOUNTER — Ambulatory Visit: Payer: Medicare Other | Admitting: Obstetrics & Gynecology

## 2021-12-05 ENCOUNTER — Telehealth: Payer: Medicare Other | Admitting: Nurse Practitioner

## 2021-12-05 DIAGNOSIS — J0110 Acute frontal sinusitis, unspecified: Secondary | ICD-10-CM

## 2021-12-05 MED ORDER — AMOXICILLIN-POT CLAVULANATE 875-125 MG PO TABS: 1.0000 | ORAL_TABLET | Freq: Two times a day (BID) | ORAL | 0 refills | Status: AC

## 2021-12-05 NOTE — Progress Notes (Signed)
Virtual Visit Consent   Destiny Orr, you are scheduled for a virtual visit with a [facility] provider today.     Just as with appointments in the office, your consent must be obtained to participate.  Your consent will be active for this visit and any virtual visit you may have with one of our providers in the next 365 days.     If you have a MyChart account, a copy of this consent can be sent to you electronically.  All virtual visits are billed to your insurance company just like a traditional visit in the office.    As this is a virtual visit, video technology does not allow for your provider to perform a traditional examination.  This may limit your provider's ability to fully assess your condition.  If your provider identifies any concerns that need to be evaluated in person or the need to arrange testing (such as labs, EKG, etc.), we will make arrangements to do so.     Although advances in technology are sophisticated, we cannot ensure that it will always work on either your end or our end.  If the connection with a video visit is poor, the visit may have to be switched to a telephone visit.  With either a video or telephone visit, we are not always able to ensure that we have a secure connection.     I need to obtain your verbal consent now.   Are you willing to proceed with your visit today?    Destiny Orr has provided verbal consent on 12/05/2021 for a virtual visit (video or telephone).   Apolonio Schneiders, FNP   Date: 12/05/2021 2:08 PM   Virtual Visit via Video Note   I, Apolonio Schneiders, connected with  Destiny Orr  ([id number], [birth year]) on 12/05/21 at  2:15 PM EST by a video-enabled telemedicine application and verified that I am speaking with the correct person using two identifiers.  Location: Patient: Virtual Visit Location Patient: Home Provider: Virtual Visit Location Provider: Home Office   I discussed the limitations of evaluation and management by telemedicine and the  availability of in person appointments. The patient expressed understanding and agreed to proceed.    History of Present Illness: Destiny Orr is a 41 y.o. who identifies as a female who was assigned female at birth, and is being seen today with complaints of sinus congestion and a sore throat for the past 7 days that onset abruptly, improved over the weekend and then returned with worsening thicker drainage today. She has a scratchy throat and PND without a cough.   She did use Mucinex cold and flu yesterday   Last night she had acute onset of central chest pain that radiated to her right face and lasted for a few minutes. She felt tightness in her chest for about 2 hours. She took some OTC aspirin for relief. Denies SOB, denies changes in LOC. She suffers from Viola and watches her heart rate on her watch. She did not see any change in her heart rate during this incident.   She has seen a cardiologist in the past for her POTs Review of labs total cholesterol 204 LDL 109   Denies a current history of HTN  She has been taking a daily aspirin for a daily headache she has had for the past 2 weeks.   She does also suffer from anxiety.   She notes she has been under increased stress lately  She does have a primary  care doctor and has an appointment next week. She has not been to the PCP in awhile since she has been waiting for her Medicare to kick in.   Problems:  Patient Active Problem List   Diagnosis Date Noted   Encounter for surveillance of contraceptive pills 01/07/2021   History of abnormal cervical Pap smear 01/07/2021   Screening cholesterol level 01/07/2021   Vitamin D deficiency 01/20/2020   Hypothyroidism 11/15/2018   Abnormal Papanicolaou smear of cervix with positive human papilloma virus (HPV) test 10/11/2018   Goiter 10/08/2018   Breast tenderness 10/08/2018   Mass of upper outer quadrant of right breast 10/08/2018   Encounter for gynecological examination with Papanicolaou  smear of cervix 10/08/2018   Current smoker 07/03/2017   Leukocytosis 06/18/2017   Thyroid nodule 12/26/2016   Neck injury, initial encounter 10/20/2016   Concussion with loss of consciousness 10/20/2016   Injury of left shoulder 10/20/2016   Sinus tachycardia 10/16/2016   Post concussion syndrome 10/03/2016   Intractable headache 10/03/2016   Dizziness 10/03/2016   Blurry vision, right eye 10/03/2016   Nodular goiter 06/18/2016   Fibromyalgia syndrome 06/16/2016   Raynaud's syndrome without gangrene 06/16/2016   Numbness and tingling sensation of skin 06/16/2016   B12 deficiency 06/16/2016   Dizziness and giddiness 06/16/2016   Pelvic pain in female 03/03/2016   Tick bite 03/03/2016   POTS (postural orthostatic tachycardia syndrome) 02/05/2016   Abnormal uterine bleeding (AUB) 12/09/2014   Depression 11/26/2014   Dysmenorrhea 07/15/2014   Irregular menstrual bleeding 07/15/2014   Back pain, chronic 04/02/2013   Chronic fatigue and malaise 12/24/2012   Anxiety 12/24/2012   PTSD (post-traumatic stress disorder) 12/24/2012   ADD (attention deficit disorder) 12/24/2012   Contraception 12/24/2012   Hx of migraine headaches 12/24/2012    Allergies:  Allergies  Allergen Reactions   Prozac [Fluoxetine]     Suicidal thoughts   Topiramate Other (See Comments)    Topamax-dizziness   Lexapro [Escitalopram Oxalate] Other (See Comments)    Flat affect, No emotions   Medications:  Current Outpatient Medications:    ALPRAZolam (XANAX) 1 MG tablet, Take 1 tablet (1 mg total) by mouth 4 (four) times daily., Disp: 120 tablet, Rfl: 2   amphetamine-dextroamphetamine (ADDERALL) 20 MG tablet, Take 1 tablet (20 mg total) by mouth 3 (three) times daily., Disp: 90 tablet, Rfl: 0   amphetamine-dextroamphetamine (ADDERALL) 20 MG tablet, Take 1 tablet (20 mg total) by mouth 3 (three) times daily., Disp: 90 tablet, Rfl: 0   Cholecalciferol (VITAMIN D3) 250 MCG (10000 UT) capsule, Take 10,000 Units  by mouth daily. (Patient not taking: Reported on 01/07/2021), Disp: , Rfl:    citalopram (CELEXA) 20 MG tablet, Take 1 tablet (20 mg total) by mouth 2 (two) times daily., Disp: 60 tablet, Rfl: 2   Cyanocobalamin (B-12 PO), Take 1 tablet by mouth daily. (Patient not taking: Reported on 01/07/2021), Disp: , Rfl:    cyclobenzaprine (FLEXERIL) 5 MG tablet, Take 5 mg by mouth 3 (three) times daily as needed., Disp: , Rfl:    diltiazem (CARDIZEM) 60 MG tablet, Take 60 mg as needed daily for palpitations, Disp: 90 tablet, Rfl: 1   ibuprofen (ADVIL,MOTRIN) 200 MG tablet, Take 400-600 mg by mouth every 6 (six) hours as needed for mild pain or moderate pain., Disp: , Rfl:    midodrine (PROAMATINE) 5 MG tablet, Take 1 tablet (5 mg total) by mouth in the morning and at bedtime., Disp: 180 tablet, Rfl: 3  norethindrone (MICRONOR) 0.35 MG tablet, Take 1 tablet (0.35 mg total) by mouth daily., Disp: 28 tablet, Rfl: 11  Observations/Objective: Patient is well-developed, well-nourished in no acute distress.  Resting comfortably at home.  Head is normocephalic, atraumatic.  No labored breathing.  Speech is clear and coherent with logical content.  Patient is alert and oriented at baseline.    Assessment and Plan: 1. Acute non-recurrent frontal sinusitis Meds ordered this encounter  Medications   amoxicillin-clavulanate (AUGMENTIN) 875-125 MG tablet    Sig: Take 1 tablet by mouth 2 (two) times daily for 7 days. Take with food    Dispense:  14 tablet    Refill:  0     Discussed chest pain and future plans to go to ED or call 911 if that occurs again.   She is aware, believes current episode was from her anxiety and POTs If symptoms vary from her norm she will seek immediate medical care as discussed Will otherwise follow up with PCP next week as planned.   Stop OTC decongestant as this can increase blood pressure     Follow Up Instructions: I discussed the assessment and treatment plan with the  patient. The patient was provided an opportunity to ask questions and all were answered. The patient agreed with the plan and demonstrated an understanding of the instructions.  A copy of instructions were sent to the patient via MyChart unless otherwise noted below.    The patient was advised to call back or seek an in-person evaluation if the symptoms worsen or if the condition fails to improve as anticipated.  Time:  I spent 15 minutes with the patient via telehealth technology discussing the above problems/concerns.    Apolonio Schneiders, FNP

## 2021-12-08 ENCOUNTER — Ambulatory Visit: Payer: Medicare Other | Admitting: Adult Health

## 2021-12-08 ENCOUNTER — Encounter: Payer: Self-pay | Admitting: Adult Health

## 2021-12-08 ENCOUNTER — Other Ambulatory Visit: Payer: Self-pay

## 2021-12-08 VITALS — BP 122/84 | HR 118 | Ht 65.0 in | Wt 168.0 lb

## 2021-12-08 DIAGNOSIS — N6311 Unspecified lump in the right breast, upper outer quadrant: Secondary | ICD-10-CM | POA: Diagnosis not present

## 2021-12-08 DIAGNOSIS — N926 Irregular menstruation, unspecified: Secondary | ICD-10-CM

## 2021-12-08 DIAGNOSIS — R102 Pelvic and perineal pain: Secondary | ICD-10-CM

## 2021-12-08 NOTE — Progress Notes (Signed)
?  Subjective:  ?  ? Patient ID: Destiny Orr, female   DOB: [birth year], 41 y.o.   MRN: [id number] ? ?HPI ?Destiny Orr is a 41 year old white female, separated, G3P2013 in complaining of cramping daily, worse after sex and has bleeding on and off, may be light, then heavy then nothing. ?Has knot and tenderness right breast too. ? ?Lab Results  ?Component Value Date  ? DIAGPAP  01/07/2021  ?  - Negative for Intraepithelial Lesions or Malignancy (NILM)  ? DIAGPAP - Benign reactive/reparative changes 01/07/2021  ? HPV DETECTED (A) 10/08/2018  ? [place] Negative 01/07/2021  ?  ?Review of Systems ?+cramping ?+irregular bleeding ? ?  Reviewed past medical,surgical, social and family history. Reviewed medications and allergies.  ?Objective:  ? Physical Exam ?BP 122/84 (BP Location: Left Arm, Patient Position: Sitting, Cuff Size: Normal)   Pulse (!) 118   Ht '5\' 5"'$  (1.651 m)   Wt 168 lb (76.2 kg)   LMP 12/08/2021 (Exact Date)   BMI 27.96 kg/m?   ?   Skin warm and dry,  Breasts:no dominate palpable mass, retraction or nipple discharge on the left, on the right, no retraction or nipple discharge, has 2 cm irregular mass at 11 0' clock, tender. ?Pelvic: external genitalia is normal in appearance no lesions, vagina: brown discharge without odor,urethra has no lesions or masses noted, cervix:smooth and bulbous, uterus: normal size, shape and contour, non tender, no masses felt, adnexa: no masses or tenderness noted. Bladder is non tender and no masses felt. ? [facility] - 12/08/21 1208   ? ?  ? Pregnancy Intention Screening  ? Does the patient want to become pregnant in the next year? No   ? Does the patient's partner want to become pregnant in the next year? No   ? Would the patient like to discuss contraceptive options today? No   ?  ? Contraception Wrap Up  ? Current Method Oral Contraceptive   ? End Method Oral Contraceptive   ? Contraception Counseling Provided No   ? ?  ?  ? ?  ? Examination chaperoned by Marcelino Scot RN ? ?Assessment:   ?   ?1. Irregular menstrual bleeding ?Will get pelvic US to assess uterus and ovaries in 1 week  ?- US PELVIC COMPLETE WITH TRANSVAGINAL; Future ?Gave handout on endometrial ablation ? ?2. Pelvic cramping ?Will get pelvic US ?- US PELVIC COMPLETE WITH TRANSVAGINAL; Future ? ?3. Mass of upper outer quadrant of right breast ?Diagnostic mammogram and [place] scheduled for [facility]/03/22 at [facility] at 11:20 am  ?- US BREAST LTD UNI RIGHT INC AXILLA; Future ?- MM DIAG BREAST TOMO BILATERAL; Future ?- US BREAST LTD UNI LEFT INC AXILLA; Future  ?   ?Plan:  ?   ?[place] in 1 week will follow TBD ?   ?

## 2021-12-14 ENCOUNTER — Ambulatory Visit (INDEPENDENT_AMBULATORY_CARE_PROVIDER_SITE_OTHER): Payer: Medicare Other

## 2021-12-14 ENCOUNTER — Other Ambulatory Visit: Payer: Self-pay

## 2021-12-14 ENCOUNTER — Ambulatory Visit: Payer: Medicare Other | Admitting: Physician Assistant

## 2021-12-14 DIAGNOSIS — N926 Irregular menstruation, unspecified: Secondary | ICD-10-CM | POA: Diagnosis not present

## 2021-12-14 DIAGNOSIS — R102 Pelvic and perineal pain: Secondary | ICD-10-CM | POA: Diagnosis not present

## 2021-12-14 NOTE — Progress Notes (Signed)
PELVIC US TA/TV:homogeneous anteverted uterus,WNL,EEC 1.2 mm,trace of simple fluid within the endometrium,normal ovaries,left ovary best images on transabdominal images(image 14),no free fluid,no pain during ultrasound  ?

## 2021-12-16 ENCOUNTER — Telehealth: Payer: Self-pay | Admitting: Adult Health

## 2021-12-16 NOTE — Telephone Encounter (Signed)
Left message that [place] was normal ?

## 2021-12-20 ENCOUNTER — Other Ambulatory Visit: Payer: Self-pay

## 2021-12-20 ENCOUNTER — Ambulatory Visit (INDEPENDENT_AMBULATORY_CARE_PROVIDER_SITE_OTHER): Payer: Medicare Other | Admitting: Clinical

## 2021-12-20 DIAGNOSIS — F4310 Post-traumatic stress disorder, unspecified: Secondary | ICD-10-CM

## 2021-12-20 DIAGNOSIS — F332 Major depressive disorder, recurrent severe without psychotic features: Secondary | ICD-10-CM

## 2021-12-20 NOTE — Progress Notes (Signed)
Virtual Visit via Telephone Note ?  ?I connected with Destiny Orr on 12/20/21 at 11:00 AM EST by telephone and verified that I am speaking with the correct person using two identifiers. ?  ?Location: ?Patient: Home ?Provider: Office ?  ?I discussed the limitations, risks, security and privacy concerns of performing an evaluation and management service by telephone and the availability of in person appointments. I also discussed with the patient that there may be a patient responsible charge related to this service. The patient expressed understanding and agreed to proceed. ?  ?  ?THERAPIST PROGRESS NOTE ?  ?Session Time: 11:00AM-11:30AM ?  ?Participation Level: Active ?  ?Behavioral Response: CasualAlertDepressed ?  ?Type of Therapy: Individual Therapy ?  ?Treatment Goals addressed: Coping ?  ?Interventions: CBT, Motivational Interviewing, Strength-based and Supportive ?  ?Summary: Destiny Orr is a 41 y.o. female who presents with Depression and PTSD.The OPT therapist worked with the patient for her ongoing outpatient. OPT treatment. The OPT therapist utilized Motivational Interviewing to assist in creating therapeutic repore. The patient in the session was engaged and work in collaboration giving feedback about her triggers and symptoms over the past few weeks. The patient spoke about ongoing difficulty with mood management and intensity of mood swings and verbalized she does not feel her medication is working . The OPT therapist then overviewed the patients last medication management appointment in which the patient agreed how she presented may not have been reflective of how she is feeling. The OPT therapist placed emphasis on the patients work in her Med program  ?To ensure she and the provider are on the same page by being very forthcoming in how she describes how she is feeling over not just the day but a larger spectrum of time (last few weeks) while acknowledging in part this may have gotten more difficulty in  the more recent weeks after the last med appointment.The OPT therapist utilized Cognitive Behavioral Therapy through cognitive restructuring as well as worked with the patient on self awareness and implementing coping strategies to assist in management additional current stress of the patients oldest daughter and boyfriend living in the home.The OPT therapist provided ongoing psychoeducation  and support throughout the session with the patient.   ?  ?Suicidal/Homicidal: Nowithout intent/plan ?  ?Therapist Response: The OPT therapist worked with the patient for the patients scheduled session. The patient was engaged in her session and gave feedback in relation to triggers, symptoms, and behavior responses over the past few weeks including the impact of the patients oldest daughter and boyfriend living in the home and this creating extra financial strain on the patient and creating frustration.The OPT therapist worked with the patient utilizing an in session Cognitive Behavioral Therapy exercise. The patient was responsive in the session and verbalized, " The living situation has not changed and the stress is not going away".  The OPT therapist  continued to work with the patient on communicating with her oldest daughter and setting in home expectations and communicating about progression of the patients daughter and boyfriend moving back out of the home as well as a plan for them to move out. The OPT therapist will continue treatment work with the patient in her next scheduled session ?  ?Plan: Return again in 2/3 weeks. ?  ?Diagnosis:      Axis I: Major depressive disorder, recurrent, severe without psychotic features and PTSD (post-traumatic stress disorder) ?  ?  Axis II: No diagnosis ?  ?  ?  ?Collaboration of Care: Collaboration of Care (psychiatrist) Dr. Harrington Challenger.  ?  ?Patient/Guardian was advised Release of Information must be obtained prior to any record release in order to collaborate their  care with an outside provider. Patient/Guardian was advised if they have not already done so to contact the registration department to sign all necessary forms in order for [place] to release information regarding their care.  ?  ?Consent: Patient/Guardian gives verbal consent for treatment and assignment of benefits for services provided during this visit. Patient/Guardian expressed understanding and agreed to proceed.  ?  ?I discussed the assessment and treatment plan with the patient. The patient was provided an opportunity to ask questions and all were answered. The patient agreed with the plan and demonstrated an understanding of the instructions. ?  ?The patient was advised to call back or seek an in-person evaluation if the symptoms worsen or if the condition fails to improve as anticipated. ?  ?I provided 30 minutes of non-face-to-face time during this encounter. ?  ?  ?Lennox Grumbles, LCSW ?  ?12/20/2021 ?  ?

## 2021-12-21 ENCOUNTER — Other Ambulatory Visit: Payer: Self-pay

## 2021-12-21 ENCOUNTER — Encounter (HOSPITAL_COMMUNITY): Payer: Self-pay | Admitting: Psychiatry

## 2021-12-21 ENCOUNTER — Telehealth (INDEPENDENT_AMBULATORY_CARE_PROVIDER_SITE_OTHER): Payer: Medicare Other | Admitting: Psychiatry

## 2021-12-21 DIAGNOSIS — F901 Attention-deficit hyperactivity disorder, predominantly hyperactive type: Secondary | ICD-10-CM | POA: Diagnosis not present

## 2021-12-21 DIAGNOSIS — F332 Major depressive disorder, recurrent severe without psychotic features: Secondary | ICD-10-CM

## 2021-12-21 MED ORDER — CARBAMAZEPINE 200 MG PO TABS
ORAL_TABLET | ORAL | 2 refills | Status: DC
Start: 2021-12-21 — End: 2022-02-03

## 2021-12-21 NOTE — Progress Notes (Signed)
Virtual Visit via Video Note ? ?I connected with Destiny Orr on 12/21/21 at  2:00 PM EDT by a video enabled telemedicine application and verified that I am speaking with the correct person using two identifiers. ? ?Location: ?Patient: home ?Provider: office ?  ?I discussed the limitations of evaluation and management by telemedicine and the availability of in person appointments. The patient expressed understanding and agreed to proceed. ? ? ?  ?I discussed the assessment and treatment plan with the patient. The patient was provided an opportunity to ask questions and all were answered. The patient agreed with the plan and demonstrated an understanding of the instructions. ?  ?The patient was advised to call back or seek an in-person evaluation if the symptoms worsen or if the condition fails to improve as anticipated. ? ?I provided 25 minutes of non-face-to-face time during this encounter. ? ? ?[name], MD ? ?BH MD/PA/NP OP Progress Note ? ?12/21/2021 2:44 PM ?Destiny Orr  ?MRN:  [id number] ? ?Chief Complaint:  ?Chief Complaint  ?Patient presents with  ? Depression  ? Follow-up  ? ?HPI: This patient is a 41 year old separated white female who lives with her ex husband 1 son and 2 daughters in [place].  She was on disability from [employer] but now get Social Security disability. ? ?The patient returns today as a work in after 3 weeks.  She states that she spoke with her therapist recently and decided that her mood is very up-and-down and unstable.  This is complicated by the fact that she is having constant conflicts with her kids.  Her younger daughter posted a video online that really hurt her feelings.  In the video her younger daughter said that the older sister was the one who cared for the most took care of her and the one that she looked up to.  The patient was hoping that she would have said this about herself and not the sister.  She has been upset and tearful all day about this. ? ?She states that  nobody helps her around the house and that she is exhausted.  She is asked them to do chores and nobody complies including the ex-husband.  She developed chest pain last week and was seen in when a virtual visit but this is subsided.  She states that her mood goes from being very depressed to being extremely irritable and angry.  She claims that she has "no joy in my life."  In reviewing medications she has been on just about every antidepressant or mood stabilizer.  She has not tried Tegretol so we can try this but I think most of these problems are characterological.  Her mood seems very dependent on how people around her react to her as she has very  little of her own sense of self worth. ? ? ?Visit Diagnosis:  ?  ICD-10-CM   ?1. Major depressive disorder, recurrent, severe without psychotic features ([place])  F33.2   ?  ?2. Attention deficit hyperactivity disorder (ADHD), predominantly hyperactive type  F90.1   ?  ? ? ?Past Psychiatric History: Long-term outpatient treatment for depression and anxiety ? ?Past Medical History:  ?Past Medical History:  ?Diagnosis Date  ? Abnormal Papanicolaou smear of cervix with positive human papilloma virus (HPV) test 10/11/2018  ? LSIL +HPV, get colpo___  ? Abnormal uterine bleeding (AUB) 12/09/2014  ? Anxiety   ? Arthritis   ? Depression   ? DJD (degenerative joint disease)   ? Dumping syndrome   ?  Dysmenorrhea 07/15/2014  ? Fatigue 12/24/2012  ? Fibromyalgia diagnosed April 2016  ? Headache   ? Hx of migraine headaches 12/24/2012  ? Inappropriate sinus tachycardia   ? Irregular menstrual bleeding 07/15/2014  ? Pelvic pain in female 03/03/2016  ? Pneumothorax, spontaneous, tension   ? Post concussion syndrome   ? POTS (postural orthostatic tachycardia syndrome)   ? PTSD (post-traumatic stress disorder)   ? Scoliosis   ? Thyroid disease   ? Tick bite 03/03/2016  ? Vitamin B 12 deficiency   ?  ?Past Surgical History:  ?Procedure Laterality Date  ? bone spurs toes Right   ? CESAREAN SECTION     ? COLONOSCOPY    ? endooscopy    ? KNEE SURGERY    ? rotate cuff lt arm    ? ? ?Family Psychiatric History: See below ? ?Family History:  ?Family History  ?Problem Relation Age of Onset  ? Hypertension Father   ? Atrial fibrillation Father   ? Alcohol abuse Father   ? Cancer Maternal Grandmother   ?     skin   ? Heart disease Maternal Grandmother   ? Other Maternal Grandmother   ?     had thyroid removed  ? Breast cancer Maternal Grandmother   ? Heart disease Paternal Grandfather   ? COPD Paternal Grandfather   ? Hypertension Paternal Grandfather   ? Diabetes Paternal Grandfather   ? Stroke Paternal Grandfather   ? Cancer Maternal Grandfather   ?     bladder,lung  ? Anxiety disorder Maternal Aunt   ? Anxiety disorder Maternal Uncle   ? Bipolar disorder Maternal Uncle   ? Breast cancer Maternal Uncle   ?     CML  ? Leukemia Other   ? Colon cancer Other   ?     lung-2 maternal great uncles  ? ? ?Social History:  ?Social History  ? ?Socioeconomic History  ? Marital status: Legally Separated  ?  Spouse name: Not on file  ? Number of children: 3  ? Years of education: Not on file  ? Highest education level: Not on file  ?Occupational History  ? Not on file  ?Tobacco Use  ? Smoking status: Former  ?  Packs/day: 1.00  ?  Years: 15.00  ?  Pack years: 15.00  ?  Types: Cigarettes  ?  Start date: 03/13/1997  ?  Quit date: 09/25/2021  ?  Years since quitting: 0.2  ? Smokeless tobacco: Never  ? Tobacco comments:  ?  "working on it" Was smoking 2.5 packs a day  ?Vaping Use  ? Vaping Use: Never used  ?Substance and Sexual Activity  ? Alcohol use: No  ? Drug use: No  ? Sexual activity: Yes  ?  Partners: Male  ?  Birth control/protection: Pill  ?Other Topics Concern  ? Not on file  ?Social History Narrative  ? Not on file  ? ?Social Determinants of Health  ? ?Financial Resource Strain: Medium Risk  ? Difficulty of Paying Living Expenses: Somewhat hard  ?Food Insecurity: Food Insecurity Present  ? Worried About Charity fundraiser  in the Last Year: Sometimes true  ? Ran Out of Food in the Last Year: Never true  ?Transportation Needs: Unmet Transportation Needs  ? Lack of Transportation (Medical): Yes  ? Lack of Transportation (Non-Medical): Yes  ?Physical Activity: Inactive  ? Days of Exercise per Week: 0 days  ? Minutes of Exercise per Session: 0 min  ?Stress:  Stress Concern Present  ? Feeling of Stress : Very much  ?Social Connections: Unknown  ? Frequency of Communication with Friends and Family: Three times a week  ? Frequency of Social Gatherings with Friends and Family: Never  ? Attends Religious Services: Never  ? Active Member of Clubs or Organizations: No  ? Attends Archivist Meetings: Never  ? Marital Status: Not on file  ? ? ?Allergies:  ?Allergies  ?Allergen Reactions  ? Prozac [Fluoxetine]   ?  Suicidal thoughts  ? Topiramate Other (See Comments)  ?  Topamax-dizziness  ? Lexapro [Escitalopram Oxalate] Other (See Comments)  ?  Flat affect, No emotions  ? ? ?Metabolic Disorder Labs: ?Lab Results  ?Component Value Date  ? HGBA1C 5.4 01/07/2013  ? MPG 108 01/07/2013  ? ?Lab Results  ?Component Value Date  ? PROLACTIN 13.2 06/12/2016  ? ?Lab Results  ?Component Value Date  ? CHOL 204 (H) 01/07/2021  ? TRIG 132 01/07/2021  ? HDL 72 01/07/2021  ? CHOLHDL 2.8 01/07/2021  ? VLDL 31 01/07/2013  ? LDLCALC 109 (H) 01/07/2021  ? [place] 80 10/08/2018  ? ?Lab Results  ?Component Value Date  ? TSH 4.080 01/07/2021  ? TSH 1.971 10/24/2019  ? ? ?Therapeutic Level Labs: ?Lab Results  ?Component Value Date  ? LITHIUM 0.45 (L) 01/27/2020  ? LITHIUM 0.24 (L) 09/03/2019  ? ?No results found for: VALPROATE ?No components found for:  CBMZ ? ?Current Medications: ?Current Outpatient Medications  ?Medication Sig Dispense Refill  ? carbamazepine (TEGRETOL) 200 MG tablet Take one daily for one week, then increase to twice daily 60 tablet 2  ? ALPRAZolam (XANAX) 1 MG tablet Take 1 tablet (1 mg total) by mouth 4 (four) times daily. 120 tablet 2  ?  amphetamine-dextroamphetamine (ADDERALL) 20 MG tablet Take 1 tablet (20 mg total) by mouth 3 (three) times daily. 90 tablet 0  ? Cholecalciferol (VITAMIN D3) 250 MCG (10000 UT) capsule Take 10,000 U

## 2021-12-26 ENCOUNTER — Ambulatory Visit: Payer: Medicare Other | Admitting: Physician Assistant

## 2022-01-03 ENCOUNTER — Ambulatory Visit (HOSPITAL_COMMUNITY)
Admission: RE | Admit: 2022-01-03 | Discharge: 2022-01-03 | Disposition: A | Discharge status: Home / Self Care | Payer: Medicare Other | Source: Ambulatory Visit | Attending: Adult Health | Admitting: Adult Health

## 2022-01-03 ENCOUNTER — Encounter (HOSPITAL_COMMUNITY): Payer: Self-pay

## 2022-01-03 DIAGNOSIS — R922 Inconclusive mammogram: Secondary | ICD-10-CM | POA: Diagnosis not present

## 2022-01-03 DIAGNOSIS — N6311 Unspecified lump in the right breast, upper outer quadrant: Secondary | ICD-10-CM

## 2022-01-09 ENCOUNTER — Telehealth (INDEPENDENT_AMBULATORY_CARE_PROVIDER_SITE_OTHER): Payer: Medicare Other | Admitting: Psychiatry

## 2022-01-09 ENCOUNTER — Encounter (HOSPITAL_COMMUNITY): Payer: Self-pay | Admitting: Psychiatry

## 2022-01-09 DIAGNOSIS — F332 Major depressive disorder, recurrent severe without psychotic features: Secondary | ICD-10-CM

## 2022-01-09 MED ORDER — QUETIAPINE FUMARATE 50 MG PO TABS
50.0000 mg | ORAL_TABLET | Freq: Every day | ORAL | 2 refills | Status: DC
Start: 2022-01-09 — End: 2022-02-03

## 2022-01-09 NOTE — Progress Notes (Signed)
Virtual Visit via Video Note ? ?I connected with Destiny Orr on 01/09/22 at  3:00 PM EDT by a video enabled telemedicine application and verified that I am speaking with the correct person using two identifiers. ? ?Location: ?Patient: home ?Provider: home office ?  ?I discussed the limitations of evaluation and management by telemedicine and the availability of in person appointments. The patient expressed understanding and agreed to proceed. ? ? ? ?  ?I discussed the assessment and treatment plan with the patient. The patient was provided an opportunity to ask questions and all were answered. The patient agreed with the plan and demonstrated an understanding of the instructions. ?  ?The patient was advised to call back or seek an in-person evaluation if the symptoms worsen or if the condition fails to improve as anticipated. ? ?I provided 20 minutes of non-face-to-face time during this encounter. ? ? ?[name], MD ? ?BH MD/PA/NP OP Progress Note ? ?01/09/2022 3:32 PM ?Destiny Orr  ?MRN:  [id number] ? ?Chief Complaint:  ?Chief Complaint  ?Patient presents with  ? Anxiety  ? Depression  ? Follow-up  ? ?HPI: This patient is a 41 year old separated white female who lives with her ex husband 1 son and 2 daughters in [place].  She was on disability from [employer] but now get Social Security disability. ? ?The patient returns for follow-up after 3 weeks.  She states that she is not doing well.  She found out over the weekend that her 43 year old daughter is pregnant.  She is very angry and upset about this.  She states that the daughter and the boyfriend are not responsible enough to care for even their dog much less a baby.  She is taking it very personally.  She states she is so upset that she has not been able to eat.  She tried Phenergan but it did not help.  She does not have a primary care provider and refuses to take my suggestions about how to find 1. ? ?She has not been able to sleep and her thoughts are  racing so I added some Seroquel for today.  She is very upset but denies any thoughts of self-harm or suicide. ?Visit Diagnosis:  ?  ICD-10-CM   ?1. Major depressive disorder, recurrent, severe without psychotic features ([place])  F33.2   ?  ? ? ?Past Psychiatric History: Long-term outpatient treatment for depression and anxiety ? ?Past Medical History:  ?Past Medical History:  ?Diagnosis Date  ? Abnormal Papanicolaou smear of cervix with positive human papilloma virus (HPV) test 10/11/2018  ? LSIL +HPV, get colpo___  ? Abnormal uterine bleeding (AUB) 12/09/2014  ? Anxiety   ? Arthritis   ? Depression   ? DJD (degenerative joint disease)   ? Dumping syndrome   ? Dysmenorrhea 07/15/2014  ? Fatigue 12/24/2012  ? Fibromyalgia diagnosed April 2016  ? Headache   ? Hx of migraine headaches 12/24/2012  ? Inappropriate sinus tachycardia   ? Irregular menstrual bleeding 07/15/2014  ? Pelvic pain in female 03/03/2016  ? Pneumothorax, spontaneous, tension   ? Post concussion syndrome   ? POTS (postural orthostatic tachycardia syndrome)   ? PTSD (post-traumatic stress disorder)   ? Scoliosis   ? Thyroid disease   ? Tick bite 03/03/2016  ? Vitamin B 12 deficiency   ?  ?Past Surgical History:  ?Procedure Laterality Date  ? bone spurs toes Right   ? CESAREAN SECTION    ? COLONOSCOPY    ? endooscopy    ?  KNEE SURGERY    ? rotate cuff lt arm    ? ? ?Family Psychiatric History: see below ? ?Family History:  ?Family History  ?Problem Relation Age of Onset  ? Hypertension Father   ? Atrial fibrillation Father   ? Alcohol abuse Father   ? Cancer Maternal Grandmother   ?     skin   ? Heart disease Maternal Grandmother   ? Other Maternal Grandmother   ?     had thyroid removed  ? Breast cancer Maternal Grandmother   ? Heart disease Paternal Grandfather   ? COPD Paternal Grandfather   ? Hypertension Paternal Grandfather   ? Diabetes Paternal Grandfather   ? Stroke Paternal Grandfather   ? Cancer Maternal Grandfather   ?     bladder,lung  ? Anxiety  disorder Maternal Aunt   ? Anxiety disorder Maternal Uncle   ? Bipolar disorder Maternal Uncle   ? Breast cancer Maternal Uncle   ?     CML  ? Leukemia Other   ? Colon cancer Other   ?     lung-2 maternal great uncles  ? ? ?Social History:  ?Social History  ? ?Socioeconomic History  ? Marital status: Legally Separated  ?  Spouse name: Not on file  ? Number of children: 3  ? Years of education: Not on file  ? Highest education level: Not on file  ?Occupational History  ? Not on file  ?Tobacco Use  ? Smoking status: Former  ?  Packs/day: 1.00  ?  Years: 15.00  ?  Pack years: 15.00  ?  Types: Cigarettes  ?  Start date: 03/13/1997  ?  Quit date: 09/25/2021  ?  Years since quitting: 0.2  ? Smokeless tobacco: Never  ? Tobacco comments:  ?  "working on it" Was smoking 2.5 packs a day  ?Vaping Use  ? Vaping Use: Never used  ?Substance and Sexual Activity  ? Alcohol use: No  ? Drug use: No  ? Sexual activity: Yes  ?  Partners: Male  ?  Birth control/protection: Pill  ?Other Topics Concern  ? Not on file  ?Social History Narrative  ? Not on file  ? ?Social Determinants of Health  ? ?Financial Resource Strain: Not on file  ?Food Insecurity: Not on file  ?Transportation Needs: Not on file  ?Physical Activity: Not on file  ?Stress: Not on file  ?Social Connections: Not on file  ? ? ?Allergies:  ?Allergies  ?Allergen Reactions  ? Prozac [Fluoxetine]   ?  Suicidal thoughts  ? Topiramate Other (See Comments)  ?  Topamax-dizziness  ? Lexapro [Escitalopram Oxalate] Other (See Comments)  ?  Flat affect, No emotions  ? ? ?Metabolic Disorder Labs: ?Lab Results  ?Component Value Date  ? HGBA1C 5.4 01/07/2013  ? MPG 108 01/07/2013  ? ?Lab Results  ?Component Value Date  ? PROLACTIN 13.2 06/12/2016  ? ?Lab Results  ?Component Value Date  ? CHOL 204 (H) 01/07/2021  ? TRIG 132 01/07/2021  ? HDL 72 01/07/2021  ? CHOLHDL 2.8 01/07/2021  ? VLDL 31 01/07/2013  ? LDLCALC 109 (H) 01/07/2021  ? [place] 80 10/08/2018  ? ?Lab Results  ?Component Value  Date  ? TSH 4.080 01/07/2021  ? TSH 1.971 10/24/2019  ? ? ?Therapeutic Level Labs: ?Lab Results  ?Component Value Date  ? LITHIUM 0.45 (L) 01/27/2020  ? LITHIUM 0.24 (L) 09/03/2019  ? ?No results found for: VALPROATE ?No components found for:  CBMZ ? ?  Current Medications: ?Current Outpatient Medications  ?Medication Sig Dispense Refill  ? QUEtiapine (SEROQUEL) 50 MG tablet Take 1 tablet (50 mg total) by mouth at bedtime. 30 tablet 2  ? ALPRAZolam (XANAX) 1 MG tablet Take 1 tablet (1 mg total) by mouth 4 (four) times daily. 120 tablet 2  ? amphetamine-dextroamphetamine (ADDERALL) 20 MG tablet Take 1 tablet (20 mg total) by mouth 3 (three) times daily. 90 tablet 0  ? carbamazepine (TEGRETOL) 200 MG tablet Take one daily for one week, then increase to twice daily 60 tablet 2  ? Cholecalciferol (VITAMIN D3) 250 MCG (10000 UT) capsule Take 10,000 Units by mouth daily.    ? citalopram (CELEXA) 20 MG tablet Take 1 tablet (20 mg total) by mouth 2 (two) times daily. 60 tablet 2  ? Cyanocobalamin (B-12 PO) Take 1 tablet by mouth daily.    ? ibuprofen (ADVIL,MOTRIN) 200 MG tablet Take 400-600 mg by mouth every 6 (six) hours as needed for mild pain or moderate pain.    ? norethindrone (MICRONOR) 0.35 MG tablet Take 1 tablet (0.35 mg total) by mouth daily. 28 tablet 11  ? ?No current facility-administered medications for this visit.  ? ? ? ?Musculoskeletal: ?Strength & Muscle Tone: within normal limits ?Gait & Station: normal ?Patient leans: N/A ? ?Psychiatric Specialty Exam: ?Review of Systems  ?Gastrointestinal:  Positive for nausea.  ?Psychiatric/Behavioral:  Positive for dysphoric mood and sleep disturbance. The patient is nervous/anxious.   ?All other systems reviewed and are negative.  ?There were no vitals taken for this visit.There is no height or weight on file to calculate BMI.  ?General Appearance: Casual and Fairly Groomed  ?Eye Contact:  Fair  ?Speech:  Clear and Coherent  ?Volume:  Normal  ?Mood:  Anxious,  Depressed, and Irritable  ?Affect:  Depressed and Flat  ?Thought Process:  Goal Directed  ?Orientation:  Full (Time, Place, and Person)  ?Thought Content: Rumination   ?Suicidal Thoughts:  No  ?Homicidal Though

## 2022-01-10 ENCOUNTER — Ambulatory Visit (INDEPENDENT_AMBULATORY_CARE_PROVIDER_SITE_OTHER): Payer: Medicare Other | Admitting: Clinical

## 2022-01-10 DIAGNOSIS — F332 Major depressive disorder, recurrent severe without psychotic features: Secondary | ICD-10-CM | POA: Diagnosis not present

## 2022-01-10 DIAGNOSIS — F4310 Post-traumatic stress disorder, unspecified: Secondary | ICD-10-CM

## 2022-01-10 DIAGNOSIS — F901 Attention-deficit hyperactivity disorder, predominantly hyperactive type: Secondary | ICD-10-CM

## 2022-01-10 NOTE — Progress Notes (Signed)
Virtual Visit via Telephone Note ?  ?I connected with Destiny Orr on 01/10/22 at 4:00 PM EST by telephone and verified that I am speaking with the correct person using two identifiers. ?  ?Location: ?Patient: Home ?Provider: Office ?  ?I discussed the limitations, risks, security and privacy concerns of performing an evaluation and management service by telephone and the availability of in person appointments. I also discussed with the patient that there may be a patient responsible charge related to this service. The patient expressed understanding and agreed to proceed. ?  ?  ?THERAPIST PROGRESS NOTE ?  ?Session Time: 4:00 PM-4:55 PM ?  ?Participation Level: Active ?  ?Behavioral Response: CasualAlertDepressed ?  ?Type of Therapy: Individual Therapy ?  ?Treatment Goals addressed: Coping ?  ?Interventions: CBT, Motivational Interviewing, Strength-based and Supportive ?  ?Summary: Destiny Orr is a 41 y.o. female who presents with Depression and PTSD.The OPT therapist worked with the patient for her ongoing outpatient. OPT treatment. The OPT therapist utilized Motivational Interviewing to assist in creating therapeutic repore. The patient in the session was engaged and work in collaboration giving feedback about her triggers and symptoms over the past few weeks. The patient spoke about the large change in learning that her daughter is pregnant and the impact/stress this creates with the patients daughter and boyfriend currently living in the home with the patient.  The OPT therapist placed emphasis on the patients focusing on her own self care areas and creating some self time/ breaks in her week potentially outside of the home to assist the patient with managing the stress of the current at home environment.The OPT therapist utilized Cognitive Behavioral Therapy through cognitive restructuring as well as worked with the patient on self awareness and implementing coping strategies to assist in management  current  stressors of the patients oldest daughter and boyfriend living in the home and now learning the oldest daughter is pregnant.The OPT therapist provided ongoing psychoeducation  and support throughout the session with the patient.   ?  ?Suicidal/Homicidal: Nowithout intent/plan ?  ?Therapist Response: The OPT therapist worked with the patient for the patients scheduled session. The patient was engaged in her session and gave feedback in relation to triggers, symptoms, and behavior responses over the past few weeks including the impact of the patients oldest daughter and boyfriend living in the home as well as just learning a few days ago the daughter is pregnant.The OPT therapist worked with the patient utilizing an in session Cognitive Behavioral Therapy exercise. The patient was responsive in the session and verbalized, " I realize I have to try to take care of me and take breaks".  The OPT therapist  continued to work with the patient on communicating with her oldest daughter and setting in home expectations and communicating about how to co-exist in the home. The OPT therapist will continue treatment work with the patient in her next scheduled session ?  ?Plan: Return again in 2/3 weeks. ?  ?Diagnosis:      Axis I: Major depressive disorder, recurrent, severe without psychotic features and PTSD (post-traumatic stress disorder) ?  ?                        Axis II: No diagnosis ?  ?  ?  ?Collaboration of Care: Collaboration of Care (psychiatrist) Dr. Harrington Challenger.  ?  ?Patient/Guardian was advised Release of Information must be obtained prior to any record release in order to collaborate their care with  an outside provider. Patient/Guardian was advised if they have not already done so to contact the registration department to sign all necessary forms in order for [place] to release information regarding their care.  ?  ?Consent: Patient/Guardian gives verbal consent for treatment and assignment of benefits for services provided  during this visit. Patient/Guardian expressed understanding and agreed to proceed.  ?  ?I discussed the assessment and treatment plan with the patient. The patient was provided an opportunity to ask questions and all were answered. The patient agreed with the plan and demonstrated an understanding of the instructions. ?  ?The patient was advised to call back or seek an in-person evaluation if the symptoms worsen or if the condition fails to improve as anticipated. ?  ?I provided 55 minutes of non-face-to-face time during this encounter. ?  ?  ?Lennox Grumbles, LCSW ?  ?01/10/2022 ?

## 2022-01-26 ENCOUNTER — Telehealth (HOSPITAL_COMMUNITY): Payer: Self-pay | Admitting: Clinical

## 2022-01-26 ENCOUNTER — Telehealth (HOSPITAL_COMMUNITY): Payer: Self-pay

## 2022-01-26 ENCOUNTER — Ambulatory Visit (HOSPITAL_COMMUNITY): Payer: Medicare Other | Admitting: Clinical

## 2022-01-26 ENCOUNTER — Other Ambulatory Visit (HOSPITAL_COMMUNITY): Payer: Self-pay | Admitting: Psychiatry

## 2022-01-26 ENCOUNTER — Other Ambulatory Visit: Payer: Self-pay | Admitting: Adult Health

## 2022-01-26 NOTE — Telephone Encounter (Signed)
Medication refill - Telephone call with patient, after she left a message she is in need of a new Adderall 20 mg, 3 times a day order, last provided 12/02/21 and pt returns next to see Dr. Harrington Challenger on 02/03/22.  Informed Dr. Harrington Challenger was out this week but would send to covering provider to see if a new order would be approved as patient stated she would be out this weekend and does not see Dr.Ross until 02/03/22.Patient requests new order be sent to her [facility].  ?

## 2022-01-26 NOTE — Telephone Encounter (Signed)
The patient did not respond to contact attempts . VM was left ?

## 2022-01-27 MED ORDER — AMPHETAMINE-DEXTROAMPHETAMINE 20 MG PO TABS
20.0000 mg | ORAL_TABLET | Freq: Two times a day (BID) | ORAL | 0 refills | Status: DC
Start: 2022-01-27 — End: 2022-02-03

## 2022-01-27 NOTE — Telephone Encounter (Signed)
Medication management - Message left for patient that Dr. Adele Schilder sent in her requested new Adderall order but for 20 mg, one twice a day as Dr. Harrington Challenger documented from patient visit 01/09/22. Reminded pt of need to keep next appt with Dr. Harrington Challenger 02/03/22 and to call the office back upon Dr. Harrington Challenger return if any issues with dosage Dr. Adele Schilder had sent into her [place].  ?

## 2022-01-27 NOTE — Telephone Encounter (Signed)
As per Dr. Alan Ripper last note she is taking Adderall 20 mg 2 times a day.  I will send 60 pills to her pharmacy. ?

## 2022-02-03 ENCOUNTER — Encounter (HOSPITAL_COMMUNITY): Payer: Self-pay | Admitting: Psychiatry

## 2022-02-03 ENCOUNTER — Telehealth (INDEPENDENT_AMBULATORY_CARE_PROVIDER_SITE_OTHER): Payer: Medicare Other | Admitting: Psychiatry

## 2022-02-03 DIAGNOSIS — F411 Generalized anxiety disorder: Secondary | ICD-10-CM

## 2022-02-03 DIAGNOSIS — F332 Major depressive disorder, recurrent severe without psychotic features: Secondary | ICD-10-CM

## 2022-02-03 DIAGNOSIS — F901 Attention-deficit hyperactivity disorder, predominantly hyperactive type: Secondary | ICD-10-CM

## 2022-02-03 MED ORDER — AMPHETAMINE-DEXTROAMPHETAMINE 20 MG PO TABS: 20.0000 mg | ORAL_TABLET | Freq: Three times a day (TID) | ORAL | 0 refills | Status: DC

## 2022-02-03 MED ORDER — ALPRAZOLAM 1 MG PO TABS: 1.0000 mg | ORAL_TABLET | Freq: Four times a day (QID) | ORAL | 2 refills | Status: DC

## 2022-02-03 MED ORDER — QUETIAPINE FUMARATE 50 MG PO TABS: 50.0000 mg | ORAL_TABLET | Freq: Every day | ORAL | 2 refills | Status: DC

## 2022-02-03 MED ORDER — CITALOPRAM HYDROBROMIDE 20 MG PO TABS: 20.0000 mg | ORAL_TABLET | Freq: Two times a day (BID) | ORAL | 2 refills | Status: DC

## 2022-02-03 MED ORDER — CARBAMAZEPINE 200 MG PO TABS: ORAL_TABLET | ORAL | 2 refills | Status: DC

## 2022-02-03 NOTE — Progress Notes (Signed)
Virtual Visit via Video Note ? ?I connected with Destiny Orr on 02/03/22 at 10:20 AM EDT by a video enabled telemedicine application and verified that I am speaking with the correct person using two identifiers. ? ?Location: ?Patient: home ?Provider: office ?  ?I discussed the limitations of evaluation and management by telemedicine and the availability of in person appointments. The patient expressed understanding and agreed to proceed. ? ?  ?I discussed the assessment and treatment plan with the patient. The patient was provided an opportunity to ask questions and all were answered. The patient agreed with the plan and demonstrated an understanding of the instructions. ?  ?The patient was advised to call back or seek an in-person evaluation if the symptoms worsen or if the condition fails to improve as anticipated. ? ?I provided 30 minutes of non-face-to-face time during this encounter. ? ? ?[name], MD ? ?BH MD/PA/NP OP Progress Note ? ?02/03/2022 10:50 AM ?Destiny Orr  ?MRN:  [id number] ? ?Chief Complaint:  ?Chief Complaint  ?Patient presents with  ? Anxiety  ? Depression  ? Follow-up  ? ADD  ? ?HPI: This patient is a 41 year old separated white female who lives with her ex husband 1 son and 2 daughters in [place].  She was on disability from [employer] but now get Social Security disability. ? ?The patient returns for follow-up after 4 weeks.  She is very distraught today.  She states that no one in the family cares about her.  She states that she does everything for everyone else in nothing for herself.  She then says "I do not even care about myself."  She also states that she is used about her disability check money to pay for groceries and gas for the kids and nobody is helping her.  I explained to her that given that she is an adult and these kids are older teenagers that they are able to do quite a few things for themselves and she is going to need to start saying now.  She claims that she is does  not know how to do this and feels guilty if she does not.  I urged her to work on this in her therapy.  She states that while I was out last week the other doctor filled her Adderall incorrectly and she does better when she takes 20 mg 3 times a day instead of twice a day so I will fix this for her.  She spent most of the time today complaining about the family but not wanting to take measures to make changes such as setting limits and boundaries with them. ? ?Visit Diagnosis:  ?  ICD-10-CM   ?1. Major depressive disorder, recurrent, severe without psychotic features ([place])  F33.2   ?  ?2. Attention deficit hyperactivity disorder (ADHD), predominantly hyperactive type  F90.1   ?  ?3. GAD (generalized anxiety disorder)  F41.1   ?  ? ? ?Past Psychiatric History: Long-term outpatient treatment for depression and anxiety ? ?Past Medical History:  ?Past Medical History:  ?Diagnosis Date  ? Abnormal Papanicolaou smear of cervix with positive human papilloma virus (HPV) test 10/11/2018  ? LSIL +HPV, get colpo___  ? Abnormal uterine bleeding (AUB) 12/09/2014  ? Anxiety   ? Arthritis   ? Depression   ? DJD (degenerative joint disease)   ? Dumping syndrome   ? Dysmenorrhea 07/15/2014  ? Fatigue 12/24/2012  ? Fibromyalgia diagnosed April 2016  ? Headache   ? Hx of migraine headaches  12/24/2012  ? Inappropriate sinus tachycardia   ? Irregular menstrual bleeding 07/15/2014  ? Pelvic pain in female 03/03/2016  ? Pneumothorax, spontaneous, tension   ? Post concussion syndrome   ? POTS (postural orthostatic tachycardia syndrome)   ? PTSD (post-traumatic stress disorder)   ? Scoliosis   ? Thyroid disease   ? Tick bite 03/03/2016  ? Vitamin B 12 deficiency   ?  ?Past Surgical History:  ?Procedure Laterality Date  ? bone spurs toes Right   ? CESAREAN SECTION    ? COLONOSCOPY    ? endooscopy    ? KNEE SURGERY    ? rotate cuff lt arm    ? ? ?Family Psychiatric History: See below ? ?Family History:  ?Family History  ?Problem Relation Age of Onset   ? Hypertension Father   ? Atrial fibrillation Father   ? Alcohol abuse Father   ? Cancer Maternal Grandmother   ?     skin   ? Heart disease Maternal Grandmother   ? Other Maternal Grandmother   ?     had thyroid removed  ? Breast cancer Maternal Grandmother   ? Heart disease Paternal Grandfather   ? COPD Paternal Grandfather   ? Hypertension Paternal Grandfather   ? Diabetes Paternal Grandfather   ? Stroke Paternal Grandfather   ? Cancer Maternal Grandfather   ?     bladder,lung  ? Anxiety disorder Maternal Aunt   ? Anxiety disorder Maternal Uncle   ? Bipolar disorder Maternal Uncle   ? Breast cancer Maternal Uncle   ?     CML  ? Leukemia Other   ? Colon cancer Other   ?     lung-2 maternal great uncles  ? ? ?Social History:  ?Social History  ? ?Socioeconomic History  ? Marital status: Legally Separated  ?  Spouse name: Not on file  ? Number of children: 3  ? Years of education: Not on file  ? Highest education level: Not on file  ?Occupational History  ? Not on file  ?Tobacco Use  ? Smoking status: Former  ?  Packs/day: 1.00  ?  Years: 15.00  ?  Pack years: 15.00  ?  Types: Cigarettes  ?  Start date: 03/13/1997  ?  Quit date: 09/25/2021  ?  Years since quitting: 0.3  ? Smokeless tobacco: Never  ? Tobacco comments:  ?  "working on it" Was smoking 2.5 packs a day  ?Vaping Use  ? Vaping Use: Never used  ?Substance and Sexual Activity  ? Alcohol use: No  ? Drug use: No  ? Sexual activity: Yes  ?  Partners: Male  ?  Birth control/protection: Pill  ?Other Topics Concern  ? Not on file  ?Social History Narrative  ? Not on file  ? ?Social Determinants of Health  ? ?Financial Resource Strain: Not on file  ?Food Insecurity: Not on file  ?Transportation Needs: Not on file  ?Physical Activity: Not on file  ?Stress: Not on file  ?Social Connections: Not on file  ? ? ?Allergies:  ?Allergies  ?Allergen Reactions  ? Prozac [Fluoxetine]   ?  Suicidal thoughts  ? Topiramate Other (See Comments)  ?  Topamax-dizziness  ? Lexapro  [Escitalopram Oxalate] Other (See Comments)  ?  Flat affect, No emotions  ? ? ?Metabolic Disorder Labs: ?Lab Results  ?Component Value Date  ? HGBA1C 5.4 01/07/2013  ? MPG 108 01/07/2013  ? ?Lab Results  ?Component Value Date  ? PROLACTIN  13.2 06/12/2016  ? ?Lab Results  ?Component Value Date  ? CHOL 204 (H) 01/07/2021  ? TRIG 132 01/07/2021  ? HDL 72 01/07/2021  ? CHOLHDL 2.8 01/07/2021  ? VLDL 31 01/07/2013  ? LDLCALC 109 (H) 01/07/2021  ? [place] 80 10/08/2018  ? ?Lab Results  ?Component Value Date  ? TSH 4.080 01/07/2021  ? TSH 1.971 10/24/2019  ? ? ?Therapeutic Level Labs: ?Lab Results  ?Component Value Date  ? LITHIUM 0.45 (L) 01/27/2020  ? LITHIUM 0.24 (L) 09/03/2019  ? ?No results found for: VALPROATE ?No components found for:  CBMZ ? ?Current Medications: ?Current Outpatient Medications  ?Medication Sig Dispense Refill  ? ALPRAZolam (XANAX) 1 MG tablet Take 1 tablet (1 mg total) by mouth 4 (four) times daily. 120 tablet 2  ? amphetamine-dextroamphetamine (ADDERALL) 20 MG tablet Take 1 tablet (20 mg total) by mouth 3 (three) times daily. 90 tablet 0  ? carbamazepine (TEGRETOL) 200 MG tablet Take one daily for one week, then increase to twice daily 60 tablet 2  ? Cholecalciferol (VITAMIN D3) 250 MCG (10000 UT) capsule Take 10,000 Units by mouth daily.    ? citalopram (CELEXA) 20 MG tablet Take 1 tablet (20 mg total) by mouth 2 (two) times daily. 60 tablet 2  ? Cyanocobalamin (B-12 PO) Take 1 tablet by mouth daily.    ? ibuprofen (ADVIL,MOTRIN) 200 MG tablet Take 400-600 mg by mouth every 6 (six) hours as needed for mild pain or moderate pain.    ? norethindrone (MICRONOR) 0.35 MG tablet TAKE (1) TABLET BY MOUTH ONCE DAILY. 28 tablet 12  ? QUEtiapine (SEROQUEL) 50 MG tablet Take 1 tablet (50 mg total) by mouth at bedtime. 30 tablet 2  ? ?No current facility-administered medications for this visit.  ? ? ? ?Musculoskeletal: ?Strength & Muscle Tone: within normal limits ?Gait & Station: normal ?Patient leans:  N/A ? ?Psychiatric Specialty Exam: ?Review of Systems  ?Constitutional:  Positive for fatigue.  ?Neurological:  Positive for weakness.  ?Psychiatric/Behavioral:  Positive for dysphoric mood. The patient is n

## 2022-02-13 ENCOUNTER — Ambulatory Visit (INDEPENDENT_AMBULATORY_CARE_PROVIDER_SITE_OTHER): Payer: Medicare Other | Admitting: Clinical

## 2022-02-13 DIAGNOSIS — F332 Major depressive disorder, recurrent severe without psychotic features: Secondary | ICD-10-CM | POA: Diagnosis not present

## 2022-02-13 DIAGNOSIS — F4310 Post-traumatic stress disorder, unspecified: Secondary | ICD-10-CM

## 2022-02-13 NOTE — Progress Notes (Signed)
Virtual Visit via Telephone Note ?  ?I connected with Destiny Orr on 02/13/22 at 2:15 PM EST by telephone and verified that I am speaking with the correct person using two identifiers. ?  ?Location: ?Patient: Home ?Provider: Office ?  ?I discussed the limitations, risks, security and privacy concerns of performing an evaluation and management service by telephone and the availability of in person appointments. I also discussed with the patient that there may be a patient responsible charge related to this service. The patient expressed understanding and agreed to proceed. ?  ?  ?THERAPIST PROGRESS NOTE ?  ?Session Time: 2:15 PM-2:45 PM ?  ?Participation Level: Active ?  ?Behavioral Response: CasualAlertDepressed ?  ?Type of Therapy: Individual Therapy ?  ?Treatment Goals addressed: Coping ?  ?Interventions: CBT, Motivational Interviewing, Strength-based and Supportive ?  ?Summary: Destiny Orr is a 41 y.o. female who presents with Depression and PTSD.The OPT therapist worked with the patient for her ongoing outpatient. OPT treatment. The OPT therapist utilized Motivational Interviewing to assist in creating therapeutic repore. The patient in the session was engaged and work in collaboration giving feedback about her triggers and symptoms over the past few weeks. The patient spoke about  adjusting better to the large change in her daughter being pregnant. The patient spoke about going later tonight with her younger daughter to a concert in [place].  The OPT therapist placed emphasis on the patients focusing on her own self care areas and creating some self time/ breaks in her week potentially outside of the home to assist the patient with managing the stress of the current at home environment.The OPT therapist utilized Cognitive Behavioral Therapy through cognitive restructuring as well as worked with the patient on self awareness and implementing coping strategies to assist in management  current stressors.The OPT  therapist provided ongoing psychoeducation  and support throughout the session with the patient.   ?  ?Suicidal/Homicidal: Nowithout intent/plan ?  ?Therapist Response: The OPT therapist worked with the patient for the patients scheduled session. The patient was engaged in her session and gave feedback in relation to triggers, symptoms, and behavior responses over the past few weeks including the impact of the patients oldest daughter who is pregnant.The OPT therapist worked with the patient utilizing an in session Cognitive Behavioral Therapy exercise. The patient was responsive in the session and verbalized, " I am focusing on tonight and tomorrow and the concert".  The OPT therapist  continued to work with the patient on communicating with her oldest daughter and noted the oldest daughter who is pregnant may be moving out but giving the situation currently the patient is not sure at this point she wants the oldest to leave being worried about her health and ability to manage finances. The patient noted I want to take care of myself and I care more about my health cause I have a grandbaby coming and I want to be around for my daughter and grandchild. The OPT therapist will continue treatment work with the patient in her next scheduled session ?  ?Plan: Return again in 2/3 weeks. ?  ?Diagnosis:      Axis I: Major depressive disorder, recurrent, severe without psychotic features and PTSD (post-traumatic stress disorder) ?  ?                        Axis II: No diagnosis ?  ?  ?  ?Collaboration of Care: Collaboration of Care (psychiatrist) Dr. Harrington Challenger.  ?  ?  Patient/Guardian was advised Release of Information must be obtained prior to any record release in order to collaborate their care with an outside provider. Patient/Guardian was advised if they have not already done so to contact the registration department to sign all necessary forms in order for [place] to release information regarding their care.  ?  ?Consent:  Patient/Guardian gives verbal consent for treatment and assignment of benefits for services provided during this visit. Patient/Guardian expressed understanding and agreed to proceed.  ?  ?I discussed the assessment and treatment plan with the patient. The patient was provided an opportunity to ask questions and all were answered. The patient agreed with the plan and demonstrated an understanding of the instructions. ?  ?The patient was advised to call back or seek an in-person evaluation if the symptoms worsen or if the condition fails to improve as anticipated. ?  ?I provided 30 minutes of non-face-to-face time during this encounter. ?  ?  ?Lennox Grumbles, LCSW ?  ?02/13/2022 ?

## 2022-02-15 ENCOUNTER — Other Ambulatory Visit (HOSPITAL_COMMUNITY): Payer: Self-pay | Admitting: Psychiatry

## 2022-02-15 ENCOUNTER — Telehealth: Payer: Self-pay | Admitting: Adult Health

## 2022-02-15 ENCOUNTER — Telehealth (HOSPITAL_COMMUNITY): Payer: Self-pay | Admitting: *Deleted

## 2022-02-15 MED ORDER — ALPRAZOLAM 1 MG PO TABS: 1.0000 mg | ORAL_TABLET | Freq: Four times a day (QID) | ORAL | 2 refills | Status: DC

## 2022-02-15 NOTE — Telephone Encounter (Signed)
I sent it in with a note but I can't guarantee that they will fill it and insurance will not pay for early refill
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
## 2022-02-16 ENCOUNTER — Telehealth (HOSPITAL_COMMUNITY): Payer: Self-pay | Admitting: *Deleted

## 2022-02-16 ENCOUNTER — Other Ambulatory Visit (HOSPITAL_COMMUNITY): Payer: Self-pay | Admitting: Psychiatry

## 2022-02-16 MED ORDER — AMPHETAMINE-DEXTROAMPHETAMINE 20 MG PO TABS: 20.0000 mg | ORAL_TABLET | Freq: Three times a day (TID) | ORAL | 0 refills | Status: DC

## 2022-02-16 NOTE — Telephone Encounter (Signed)
Patient informed and he verbalized understanding.

## 2022-02-16 NOTE — Telephone Encounter (Signed)
Do she pick up the xanax?
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
## 2022-02-21 NOTE — Telephone Encounter (Signed)
Yes per pharmacist patient picked up her Xanax

## 2022-03-03 ENCOUNTER — Telehealth (INDEPENDENT_AMBULATORY_CARE_PROVIDER_SITE_OTHER): Payer: Medicare Other | Admitting: Psychiatry

## 2022-03-03 ENCOUNTER — Encounter (HOSPITAL_COMMUNITY): Payer: Self-pay | Admitting: Psychiatry

## 2022-03-03 DIAGNOSIS — F4310 Post-traumatic stress disorder, unspecified: Secondary | ICD-10-CM | POA: Diagnosis not present

## 2022-03-03 DIAGNOSIS — F901 Attention-deficit hyperactivity disorder, predominantly hyperactive type: Secondary | ICD-10-CM | POA: Diagnosis not present

## 2022-03-03 DIAGNOSIS — F411 Generalized anxiety disorder: Secondary | ICD-10-CM | POA: Diagnosis not present

## 2022-03-03 DIAGNOSIS — F332 Major depressive disorder, recurrent severe without psychotic features: Secondary | ICD-10-CM

## 2022-03-03 MED ORDER — ARIPIPRAZOLE 2 MG PO TABS: 2.0000 mg | ORAL_TABLET | Freq: Every day | ORAL | 2 refills | Status: DC

## 2022-03-03 MED ORDER — CITALOPRAM HYDROBROMIDE 20 MG PO TABS: 20.0000 mg | ORAL_TABLET | Freq: Two times a day (BID) | ORAL | 2 refills | Status: DC

## 2022-03-03 MED ORDER — AMPHETAMINE-DEXTROAMPHETAMINE 20 MG PO TABS: 20.0000 mg | ORAL_TABLET | Freq: Three times a day (TID) | ORAL | 0 refills | Status: DC

## 2022-03-03 MED ORDER — CARBAMAZEPINE 200 MG PO TABS: 200.0000 mg | ORAL_TABLET | Freq: Every day | ORAL | 2 refills | Status: DC

## 2022-03-03 MED ORDER — ALPRAZOLAM 1 MG PO TABS: 1.0000 mg | ORAL_TABLET | Freq: Four times a day (QID) | ORAL | 2 refills | Status: DC
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
## 2022-03-07 ENCOUNTER — Other Ambulatory Visit (HOSPITAL_COMMUNITY): Payer: Self-pay | Admitting: Nurse Practitioner

## 2022-03-07 ENCOUNTER — Ambulatory Visit (HOSPITAL_COMMUNITY)
Admission: RE | Admit: 2022-03-07 | Discharge: 2022-03-07 | Disposition: A | Discharge status: Home / Self Care | Payer: Medicare Other | Source: Ambulatory Visit | Attending: Nurse Practitioner | Admitting: Nurse Practitioner

## 2022-03-07 DIAGNOSIS — M545 Low back pain, unspecified: Secondary | ICD-10-CM

## 2022-03-07 DIAGNOSIS — M546 Pain in thoracic spine: Secondary | ICD-10-CM

## 2022-03-14 ENCOUNTER — Ambulatory Visit (INDEPENDENT_AMBULATORY_CARE_PROVIDER_SITE_OTHER): Payer: Medicare Other | Admitting: Clinical

## 2022-03-14 DIAGNOSIS — F4310 Post-traumatic stress disorder, unspecified: Secondary | ICD-10-CM

## 2022-03-14 DIAGNOSIS — F332 Major depressive disorder, recurrent severe without psychotic features: Secondary | ICD-10-CM | POA: Diagnosis not present
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
## 2022-04-04 ENCOUNTER — Ambulatory Visit: Payer: Medicare Other

## 2022-04-10 ENCOUNTER — Ambulatory Visit (INDEPENDENT_AMBULATORY_CARE_PROVIDER_SITE_OTHER): Payer: Medicare Other | Admitting: Clinical

## 2022-04-10 DIAGNOSIS — F332 Major depressive disorder, recurrent severe without psychotic features: Secondary | ICD-10-CM

## 2022-04-10 DIAGNOSIS — F4310 Post-traumatic stress disorder, unspecified: Secondary | ICD-10-CM
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
## 2022-04-11 ENCOUNTER — Encounter (HOSPITAL_COMMUNITY): Payer: Self-pay | Admitting: Psychiatry

## 2022-04-11 ENCOUNTER — Telehealth (INDEPENDENT_AMBULATORY_CARE_PROVIDER_SITE_OTHER): Payer: Medicare Other | Admitting: Psychiatry

## 2022-04-11 DIAGNOSIS — F901 Attention-deficit hyperactivity disorder, predominantly hyperactive type: Secondary | ICD-10-CM

## 2022-04-11 DIAGNOSIS — F332 Major depressive disorder, recurrent severe without psychotic features: Secondary | ICD-10-CM

## 2022-04-11 DIAGNOSIS — F411 Generalized anxiety disorder: Secondary | ICD-10-CM

## 2022-04-11 MED ORDER — ARIPIPRAZOLE 2 MG PO TABS: 2.0000 mg | ORAL_TABLET | Freq: Every day | ORAL | 2 refills | Status: DC

## 2022-04-11 MED ORDER — AMPHETAMINE-DEXTROAMPHETAMINE 20 MG PO TABS: 20.0000 mg | ORAL_TABLET | Freq: Three times a day (TID) | ORAL | 0 refills | Status: DC

## 2022-04-11 MED ORDER — ALPRAZOLAM 1 MG PO TABS: 1.0000 mg | ORAL_TABLET | Freq: Four times a day (QID) | ORAL | 2 refills | Status: DC

## 2022-04-11 MED ORDER — CITALOPRAM HYDROBROMIDE 20 MG PO TABS: 20.0000 mg | ORAL_TABLET | Freq: Two times a day (BID) | ORAL | 2 refills | Status: DC

## 2022-04-11 MED ORDER — CARBAMAZEPINE 200 MG PO TABS
200.0000 mg | ORAL_TABLET | Freq: Every day | ORAL | 2 refills | Status: DC
Start: 2022-04-11 — End: 2022-06-29

## 2022-04-11 NOTE — Progress Notes (Signed)
Virtual Visit via Video Note  I connected with Destiny Orr on 04/11/22 at 10:40 AM EDT by a video enabled telemedicine application and verified that I am speaking with the correct person using two identifiers.  Location: Patient: home Provider: office   I discussed the limitations of evaluation and management by telemedicine and the availability of in person appointments. The patient expressed understanding and agreed to proceed.     I discussed the assessment and treatment plan with the patient. The patient was provided an opportunity to ask questions and all were answered. The patient agreed with the plan and demonstrated an understanding of the instructions.   The patient was advised to call back or seek an in-person evaluation if the symptoms worsen or if the condition fails to improve as anticipated.  I provided 15 minutes of non-face-to-face time during this encounter.   [name], MD  [facility] MD/PA/NP OP Progress Note  04/11/2022 11:00 AM Destiny Orr  MRN:  [id number]  Chief Complaint:  Chief Complaint  Patient presents with   Depression   Anxiety   Follow-up   HPI: This patient is a 41 year old separated white female who lives with her ex husband 1 son and 2 daughters in [place].  She was on disability from [employer] but now get Social Security disability  The patient returns for follow-up after 6 weeks regarding her anxiety depression and ADD.  For the most part she is doing better.  Her daughter and daughter's boyfriend have moved out to their own appointment.  This daughter is expecting a baby in November.  Her other daughter is going to have more plastic surgery resultant from a car accident last summer.  She seems to be handling all of this very well.  She states her mood is still a bit up-and-down but she is getting up and doing more things.  She seems less depressed and anxious.  Most the time she is sleeping okay.  She denies any thoughts of self-harm or suicide.   Her current regimen seems to be working fairly well for her. Visit Diagnosis:    ICD-10-CM   1. Major depressive disorder, recurrent, severe without psychotic features ([place])  F33.2     2. Attention deficit hyperactivity disorder (ADHD), predominantly hyperactive type  F90.1     3. GAD (generalized anxiety disorder)  F41.1       Past Psychiatric History: Long-term outpatient treatment for depression and anxiety  Past Medical History:  Past Medical History:  Diagnosis Date   Abnormal Papanicolaou smear of cervix with positive human papilloma virus (HPV) test 10/11/2018   LSIL +HPV, get colpo___   Abnormal uterine bleeding (AUB) 12/09/2014   Anxiety    Arthritis    Depression    DJD (degenerative joint disease)    Dumping syndrome    Dysmenorrhea 07/15/2014   Fatigue 12/24/2012   Fibromyalgia diagnosed April 2016   Headache    Hx of migraine headaches 12/24/2012   Inappropriate sinus tachycardia    Irregular menstrual bleeding 07/15/2014   Pelvic pain in female 03/03/2016   Pneumothorax, spontaneous, tension    Post concussion syndrome    POTS (postural orthostatic tachycardia syndrome)    PTSD (post-traumatic stress disorder)    Scoliosis    Thyroid disease    Tick bite 03/03/2016   Vitamin B 12 deficiency     Past Surgical History:  Procedure Laterality Date   bone spurs toes Right    CESAREAN SECTION     COLONOSCOPY  endooscopy     KNEE SURGERY     rotate cuff lt arm      Family Psychiatric History: See below  Family History:  Family History  Problem Relation Age of Onset   Hypertension Father    Atrial fibrillation Father    Alcohol abuse Father    Cancer Maternal Grandmother        skin    Heart disease Maternal Grandmother    Other Maternal Grandmother        had thyroid removed   Breast cancer Maternal Grandmother    Heart disease Paternal Grandfather    COPD Paternal Grandfather    Hypertension Paternal Grandfather    Diabetes Paternal Grandfather     Stroke Paternal Grandfather    Cancer Maternal Grandfather        bladder,lung   Anxiety disorder Maternal Aunt    Anxiety disorder Maternal Uncle    Bipolar disorder Maternal Uncle    Breast cancer Maternal Uncle        CML   Leukemia Other    Colon cancer Other        lung-2 maternal great uncles    Social History:  Social History   Socioeconomic History   Marital status: Legally Separated    Spouse name: Not on file   Number of children: 3   Years of education: Not on file   Highest education level: Not on file  Occupational History   Not on file  Tobacco Use   Smoking status: Former    Packs/day: 1.00    Years: 15.00    Total pack years: 15.00    Types: Cigarettes    Start date: 03/13/1997    Quit date: 09/25/2021    Years since quitting: 0.5   Smokeless tobacco: Never   Tobacco comments:    "working on it" Was smoking 2.5 packs a day  Vaping Use   Vaping Use: Never used  Substance and Sexual Activity   Alcohol use: No   Drug use: No   Sexual activity: Yes    Partners: Male    Birth control/protection: Pill  Other Topics Concern   Not on file  Social History Narrative   Not on file   Social Determinants of Health   Financial Resource Strain: Medium Risk (01/07/2021)   Overall Financial Resource Strain (CARDIA)    Difficulty of Paying Living Expenses: Somewhat hard  Food Insecurity: Food Insecurity Present (01/07/2021)   Hunger Vital Sign    Worried About Running Out of Food in the Last Year: Sometimes true    Ran Out of Food in the Last Year: Never true  Transportation Needs: Unmet Transportation Needs (01/07/2021)   PRAPARE - Transportation    Lack of Transportation (Medical): Yes    Lack of Transportation (Non-Medical): Yes  Physical Activity: Inactive (01/07/2021)   Exercise Vital Sign    Days of Exercise per Week: 0 days    Minutes of Exercise per Session: 0 min  Stress: Stress Concern Present (01/07/2021)   [place]    Feeling of Stress : Very much  Social Connections: Unknown (01/07/2021)   Social Connection and Isolation Panel [NHANES]    Frequency of Communication with Friends and Family: Three times a week    Frequency of Social Gatherings with Friends and Family: Never    Attends Religious Services: Never    Marine scientist or Organizations: No    Attends Club or  Organization Meetings: Never    Marital Status: Not on file    Allergies:  Allergies  Allergen Reactions   Prozac [Fluoxetine]     Suicidal thoughts   Topiramate Other (See Comments)    Topamax-dizziness   Lexapro [Escitalopram Oxalate] Other (See Comments)    Flat affect, No emotions    Metabolic Disorder Labs: Lab Results  Component Value Date   HGBA1C 5.4 01/07/2013   MPG 108 01/07/2013   Lab Results  Component Value Date   PROLACTIN 13.2 06/12/2016   Lab Results  Component Value Date   CHOL 204 (H) 01/07/2021   TRIG 132 01/07/2021   HDL 72 01/07/2021   CHOLHDL 2.8 01/07/2021   VLDL 31 01/07/2013   LDLCALC 109 (H) 01/07/2021   LDLCALC 80 10/08/2018   Lab Results  Component Value Date   TSH 4.080 01/07/2021   TSH 1.971 10/24/2019    Therapeutic Level Labs: Lab Results  Component Value Date   LITHIUM 0.45 (L) 01/27/2020   LITHIUM 0.24 (L) 09/03/2019   No results found for: "VALPROATE" No results found for: "CBMZ"  Current Medications: Current Outpatient Medications  Medication Sig Dispense Refill   amphetamine-dextroamphetamine (ADDERALL) 20 MG tablet Take 1 tablet (20 mg total) by mouth 3 (three) times daily. 90 tablet 0   ALPRAZolam (XANAX) 1 MG tablet Take 1 tablet (1 mg total) by mouth 4 (four) times daily. 120 tablet 2   amphetamine-dextroamphetamine (ADDERALL) 20 MG tablet Take 1 tablet (20 mg total) by mouth 3 (three) times daily. Dx code F90.0 ADD 90 tablet 0   ARIPiprazole (ABILIFY) 2 MG tablet Take 1 tablet (2 mg total) by mouth daily. 30 tablet 2    carbamazepine (TEGRETOL) 200 MG tablet Take 1 tablet (200 mg total) by mouth daily. 30 tablet 2   Cholecalciferol (VITAMIN D3) 250 MCG (10000 UT) capsule Take 10,000 Units by mouth daily.     citalopram (CELEXA) 20 MG tablet Take 1 tablet (20 mg total) by mouth 2 (two) times daily. 60 tablet 2   Cyanocobalamin (B-12 PO) Take 1 tablet by mouth daily.     ibuprofen (ADVIL,MOTRIN) 200 MG tablet Take 400-600 mg by mouth every 6 (six) hours as needed for mild pain or moderate pain.     norethindrone (MICRONOR) 0.35 MG tablet TAKE (1) TABLET BY MOUTH ONCE DAILY. 28 tablet 12   No current facility-administered medications for this visit.     Musculoskeletal: Strength & Muscle Tone: within normal limits Gait & Station: normal Patient leans: N/A  Psychiatric Specialty Exam: Review of Systems  Constitutional:  Positive for fatigue.  All other systems reviewed and are negative.   There were no vitals taken for this visit.There is no height or weight on file to calculate BMI.  General Appearance: Casual and Fairly Groomed  Eye Contact:  Good  Speech:  Clear and Coherent  Volume:  Normal  Mood:  Euthymic  Affect:  Appropriate and Congruent  Thought Process:  Goal Directed  Orientation:  Full (Time, Place, and Person)  Thought Content: WDL   Suicidal Thoughts:  No  Homicidal Thoughts:  No  Memory:  Immediate;   Good Recent;   Good Remote;   Good  Judgement:  Good  Insight:  Fair  Psychomotor Activity:  Normal  Concentration:  Concentration: Good and Attention Span: Good  Recall:  Good  Fund of Knowledge: Good  Language: Good  Akathisia:  No  Handed:  Right  AIMS (if indicated): not done  Assets:  Communication Skills Desire for Improvement Physical Health Resilience Social Support Talents/Skills  ADL's:  Intact  Cognition: WNL  Sleep:  Fair   Screenings: GAD-7    Health and safety inspector from 05/31/2021 in [place] Office Visit  from 01/07/2021 in [facility]  Total GAD-7 Score 20 16      Mini-Mental    [place] Office Visit from 10/25/2016 in [facility] [place]  Total Score (max 30 points ) 29      PHQ2-9    Flowsheet Row Video Visit from 04/11/2022 in [place] Video Visit from 03/03/2022 in [place] Video Visit from 02/03/2022 in [place] Video Visit from 12/21/2021 in [facility] [facility] Video Visit from 12/02/2021 in [facility] [facility]  PHQ-2 Total Score '2 5 6 5 1  '$ PHQ-9 Total Score '6 13 12 14 '$ --      Flowsheet Row Video Visit from 04/11/2022 in [place] [facility] Video Visit from 03/03/2022 in [place] [facility] [facility] Video Visit from 02/03/2022 in [place] Error: Q3, 4, or 5 should not be populated when Q2 is No Error: Q3, 4, or 5 should not be populated when Q2 is No Error: Q3, 4, or 5 should not be populated when Q2 is No        Assessment and Plan: This patient is a 41 year old female with a history of depression dysthymia chronic fatigue social anxiety ADD and borderline personality traits.  For the most part she seems to be doing better in terms of mood and function.  She will continue Abilify 2 mg daily for augmentation, Celexa 20 mg twice daily for depression, Tegretol 200 mg daily for mood stabilization and Xanax 1 mg 4 times daily for anxiety and Adderall 20 mg 3 times daily for ADD.  She will return to see me in 2 months  Collaboration of Care: Collaboration of Care: Referral or follow-up with counselor/therapist AEB patient will continue therapy with Maye Hides in our office  Patient/Guardian was advised Release of Information must be obtained  prior to any record release in order to collaborate their care with an outside provider. Patient/Guardian was advised if they have not already done so to contact the registration department to sign all necessary forms in order for [place] to release information regarding their care.   Consent: Patient/Guardian gives verbal consent for treatment and assignment of benefits for services provided during this visit. Patient/Guardian expressed understanding and agreed to proceed.    [name], MD 04/11/2022, 11:00 AM

## 2022-05-08 ENCOUNTER — Encounter (HOSPITAL_COMMUNITY): Payer: Self-pay

## 2022-05-08 ENCOUNTER — Telehealth (HOSPITAL_COMMUNITY): Payer: Self-pay | Admitting: Clinical

## 2022-05-08 ENCOUNTER — Telehealth (HOSPITAL_COMMUNITY): Payer: Medicare Other | Admitting: Clinical

## 2022-05-08 NOTE — Telephone Encounter (Signed)
The patient did not respond to contact attempts.  

## 2022-05-29 ENCOUNTER — Ambulatory Visit (HOSPITAL_COMMUNITY): Payer: Medicare Other | Admitting: Clinical

## 2022-05-29 ENCOUNTER — Encounter (HOSPITAL_COMMUNITY): Payer: Self-pay

## 2022-06-08 ENCOUNTER — Telehealth (HOSPITAL_COMMUNITY): Payer: Self-pay | Admitting: *Deleted

## 2022-06-08 ENCOUNTER — Other Ambulatory Visit (HOSPITAL_COMMUNITY): Payer: Self-pay | Admitting: Psychiatry

## 2022-06-08 MED ORDER — AMPHETAMINE-DEXTROAMPHETAMINE 20 MG PO TABS: 20.0000 mg | ORAL_TABLET | Freq: Three times a day (TID) | ORAL | 0 refills | Status: DC

## 2022-06-08 NOTE — Telephone Encounter (Signed)
Patient appt was scheduled for 06/09/2022 and was rescheduled. Patient is needing refills for her Adderall.

## 2022-06-08 NOTE — Telephone Encounter (Signed)
sent 

## 2022-06-08 NOTE — Telephone Encounter (Signed)
Called patient and informed her with what provider stated and she verbalized understanding.

## 2022-06-09 ENCOUNTER — Telehealth (HOSPITAL_COMMUNITY): Payer: Medicare Other | Admitting: Psychiatry

## 2022-06-09 ENCOUNTER — Ambulatory Visit (HOSPITAL_COMMUNITY): Payer: Medicare Other | Admitting: Clinical

## 2022-06-09 ENCOUNTER — Telehealth (HOSPITAL_COMMUNITY): Payer: Self-pay | Admitting: Clinical

## 2022-06-09 NOTE — Telephone Encounter (Signed)
No show

## 2022-06-25 NOTE — Progress Notes (Signed)
No show

## 2022-06-29 ENCOUNTER — Telehealth (INDEPENDENT_AMBULATORY_CARE_PROVIDER_SITE_OTHER): Payer: Medicare Other | Admitting: Psychiatry

## 2022-06-29 ENCOUNTER — Encounter (HOSPITAL_COMMUNITY): Payer: Self-pay | Admitting: Psychiatry

## 2022-06-29 DIAGNOSIS — F901 Attention-deficit hyperactivity disorder, predominantly hyperactive type: Secondary | ICD-10-CM

## 2022-06-29 DIAGNOSIS — F332 Major depressive disorder, recurrent severe without psychotic features: Secondary | ICD-10-CM

## 2022-06-29 DIAGNOSIS — F411 Generalized anxiety disorder: Secondary | ICD-10-CM

## 2022-06-29 MED ORDER — CITALOPRAM HYDROBROMIDE 20 MG PO TABS: 20.0000 mg | ORAL_TABLET | Freq: Two times a day (BID) | ORAL | 2 refills | Status: DC

## 2022-06-29 MED ORDER — ARIPIPRAZOLE 2 MG PO TABS: 2.0000 mg | ORAL_TABLET | Freq: Every day | ORAL | 2 refills | Status: DC

## 2022-06-29 MED ORDER — ALPRAZOLAM 1 MG PO TABS: 1.0000 mg | ORAL_TABLET | Freq: Four times a day (QID) | ORAL | 2 refills | Status: DC

## 2022-06-29 MED ORDER — AMPHETAMINE-DEXTROAMPHETAMINE 20 MG PO TABS: 20.0000 mg | ORAL_TABLET | Freq: Three times a day (TID) | ORAL | 0 refills | Status: DC

## 2022-06-29 MED ORDER — CARBAMAZEPINE 200 MG PO TABS
200.0000 mg | ORAL_TABLET | Freq: Every day | ORAL | 2 refills | Status: DC
Start: 2022-06-29 — End: 2022-08-10

## 2022-06-29 NOTE — Progress Notes (Signed)
Virtual Visit via Video Note  I connected with Jaryn Sydnor on 06/29/22 at  9:00 AM EDT by a video enabled telemedicine application and verified that I am speaking with the correct person using two identifiers.  Location: Patient: home Provider: office   I discussed the limitations of evaluation and management by telemedicine and the availability of in person appointments. The patient expressed understanding and agreed to proceed.     I discussed the assessment and treatment plan with the patient. The patient was provided an opportunity to ask questions and all were answered. The patient agreed with the plan and demonstrated an understanding of the instructions.   The patient was advised to call back or seek an in-person evaluation if the symptoms worsen or if the condition fails to improve as anticipated.  I provided 15 minutes of non-face-to-face time during this encounter.   [name], MD  [facility] MD/PA/NP OP Progress Note  06/29/2022 9:20 AM Aliviah Spain  MRN:  [id number]  Chief Complaint:  Chief Complaint  Patient presents with   Anxiety   Depression   ADD   Follow-up   HPI: This patient is a 41 year old separated white female who lives with her ex husband 1 son and 1 daughter in [place].  She was on disability from [employer] but now get Social Security disability  The patient returns for follow-up after about 2 months.  She states that she is very worried about her 40 year old daughter who is pregnant.  She has been having episodes of hypertension and is being closely watched by OB/GYN and going to weekly visits.  She is probably going to be delivering the baby early.  The patient has been going over to her daughter's apartment quite a bit been reassuring and helpful.  This is taking up all of her mental energy.  She states that her mood is still up and down but she is primarily just focusing on her daughter.  She denies any thoughts of self-harm or suicide.  For now her  regimen seems to be working fairly well and she is staying focused. Visit Diagnosis:    ICD-10-CM   1. Major depressive disorder, recurrent, severe without psychotic features ([place])  F33.2     2. Attention deficit hyperactivity disorder (ADHD), predominantly hyperactive type  F90.1     3. GAD (generalized anxiety disorder)  F41.1       Past Psychiatric History: Long-term outpatient treatment for depression and anxiety  Past Medical History:  Past Medical History:  Diagnosis Date   Abnormal Papanicolaou smear of cervix with positive human papilloma virus (HPV) test 10/11/2018   LSIL +HPV, get colpo___   Abnormal uterine bleeding (AUB) 12/09/2014   Anxiety    Arthritis    Depression    DJD (degenerative joint disease)    Dumping syndrome    Dysmenorrhea 07/15/2014   Fatigue 12/24/2012   Fibromyalgia diagnosed April 2016   Headache    Hx of migraine headaches 12/24/2012   Inappropriate sinus tachycardia    Irregular menstrual bleeding 07/15/2014   Pelvic pain in female 03/03/2016   Pneumothorax, spontaneous, tension    Post concussion syndrome    POTS (postural orthostatic tachycardia syndrome)    PTSD (post-traumatic stress disorder)    Scoliosis    Thyroid disease    Tick bite 03/03/2016   Vitamin B 12 deficiency     Past Surgical History:  Procedure Laterality Date   bone spurs toes Right    CESAREAN SECTION  COLONOSCOPY     endooscopy     KNEE SURGERY     rotate cuff lt arm      Family Psychiatric History: See below  Family History:  Family History  Problem Relation Age of Onset   Hypertension Father    Atrial fibrillation Father    Alcohol abuse Father    Cancer Maternal Grandmother        skin    Heart disease Maternal Grandmother    Other Maternal Grandmother        had thyroid removed   Breast cancer Maternal Grandmother    Heart disease Paternal Grandfather    COPD Paternal Grandfather    Hypertension Paternal Grandfather    Diabetes Paternal  Grandfather    Stroke Paternal Grandfather    Cancer Maternal Grandfather        bladder,lung   Anxiety disorder Maternal Aunt    Anxiety disorder Maternal Uncle    Bipolar disorder Maternal Uncle    Breast cancer Maternal Uncle        CML   Leukemia Other    Colon cancer Other        lung-2 maternal great uncles    Social History:  Social History   Socioeconomic History   Marital status: Legally Separated    Spouse name: Not on file   Number of children: 3   Years of education: Not on file   Highest education level: Not on file  Occupational History   Not on file  Tobacco Use   Smoking status: Former    Packs/day: 1.00    Years: 15.00    Total pack years: 15.00    Types: Cigarettes    Start date: 03/13/1997    Quit date: 09/25/2021    Years since quitting: 0.7   Smokeless tobacco: Never   Tobacco comments:    "working on it" Was smoking 2.5 packs a day  Vaping Use   Vaping Use: Never used  Substance and Sexual Activity   Alcohol use: No   Drug use: No   Sexual activity: Yes    Partners: Male    Birth control/protection: Pill  Other Topics Concern   Not on file  Social History Narrative   Not on file   Social Determinants of Health   Financial Resource Strain: Medium Risk (01/07/2021)   Overall Financial Resource Strain (CARDIA)    Difficulty of Paying Living Expenses: Somewhat hard  Food Insecurity: Food Insecurity Present (01/07/2021)   Hunger Vital Sign    Worried About Running Out of Food in the Last Year: Sometimes true    Ran Out of Food in the Last Year: Never true  Transportation Needs: Unmet Transportation Needs (01/07/2021)   PRAPARE - Transportation    Lack of Transportation (Medical): Yes    Lack of Transportation (Non-Medical): Yes  Physical Activity: Inactive (01/07/2021)   Exercise Vital Sign    Days of Exercise per Week: 0 days    Minutes of Exercise per Session: 0 min  Stress: Stress Concern Present (01/07/2021)   [place]    Feeling of Stress : Very much  Social Connections: Unknown (01/07/2021)   Social Connection and Isolation Panel [NHANES]    Frequency of Communication with Friends and Family: Three times a week    Frequency of Social Gatherings with Friends and Family: Never    Attends Religious Services: Never    Marine scientist or Organizations: No  Attends Archivist Meetings: Never    Marital Status: Not on file    Allergies:  Allergies  Allergen Reactions   Prozac [Fluoxetine]     Suicidal thoughts   Topiramate Other (See Comments)    Topamax-dizziness   Lexapro [Escitalopram Oxalate] Other (See Comments)    Flat affect, No emotions    Metabolic Disorder Labs: Lab Results  Component Value Date   HGBA1C 5.4 01/07/2013   MPG 108 01/07/2013   Lab Results  Component Value Date   PROLACTIN 13.2 06/12/2016   Lab Results  Component Value Date   CHOL 204 (H) 01/07/2021   TRIG 132 01/07/2021   HDL 72 01/07/2021   CHOLHDL 2.8 01/07/2021   VLDL 31 01/07/2013   LDLCALC 109 (H) 01/07/2021   LDLCALC 80 10/08/2018   Lab Results  Component Value Date   TSH 4.080 01/07/2021   TSH 1.971 10/24/2019    Therapeutic Level Labs: Lab Results  Component Value Date   LITHIUM 0.45 (L) 01/27/2020   LITHIUM 0.24 (L) 09/03/2019   No results found for: "VALPROATE" No results found for: "CBMZ"  Current Medications: Current Outpatient Medications  Medication Sig Dispense Refill   ALPRAZolam (XANAX) 1 MG tablet Take 1 tablet (1 mg total) by mouth 4 (four) times daily. 120 tablet 2   amphetamine-dextroamphetamine (ADDERALL) 20 MG tablet Take 1 tablet (20 mg total) by mouth 3 (three) times daily. Dx code F90.0 ADD 90 tablet 0   amphetamine-dextroamphetamine (ADDERALL) 20 MG tablet Take 1 tablet (20 mg total) by mouth 3 (three) times daily. 90 tablet 0   ARIPiprazole (ABILIFY) 2 MG tablet Take 1 tablet (2 mg total) by mouth  daily. 30 tablet 2   carbamazepine (TEGRETOL) 200 MG tablet Take 1 tablet (200 mg total) by mouth daily. 30 tablet 2   Cholecalciferol (VITAMIN D3) 250 MCG (10000 UT) capsule Take 10,000 Units by mouth daily.     citalopram (CELEXA) 20 MG tablet Take 1 tablet (20 mg total) by mouth 2 (two) times daily. 60 tablet 2   Cyanocobalamin (B-12 PO) Take 1 tablet by mouth daily.     ibuprofen (ADVIL,MOTRIN) 200 MG tablet Take 400-600 mg by mouth every 6 (six) hours as needed for mild pain or moderate pain.     norethindrone (MICRONOR) 0.35 MG tablet TAKE (1) TABLET BY MOUTH ONCE DAILY. 28 tablet 12   No current facility-administered medications for this visit.     Musculoskeletal: Strength & Muscle Tone: within normal limits Gait & Station: normal Patient leans: N/A  Psychiatric Specialty Exam: Review of Systems  Psychiatric/Behavioral:  The patient is nervous/anxious.   All other systems reviewed and are negative.   There were no vitals taken for this visit.There is no height or weight on file to calculate BMI.  General Appearance: Casual and Fairly Groomed  Eye Contact:  Good  Speech:  Clear and Coherent  Volume:  Normal  Mood:  Euthymic  Affect:  Congruent  Thought Process:  Goal Directed  Orientation:  Full (Time, Place, and Person)  Thought Content: Rumination   Suicidal Thoughts:  No  Homicidal Thoughts:  No  Memory:  Immediate;   Good Recent;   Good Remote;   Fair  Judgement:  Good  Insight:  Good  Psychomotor Activity:  Normal  Concentration:  Concentration: Good and Attention Span: Good  Recall:  Good  Fund of Knowledge: Good  Language: Good  Akathisia:  No  Handed:  Right  AIMS (if indicated): not  done  Assets:  Communication Skills Desire for Improvement Resilience Social Support Talents/Skills  ADL's:  Intact  Cognition: WNL  Sleep:  Good   Screenings: GAD-7    Flowsheet Row Counselor from 05/31/2021 in [place] Office Visit from 01/07/2021 in [facility]  Total GAD-7 Score 20 16      Mini-Mental    [place] Office Visit from 10/25/2016 in [facility]  Total Score (max 30 points ) 29      PHQ2-9    Flowsheet Row Video Visit from 06/29/2022 in [place] Video Visit from 04/11/2022 in [place] Video Visit from 03/03/2022 in [place] Video Visit from 02/03/2022 in [facility] Video Visit from 12/21/2021 in [facility]  PHQ-2 Total Score '1 2 5 6 5  '$ PHQ-9 Total Score -- '6 13 12 14      '$ Flowsheet Row Video Visit from 06/29/2022 in [facility] Video Visit from 04/11/2022 in [facility] Video Visit from 03/03/2022 in [place] No Risk Error: Q3, 4, or 5 should not be populated when Q2 is No Error: Q3, 4, or 5 should not be populated when Q2 is No        Assessment and Plan: This patient is a 41 year old female with a history of depression dysthymia chronic fatigue social anxiety ADD and borderline personality traits.  For now she is focusing primarily on her daughter's pregnancy.  She seems to be stable on her current regimen.  She will continue Celexa 20 mg twice daily for depression, Tegretol 200 mg daily for mood stabilization, Abilify 2 mg daily for augmentation, Xanax 1 mg 4 times daily for anxiety and Adderall 20 mg 3 times daily for ADD.  She will return to see me in 6 weeks  Collaboration of Care: Collaboration of Care: Referral or follow-up with counselor/therapist AEB patient will continue therapy with Maye Hides in our office  Patient/Guardian was advised Release of Information must  be obtained prior to any record release in order to collaborate their care with an outside provider. Patient/Guardian was advised if they have not already done so to contact the registration department to sign all necessary forms in order for [place] to release information regarding their care.   Consent: Patient/Guardian gives verbal consent for treatment and assignment of benefits for services provided during this visit. Patient/Guardian expressed understanding and agreed to proceed.    [name], MD 06/29/2022, 9:20 AM

## 2022-07-03 ENCOUNTER — Encounter: Payer: Self-pay | Admitting: Obstetrics & Gynecology

## 2022-07-03 ENCOUNTER — Ambulatory Visit (INDEPENDENT_AMBULATORY_CARE_PROVIDER_SITE_OTHER): Payer: Medicare Other | Admitting: Obstetrics & Gynecology

## 2022-07-03 VITALS — BP 148/98 | HR 70 | Ht 65.0 in | Wt 169.0 lb

## 2022-07-03 DIAGNOSIS — N946 Dysmenorrhea, unspecified: Secondary | ICD-10-CM

## 2022-07-03 DIAGNOSIS — N938 Other specified abnormal uterine and vaginal bleeding: Secondary | ICD-10-CM | POA: Diagnosis not present

## 2022-07-03 DIAGNOSIS — Z01419 Encounter for gynecological examination (general) (routine) without abnormal findings: Secondary | ICD-10-CM

## 2022-07-03 NOTE — Progress Notes (Signed)
Subjective:     Destiny Orr is a 41 y.o. female here for a routine exam.  Patient's last menstrual period was 06/24/2022 (exact date). [id number] Birth Control Method:  abstinence and condoms Menstrual Calendar(currently): irregular  Current complaints: periods dysmenorrhea and intermenstrual pain.   Current acute medical issues:  none   Recent Gynecologic History Patient's last menstrual period was 06/24/2022 (exact date). Last Pap: 4/21,  normal Last mammogram: 4/23,  normal  Past Medical History:  Diagnosis Date   Abnormal Papanicolaou smear of cervix with positive human papilloma virus (HPV) test 10/11/2018   LSIL +HPV, get colpo___   Abnormal uterine bleeding (AUB) 12/09/2014   Anxiety    Arthritis    Depression    DJD (degenerative joint disease)    Dumping syndrome    Dysmenorrhea 07/15/2014   Fatigue 12/24/2012   Fibromyalgia diagnosed April 2016   Headache    Hx of migraine headaches 12/24/2012   Inappropriate sinus tachycardia    Irregular menstrual bleeding 07/15/2014   Pelvic pain in female 03/03/2016   Pneumothorax, spontaneous, tension    Post concussion syndrome    POTS (postural orthostatic tachycardia syndrome)    PTSD (post-traumatic stress disorder)    Scoliosis    Thyroid disease    Tick bite 03/03/2016   Vitamin B 12 deficiency     Past Surgical History:  Procedure Laterality Date   bone spurs toes Right    CESAREAN SECTION     COLONOSCOPY     endooscopy     KNEE SURGERY     rotate cuff lt arm      OB History     Gravida  3   Para  2   Term  2   Preterm      AB  1   Living  3      SAB      IAB      Ectopic      Multiple  1   Live Births  3           Social History   Socioeconomic History   Marital status: Legally Separated    Spouse name: Not on file   Number of children: 3   Years of education: Not on file   Highest education level: Not on file  Occupational History   Not on file  Tobacco Use   Smoking status:  Former    Packs/day: 1.00    Years: 15.00    Total pack years: 15.00    Types: Cigarettes    Start date: 03/13/1997    Quit date: 09/25/2021    Years since quitting: 0.7   Smokeless tobacco: Never   Tobacco comments:    "working on it" Was smoking 2.5 packs a day  Vaping Use   Vaping Use: Never used  Substance and Sexual Activity   Alcohol use: No   Drug use: No   Sexual activity: Yes    Partners: Male    Birth control/protection: Pill  Other Topics Concern   Not on file  Social History Narrative   Not on file   Social Determinants of Health   Financial Resource Strain: High Risk (07/03/2022)   Overall Financial Resource Strain (CARDIA)    Difficulty of Paying Living Expenses: Very hard  Food Insecurity: Food Insecurity Present (07/03/2022)   Hunger Vital Sign    Worried About Running Out of Food in the Last Year: Sometimes true    Ran Out of Food in the  Last Year: Never true  Transportation Needs: No Transportation Needs (07/03/2022)   PRAPARE - Hydrologist (Medical): No    Lack of Transportation (Non-Medical): No  Physical Activity: Inactive (01/07/2021)   Exercise Vital Sign    Days of Exercise per Week: 0 days    Minutes of Exercise per Session: 0 min  Stress: Stress Concern Present (07/03/2022)   [place]    Feeling of Stress : Very much  Social Connections: Moderately Integrated (07/03/2022)   Social Connection and Isolation Panel [NHANES]    Frequency of Communication with Friends and Family: More than three times a week    Frequency of Social Gatherings with Friends and Family: Once a week    Attends Religious Services: 1 to 4 times per year    Active Member of [employer] or Organizations: No    Attends Music therapist: Never    Marital Status: Married    Family History  Problem Relation Age of Onset   Hypertension Father    Atrial fibrillation Father     Alcohol abuse Father    Cancer Maternal Grandmother        skin    Heart disease Maternal Grandmother    Other Maternal Grandmother        had thyroid removed   Breast cancer Maternal Grandmother    Heart disease Paternal Grandfather    COPD Paternal Grandfather    Hypertension Paternal Grandfather    Diabetes Paternal Grandfather    Stroke Paternal Grandfather    Cancer Maternal Grandfather        bladder,lung   Anxiety disorder Maternal Aunt    Anxiety disorder Maternal Uncle    Bipolar disorder Maternal Uncle    Breast cancer Maternal Uncle        CML   Leukemia Other    Colon cancer Other        lung-2 maternal great uncles     Current Outpatient Medications:    ALPRAZolam (XANAX) 1 MG tablet, Take 1 tablet (1 mg total) by mouth 4 (four) times daily., Disp: 120 tablet, Rfl: 2   amphetamine-dextroamphetamine (ADDERALL) 20 MG tablet, Take 1 tablet (20 mg total) by mouth 3 (three) times daily. Dx code F90.0 ADD, Disp: 90 tablet, Rfl: 0   carbamazepine (TEGRETOL) 200 MG tablet, Take 1 tablet (200 mg total) by mouth daily., Disp: 30 tablet, Rfl: 2   citalopram (CELEXA) 20 MG tablet, Take 1 tablet (20 mg total) by mouth 2 (two) times daily., Disp: 60 tablet, Rfl: 2   ibuprofen (ADVIL,MOTRIN) 200 MG tablet, Take 400-600 mg by mouth every 6 (six) hours as needed for mild pain or moderate pain., Disp: , Rfl:    amphetamine-dextroamphetamine (ADDERALL) 20 MG tablet, Take 1 tablet (20 mg total) by mouth 3 (three) times daily. (Patient not taking: Reported on 07/03/2022), Disp: 90 tablet, Rfl: 0   norethindrone (MICRONOR) 0.35 MG tablet, TAKE (1) TABLET BY MOUTH ONCE DAILY. (Patient not taking: Reported on 07/03/2022), Disp: 28 tablet, Rfl: 12  Review of Systems  Review of Systems  Constitutional: Negative for fever, chills, weight loss, malaise/fatigue and diaphoresis.  HENT: Negative for hearing loss, ear pain, nosebleeds, congestion, sore throat, neck pain, tinnitus and ear discharge.    Eyes: Negative for blurred vision, double vision, photophobia, pain, discharge and redness.  Respiratory: Negative for cough, hemoptysis, sputum production, shortness of breath, wheezing and stridor.   Cardiovascular: Negative for  chest pain, palpitations, orthopnea, claudication, leg swelling and PND.  Gastrointestinal: negative for abdominal pain. Negative for heartburn, nausea, vomiting, diarrhea, constipation, blood in stool and melena.  Genitourinary: Negative for dysuria, urgency, frequency, hematuria and flank pain.  Musculoskeletal: Negative for myalgias, back pain, joint pain and falls.  Skin: Negative for itching and rash.  Neurological: Negative for dizziness, tingling, tremors, sensory change, speech change, focal weakness, seizures, loss of consciousness, weakness and headaches.  Endo/Heme/Allergies: Negative for environmental allergies and polydipsia. Does not bruise/bleed easily.  Psychiatric/Behavioral: Negative for depression, suicidal ideas, hallucinations, memory loss and substance abuse. The patient is not nervous/anxious and does not have insomnia.        Objective:  Blood pressure (!) 148/98, pulse 70, height '5\' 5"'$  (1.651 m), weight 169 lb (76.7 kg), last menstrual period 06/24/2022.   Physical Exam  Vitals reviewed. Constitutional: She is oriented to person, place, and time. She appears well-developed and well-nourished.  HENT:  Head: Normocephalic and atraumatic.        Right Ear: External ear normal.  Left Ear: External ear normal.  Nose: Nose normal.  Mouth/Throat: Oropharynx is clear and moist.  Eyes: Conjunctivae and EOM are normal. Pupils are equal, round, and reactive to light. Right eye exhibits no discharge. Left eye exhibits no discharge. No scleral icterus.  Neck: Normal range of motion. Neck supple. No tracheal deviation present. No thyromegaly present.  Cardiovascular: Normal rate, regular rhythm, normal heart sounds and intact distal pulses.  Exam  reveals no gallop and no friction rub.   No murmur heard. Respiratory: Effort normal and breath sounds normal. No respiratory distress. She has no wheezes. She has no rales. She exhibits no tenderness.  GI: Soft. Bowel sounds are normal. She exhibits no distension and no mass. There is no tenderness. There is no rebound and no guarding.  Genitourinary:  Breasts no masses skin changes or nipple changes bilaterally      Vulva is normal without lesions Vagina is pink moist without discharge Cervix normal in appearance and pap is done Uterus is normal size shape and contour-->OK for TVH trial vs robotic Adnexa is negative with normal sized ovaries   Musculoskeletal: Normal range of motion. She exhibits no edema and no tenderness.  Neurological: She is alert and oriented to person, place, and time. She has normal reflexes. She displays normal reflexes. No cranial nerve deficit. She exhibits normal muscle tone. Coordination normal.  Skin: Skin is warm and dry. No rash noted. No erythema. No pallor.  Psychiatric: She has a normal mood and affect. Her behavior is normal. Judgment and thought content normal.       Medications Ordered at today's visit: No orders of the defined types were placed in this encounter.   Other orders placed at today's visit: No orders of the defined types were placed in this encounter.     Assessment:    Normal Gyn exam.    Plan:    Contraception: abstinence and condoms. Follow up in: prn months.   Diona Foley is in her court but she will need a hysterectomy  No follow-ups on file.

## 2022-08-10 ENCOUNTER — Encounter (HOSPITAL_COMMUNITY): Payer: Self-pay | Admitting: Psychiatry

## 2022-08-10 ENCOUNTER — Telehealth (INDEPENDENT_AMBULATORY_CARE_PROVIDER_SITE_OTHER): Payer: Medicare Other | Admitting: Psychiatry

## 2022-08-10 ENCOUNTER — Encounter (HOSPITAL_COMMUNITY): Payer: Self-pay

## 2022-08-10 DIAGNOSIS — F901 Attention-deficit hyperactivity disorder, predominantly hyperactive type: Secondary | ICD-10-CM

## 2022-08-10 DIAGNOSIS — F411 Generalized anxiety disorder: Secondary | ICD-10-CM

## 2022-08-10 DIAGNOSIS — F332 Major depressive disorder, recurrent severe without psychotic features: Secondary | ICD-10-CM | POA: Diagnosis not present

## 2022-08-10 MED ORDER — ALPRAZOLAM 1 MG PO TABS: 1.0000 mg | ORAL_TABLET | Freq: Four times a day (QID) | ORAL | 2 refills | Status: DC

## 2022-08-10 MED ORDER — CITALOPRAM HYDROBROMIDE 20 MG PO TABS: 20.0000 mg | ORAL_TABLET | Freq: Two times a day (BID) | ORAL | 2 refills | Status: DC

## 2022-08-10 MED ORDER — CARBAMAZEPINE 200 MG PO TABS
200.0000 mg | ORAL_TABLET | Freq: Two times a day (BID) | ORAL | 2 refills | Status: DC
Start: 2022-08-10 — End: 2022-09-14

## 2022-08-10 MED ORDER — CARBAMAZEPINE 200 MG PO TABS
200.0000 mg | ORAL_TABLET | Freq: Every day | ORAL | 2 refills | Status: DC
Start: 2022-08-10 — End: 2022-08-10

## 2022-08-10 MED ORDER — AMPHETAMINE-DEXTROAMPHETAMINE 20 MG PO TABS: 20.0000 mg | ORAL_TABLET | Freq: Three times a day (TID) | ORAL | 0 refills | Status: DC

## 2022-08-10 MED ORDER — ARIPIPRAZOLE 2 MG PO TABS: 2.0000 mg | ORAL_TABLET | Freq: Every day | ORAL | 2 refills | Status: DC

## 2022-08-10 NOTE — Progress Notes (Signed)
Virtual Visit via Video Note  I connected with Destiny Orr on 08/10/22 at  9:40 AM EST by a video enabled telemedicine application and verified that I am speaking with the correct person using two identifiers.  Location: Patient: home Provider: office   I discussed the limitations of evaluation and management by telemedicine and the availability of in person appointments. The patient expressed understanding and agreed to proceed.     I discussed the assessment and treatment plan with the patient. The patient was provided an opportunity to ask questions and all were answered. The patient agreed with the plan and demonstrated an understanding of the instructions.   The patient was advised to call back or seek an in-person evaluation if the symptoms worsen or if the condition fails to improve as anticipated.  I provided 15 minutes of non-face-to-face time during this encounter.   [name], MD  [facility] MD/PA/NP OP Progress Note  08/10/2022 9:55 AM Destiny Orr  MRN:  [id number]  Chief Complaint:  Chief Complaint  Patient presents with   Depression   Anxiety   Follow-up   HPI:  This patient is a 41 year old separated white female who lives with her ex husband 1 son and 1 daughter in [place].  She was on disability from [employer] but now get Social Security disability   The patient returns for follow-up after about 6 weeks.  She states that her granddaughter was born on 08/01/2022.  She has been helping a lot with her daughter and the baby.  The baby's father is also been very helpful.  She has been losing a lot of sleep but tries to sleep when she can.  The baby is only 31 days old so there is still a lot due to healthy young family.  The patient denies significant depression although she was crying a lot prior to the baby being born.  She was holding the baby today and seem to be in good spirits.  She denies any thoughts of self-harm or suicide.  For now her regimen seems to be  working fairly well and she is staying focused. Visit Diagnosis:    ICD-10-CM   1. Major depressive disorder, recurrent, severe without psychotic features ([place])  F33.2     2. Attention deficit hyperactivity disorder (ADHD), predominantly hyperactive type  F90.1     3. GAD (generalized anxiety disorder)  F41.1       Past Psychiatric History: Long-term outpatient treatment for depression and anxiety  Past Medical History:  Past Medical History:  Diagnosis Date   Abnormal Papanicolaou smear of cervix with positive human papilloma virus (HPV) test 10/11/2018   LSIL +HPV, get colpo___   Abnormal uterine bleeding (AUB) 12/09/2014   Anxiety    Arthritis    Depression    DJD (degenerative joint disease)    Dumping syndrome    Dysmenorrhea 07/15/2014   Fatigue 12/24/2012   Fibromyalgia diagnosed April 2016   Headache    Hx of migraine headaches 12/24/2012   Inappropriate sinus tachycardia    Irregular menstrual bleeding 07/15/2014   Pelvic pain in female 03/03/2016   Pneumothorax, spontaneous, tension    Post concussion syndrome    POTS (postural orthostatic tachycardia syndrome)    PTSD (post-traumatic stress disorder)    Scoliosis    Thyroid disease    Tick bite 03/03/2016   Vitamin B 12 deficiency     Past Surgical History:  Procedure Laterality Date   bone spurs toes Right  CESAREAN SECTION     COLONOSCOPY     endooscopy     KNEE SURGERY     rotate cuff lt arm      Family Psychiatric History: See below  Family History:  Family History  Problem Relation Age of Onset   Hypertension Father    Atrial fibrillation Father    Alcohol abuse Father    Cancer Maternal Grandmother        skin    Heart disease Maternal Grandmother    Other Maternal Grandmother        had thyroid removed   Breast cancer Maternal Grandmother    Heart disease Paternal Grandfather    COPD Paternal Grandfather    Hypertension Paternal Grandfather    Diabetes Paternal Grandfather    Stroke  Paternal Grandfather    Cancer Maternal Grandfather        bladder,lung   Anxiety disorder Maternal Aunt    Anxiety disorder Maternal Uncle    Bipolar disorder Maternal Uncle    Breast cancer Maternal Uncle        CML   Leukemia Other    Colon cancer Other        lung-2 maternal great uncles    Social History:  Social History   Socioeconomic History   Marital status: Legally Separated    Spouse name: Not on file   Number of children: 3   Years of education: Not on file   Highest education level: Not on file  Occupational History   Not on file  Tobacco Use   Smoking status: Former    Packs/day: 1.00    Years: 15.00    Total pack years: 15.00    Types: Cigarettes    Start date: 03/13/1997    Quit date: 09/25/2021    Years since quitting: 0.8   Smokeless tobacco: Never   Tobacco comments:    "working on it" Was smoking 2.5 packs a day  Vaping Use   Vaping Use: Never used  Substance and Sexual Activity   Alcohol use: No   Drug use: No   Sexual activity: Yes    Partners: Male    Birth control/protection: Pill  Other Topics Concern   Not on file  Social History Narrative   Not on file   Social Determinants of Health   Financial Resource Strain: High Risk (07/03/2022)   Overall Financial Resource Strain (CARDIA)    Difficulty of Paying Living Expenses: Very hard  Food Insecurity: Food Insecurity Present (07/03/2022)   Hunger Vital Sign    Worried About Running Out of Food in the Last Year: Sometimes true    Ran Out of Food in the Last Year: Never true  Transportation Needs: No Transportation Needs (07/03/2022)   PRAPARE - Hydrologist (Medical): No    Lack of Transportation (Non-Medical): No  Physical Activity: Inactive (01/07/2021)   Exercise Vital Sign    Days of Exercise per Week: 0 days    Minutes of Exercise per Session: 0 min  Stress: Stress Concern Present (07/03/2022)   [place]    Feeling of Stress : Very much  Social Connections: Moderately Integrated (07/03/2022)   Social Connection and Isolation Panel [NHANES]    Frequency of Communication with Friends and Family: More than three times a week    Frequency of Social Gatherings with Friends and Family: Once a week    Attends Religious Services: 1  to 4 times per year    Active Member of Clubs or Organizations: No    Attends Archivist Meetings: Never    Marital Status: Married    Allergies:  Allergies  Allergen Reactions   Prozac [Fluoxetine]     Suicidal thoughts   Topiramate Other (See Comments)    Topamax-dizziness   Lexapro [Escitalopram Oxalate] Other (See Comments)    Flat affect, No emotions    Metabolic Disorder Labs: Lab Results  Component Value Date   HGBA1C 5.4 01/07/2013   MPG 108 01/07/2013   Lab Results  Component Value Date   PROLACTIN 13.2 06/12/2016   Lab Results  Component Value Date   CHOL 204 (H) 01/07/2021   TRIG 132 01/07/2021   HDL 72 01/07/2021   CHOLHDL 2.8 01/07/2021   VLDL 31 01/07/2013   LDLCALC 109 (H) 01/07/2021   LDLCALC 80 10/08/2018   Lab Results  Component Value Date   TSH 4.080 01/07/2021   TSH 1.971 10/24/2019    Therapeutic Level Labs: Lab Results  Component Value Date   LITHIUM 0.45 (L) 01/27/2020   LITHIUM 0.24 (L) 09/03/2019   No results found for: "VALPROATE" No results found for: "CBMZ"  Current Medications: Current Outpatient Medications  Medication Sig Dispense Refill   ALPRAZolam (XANAX) 1 MG tablet Take 1 tablet (1 mg total) by mouth 4 (four) times daily. 120 tablet 2   amphetamine-dextroamphetamine (ADDERALL) 20 MG tablet Take 1 tablet (20 mg total) by mouth 3 (three) times daily. Dx code F90.0 ADD 90 tablet 0   amphetamine-dextroamphetamine (ADDERALL) 20 MG tablet Take 1 tablet (20 mg total) by mouth 3 (three) times daily. 90 tablet 0   carbamazepine (TEGRETOL) 200 MG tablet Take 1 tablet (200 mg  total) by mouth daily. 30 tablet 2   citalopram (CELEXA) 20 MG tablet Take 1 tablet (20 mg total) by mouth 2 (two) times daily. 60 tablet 2   ibuprofen (ADVIL,MOTRIN) 200 MG tablet Take 400-600 mg by mouth every 6 (six) hours as needed for mild pain or moderate pain.     norethindrone (MICRONOR) 0.35 MG tablet TAKE (1) TABLET BY MOUTH ONCE DAILY. (Patient not taking: Reported on 07/03/2022) 28 tablet 12   No current facility-administered medications for this visit.     Musculoskeletal: Strength & Muscle Tone: within normal limits Gait & Station: normal Patient leans: N/A  Psychiatric Specialty Exam: Review of Systems  All other systems reviewed and are negative.   There were no vitals taken for this visit.There is no height or weight on file to calculate BMI.  General Appearance: Casual and Fairly Groomed  Eye Contact:  Good  Speech:  Clear and Coherent  Volume:  Normal  Mood:  Euthymic  Affect:  Congruent  Thought Process:  Goal Directed  Orientation:  Full (Time, Place, and Person)  Thought Content: Rumination   Suicidal Thoughts:  No  Homicidal Thoughts:  No  Memory:  Immediate;   Good Recent;   Good Remote;   Good  Judgement:  Good  Insight:  Fair  Psychomotor Activity:  Normal  Concentration:  Concentration: Good and Attention Span: Good  Recall:  Good  Fund of Knowledge: Good  Language: Good  Akathisia:  No  Handed:  Right  AIMS (if indicated): not done  Assets:  Communication Skills Desire for Improvement Resilience Social Support Talents/Skills  ADL's:  Intact  Cognition: WNL  Sleep:  Fair   Screenings: GAD-7    Personnel officer Visit from  07/03/2022 in [place] from 05/31/2021 in [place] Office Visit from 01/07/2021 in [facility]  Total GAD-7 Score '21 20 16      '$ Mini-Mental    [place] Office Visit from 10/25/2016 in [facility]  Total Score  (max 30 points ) 29      PHQ2-9    [place] Visit from 07/03/2022 in [place] Video Visit from 06/29/2022 in [place] Video Visit from 04/11/2022 in [place] Video Visit from 03/03/2022 in [place] Video Visit from 02/03/2022 in [facility]  PHQ-2 Total Score '6 1 2 5 6  '$ PHQ-9 Total Score 21 -- '6 13 12      '$ Flowsheet Row Video Visit from 06/29/2022 in [facility] Video Visit from 04/11/2022 in [facility] Video Visit from 03/03/2022 in [place] No Risk Error: Q3, 4, or 5 should not be populated when Q2 is No Error: Q3, 4, or 5 should not be populated when Q2 is No        Assessment and Plan: This patient is a 41 year old female with a history of depression dysthymia chronic fatigue social anxiety ADD and borderline personality traits.  She is very focused on the new granddaughter and this seems to be taking up all of her time and energy.  She still thinks her regimen is working well for her.  She will continue Celexa 20 mg twice daily for depression, Tegretol 200 mg daily for mood stabilization, Abilify 2 mg daily for augmentation, Xanax 1 mg 4 times daily for anxiety and Adderall 20 mg 3 times daily for ADD.  She will return to see me in 6 weeks  Collaboration of Care: Collaboration of Care: Referral or follow-up with counselor/therapist AEB we will continue therapy with Maye Hides in our office  Patient/Guardian was advised Release of Information must be obtained prior to any record release in order to collaborate their care with an outside provider. Patient/Guardian was advised if they have not already done so to contact the registration  department to sign all necessary forms in order for [place] to release information regarding their care.   Consent: Patient/Guardian gives verbal consent for treatment and assignment of benefits for services provided during this visit. Patient/Guardian expressed understanding and agreed to proceed.    [name], MD 08/10/2022, 9:55 AM

## 2022-08-28 ENCOUNTER — Encounter: Payer: Self-pay | Admitting: Obstetrics & Gynecology

## 2022-08-29 MED ORDER — MEGESTROL ACETATE 40 MG PO TABS: ORAL_TABLET | ORAL | 3 refills | Status: DC

## 2022-09-01 ENCOUNTER — Telehealth: Payer: Self-pay | Admitting: Obstetrics & Gynecology

## 2022-09-01 NOTE — Telephone Encounter (Signed)
Pt is requesting a call from Dr. Abbott Pao is having trouble with the medication. Headaches, dizzness, weak, moods are worse. Please advise

## 2022-09-05 ENCOUNTER — Ambulatory Visit: Payer: Medicare Other | Admitting: Obstetrics & Gynecology

## 2022-09-14 ENCOUNTER — Encounter (HOSPITAL_COMMUNITY): Payer: Self-pay | Admitting: Psychiatry

## 2022-09-14 ENCOUNTER — Telehealth (INDEPENDENT_AMBULATORY_CARE_PROVIDER_SITE_OTHER): Payer: Medicare Other | Admitting: Psychiatry

## 2022-09-14 ENCOUNTER — Encounter: Payer: Self-pay | Admitting: *Deleted

## 2022-09-14 DIAGNOSIS — F411 Generalized anxiety disorder: Secondary | ICD-10-CM | POA: Diagnosis not present

## 2022-09-14 DIAGNOSIS — F901 Attention-deficit hyperactivity disorder, predominantly hyperactive type: Secondary | ICD-10-CM

## 2022-09-14 DIAGNOSIS — F332 Major depressive disorder, recurrent severe without psychotic features: Secondary | ICD-10-CM | POA: Diagnosis not present

## 2022-09-14 MED ORDER — CITALOPRAM HYDROBROMIDE 20 MG PO TABS: 20.0000 mg | ORAL_TABLET | Freq: Every day | ORAL | 2 refills | Status: DC

## 2022-09-14 MED ORDER — ARIPIPRAZOLE 2 MG PO TABS: 2.0000 mg | ORAL_TABLET | Freq: Every day | ORAL | 2 refills | Status: DC

## 2022-09-14 MED ORDER — MIRTAZAPINE 15 MG PO TABS: 15.0000 mg | ORAL_TABLET | Freq: Every day | ORAL | 2 refills | Status: DC

## 2022-09-14 MED ORDER — ALPRAZOLAM 1 MG PO TABS: 1.0000 mg | ORAL_TABLET | Freq: Four times a day (QID) | ORAL | 2 refills | Status: DC

## 2022-09-14 MED ORDER — CARBAMAZEPINE 200 MG PO TABS: 200.0000 mg | ORAL_TABLET | Freq: Two times a day (BID) | ORAL | 2 refills | Status: DC

## 2022-09-14 MED ORDER — AMPHETAMINE-DEXTROAMPHETAMINE 20 MG PO TABS: 20.0000 mg | ORAL_TABLET | Freq: Three times a day (TID) | ORAL | 0 refills | Status: DC

## 2022-09-14 NOTE — Progress Notes (Signed)
Virtual Visit via Video Note  I connected with Destiny Orr on 09/14/22 at 11:40 AM EST by a video enabled telemedicine application and verified that I am speaking with the correct person using two identifiers.  Location: Patient: home Provider: office   I discussed the limitations of evaluation and management by telemedicine and the availability of in person appointments. The patient expressed understanding and agreed to proceed.     I discussed the assessment and treatment plan with the patient. The patient was provided an opportunity to ask questions and all were answered. The patient agreed with the plan and demonstrated an understanding of the instructions.   The patient was advised to call back or seek an in-person evaluation if the symptoms worsen or if the condition fails to improve as anticipated.  I provided 20 minutes of non-face-to-face time during this encounter.   [name], MD  [facility] MD/PA/NP OP Progress Note  09/14/2022 12:03 PM Destiny Orr  MRN:  [id number]  Chief Complaint:  Chief Complaint  Patient presents with   Anxiety   Depression   Follow-up   HPI: This patient is a 41 year old separated white female who lives with her ex husband 1 son and 1 daughter in [place].  She was on disability from [employer] but now get Social Security disability    The patient returns for follow-up after about 4 weeks.  She states that she has not been doing well lately.  She is fed up with everything.  Financially she is struggling and cannot get much help from her ex-husband and they are having trouble paying for food and mortgage payment etc.  She states that she make sure her kids have food but she is not eating much.  She states when she tries to eat she cannot swallow.  She claims she is lost 13 pounds.  Also at night her thoughts are racing and she cannot sleep despite the Xanax.  I suggest we add mirtazapine at night and she is willing to try this.  She is still taking  care of her granddaughter quite a bit and seems to enjoy this but nothing much else in her life.  She denies thoughts of self-harm or suicide Visit Diagnosis:    ICD-10-CM   1. Major depressive disorder, recurrent, severe without psychotic features ([place])  F33.2     2. Attention deficit hyperactivity disorder (ADHD), predominantly hyperactive type  F90.1     3. GAD (generalized anxiety disorder)  F41.1       Past Psychiatric History: Long-term outpatient treatment for depression and anxiety  Past Medical History:  Past Medical History:  Diagnosis Date   Abnormal Papanicolaou smear of cervix with positive human papilloma virus (HPV) test 10/11/2018   LSIL +HPV, get colpo___   Abnormal uterine bleeding (AUB) 12/09/2014   Anxiety    Arthritis    Depression    DJD (degenerative joint disease)    Dumping syndrome    Dysmenorrhea 07/15/2014   Fatigue 12/24/2012   Fibromyalgia diagnosed April 2016   Headache    Hx of migraine headaches 12/24/2012   Inappropriate sinus tachycardia    Irregular menstrual bleeding 07/15/2014   Pelvic pain in female 03/03/2016   Pneumothorax, spontaneous, tension    Post concussion syndrome    POTS (postural orthostatic tachycardia syndrome)    PTSD (post-traumatic stress disorder)    Scoliosis    Thyroid disease    Tick bite 03/03/2016   Vitamin B 12 deficiency  Past Surgical History:  Procedure Laterality Date   bone spurs toes Right    CESAREAN SECTION     COLONOSCOPY     endooscopy     KNEE SURGERY     rotate cuff lt arm      Family Psychiatric History: See below  Family History:  Family History  Problem Relation Age of Onset   Hypertension Father    Atrial fibrillation Father    Alcohol abuse Father    Cancer Maternal Grandmother        skin    Heart disease Maternal Grandmother    Other Maternal Grandmother        had thyroid removed   Breast cancer Maternal Grandmother    Heart disease Paternal Grandfather    COPD Paternal  Grandfather    Hypertension Paternal Grandfather    Diabetes Paternal Grandfather    Stroke Paternal Grandfather    Cancer Maternal Grandfather        bladder,lung   Anxiety disorder Maternal Aunt    Anxiety disorder Maternal Uncle    Bipolar disorder Maternal Uncle    Breast cancer Maternal Uncle        CML   Leukemia Other    Colon cancer Other        lung-2 maternal great uncles    Social History:  Social History   Socioeconomic History   Marital status: Legally Separated    Spouse name: Not on file   Number of children: 3   Years of education: Not on file   Highest education level: Not on file  Occupational History   Not on file  Tobacco Use   Smoking status: Former    Packs/day: 1.00    Years: 15.00    Total pack years: 15.00    Types: Cigarettes    Start date: 03/13/1997    Quit date: 09/25/2021    Years since quitting: 0.9   Smokeless tobacco: Never   Tobacco comments:    "working on it" Was smoking 2.5 packs a day  Vaping Use   Vaping Use: Never used  Substance and Sexual Activity   Alcohol use: No   Drug use: No   Sexual activity: Yes    Partners: Male    Birth control/protection: Pill  Other Topics Concern   Not on file  Social History Narrative   Not on file   Social Determinants of Health   Financial Resource Strain: High Risk (07/03/2022)   Overall Financial Resource Strain (CARDIA)    Difficulty of Paying Living Expenses: Very hard  Food Insecurity: Food Insecurity Present (07/03/2022)   Hunger Vital Sign    Worried About Running Out of Food in the Last Year: Sometimes true    Ran Out of Food in the Last Year: Never true  Transportation Needs: No Transportation Needs (07/03/2022)   PRAPARE - Hydrologist (Medical): No    Lack of Transportation (Non-Medical): No  Physical Activity: Inactive (01/07/2021)   Exercise Vital Sign    Days of Exercise per Week: 0 days    Minutes of Exercise per Session: 0 min  Stress:  Stress Concern Present (07/03/2022)   [place]    Feeling of Stress : Very much  Social Connections: Moderately Integrated (07/03/2022)   Social Connection and Isolation Panel [NHANES]    Frequency of Communication with Friends and Family: More than three times a week    Frequency of  Social Gatherings with Friends and Family: Once a week    Attends Religious Services: 1 to 4 times per year    Active Member of [employer] or Organizations: No    Attends Archivist Meetings: Never    Marital Status: Married    Allergies:  Allergies  Allergen Reactions   Prozac [Fluoxetine]     Suicidal thoughts   Topiramate Other (See Comments)    Topamax-dizziness   Lexapro [Escitalopram Oxalate] Other (See Comments)    Flat affect, No emotions    Metabolic Disorder Labs: Lab Results  Component Value Date   HGBA1C 5.4 01/07/2013   MPG 108 01/07/2013   Lab Results  Component Value Date   PROLACTIN 13.2 06/12/2016   Lab Results  Component Value Date   CHOL 204 (H) 01/07/2021   TRIG 132 01/07/2021   HDL 72 01/07/2021   CHOLHDL 2.8 01/07/2021   VLDL 31 01/07/2013   LDLCALC 109 (H) 01/07/2021   LDLCALC 80 10/08/2018   Lab Results  Component Value Date   TSH 4.080 01/07/2021   TSH 1.971 10/24/2019    Therapeutic Level Labs: Lab Results  Component Value Date   LITHIUM 0.45 (L) 01/27/2020   LITHIUM 0.24 (L) 09/03/2019   No results found for: "VALPROATE" No results found for: "CBMZ"  Current Medications: Current Outpatient Medications  Medication Sig Dispense Refill   mirtazapine (REMERON) 15 MG tablet Take 1 tablet (15 mg total) by mouth at bedtime. 30 tablet 2   ALPRAZolam (XANAX) 1 MG tablet Take 1 tablet (1 mg total) by mouth 4 (four) times daily. 120 tablet 2   amphetamine-dextroamphetamine (ADDERALL) 20 MG tablet Take 1 tablet (20 mg total) by mouth 3 (three) times daily. 90 tablet 0    amphetamine-dextroamphetamine (ADDERALL) 20 MG tablet Take 1 tablet (20 mg total) by mouth 3 (three) times daily. Dx code F90.0 ADD 90 tablet 0   ARIPiprazole (ABILIFY) 2 MG tablet Take 1 tablet (2 mg total) by mouth daily. 30 tablet 2   carbamazepine (TEGRETOL) 200 MG tablet Take 1 tablet (200 mg total) by mouth 2 (two) times daily. 60 tablet 2   citalopram (CELEXA) 20 MG tablet Take 1 tablet (20 mg total) by mouth daily. 30 tablet 2   ibuprofen (ADVIL,MOTRIN) 200 MG tablet Take 400-600 mg by mouth every 6 (six) hours as needed for mild pain or moderate pain.     megestrol (MEGACE) 40 MG tablet 3 tablets a day for 5 days, 2 tablets a day for 5 days then 1 tablet daily 45 tablet 3   norethindrone (MICRONOR) 0.35 MG tablet TAKE (1) TABLET BY MOUTH ONCE DAILY. (Patient not taking: Reported on 07/03/2022) 28 tablet 12   No current facility-administered medications for this visit.     Musculoskeletal: Strength & Muscle Tone: within normal limits Gait & Station: normal Patient leans: N/A  Psychiatric Specialty Exam: Review of Systems  Constitutional:  Positive for appetite change.  Gastrointestinal:  Positive for abdominal pain.  Psychiatric/Behavioral:  Positive for dysphoric mood and sleep disturbance.   All other systems reviewed and are negative.   There were no vitals taken for this visit.There is no height or weight on file to calculate BMI.  General Appearance: Casual and Fairly Groomed  Eye Contact:  Good  Speech:  Clear and Coherent  Volume:  Normal  Mood:  Depressed and Irritable  Affect:  Depressed  Thought Process:  Goal Directed  Orientation:  Full (Time, Place, and Person)  Thought Content:  Rumination   Suicidal Thoughts:  No  Homicidal Thoughts:  No  Memory:  Immediate;   Good Recent;   Good Remote;   Good  Judgement:  Fair  Insight:  Fair  Psychomotor Activity:  Decreased  Concentration:  Concentration: Good and Attention Span: Good  Recall:  Good  Fund of  Knowledge: Good  Language: Good  Akathisia:  No  Handed:  Right  AIMS (if indicated): not done  Assets:  Communication Skills Desire for Improvement Resilience Social Support  ADL's:  Intact  Cognition: WNL  Sleep:  Poor   Screenings: GAD-7    Flowsheet Row Office Visit from 07/03/2022 in [place] from 05/31/2021 in [place] Office Visit from 01/07/2021 in [place]  Total GAD-7 Score '21 20 16      '$ Mini-Mental    [place] Visit from 10/25/2016 in [facility]  Total Score (max 30 points ) 29      PHQ2-9    [place] Visit from 07/03/2022 in [place] Video Visit from 06/29/2022 in [place] [facility] Video Visit from 04/11/2022 in [place] Video Visit from 03/03/2022 in [place] [facility] Video Visit from 02/03/2022 in [facility] [facility]  PHQ-2 Total Score '6 1 2 5 6  '$ PHQ-9 Total Score 21 -- '6 13 12      '$ Flowsheet Row Video Visit from 06/29/2022 in [place] [facility] [place] Video Visit from 04/11/2022 in [facility] Video Visit from 03/03/2022 in [place] No Risk Error: Q3, 4, or 5 should not be populated when Q2 is No Error: Q3, 4, or 5 should not be populated when Q2 is No        Assessment and Plan: This patient is a 41 year old female with a history of depression dysthymia chronic fatigue social anxiety ADD and borderline personality traits.  She is not sleeping well and seems more angry and irritable and externalizing blame to everyone around her.  For now we will add mirtazapine 15 mg at bedtime to help with sleep appetite and anxiety.  She will cut down Celexa to  20 mg once a day for depression.  She will continue Tegretol 200 mg daily for mood stabilization, Abilify 2 mg daily for augmentation and Xanax 1 mg 4 times daily for anxiety and Adderall 20 mg 3 times daily for ADD.  She will return to see me in 4 weeks  Collaboration of Care: Collaboration of Care: Referral or follow-up with counselor/therapist AEB patient will continue therapy with Maye Hides in our office  Patient/Guardian was advised Release of Information must be obtained prior to any record release in order to collaborate their care with an outside provider. Patient/Guardian was advised if they have not already done so to contact the registration department to sign all necessary forms in order for [place] to release information regarding their care.   Consent: Patient/Guardian gives verbal consent for treatment and assignment of benefits for services provided during this visit. Patient/Guardian expressed understanding and agreed to proceed.    [name], MD 09/14/2022, 12:03 PM

## 2022-10-12 ENCOUNTER — Telehealth (INDEPENDENT_AMBULATORY_CARE_PROVIDER_SITE_OTHER): Payer: Medicare Other | Admitting: Psychiatry

## 2022-10-12 ENCOUNTER — Encounter (HOSPITAL_COMMUNITY): Payer: Self-pay | Admitting: Psychiatry

## 2022-10-12 ENCOUNTER — Telehealth (HOSPITAL_COMMUNITY): Payer: Self-pay | Admitting: *Deleted

## 2022-10-12 DIAGNOSIS — F411 Generalized anxiety disorder: Secondary | ICD-10-CM | POA: Diagnosis not present

## 2022-10-12 DIAGNOSIS — F332 Major depressive disorder, recurrent severe without psychotic features: Secondary | ICD-10-CM

## 2022-10-12 DIAGNOSIS — F901 Attention-deficit hyperactivity disorder, predominantly hyperactive type: Secondary | ICD-10-CM

## 2022-10-12 MED ORDER — AMPHETAMINE-DEXTROAMPHETAMINE 20 MG PO TABS: 20.0000 mg | ORAL_TABLET | Freq: Three times a day (TID) | ORAL | 0 refills | Status: DC

## 2022-10-12 MED ORDER — ALPRAZOLAM 1 MG PO TABS: 1.0000 mg | ORAL_TABLET | Freq: Four times a day (QID) | ORAL | 2 refills | Status: DC

## 2022-10-12 MED ORDER — ARIPIPRAZOLE 2 MG PO TABS: 2.0000 mg | ORAL_TABLET | Freq: Every day | ORAL | 2 refills | Status: DC

## 2022-10-12 MED ORDER — CARBAMAZEPINE 200 MG PO TABS: 200.0000 mg | ORAL_TABLET | Freq: Two times a day (BID) | ORAL | 2 refills | Status: DC

## 2022-10-12 MED ORDER — CITALOPRAM HYDROBROMIDE 20 MG PO TABS: 20.0000 mg | ORAL_TABLET | Freq: Every day | ORAL | 2 refills | Status: DC

## 2022-10-12 NOTE — Telephone Encounter (Signed)
Per pt she ready her AVS in her mychart and she stated she did not say she don't want to increase it. Per pt she didn;t know if it would work with her body and due to the shortage. Per pt she would like to increase it. Per patient things have just been off due to focusing. Per pt she didn't want to increase it and the pharmacy didn't have it in stock and no one else have it in stock.  [name] and the medication is Adderall.

## 2022-10-12 NOTE — Telephone Encounter (Signed)
Patient stated she will call back when she makes up her mind.

## 2022-10-12 NOTE — Progress Notes (Signed)
Virtual Visit via Video Note  I connected with Destiny Orr on 10/12/22 at  1:40 PM EST by a video enabled telemedicine application and verified that I am speaking with the correct person using two identifiers.  Location: Patient: home Provider: office   I discussed the limitations of evaluation and management by telemedicine and the availability of in person appointments. The patient expressed understanding and agreed to proceed.    I discussed the assessment and treatment plan with the patient. The patient was provided an opportunity to ask questions and all were answered. The patient agreed with the plan and demonstrated an understanding of the instructions.   The patient was advised to call back or seek an in-person evaluation if the symptoms worsen or if the condition fails to improve as anticipated.  I provided 20 minutes of non-face-to-face time during this encounter.   [name], MD  [facility] MD/PA/NP OP Progress Note  10/12/2022 2:03 PM Destiny Orr  MRN:  [id number]  Chief Complaint:  Chief Complaint  Patient presents with   Anxiety   Depression   ADD   Follow-up   HPI: This patient is a 42 year old separated white female who lives with her ex husband 1 son and 1 daughter in [place].  She was on disability from [employer] but now get Social Security disability     The patient returns for follow-up after about 4 weeks.  She states that she is very tired.  For several weeks she was going to her daughter's house to help with the baby because the baby was not sleeping at night.  She finally stopped doing this and now she is sleeping "too much."  Nevertheless she feels exhausted.  She keeps thinking there is something seriously wrong with her health.  However she does not have a primary doctor and states insurance is trying to find her 1.  She claims that the Adderall she got at her pharmacy is not working as well and she "loses my thoughts midsentence."  However she does not  want to increase it.  She is not really open to suggestion about anything that I sent today.  She never took the mirtazapine that I sent in claiming that she does not need it for sleep as she is now sleeping too much.  She denies any thoughts of self-harm or suicide but is very critical and upset about her life but does not seem amenable to making any changes. Visit Diagnosis:    ICD-10-CM   1. Major depressive disorder, recurrent, severe without psychotic features ([place])  F33.2     2. Attention deficit hyperactivity disorder (ADHD), predominantly hyperactive type  F90.1     3. GAD (generalized anxiety disorder)  F41.1       Past Psychiatric History: Long-term outpatient treatment for depression and anxiety  Past Medical History:  Past Medical History:  Diagnosis Date   Abnormal Papanicolaou smear of cervix with positive human papilloma virus (HPV) test 10/11/2018   LSIL +HPV, get colpo___   Abnormal uterine bleeding (AUB) 12/09/2014   Anxiety    Arthritis    Depression    DJD (degenerative joint disease)    Dumping syndrome    Dysmenorrhea 07/15/2014   Fatigue 12/24/2012   Fibromyalgia diagnosed April 2016   Headache    Hx of migraine headaches 12/24/2012   Inappropriate sinus tachycardia    Irregular menstrual bleeding 07/15/2014   Pelvic pain in female 03/03/2016   Pneumothorax, spontaneous, tension    Post concussion  syndrome    POTS (postural orthostatic tachycardia syndrome)    PTSD (post-traumatic stress disorder)    Scoliosis    Thyroid disease    Tick bite 03/03/2016   Vitamin B 12 deficiency     Past Surgical History:  Procedure Laterality Date   bone spurs toes Right    CESAREAN SECTION     COLONOSCOPY     endooscopy     KNEE SURGERY     rotate cuff lt arm      Family Psychiatric History: See below  Family History:  Family History  Problem Relation Age of Onset   Hypertension Father    Atrial fibrillation Father    Alcohol abuse Father    Cancer Maternal  Grandmother        skin    Heart disease Maternal Grandmother    Other Maternal Grandmother        had thyroid removed   Breast cancer Maternal Grandmother    Heart disease Paternal Grandfather    COPD Paternal Grandfather    Hypertension Paternal Grandfather    Diabetes Paternal Grandfather    Stroke Paternal Grandfather    Cancer Maternal Grandfather        bladder,lung   Anxiety disorder Maternal Aunt    Anxiety disorder Maternal Uncle    Bipolar disorder Maternal Uncle    Breast cancer Maternal Uncle        CML   Leukemia Other    Colon cancer Other        lung-2 maternal great uncles    Social History:  Social History   Socioeconomic History   Marital status: Legally Separated    Spouse name: Not on file   Number of children: 3   Years of education: Not on file   Highest education level: Not on file  Occupational History   Not on file  Tobacco Use   Smoking status: Former    Packs/day: 1.00    Years: 15.00    Total pack years: 15.00    Types: Cigarettes    Start date: 03/13/1997    Quit date: 09/25/2021    Years since quitting: 1.0   Smokeless tobacco: Never   Tobacco comments:    "working on it" Was smoking 2.5 packs a day  Vaping Use   Vaping Use: Never used  Substance and Sexual Activity   Alcohol use: No   Drug use: No   Sexual activity: Yes    Partners: Male    Birth control/protection: Pill  Other Topics Concern   Not on file  Social History Narrative   Not on file   Social Determinants of Health   Financial Resource Strain: High Risk (07/03/2022)   Overall Financial Resource Strain (CARDIA)    Difficulty of Paying Living Expenses: Very hard  Food Insecurity: Food Insecurity Present (07/03/2022)   Hunger Vital Sign    Worried About Running Out of Food in the Last Year: Sometimes true    Ran Out of Food in the Last Year: Never true  Transportation Needs: No Transportation Needs (07/03/2022)   PRAPARE - Radiographer, therapeutic (Medical): No    Lack of Transportation (Non-Medical): No  Physical Activity: Inactive (01/07/2021)   Exercise Vital Sign    Days of Exercise per Week: 0 days    Minutes of Exercise per Session: 0 min  Stress: Stress Concern Present (07/03/2022)   [place]  Feeling of Stress : Very much  Social Connections: Moderately Integrated (07/03/2022)   Social Connection and Isolation Panel [NHANES]    Frequency of Communication with Friends and Family: More than three times a week    Frequency of Social Gatherings with Friends and Family: Once a week    Attends Religious Services: 1 to 4 times per year    Active Member of [employer] or Organizations: No    Attends Archivist Meetings: Never    Marital Status: Married    Allergies:  Allergies  Allergen Reactions   Prozac [Fluoxetine]     Suicidal thoughts   Topiramate Other (See Comments)    Topamax-dizziness   Lexapro [Escitalopram Oxalate] Other (See Comments)    Flat affect, No emotions    Metabolic Disorder Labs: Lab Results  Component Value Date   HGBA1C 5.4 01/07/2013   MPG 108 01/07/2013   Lab Results  Component Value Date   PROLACTIN 13.2 06/12/2016   Lab Results  Component Value Date   CHOL 204 (H) 01/07/2021   TRIG 132 01/07/2021   HDL 72 01/07/2021   CHOLHDL 2.8 01/07/2021   VLDL 31 01/07/2013   LDLCALC 109 (H) 01/07/2021   LDLCALC 80 10/08/2018   Lab Results  Component Value Date   TSH 4.080 01/07/2021   TSH 1.971 10/24/2019    Therapeutic Level Labs: Lab Results  Component Value Date   LITHIUM 0.45 (L) 01/27/2020   LITHIUM 0.24 (L) 09/03/2019   No results found for: "VALPROATE" No results found for: "CBMZ"  Current Medications: Current Outpatient Medications  Medication Sig Dispense Refill   ALPRAZolam (XANAX) 1 MG tablet Take 1 tablet (1 mg total) by mouth 4 (four) times daily. 120 tablet 2    amphetamine-dextroamphetamine (ADDERALL) 20 MG tablet Take 1 tablet (20 mg total) by mouth 3 (three) times daily. 90 tablet 0   amphetamine-dextroamphetamine (ADDERALL) 20 MG tablet Take 1 tablet (20 mg total) by mouth 3 (three) times daily. Dx code F90.0 ADD 90 tablet 0   ARIPiprazole (ABILIFY) 2 MG tablet Take 1 tablet (2 mg total) by mouth daily. 30 tablet 2   carbamazepine (TEGRETOL) 200 MG tablet Take 1 tablet (200 mg total) by mouth 2 (two) times daily. 60 tablet 2   citalopram (CELEXA) 20 MG tablet Take 1 tablet (20 mg total) by mouth daily. 30 tablet 2   ibuprofen (ADVIL,MOTRIN) 200 MG tablet Take 400-600 mg by mouth every 6 (six) hours as needed for mild pain or moderate pain.     megestrol (MEGACE) 40 MG tablet 3 tablets a day for 5 days, 2 tablets a day for 5 days then 1 tablet daily 45 tablet 3   norethindrone (MICRONOR) 0.35 MG tablet TAKE (1) TABLET BY MOUTH ONCE DAILY. (Patient not taking: Reported on 07/03/2022) 28 tablet 12   No current facility-administered medications for this visit.     Musculoskeletal: Strength & Muscle Tone: within normal limits Gait & Station: normal Patient leans: N/A  Psychiatric Specialty Exam: Review of Systems  Constitutional:  Positive for fatigue.  Psychiatric/Behavioral:  Positive for dysphoric mood.   All other systems reviewed and are negative.   There were no vitals taken for this visit.There is no height or weight on file to calculate BMI.  General Appearance: Casual and Fairly Groomed  Eye Contact:  Good  Speech:  Clear and Coherent  Volume:  Normal  Mood:  Dysphoric and Irritable  Affect:  Flat  Thought Process:  Goal Directed  Orientation:  Full (Time, Place, and Person)  Thought Content: Rumination   Suicidal Thoughts:  No  Homicidal Thoughts:  No  Memory:  Immediate;   Good Recent;   Good Remote;   Good  Judgement:  Fair  Insight:  Lacking  Psychomotor Activity:  Decreased  Concentration:  Concentration: Fair and  Attention Span: Fair  Recall:  Good  Fund of Knowledge: Good  Language: Good  Akathisia:  No  Handed:  Right  AIMS (if indicated): not done  Assets:  Communication Skills Desire for Improvement Resilience Social Support Talents/Skills  ADL's:  Intact  Cognition: WNL  Sleep:  Good   Screenings: GAD-7    Flowsheet Row Office Visit from 07/03/2022 in [place] from 05/31/2021 in [place] Office Visit from 01/07/2021 in [place]  Total GAD-7 Score '21 20 16      '$ [place] Office Visit from 10/25/2016 in [facility] [place]  Total Score (max 30 points ) 29      PHQ2-9    [place] Visit from 07/03/2022 in [place] Video Visit from 06/29/2022 in [place] Video Visit from 04/11/2022 in [place] Video Visit from 03/03/2022 in [facility] Video Visit from 02/03/2022 in [facility]  PHQ-2 Total Score '6 1 2 5 6  '$ PHQ-9 Total Score 21 -- '6 13 12      '$ Flowsheet Row Video Visit from 06/29/2022 in [facility] Video Visit from 04/11/2022 in [place] Video Visit from 03/03/2022 in [place] No Risk Error: Q3, 4, or 5 should not be populated when Q2 is No Error: Q3, 4, or 5 should not be populated when Q2 is No        Assessment and Plan: This patient is a 42 year old female with a history of depression dysthymia chronic fatigue social anxiety ADD and borderline personality traits.  Over her last few visits the borderline personality seems to be taking precedence as she is often irritable and angry but not amenable to any suggestion for change.   I do not see where medication changes are going to do much of anything.  For now she will continue Celexa 20 mg daily for depression, Tegretol 200 mg daily for mood stabilization, Abilify 2 mg daily for augmentation Xanax 1 mg 4 times daily for anxiety and Adderall 20 mg 3 times daily for ADD.  She will return to see me  Collaboration of Care: Collaboration of Care: Referral or follow-up with counselor/therapist AEB patient will continue therapy with Maye Hides in our office  Patient/Guardian was advised Release of Information must be obtained prior to any record release in order to collaborate their care with an outside provider. Patient/Guardian was advised if they have not already done so to contact the registration department to sign all necessary forms in order for [place] to release information regarding their care.   Consent: Patient/Guardian gives verbal consent for treatment and assignment of benefits for services provided during this visit. Patient/Guardian expressed understanding and agreed to proceed.    [name], MD 10/12/2022, 2:03 PM

## 2022-10-12 NOTE — Telephone Encounter (Signed)
Actually, tell her my mistake 60 mg daily is already maximum dose. We could try a different stimulant if she would like

## 2022-11-06 ENCOUNTER — Telehealth: Payer: Medicare Other | Admitting: Physician Assistant

## 2022-11-06 ENCOUNTER — Other Ambulatory Visit (HOSPITAL_COMMUNITY): Payer: Self-pay | Admitting: Psychiatry

## 2022-11-06 DIAGNOSIS — J019 Acute sinusitis, unspecified: Secondary | ICD-10-CM | POA: Diagnosis not present

## 2022-11-06 DIAGNOSIS — B9789 Other viral agents as the cause of diseases classified elsewhere: Secondary | ICD-10-CM

## 2022-11-06 MED ORDER — IPRATROPIUM BROMIDE 0.03 % NA SOLN: 2.0000 | Freq: Three times a day (TID) | NASAL | Status: DC

## 2022-11-06 MED ORDER — AMPHETAMINE-DEXTROAMPHETAMINE 20 MG PO TABS: 20.0000 mg | ORAL_TABLET | Freq: Three times a day (TID) | ORAL | 0 refills | Status: DC

## 2022-11-06 NOTE — Telephone Encounter (Signed)
Patient called back and Per pt she will just keep the regular Adderall she's on. Per pt she is needing refills.

## 2022-11-06 NOTE — Patient Instructions (Signed)
  Nadara Mustard, thank you for joining [place], PA-C for today's virtual visit.  While this provider is not your primary care provider (PCP), if your PCP is located in our provider database this encounter information will be shared with them immediately following your visit.   [facility] account gives you access to today's visit and all your visits, tests, and labs performed at [facility] " click here if you don't have a [facility] account or go to [email or web address]  Consent: (Patient) Destiny Orr provided verbal consent for this virtual visit at the beginning of the encounter.  Current Medications:  Current Outpatient Medications:    ALPRAZolam (XANAX) 1 MG tablet, Take 1 tablet (1 mg total) by mouth 4 (four) times daily., Disp: 120 tablet, Rfl: 2   amphetamine-dextroamphetamine (ADDERALL) 20 MG tablet, Take 1 tablet (20 mg total) by mouth 3 (three) times daily. Dx code F90.0 ADD, Disp: 90 tablet, Rfl: 0   amphetamine-dextroamphetamine (ADDERALL) 20 MG tablet, Take 1 tablet (20 mg total) by mouth 3 (three) times daily., Disp: 90 tablet, Rfl: 0   ARIPiprazole (ABILIFY) 2 MG tablet, Take 1 tablet (2 mg total) by mouth daily., Disp: 30 tablet, Rfl: 2   carbamazepine (TEGRETOL) 200 MG tablet, Take 1 tablet (200 mg total) by mouth 2 (two) times daily., Disp: 60 tablet, Rfl: 2   citalopram (CELEXA) 20 MG tablet, Take 1 tablet (20 mg total) by mouth daily., Disp: 30 tablet, Rfl: 2   ibuprofen (ADVIL,MOTRIN) 200 MG tablet, Take 400-600 mg by mouth every 6 (six) hours as needed for mild pain or moderate pain., Disp: , Rfl:    megestrol (MEGACE) 40 MG tablet, 3 tablets a day for 5 days, 2 tablets a day for 5 days then 1 tablet daily, Disp: 45 tablet, Rfl: 3   norethindrone (MICRONOR) 0.35 MG tablet, TAKE (1) TABLET BY MOUTH ONCE DAILY. (Patient not taking: Reported on 07/03/2022), Disp: 28 tablet, Rfl: 12  Current Facility-Administered Medications:    ipratropium  (ATROVENT) 0.03 % nasal spray 2 spray, 2 spray, Each Nare, TID, Ward, Lenise Arena, PA-C   Medications ordered in this encounter:  Meds ordered this encounter  Medications   ipratropium (ATROVENT) 0.03 % nasal spray 2 spray     *If you need refills on other medications prior to your next appointment, please contact your pharmacy*  Follow-Up: Call back or seek an in-person evaluation if the symptoms worsen or if the condition fails to improve as anticipated.  [place] [phone]  Other Instructions  Can continue with daily Zyrtec. Use nasal spray as prescribed. Drink plenty of fluids. Recommend Ibuprofen as needed. If no improvement or symptoms become worse follow up in Urgent Care for in person evaluation.   If you have been instructed to have an in-person evaluation today at a local Urgent Care facility, please use the link below. It will take you to a list of all of our available [facility], including address, phone number and hours of operation. Please do not delay care.  [facility]  If you or a family member do not have a primary care provider, use the link below to schedule a visit and establish care. When you choose a [facility] primary care physician or advanced practice provider, you gain a long-term partner in health. Find a Primary Care Provider  Learn more about 's in-office and virtual care options: [facility]

## 2022-11-06 NOTE — Progress Notes (Signed)
Virtual Visit Consent   Destiny Orr, you are scheduled for a virtual visit with a [facility] provider today. Just as with appointments in the office, your consent must be obtained to participate. Your consent will be active for this visit and any virtual visit you may have with one of our providers in the next 365 days. If you have a MyChart account, a copy of this consent can be sent to you electronically.  As this is a virtual visit, video technology does not allow for your provider to perform a traditional examination. This may limit your provider's ability to fully assess your condition. If your provider identifies any concerns that need to be evaluated in person or the need to arrange testing (such as labs, EKG, etc.), we will make arrangements to do so. Although advances in technology are sophisticated, we cannot ensure that it will always work on either your end or our end. If the connection with a video visit is poor, the visit may have to be switched to a telephone visit. With either a video or telephone visit, we are not always able to ensure that we have a secure connection.  By engaging in this virtual visit, you consent to the provision of healthcare and authorize for your insurance to be billed (if applicable) for the services provided during this visit. Depending on your insurance coverage, you may receive a charge related to this service.  I need to obtain your verbal consent now. Are you willing to proceed with your visit today? Monigue Osmundson has provided verbal consent on 11/06/2022 for a virtual visit (video or telephone). Lenise Arena Ward, PA-C  Date: 11/06/2022 6:18 PM  Virtual Visit via Video Note   I, Lenise Arena Ward, connected with  Kamee Bobst  ([id number], Jul 13, 1981) on 11/06/22 at  6:15 PM EST by a video-enabled telemedicine application and verified that I am speaking with the correct person using two identifiers.  Location: Patient: Virtual Visit Location Patient: Home Provider:  Virtual Visit Location Provider: Home   I discussed the limitations of evaluation and management by telemedicine and the availability of in person appointments. The patient expressed understanding and agreed to proceed.    History of Present Illness: Destiny Orr is a 42 y.o. who identifies as a female who was assigned female at birth, and is being seen today for pt complains of congestion, sinus pressure, ear pressure that started about 4 days ago.  She reports on fever. Denies coughing.  She is currently taking Zyrtec and Ibuprofen with minimal relief.    HPI: HPI  Problems:  Patient Active Problem List   Diagnosis Date Noted   Encounter for surveillance of contraceptive pills 01/07/2021   History of abnormal cervical Pap smear 01/07/2021   Screening cholesterol level 01/07/2021   Vitamin D deficiency 01/20/2020   Hypothyroidism 11/15/2018   Abnormal Papanicolaou smear of cervix with positive human papilloma virus (HPV) test 10/11/2018   Goiter 10/08/2018   Breast tenderness 10/08/2018   Mass of upper outer quadrant of right breast 10/08/2018   Encounter for gynecological examination with Papanicolaou smear of cervix 10/08/2018   Current smoker 07/03/2017   Leukocytosis 06/18/2017   Thyroid nodule 12/26/2016   Neck injury, initial encounter 10/20/2016   Concussion with loss of consciousness 10/20/2016   Injury of left shoulder 10/20/2016   Sinus tachycardia 10/16/2016   Post concussion syndrome 10/03/2016   Intractable headache 10/03/2016   Dizziness 10/03/2016   Blurry vision, right eye 10/03/2016   Nodular goiter  06/18/2016   Fibromyalgia syndrome 06/16/2016   Raynaud's syndrome without gangrene 06/16/2016   Numbness and tingling sensation of skin 06/16/2016   B12 deficiency 06/16/2016   Dizziness and giddiness 06/16/2016   Pelvic pain in female 03/03/2016   Tick bite 03/03/2016   POTS (postural orthostatic tachycardia syndrome) 02/05/2016   Abnormal uterine bleeding (AUB)  12/09/2014   Depression 11/26/2014   Dysmenorrhea 07/15/2014   Irregular menstrual bleeding 07/15/2014   Back pain, chronic 04/02/2013   Chronic fatigue and malaise 12/24/2012   Anxiety 12/24/2012   PTSD (post-traumatic stress disorder) 12/24/2012   ADD (attention deficit disorder) 12/24/2012   Contraception 12/24/2012   Hx of migraine headaches 12/24/2012    Allergies:  Allergies  Allergen Reactions   Prozac [Fluoxetine]     Suicidal thoughts   Topiramate Other (See Comments)    Topamax-dizziness   Lexapro [Escitalopram Oxalate] Other (See Comments)    Flat affect, No emotions   Medications:  Current Outpatient Medications:    ALPRAZolam (XANAX) 1 MG tablet, Take 1 tablet (1 mg total) by mouth 4 (four) times daily., Disp: 120 tablet, Rfl: 2   amphetamine-dextroamphetamine (ADDERALL) 20 MG tablet, Take 1 tablet (20 mg total) by mouth 3 (three) times daily. Dx code F90.0 ADD, Disp: 90 tablet, Rfl: 0   amphetamine-dextroamphetamine (ADDERALL) 20 MG tablet, Take 1 tablet (20 mg total) by mouth 3 (three) times daily., Disp: 90 tablet, Rfl: 0   ARIPiprazole (ABILIFY) 2 MG tablet, Take 1 tablet (2 mg total) by mouth daily., Disp: 30 tablet, Rfl: 2   carbamazepine (TEGRETOL) 200 MG tablet, Take 1 tablet (200 mg total) by mouth 2 (two) times daily., Disp: 60 tablet, Rfl: 2   citalopram (CELEXA) 20 MG tablet, Take 1 tablet (20 mg total) by mouth daily., Disp: 30 tablet, Rfl: 2   ibuprofen (ADVIL,MOTRIN) 200 MG tablet, Take 400-600 mg by mouth every 6 (six) hours as needed for mild pain or moderate pain., Disp: , Rfl:    megestrol (MEGACE) 40 MG tablet, 3 tablets a day for 5 days, 2 tablets a day for 5 days then 1 tablet daily, Disp: 45 tablet, Rfl: 3   norethindrone (MICRONOR) 0.35 MG tablet, TAKE (1) TABLET BY MOUTH ONCE DAILY. (Patient not taking: Reported on 07/03/2022), Disp: 28 tablet, Rfl: 12  Current Facility-Administered Medications:    ipratropium (ATROVENT) 0.03 % nasal spray 2  spray, 2 spray, Each Nare, TID, Ward, Lenise Arena, PA-C  Observations/Objective: Patient is well-developed, well-nourished in no acute distress.  Resting comfortably at home.  Head is normocephalic, atraumatic.  No labored breathing.  Speech is clear and coherent with logical content.  Patient is alert and oriented at baseline.    Assessment and Plan: 1. Acute viral sinusitis - ipratropium (ATROVENT) 0.03 % nasal spray 2 spray  Supportive care discussed.  In person evaluation precautions given.   Follow Up Instructions: I discussed the assessment and treatment plan with the patient. The patient was provided an opportunity to ask questions and all were answered. The patient agreed with the plan and demonstrated an understanding of the instructions.  A copy of instructions were sent to the patient via MyChart unless otherwise noted below.     The patient was advised to call back or seek an in-person evaluation if the symptoms worsen or if the condition fails to improve as anticipated.  Time:  I spent 21 minutes with the patient via telehealth technology discussing the above problems/concerns.    Lenise Arena Ward, PA-C

## 2022-11-27 ENCOUNTER — Telehealth (INDEPENDENT_AMBULATORY_CARE_PROVIDER_SITE_OTHER): Payer: Medicare Other | Admitting: Psychiatry

## 2022-11-27 ENCOUNTER — Encounter (HOSPITAL_COMMUNITY): Payer: Self-pay | Admitting: Psychiatry

## 2022-11-27 DIAGNOSIS — F332 Major depressive disorder, recurrent severe without psychotic features: Secondary | ICD-10-CM | POA: Diagnosis not present

## 2022-11-27 DIAGNOSIS — F901 Attention-deficit hyperactivity disorder, predominantly hyperactive type: Secondary | ICD-10-CM

## 2022-11-27 DIAGNOSIS — F411 Generalized anxiety disorder: Secondary | ICD-10-CM

## 2022-11-27 MED ORDER — CITALOPRAM HYDROBROMIDE 20 MG PO TABS: 20.0000 mg | ORAL_TABLET | Freq: Two times a day (BID) | ORAL | 2 refills | Status: DC

## 2022-11-27 MED ORDER — AMPHETAMINE-DEXTROAMPHETAMINE 20 MG PO TABS: 20.0000 mg | ORAL_TABLET | Freq: Three times a day (TID) | ORAL | 0 refills | Status: DC

## 2022-11-27 MED ORDER — ARIPIPRAZOLE 2 MG PO TABS: 2.0000 mg | ORAL_TABLET | Freq: Every day | ORAL | 2 refills | Status: DC

## 2022-11-27 MED ORDER — ALPRAZOLAM 1 MG PO TABS: 1.0000 mg | ORAL_TABLET | Freq: Four times a day (QID) | ORAL | 2 refills | Status: DC

## 2022-11-27 MED ORDER — CARBAMAZEPINE 200 MG PO TABS: 200.0000 mg | ORAL_TABLET | Freq: Two times a day (BID) | ORAL | 2 refills | Status: DC

## 2022-11-27 NOTE — Progress Notes (Signed)
Virtual Visit via Video Note  I connected with Destiny Orr on 11/28/22 at 11:00 AM EST by a video enabled telemedicine application and verified that I am speaking with the correct person using two identifiers.  Location: Patient: home Provider: office   I discussed the limitations of evaluation and management by telemedicine and the availability of in person appointments. The patient expressed understanding and agreed to proceed.     I discussed the assessment and treatment plan with the patient. The patient was provided an opportunity to ask questions and all were answered. The patient agreed with the plan and demonstrated an understanding of the instructions.   The patient was advised to call back or seek an in-person evaluation if the symptoms worsen or if the condition fails to improve as anticipated.  I provided 15 minutes of non-face-to-face time during this encounter.   [name], MD  [facility] MD/PA/NP OP Progress Note  11/27/2022 11:23 AM Destiny Orr  MRN:  [id number]  Chief Complaint:  Chief Complaint  Patient presents with   Anxiety   Depression   Follow-up   HPI: This patient is a 42 year old separated white female who lives with her ex husband 1 son and 1 daughter in [place].  She was on disability from [employer] but now get Social Security disability    The patient returns for follow-up after about 6 weeks.  She states that she is "exhausted again."  Her older daughter and the daughter's boyfriend are thinking of separating but for now they are going to move back in to her household because they cannot afford the rent.  Obviously they are bringing the baby with them.  This is just going to cause more chaos according to the patient although she loves being with the baby.  Often the baby cannot sleep unless the patient has someone to put her to bed.  The patient complains of increased anxiety.  I offered to change the Xanax to something else but she is afraid to at  this point.  We do have room to go up on the Celexa which helps with depression and anxiety.  When I asked her about self-care such as doing some relaxing things she claims "I just do not have time."  Most of the time she is sleeping well.  Her mood is somewhat up and down but she denies any thoughts of self-harm or suicide.  As usual she is unsatisfied about her life but not willing to make many changes Visit Diagnosis:    ICD-10-CM   1. Major depressive disorder, recurrent, severe without psychotic features ([place])  F33.2     2. Attention deficit hyperactivity disorder (ADHD), predominantly hyperactive type  F90.1     3. GAD (generalized anxiety disorder)  F41.1       Past Psychiatric History: Long-term outpatient treatment for depression and anxiety  Past Medical History:  Past Medical History:  Diagnosis Date   Abnormal Papanicolaou smear of cervix with positive human papilloma virus (HPV) test 10/11/2018   LSIL +HPV, get colpo___   Abnormal uterine bleeding (AUB) 12/09/2014   Anxiety    Arthritis    Depression    DJD (degenerative joint disease)    Dumping syndrome    Dysmenorrhea 07/15/2014   Fatigue 12/24/2012   Fibromyalgia diagnosed April 2016   Headache    Hx of migraine headaches 12/24/2012   Inappropriate sinus tachycardia    Irregular menstrual bleeding 07/15/2014   Pelvic pain in female 03/03/2016   Pneumothorax,  spontaneous, tension    Post concussion syndrome    POTS (postural orthostatic tachycardia syndrome)    PTSD (post-traumatic stress disorder)    Scoliosis    Thyroid disease    Tick bite 03/03/2016   Vitamin B 12 deficiency     Past Surgical History:  Procedure Laterality Date   bone spurs toes Right    CESAREAN SECTION     COLONOSCOPY     endooscopy     KNEE SURGERY     rotate cuff lt arm      Family Psychiatric History: See below  Family History:  Family History  Problem Relation Age of Onset   Hypertension Father    Atrial fibrillation Father     Alcohol abuse Father    Cancer Maternal Grandmother        skin    Heart disease Maternal Grandmother    Other Maternal Grandmother        had thyroid removed   Breast cancer Maternal Grandmother    Heart disease Paternal Grandfather    COPD Paternal Grandfather    Hypertension Paternal Grandfather    Diabetes Paternal Grandfather    Stroke Paternal Grandfather    Cancer Maternal Grandfather        bladder,lung   Anxiety disorder Maternal Aunt    Anxiety disorder Maternal Uncle    Bipolar disorder Maternal Uncle    Breast cancer Maternal Uncle        CML   Leukemia Other    Colon cancer Other        lung-2 maternal great uncles    Social History:  Social History   Socioeconomic History   Marital status: Legally Separated    Spouse name: Not on file   Number of children: 3   Years of education: Not on file   Highest education level: Not on file  Occupational History   Not on file  Tobacco Use   Smoking status: Former    Packs/day: 1.00    Years: 15.00    Total pack years: 15.00    Types: Cigarettes    Start date: 03/13/1997    Quit date: 09/25/2021    Years since quitting: 1.1   Smokeless tobacco: Never   Tobacco comments:    "working on it" Was smoking 2.5 packs a day  Vaping Use   Vaping Use: Never used  Substance and Sexual Activity   Alcohol use: No   Drug use: No   Sexual activity: Yes    Partners: Male    Birth control/protection: Pill  Other Topics Concern   Not on file  Social History Narrative   Not on file   Social Determinants of Health   Financial Resource Strain: High Risk (07/03/2022)   Overall Financial Resource Strain (CARDIA)    Difficulty of Paying Living Expenses: Very hard  Food Insecurity: Food Insecurity Present (07/03/2022)   Hunger Vital Sign    Worried About Running Out of Food in the Last Year: Sometimes true    Ran Out of Food in the Last Year: Never true  Transportation Needs: No Transportation Needs (07/03/2022)    PRAPARE - Hydrologist (Medical): No    Lack of Transportation (Non-Medical): No  Physical Activity: Inactive (01/07/2021)   Exercise Vital Sign    Days of Exercise per Week: 0 days    Minutes of Exercise per Session: 0 min  Stress: Stress Concern Present (07/03/2022)   [place] -  Occupational Stress Questionnaire    Feeling of Stress : Very much  Social Connections: Moderately Integrated (07/03/2022)   Social Connection and Isolation Panel [NHANES]    Frequency of Communication with Friends and Family: More than three times a week    Frequency of Social Gatherings with Friends and Family: Once a week    Attends Religious Services: 1 to 4 times per year    Active Member of [employer] or Organizations: No    Attends Archivist Meetings: Never    Marital Status: Married    Allergies:  Allergies  Allergen Reactions   Prozac [Fluoxetine]     Suicidal thoughts   Topiramate Other (See Comments)    Topamax-dizziness   Lexapro [Escitalopram Oxalate] Other (See Comments)    Flat affect, No emotions    Metabolic Disorder Labs: Lab Results  Component Value Date   HGBA1C 5.4 01/07/2013   MPG 108 01/07/2013   Lab Results  Component Value Date   PROLACTIN 13.2 06/12/2016   Lab Results  Component Value Date   CHOL 204 (H) 01/07/2021   TRIG 132 01/07/2021   HDL 72 01/07/2021   CHOLHDL 2.8 01/07/2021   VLDL 31 01/07/2013   LDLCALC 109 (H) 01/07/2021   LDLCALC 80 10/08/2018   Lab Results  Component Value Date   TSH 4.080 01/07/2021   TSH 1.971 10/24/2019    Therapeutic Level Labs: Lab Results  Component Value Date   LITHIUM 0.45 (L) 01/27/2020   LITHIUM 0.24 (L) 09/03/2019   No results found for: "VALPROATE" No results found for: "CBMZ"  Current Medications: Current Outpatient Medications  Medication Sig Dispense Refill   ALPRAZolam (XANAX) 1 MG tablet Take 1 tablet (1 mg total) by mouth 4 (four) times  daily. 120 tablet 2   amphetamine-dextroamphetamine (ADDERALL) 20 MG tablet Take 1 tablet (20 mg total) by mouth 3 (three) times daily. Dx code F90.0 ADD 90 tablet 0   amphetamine-dextroamphetamine (ADDERALL) 20 MG tablet Take 1 tablet (20 mg total) by mouth 3 (three) times daily. 90 tablet 0   ARIPiprazole (ABILIFY) 2 MG tablet Take 1 tablet (2 mg total) by mouth daily. 30 tablet 2   carbamazepine (TEGRETOL) 200 MG tablet Take 1 tablet (200 mg total) by mouth 2 (two) times daily. 60 tablet 2   citalopram (CELEXA) 20 MG tablet Take 1 tablet (20 mg total) by mouth 2 (two) times daily. 60 tablet 2   ibuprofen (ADVIL,MOTRIN) 200 MG tablet Take 400-600 mg by mouth every 6 (six) hours as needed for mild pain or moderate pain.     megestrol (MEGACE) 40 MG tablet 3 tablets a day for 5 days, 2 tablets a day for 5 days then 1 tablet daily 45 tablet 3   norethindrone (MICRONOR) 0.35 MG tablet TAKE (1) TABLET BY MOUTH ONCE DAILY. (Patient not taking: Reported on 07/03/2022) 28 tablet 12   Current Facility-Administered Medications  Medication Dose Route Frequency Provider Last Rate Last Admin   ipratropium (ATROVENT) 0.03 % nasal spray 2 spray  2 spray Each Nare TID Ward, Lenise Arena, PA-C         Musculoskeletal: Strength & Muscle Tone: within normal limits Gait & Station: normal Patient leans: N/A  Psychiatric Specialty Exam: Review of Systems  Constitutional:  Positive for fatigue.  Psychiatric/Behavioral:  Positive for dysphoric mood. The patient is nervous/anxious.   All other systems reviewed and are negative.   There were no vitals taken for this visit.There is no height or weight on  file to calculate BMI.  General Appearance: Casual and Fairly Groomed  Eye Contact:  Good  Speech:  Clear and Coherent  Volume:  Normal  Mood:  Anxious  Affect:  Full Range  Thought Process:  Goal Directed  Orientation:  Full (Time, Place, and Person)  Thought Content: Rumination   Suicidal Thoughts:  No   Homicidal Thoughts:  No  Memory:  Immediate;   Good Recent;   Fair Remote;   Fair  Judgement:  Good  Insight:  Fair  Psychomotor Activity:  Decreased  Concentration:  Concentration: Good and Attention Span: Good  Recall:  Good  Fund of Knowledge: Good  Language: Good  Akathisia:  No  Handed:  Right  AIMS (if indicated): not done  Assets:  Communication Skills Desire for Improvement Resilience Social Support Talents/Skills  ADL's:  Intact  Cognition: WNL  Sleep:  Good   Screenings: GAD-7    Flowsheet Row Office Visit from 07/03/2022 in [facility] for [place] at [place] from 05/31/2021 in [place] at [place] Visit from 01/07/2021 in [facility] for [place] at [facility]  Total GAD-7 Score '21 20 16      '$ [place] Office Visit from 10/25/2016 in [facility]  Total Score (max 30 points ) 29      PHQ2-9    [place] Visit from 07/03/2022 in [facility] for [place] at [facility] Video Visit from 06/29/2022 in [place] at [place] Video Visit from 04/11/2022 in [place] at [place] Video Visit from 03/03/2022 in [place] at [place] Video Visit from 02/03/2022 in [place] at [facility] Total Score '6 1 2 5 6  '$ PHQ-9 Total Score 21 -- '6 13 12      '$ Flowsheet Row Video Visit from 06/29/2022 in [place] at [place] Video Visit from 04/11/2022 in [place] at [place] Video Visit from 03/03/2022 in [place] at [place] No Risk Error: Q3, 4, or 5 should not be populated when Q2 is No Error: Q3, 4, or 5 should not be populated when Q2 is No        Assessment and Plan: This  patient is a 42 year old female with a history of depression dysthymia chronic fatigue social anxiety ADD and borderline personality traits.  Since she is more anxious we will increase Celexa to 20 mg twice daily.  She will continue Tegretol 200 mg daily for mood stabilization, Abilify 2 mg daily for augmentation, Xanax 1 mg 4 times daily for anxiety and Adderall 20 mg 3 times daily for ADD.  She will return to see me in 6 weeks  Collaboration of Care: Collaboration of Care: Referral or follow-up with counselor/therapist AEB patient will continue therapy with Maye Hides in our office  Patient/Guardian was advised Release of Information must be obtained prior to any record release in order to collaborate their care with an outside provider. Patient/Guardian was advised if they have not already done so to contact the registration department to sign all necessary forms in order for [place] to release information regarding their care.   Consent: Patient/Guardian gives verbal consent for treatment and assignment of benefits for services provided during this visit. Patient/Guardian expressed understanding and agreed to proceed.    [name],  MD 11/27/2022, 11:23 AM

## 2022-12-11 ENCOUNTER — Telehealth: Payer: Medicare Other | Admitting: Physician Assistant

## 2022-12-11 DIAGNOSIS — M5417 Radiculopathy, lumbosacral region: Secondary | ICD-10-CM

## 2022-12-11 NOTE — Patient Instructions (Signed)
Nadara Mustard, thank you for joining Mar Daring, PA-C for today's virtual visit.  While this provider is not your primary care provider (PCP), if your PCP is located in our provider database this encounter information will be shared with them immediately following your visit.   [place] account gives you access to today's visit and all your visits, tests, and labs performed at [facility] " click here if you don't have a [facility] account or go to [email or web address]  Consent: (Patient) Destiny Orr provided verbal consent for this virtual visit at the beginning of the encounter.  Current Medications:  Current Outpatient Medications:    ALPRAZolam (XANAX) 1 MG tablet, Take 1 tablet (1 mg total) by mouth 4 (four) times daily., Disp: 120 tablet, Rfl: 2   amphetamine-dextroamphetamine (ADDERALL) 20 MG tablet, Take 1 tablet (20 mg total) by mouth 3 (three) times daily. Dx code F90.0 ADD, Disp: 90 tablet, Rfl: 0   amphetamine-dextroamphetamine (ADDERALL) 20 MG tablet, Take 1 tablet (20 mg total) by mouth 3 (three) times daily., Disp: 90 tablet, Rfl: 0   ARIPiprazole (ABILIFY) 2 MG tablet, Take 1 tablet (2 mg total) by mouth daily., Disp: 30 tablet, Rfl: 2   carbamazepine (TEGRETOL) 200 MG tablet, Take 1 tablet (200 mg total) by mouth 2 (two) times daily., Disp: 60 tablet, Rfl: 2   citalopram (CELEXA) 20 MG tablet, Take 1 tablet (20 mg total) by mouth 2 (two) times daily., Disp: 60 tablet, Rfl: 2   ibuprofen (ADVIL,MOTRIN) 200 MG tablet, Take 400-600 mg by mouth every 6 (six) hours as needed for mild pain or moderate pain., Disp: , Rfl:    megestrol (MEGACE) 40 MG tablet, 3 tablets a day for 5 days, 2 tablets a day for 5 days then 1 tablet daily, Disp: 45 tablet, Rfl: 3   norethindrone (MICRONOR) 0.35 MG tablet, TAKE (1) TABLET BY MOUTH ONCE DAILY. (Patient not taking: Reported on 07/03/2022), Disp: 28 tablet, Rfl: 12  Current Facility-Administered  Medications:    ipratropium (ATROVENT) 0.03 % nasal spray 2 spray, 2 spray, Each Nare, TID, Ward, Lenise Arena, PA-C   Medications ordered in this encounter:  No orders of the defined types were placed in this encounter.    *If you need refills on other medications prior to your next appointment, please contact your pharmacy*  Follow-Up: Call back or seek an in-person evaluation if the symptoms worsen or if the condition fails to improve as anticipated.  [place] [phone]  Other Instructions  Lumbosacral Radiculopathy Lumbosacral radiculopathy is a condition that involves the spinal nerves and nerve roots in the low back and bottom of the spine. The condition develops when these nerves and nerve roots move out of place or become inflamed and cause symptoms. What are the causes? This condition may be caused by: Pressure from a disk that bulges out of place (herniated disk). A disk is a plate of soft cartilage that separates bones in the spine. Disk changes that occur with age (disk degeneration). A narrowing of the bones of the lower back (spinal stenosis). A tumor. An infection. An injury that places sudden pressure on the disks that cushion the bones of your lower spine. What increases the risk? You are more likely to develop this condition if: You are a female who is 38-87 years old. You are a female who is 39-74 years old. You use improper technique when lifting things. You are overweight or live a sedentary lifestyle.  You smoke. Your work requires frequent lifting. You do repetitive activities that strain the spine. What are the signs or symptoms? Symptoms of this condition include: Pain that goes down from your back into your legs (sciatica), usually on one side of the body. This is the most common symptom. The pain may be worse when you sit, cough, or sneeze. Tingling and numbness in your legs. Muscle weakness in your legs. Loss of bladder control or  bowel control. How is this diagnosed? This condition may be diagnosed based on: Your symptoms and medical history. A physical exam. If the pain lasts, you may have tests, such as: MRI. X-ray. CT scan. A type of CT scan used to examine the spinal canal after injecting a dye into your spine (myelogram). A test to measure how electrical impulses move through a nerve (nerve conduction study). A test to measure the electrical activity in muscles (electromyogram). How is this treated? In many cases, treatment is not needed for this condition. With rest, the condition usually gets better over time. If treatment is needed, it may include: Working with a physical therapist to improve strength and flexibility. Taking pain medicine. Applying heat or ice or both to the affected areas. Having chiropractic spinal manipulation. Using transcutaneous electrical nerve stimulation (TENS) therapy. Getting a steroid injection in the spine. Having surgery. This may be needed if other treatments do not help. Different types of surgery may be done depending on the cause of this condition. Follow these instructions at home: Activity Avoid bending and any other activities that make the problem worse. Maintain a proper position when standing or sitting. When standing, keep your upper back and neck straight with your shoulders pulled back. Avoid slouching. When sitting, keep your back straight and relax your shoulders. Do not round your shoulders or pull them backward. Do not sit or stand in one place for long periods of time. Take brief periods of rest throughout the day. This will reduce your pain. It is usually better to rest by lying down or standing, not sitting. Mix in mild activity or stretching between long periods of rest. This will help to prevent stiffness and pain. Get regular exercise. Ask your health care provider what activities are safe for you. If you were shown how to do any exercises or  stretches, do them as told by your health care provider. You may have to avoid lifting. Ask your health care provider how much you can safely lift. Always use proper lifting technique, which includes: Bending your knees. Keeping the load close to your body. Avoiding twisting. Managing pain If directed, put ice on the affected area. To do this: Put ice in a plastic bag. Place a towel between your skin and the bag. Leave the ice on for 20 minutes, 2-3 times a day. Remove the ice if your skin turns bright red. This is very important. If you cannot feel pain, heat, or cold, you have a greater risk of damage to the area. If directed, apply heat to the affected area as often as told by your health care provider. Use the heat source that your health care provider recommends, such as a moist heat pack or a heating pad. Place a towel between your skin and the heat source. Leave the heat on for 20-30 minutes. Remove the heat if your skin turns bright red. This is especially important if you are unable to feel pain, heat, or cold. You have a greater risk of getting burned. Take over-the-counter and  prescription medicines only as told by your health care provider. General instructions Sleep on a firm mattress in a comfortable position. Try lying on your side with your knees slightly bent. If you lie on your back, put a pillow under your knees. Ask your health care provider if the medicine prescribed to you requires you to avoid driving or using machinery. If your health care provider prescribed a diet or exercise program, follow it as told. Keep all follow-up visits. This is important. Contact a health care provider if: Your pain does not get better over time, even when taking pain medicines. Get help right away if: You develop severe pain. Your pain suddenly gets worse. You develop increasing weakness in your legs. You lose the ability to control your bladder or bowel. You have difficulty walking  or balancing. You have a fever. Summary Lumbosacral radiculopathy is a condition that occurs when the spinal nerves and nerve roots in the lower part of the spine move out of place or become inflamed and cause symptoms. Symptoms include pain, numbness, and tingling that go down from your back into your legs (sciatica), muscle weakness, and loss of bladder control or bowel control. If directed, apply ice or heat or both to the affected area as told by your health care provider. Follow instructions about activity, rest, and proper lifting technique. This information is not intended to replace advice given to you by your health care provider. Make sure you discuss any questions you have with your health care provider. Document Revised: 03/24/2021 Document Reviewed: 03/24/2021 Elsevier Patient Education  [place].    If you have been instructed to have an in-person evaluation today at a local Urgent Care facility, please use the link below. It will take you to a list of all of our available [facility], including address, phone number and hours of operation. Please do not delay care.  [facility]  If you or a family member do not have a primary care provider, use the link below to schedule a visit and establish care. When you choose a [facility] primary care physician or advanced practice provider, you gain a long-term partner in health. Find a Primary Care Provider  Learn more about [facility]'s in-office and virtual care options: [facility]

## 2022-12-11 NOTE — Progress Notes (Signed)
Virtual Visit Consent   Destiny Orr, you are scheduled for a virtual visit with a [facility] provider today. Just as with appointments in the office, your consent must be obtained to participate. Your consent will be active for this visit and any virtual visit you may have with one of our providers in the next 365 days. If you have a MyChart account, a copy of this consent can be sent to you electronically.  As this is a virtual visit, video technology does not allow for your provider to perform a traditional examination. This may limit your provider's ability to fully assess your condition. If your provider identifies any concerns that need to be evaluated in person or the need to arrange testing (such as labs, EKG, etc.), we will make arrangements to do so. Although advances in technology are sophisticated, we cannot ensure that it will always work on either your end or our end. If the connection with a video visit is poor, the visit may have to be switched to a telephone visit. With either a video or telephone visit, we are not always able to ensure that we have a secure connection.  By engaging in this virtual visit, you consent to the provision of healthcare and authorize for your insurance to be billed (if applicable) for the services provided during this visit. Depending on your insurance coverage, you may receive a charge related to this service.  I need to obtain your verbal consent now. Are you willing to proceed with your visit today? Destiny Orr has provided verbal consent on 12/11/2022 for a virtual visit (video or telephone). Mar Daring, PA-C  Date: 12/11/2022 4:34 PM  Virtual Visit via Video Note   IMar Daring, connected with  Destiny Orr  ([id number], [birth year]) on 12/11/22 at  4:15 PM EDT by a video-enabled telemedicine application and verified that I am speaking with the correct person using two identifiers.  Location: Patient: Virtual Visit Location Patient:  Home Provider: Virtual Visit Location Provider: Home Office   I discussed the limitations of evaluation and management by telemedicine and the availability of in person appointments. The patient expressed understanding and agreed to proceed.    History of Present Illness: Destiny Orr is a 42 y.o. who identifies as a female who was assigned female at birth, and is being seen today for a cold, burning sensation from knee to toes. Had back and hip pain last week, saw [facility] (saw them last Weds, 12/06/22), was started on 12 day prednisone. This occurred 2 weeks ago. Started after helping her daughter move out of her apartment. It is right sided back and hip pain. Shooting pains on right from right hip to groin, down over front of thigh from lateral hip to medial knee, and down lateral side in IT band distribution. Then last night started having some tightness and stiffness in the lower legs and having cold sensation in right foot to right knee. This morning started having the cold sensation in left foot this morning. Already on Robaxin. Even since starting the prednisone she has had continued worsening symptoms with increased pain and weakness in the right leg as well.    Problems:  Patient Active Problem List   Diagnosis Date Noted   Encounter for surveillance of contraceptive pills 01/07/2021   History of abnormal cervical Pap smear 01/07/2021   Screening cholesterol level 01/07/2021   Vitamin D deficiency 01/20/2020   Hypothyroidism 11/15/2018   Abnormal Papanicolaou smear of cervix with positive  human papilloma virus (HPV) test 10/11/2018   Goiter 10/08/2018   Breast tenderness 10/08/2018   Mass of upper outer quadrant of right breast 10/08/2018   Encounter for gynecological examination with Papanicolaou smear of cervix 10/08/2018   Current smoker 07/03/2017   Leukocytosis 06/18/2017   Thyroid nodule 12/26/2016   Neck injury, initial encounter 10/20/2016   Concussion with loss of  consciousness 10/20/2016   Injury of left shoulder 10/20/2016   Sinus tachycardia 10/16/2016   Post concussion syndrome 10/03/2016   Intractable headache 10/03/2016   Dizziness 10/03/2016   Blurry vision, right eye 10/03/2016   Nodular goiter 06/18/2016   Fibromyalgia syndrome 06/16/2016   Raynaud's syndrome without gangrene 06/16/2016   Numbness and tingling sensation of skin 06/16/2016   B12 deficiency 06/16/2016   Dizziness and giddiness 06/16/2016   Pelvic pain in female 03/03/2016   Tick bite 03/03/2016   POTS (postural orthostatic tachycardia syndrome) 02/05/2016   Abnormal uterine bleeding (AUB) 12/09/2014   Depression 11/26/2014   Dysmenorrhea 07/15/2014   Irregular menstrual bleeding 07/15/2014   Back pain, chronic 04/02/2013   Chronic fatigue and malaise 12/24/2012   Anxiety 12/24/2012   PTSD (post-traumatic stress disorder) 12/24/2012   ADD (attention deficit disorder) 12/24/2012   Contraception 12/24/2012   Hx of migraine headaches 12/24/2012    Allergies:  Allergies  Allergen Reactions   Prozac [Fluoxetine]     Suicidal thoughts   Topiramate Other (See Comments)    Topamax-dizziness   Lexapro [Escitalopram Oxalate] Other (See Comments)    Flat affect, No emotions   Medications:  Current Outpatient Medications:    ALPRAZolam (XANAX) 1 MG tablet, Take 1 tablet (1 mg total) by mouth 4 (four) times daily., Disp: 120 tablet, Rfl: 2   amphetamine-dextroamphetamine (ADDERALL) 20 MG tablet, Take 1 tablet (20 mg total) by mouth 3 (three) times daily. Dx code F90.0 ADD, Disp: 90 tablet, Rfl: 0   amphetamine-dextroamphetamine (ADDERALL) 20 MG tablet, Take 1 tablet (20 mg total) by mouth 3 (three) times daily., Disp: 90 tablet, Rfl: 0   ARIPiprazole (ABILIFY) 2 MG tablet, Take 1 tablet (2 mg total) by mouth daily., Disp: 30 tablet, Rfl: 2   carbamazepine (TEGRETOL) 200 MG tablet, Take 1 tablet (200 mg total) by mouth 2 (two) times daily., Disp: 60 tablet, Rfl: 2    citalopram (CELEXA) 20 MG tablet, Take 1 tablet (20 mg total) by mouth 2 (two) times daily., Disp: 60 tablet, Rfl: 2   ibuprofen (ADVIL,MOTRIN) 200 MG tablet, Take 400-600 mg by mouth every 6 (six) hours as needed for mild pain or moderate pain., Disp: , Rfl:    megestrol (MEGACE) 40 MG tablet, 3 tablets a day for 5 days, 2 tablets a day for 5 days then 1 tablet daily, Disp: 45 tablet, Rfl: 3   norethindrone (MICRONOR) 0.35 MG tablet, TAKE (1) TABLET BY MOUTH ONCE DAILY. (Patient not taking: Reported on 07/03/2022), Disp: 28 tablet, Rfl: 12  Current Facility-Administered Medications:    ipratropium (ATROVENT) 0.03 % nasal spray 2 spray, 2 spray, Each Nare, TID, Ward, Lenise Arena, PA-C  Observations/Objective: Patient is well-developed, well-nourished in no acute distress.  Resting comfortably at home.  Head is normocephalic, atraumatic.  No labored breathing.  Speech is clear and coherent with logical content.  Patient is alert and oriented at baseline.    Assessment and Plan: 1. Lumbosacral radiculopathy  - Since patient is having progressive radicular symptoms with back pain, despite treatment with steroids and muscle relaxers, that has moved from  unilateral to bilateral I have advised emergent evaluation     Follow Up Instructions: I discussed the assessment and treatment plan with the patient. The patient was provided an opportunity to ask questions and all were answered. The patient agreed with the plan and demonstrated an understanding of the instructions.  A copy of instructions were sent to the patient via MyChart unless otherwise noted below.    The patient was advised to call back or seek an in-person evaluation if the symptoms worsen or if the condition fails to improve as anticipated.  Time:  I spent 15 minutes with the patient via telehealth technology discussing the above problems/concerns.    Mar Daring, PA-C

## 2022-12-12 ENCOUNTER — Emergency Department (HOSPITAL_COMMUNITY)
Admission: EM | Admit: 2022-12-12 | Discharge: 2022-12-12 | Disposition: A | Discharge status: Home / Self Care | Payer: Medicare Other | Attending: Emergency Medicine | Admitting: Emergency Medicine

## 2022-12-12 ENCOUNTER — Other Ambulatory Visit: Payer: Self-pay

## 2022-12-12 ENCOUNTER — Encounter (HOSPITAL_COMMUNITY): Payer: Self-pay

## 2022-12-12 DIAGNOSIS — R202 Paresthesia of skin: Secondary | ICD-10-CM | POA: Insufficient documentation

## 2022-12-12 DIAGNOSIS — M25551 Pain in right hip: Secondary | ICD-10-CM | POA: Diagnosis not present

## 2022-12-12 DIAGNOSIS — M545 Low back pain, unspecified: Secondary | ICD-10-CM | POA: Diagnosis not present

## 2022-12-12 NOTE — ED Triage Notes (Signed)
Reports was helping daughter move two weeks ago and since has been having right back/hip pain going down right leg.  Reports over the weekend her right foot started tingling.  Reports Sunday she felt foot had coldness and burning.

## 2022-12-12 NOTE — ED Notes (Signed)
+  right pedal pulse noted in triage

## 2022-12-12 NOTE — ED Provider Notes (Addendum)
[facility] Provider Note   CSN: [id number] Arrival date & time: 12/12/22  1123     History  Chief Complaint  Patient presents with   Tingling    Destiny Orr is a 42 y.o. female presented with back pain has been present for 2 weeks.  Patient states this began when she was helping her daughter move.  Patient states pain is in her right hip and radiating down to her right foot.  Patient went to go see [facility] on 12/06/2022 and was given a muscle relaxer and a steroid taper in which she is on day 6 of 12.  Patient states that she is only able to take Tylenol for pain.  Patient patient was diagnosed with a lumbar sacral radiculopathy however stated 3 days ago she began having bilateral paresthesias and had a televisit yesterday and recommended to go to the ER.  Patient states she is having feet coldness and burning.  Patient had chest pain, shortness of breath, nausea/vomiting, urinary/bowel incontinence, saddle anesthesia, weakness, differences in gait, fevers, IVDU  Home Medications Prior to Admission medications   Medication Sig Start Date End Date Taking? Authorizing Provider  ALPRAZolam Duanne Moron) 1 MG tablet Take 1 tablet (1 mg total) by mouth 4 (four) times daily. 11/27/22 11/27/23  Cloria Spring, MD  amphetamine-dextroamphetamine (ADDERALL) 20 MG tablet Take 1 tablet (20 mg total) by mouth 3 (three) times daily. Dx code F90.0 ADD 11/27/22   Cloria Spring, MD  amphetamine-dextroamphetamine (ADDERALL) 20 MG tablet Take 1 tablet (20 mg total) by mouth 3 (three) times daily. 11/27/22 11/27/23  Cloria Spring, MD  ARIPiprazole (ABILIFY) 2 MG tablet Take 1 tablet (2 mg total) by mouth daily. 11/27/22   Cloria Spring, MD  carbamazepine (TEGRETOL) 200 MG tablet Take 1 tablet (200 mg total) by mouth 2 (two) times daily. 11/27/22   Cloria Spring, MD  citalopram (CELEXA) 20 MG tablet Take 1 tablet (20 mg total) by mouth 2 (two) times daily. 11/27/22  11/27/23  Cloria Spring, MD  ibuprofen (ADVIL,MOTRIN) 200 MG tablet Take 400-600 mg by mouth every 6 (six) hours as needed for mild pain or moderate pain.    [provider]  megestrol (MEGACE) 40 MG tablet 3 tablets a day for 5 days, 2 tablets a day for 5 days then 1 tablet daily 08/29/22   Florian Buff, MD  norethindrone (MICRONOR) 0.35 MG tablet TAKE (1) TABLET BY MOUTH ONCE DAILY. Patient not taking: Reported on 07/03/2022 01/26/22   Estill Dooms, NP      Allergies    Prozac [fluoxetine], Topiramate, and Lexapro [escitalopram oxalate]    Review of Systems   Review of Systems See HPI Physical Exam Updated Vital Signs BP (!) 139/95 (BP Location: Right Arm)   Pulse (!) 122   Temp 99 F (37.2 C) (Oral)   Resp 18   Ht '5\' 5"'$  (1.651 m)   Wt 76.7 kg   SpO2 100%   BMI 28.12 kg/m  Physical Exam Musculoskeletal:     Comments: Patient able to range toes 5 out of 5 bilateral hip extension Tender to palpation on right hip bursa Tender to palpation midline lumbar region No step-offs/crepitus/abnormalities palpated Negative bilateral straight leg test  Skin:    General: Skin is warm and dry.     Capillary Refill: Capillary refill takes less than 2 seconds.  Neurological:     Mental Status: She is alert and oriented  to person, place, and time.     Sensory: Sensation is intact.     Motor: Motor function is intact.     Coordination: Coordination is intact.     Deep Tendon Reflexes:     Reflex Scores:      Patellar reflexes are 2+ on the right side and 2+ on the left side.      Achilles reflexes are 2+ on the right side and 2+ on the left side.    Comments: Sensation intact in all 4 limbs Leans forward when walking    ED Results / Procedures / Treatments   Labs (all labs ordered are listed, but only abnormal results are displayed) Labs Reviewed - No data to display  EKG None  Radiology No results found.  Procedures Procedures    Medications Ordered in  ED Medications - No data to display  ED Course/ Medical Decision Making/ A&P                             Medical Decision Making  Destiny Orr 42 y.o. presented today for back pain. Working DDx that I considered at this time includes, but not limited to, MSK, underlying fracture, epidural hematoma, cauda equina syndrome, spinal stenosis, spinal malignancy, dural abscess, discitis, spinal infection.  R/o DDx: Fracture, epidural hematoma, cauda equina, spinal stenosis, spinal malignancy, dural abscess, discitis, spinal infection: Considered but are not consistent with present presentation and physical exam  Review of prior external notes: 12/11/2022 video visit  Unique Tests and My Interpretation: None  Discussion with Independent Historian: None  Discussion of Management of Tests: None  Risk: Low:  - based on diagnostic testing/clinical impression and treatment plan  Risk Stratification Score: None  Staffed with Lajean Saver, MD  Plan: Patient presented for back pain.  On my initial exam, patient did not appear in distress and had stable vitals.  Patient did endorse lumbar midline tenderness patient was with an intact neurologic exam, tolerating ambulation with an antalgic gait and p.o. intake without difficulty.  Patient had no abnormal DTRs.  Patient endorsing complete sensation of the perineum.  Patient without episodes of fecal or urinary incontinence.  Patient has no focal neurologic deficits and reassuring vital signs at this time.  No obvious physical abnormality or injury on exam. Notably, patient denies recent trauma, is afebrile, and denies IVDU.   I suspect patient's midline tenderness is most likely a herniated disc as opposed to any life-threatening etiology at this time.  This would also be consistent with patient's paresthesias and slightly decreased sensation in her right foot.  Patient also most likely has bursitis as she was tender to palpation on her right hip bursa  which could also be contributing to her symptoms.  Due to patient's duration of symptoms and reassuring physical exam patient did not need imaging today.  I encouraged her to continue taking her prednisone, muscle relaxer, and Tylenol.  I encouraged her to monitor her symptoms and if symptoms worsen to return to ER.  Patient's heart rate was 122 however I suspect this is from the [place] she was drinking as opposed to any organic pathology.  Patient states she has an appointment with [facility] on 12/19/2022 and I will encourage her to follow-up with them.  Patient was given return precautions. Patient stable for discharge at this time.  Patient verbalized understanding of plan.         Final Clinical Impression(s) / ED Diagnoses  Final diagnoses:  Midline low back pain without sciatica, unspecified chronicity    Rx / DC Orders ED Discharge Orders     None           Elvina Sidle 12/12/22 1448    Lajean Saver, MD 12/15/22 1705

## 2022-12-12 NOTE — Discharge Instructions (Signed)
Please make your [facility] appointment at 12/19/2022 however after being discharged today please call him and see if he can have appointment earlier due to recent symptoms and ER visit.  Please continue taking your prednisone muscle relaxer as prescribed along with the Tylenol every 6 hours as needed for pain.  Please monitor your symptoms and if symptoms worsen please return to ER.

## 2022-12-18 ENCOUNTER — Other Ambulatory Visit: Payer: Self-pay | Admitting: Rehabilitation

## 2022-12-18 DIAGNOSIS — M47816 Spondylosis without myelopathy or radiculopathy, lumbar region: Secondary | ICD-10-CM

## 2022-12-18 DIAGNOSIS — M5416 Radiculopathy, lumbar region: Secondary | ICD-10-CM

## 2022-12-19 ENCOUNTER — Ambulatory Visit: Payer: Medicare Other | Admitting: Orthopaedic Surgery

## 2022-12-28 ENCOUNTER — Inpatient Hospital Stay: Admission: RE | Admit: 2022-12-28 | Payer: Medicare Other | Source: Ambulatory Visit

## 2022-12-28 ENCOUNTER — Ambulatory Visit: Payer: Medicare Other | Admitting: Orthopaedic Surgery

## 2022-12-30 ENCOUNTER — Other Ambulatory Visit: Payer: Medicare Other

## 2022-12-30 ENCOUNTER — Ambulatory Visit
Admission: RE | Admit: 2022-12-30 | Discharge: 2022-12-30 | Disposition: A | Discharge status: Home / Self Care | Payer: Medicare Other | Source: Ambulatory Visit | Attending: Rehabilitation | Admitting: Rehabilitation

## 2022-12-30 DIAGNOSIS — M47816 Spondylosis without myelopathy or radiculopathy, lumbar region: Secondary | ICD-10-CM

## 2022-12-30 DIAGNOSIS — M5416 Radiculopathy, lumbar region: Secondary | ICD-10-CM

## 2023-01-04 ENCOUNTER — Other Ambulatory Visit: Payer: Self-pay

## 2023-01-04 DIAGNOSIS — D72828 Other elevated white blood cell count: Secondary | ICD-10-CM

## 2023-01-05 ENCOUNTER — Inpatient Hospital Stay: Payer: Medicare Other | Attending: Hematology

## 2023-01-11 ENCOUNTER — Inpatient Hospital Stay: Payer: Medicare Other | Admitting: Physician Assistant

## 2023-01-15 ENCOUNTER — Encounter (HOSPITAL_COMMUNITY): Payer: Self-pay | Admitting: Psychiatry

## 2023-01-15 ENCOUNTER — Telehealth (INDEPENDENT_AMBULATORY_CARE_PROVIDER_SITE_OTHER): Payer: Medicare Other | Admitting: Psychiatry

## 2023-01-15 DIAGNOSIS — F411 Generalized anxiety disorder: Secondary | ICD-10-CM | POA: Diagnosis not present

## 2023-01-15 DIAGNOSIS — F332 Major depressive disorder, recurrent severe without psychotic features: Secondary | ICD-10-CM

## 2023-01-15 DIAGNOSIS — F901 Attention-deficit hyperactivity disorder, predominantly hyperactive type: Secondary | ICD-10-CM | POA: Diagnosis not present

## 2023-01-15 MED ORDER — ARIPIPRAZOLE 2 MG PO TABS: 2.0000 mg | ORAL_TABLET | Freq: Every day | ORAL | 2 refills | Status: DC

## 2023-01-15 MED ORDER — CARBAMAZEPINE 200 MG PO TABS: 200.0000 mg | ORAL_TABLET | Freq: Two times a day (BID) | ORAL | 2 refills | Status: DC

## 2023-01-15 MED ORDER — CITALOPRAM HYDROBROMIDE 20 MG PO TABS: 20.0000 mg | ORAL_TABLET | Freq: Two times a day (BID) | ORAL | 2 refills | Status: DC

## 2023-01-15 MED ORDER — ALPRAZOLAM 1 MG PO TABS: 1.0000 mg | ORAL_TABLET | Freq: Four times a day (QID) | ORAL | 2 refills | Status: DC

## 2023-01-15 MED ORDER — AMPHETAMINE-DEXTROAMPHETAMINE 20 MG PO TABS: 20.0000 mg | ORAL_TABLET | Freq: Three times a day (TID) | ORAL | 0 refills | Status: DC

## 2023-01-15 NOTE — Progress Notes (Signed)
BH MD/PA/NP OP Progress Note  01/15/2023 9:13 AM Destiny Orr  MRN:  [id number]  Chief Complaint:  Chief Complaint  Patient presents with   Depression   Anxiety   ADD   Follow-up   HPI: This patient is a 42 year old separated white female who lives with her ex husband 1 son and 1 daughter in [place].  She was on disability from [employer] but now get Social Security disability   Patient returns for follow-up after 2 months.  For the most part she is doing pretty well.  She states that she still has "a fairly high level of anxiety" however she is on maximal dose of Xanax and has already tried Valium clonazepam and lorazepam.  She is sleeping fairly well.  Her daughter boyfriend and the baby have moved out so she has more time to herself.  She is spending time with her other kids and taking them oncology visits.  She denies thoughts of self-harm or suicide.  She is focusing fairly well with the Adderall.  She has been painting pictures which helps her relax. Visit Diagnosis:    ICD-10-CM   1. Major depressive disorder, recurrent, severe without psychotic features  F33.2     2. GAD (generalized anxiety disorder)  F41.1     3. Attention deficit hyperactivity disorder (ADHD), predominantly hyperactive type  F90.1       Past Psychiatric History: Long-term outpatient treatment for depression and anxiety  Past Medical History:  Past Medical History:  Diagnosis Date   Abnormal Papanicolaou smear of cervix with positive human papilloma virus (HPV) test 10/11/2018   LSIL +HPV, get colpo___   Abnormal uterine bleeding (AUB) 12/09/2014   Anxiety    Arthritis    Depression    DJD (degenerative joint disease)    Dumping syndrome    Dysmenorrhea 07/15/2014   Fatigue 12/24/2012   Fibromyalgia diagnosed April 2016   Headache    Hx of migraine headaches 12/24/2012   Inappropriate sinus tachycardia    Irregular menstrual bleeding 07/15/2014   Pelvic pain in female 03/03/2016   Pneumothorax,  spontaneous, tension    Post concussion syndrome    POTS (postural orthostatic tachycardia syndrome)    PTSD (post-traumatic stress disorder)    Scoliosis    Thyroid disease    Tick bite 03/03/2016   Vitamin B 12 deficiency     Past Surgical History:  Procedure Laterality Date   bone spurs toes Right    CESAREAN SECTION     COLONOSCOPY     endooscopy     KNEE SURGERY     rotate cuff lt arm      Family Psychiatric History: See below  Family History:  Family History  Problem Relation Age of Onset   Hypertension Father    Atrial fibrillation Father    Alcohol abuse Father    Cancer Maternal Grandmother        skin    Heart disease Maternal Grandmother    Other Maternal Grandmother        had thyroid removed   Breast cancer Maternal Grandmother    Heart disease Paternal Grandfather    COPD Paternal Grandfather    Hypertension Paternal Grandfather    Diabetes Paternal Grandfather    Stroke Paternal Grandfather    Cancer Maternal Grandfather        bladder,lung   Anxiety disorder Maternal Aunt    Anxiety disorder Maternal Uncle    Bipolar disorder Maternal Uncle    Breast  cancer Maternal Uncle        CML   Leukemia Other    Colon cancer Other        lung-2 maternal great uncles    Social History:  Social History   Socioeconomic History   Marital status: Legally Separated    Spouse name: Not on file   Number of children: 3   Years of education: Not on file   Highest education level: Not on file  Occupational History   Not on file  Tobacco Use   Smoking status: Former    Packs/day: 1.00    Years: 15.00    Additional pack years: 0.00    Total pack years: 15.00    Types: Cigarettes    Start date: 03/13/1997    Quit date: 09/25/2021    Years since quitting: 1.3   Smokeless tobacco: Never   Tobacco comments:    "working on it" Was smoking 2.5 packs a day  Vaping Use   Vaping Use: Never used  Substance and Sexual Activity   Alcohol use: No   Drug use: No    Sexual activity: Yes    Partners: Male    Birth control/protection: Pill  Other Topics Concern   Not on file  Social History Narrative   Not on file   Social Determinants of Health   Financial Resource Strain: High Risk (07/03/2022)   Overall Financial Resource Strain (CARDIA)    Difficulty of Paying Living Expenses: Very hard  Food Insecurity: Food Insecurity Present (07/03/2022)   Hunger Vital Sign    Worried About Running Out of Food in the Last Year: Sometimes true    Ran Out of Food in the Last Year: Never true  Transportation Needs: No Transportation Needs (07/03/2022)   PRAPARE - Administrator, Civil Service (Medical): No    Lack of Transportation (Non-Medical): No  Physical Activity: Inactive (01/07/2021)   Exercise Vital Sign    Days of Exercise per Week: 0 days    Minutes of Exercise per Session: 0 min  Stress: Stress Concern Present (07/03/2022)   [employer] of Occupational Health - Occupational Stress Questionnaire    Feeling of Stress : Very much  Social Connections: Moderately Integrated (07/03/2022)   Social Connection and Isolation Panel [NHANES]    Frequency of Communication with Friends and Family: More than three times a week    Frequency of Social Gatherings with Friends and Family: Once a week    Attends Religious Services: 1 to 4 times per year    Active Member of [employer] or Organizations: No    Attends Banker Meetings: Never    Marital Status: Married    Allergies:  Allergies  Allergen Reactions   Prozac [Fluoxetine]     Suicidal thoughts   Topiramate Other (See Comments)    Topamax-dizziness   Lexapro [Escitalopram Oxalate] Other (See Comments)    Flat affect, No emotions    Metabolic Disorder Labs: Lab Results  Component Value Date   HGBA1C 5.4 01/07/2013   MPG 108 01/07/2013   Lab Results  Component Value Date   PROLACTIN 13.2 06/12/2016   Lab Results  Component Value Date   CHOL 204 (H) 01/07/2021    TRIG 132 01/07/2021   HDL 72 01/07/2021   CHOLHDL 2.8 01/07/2021   VLDL 31 01/07/2013   LDLCALC 109 (H) 01/07/2021   LDLCALC 80 10/08/2018   Lab Results  Component Value Date   TSH 4.080 01/07/2021  TSH 1.971 10/24/2019    Therapeutic Level Labs: Lab Results  Component Value Date   LITHIUM 0.45 (L) 01/27/2020   LITHIUM 0.24 (L) 09/03/2019   No results found for: "VALPROATE" No results found for: "CBMZ"  Current Medications: Current Outpatient Medications  Medication Sig Dispense Refill   ALPRAZolam (XANAX) 1 MG tablet Take 1 tablet (1 mg total) by mouth 4 (four) times daily. 120 tablet 2   amphetamine-dextroamphetamine (ADDERALL) 20 MG tablet Take 1 tablet (20 mg total) by mouth 3 (three) times daily. Dx code F90.0 ADD 90 tablet 0   amphetamine-dextroamphetamine (ADDERALL) 20 MG tablet Take 1 tablet (20 mg total) by mouth 3 (three) times daily. 90 tablet 0   ARIPiprazole (ABILIFY) 2 MG tablet Take 1 tablet (2 mg total) by mouth daily. 30 tablet 2   carbamazepine (TEGRETOL) 200 MG tablet Take 1 tablet (200 mg total) by mouth 2 (two) times daily. 60 tablet 2   citalopram (CELEXA) 20 MG tablet Take 1 tablet (20 mg total) by mouth 2 (two) times daily. 60 tablet 2   ibuprofen (ADVIL,MOTRIN) 200 MG tablet Take 400-600 mg by mouth every 6 (six) hours as needed for mild pain or moderate pain.     megestrol (MEGACE) 40 MG tablet 3 tablets a day for 5 days, 2 tablets a day for 5 days then 1 tablet daily 45 tablet 3   Current Facility-Administered Medications  Medication Dose Route Frequency Provider Last Rate Last Admin   ipratropium (ATROVENT) 0.03 % nasal spray 2 spray  2 spray Each Nare TID Ward, Tylene Fantasia, PA-C         Musculoskeletal: Strength & Muscle Tone: within normal limits Gait & Station: normal Patient leans: N/A  Psychiatric Specialty Exam: Review of Systems  Musculoskeletal:  Positive for arthralgias.  All other systems reviewed and are negative.   There were no  vitals taken for this visit.There is no height or weight on file to calculate BMI.  General Appearance: Casual and Fairly Groomed  Eye Contact:  Good  Speech:  Clear and Coherent  Volume:  Normal  Mood:  Euthymic  Affect:  Congruent  Thought Process:  Goal Directed  Orientation:  Full (Time, Place, and Person)  Thought Content: WDL   Suicidal Thoughts:  No  Homicidal Thoughts:  No  Memory:  Immediate;   Good Recent;   Good Remote;   Good  Judgement:  Good  Insight:  Fair  Psychomotor Activity:  Decreased  Concentration:  Concentration: Good and Attention Span: Good  Recall:  Good  Fund of Knowledge: Good  Language: Good  Akathisia:  No  Handed:  Right  AIMS (if indicated): not done  Assets:  Communication Skills Desire for Improvement Physical Health Resilience Social Support Talents/Skills  ADL's:  Intact  Cognition: WNL  Sleep:  Good   Screenings: GAD-7    Flowsheet Row [facility] [employer] [facility] Counselor from 05/31/2021 in [facility] at [place] Office Visit from 01/07/2021 in [facility]  Total GAD-7 Score 21 20 16       Mini-Mental    Flowsheet Row Office Visit from 10/25/2016 in [facility]  Total Score (max 30 points ) 29      PHQ2-9    Flowsheet Row Office Visit from 07/03/2022 in [facility] for [facility] at [facility] Video Visit from 06/29/2022 in [facility] at [facility] Video Visit from 04/11/2022 in [facility] at [place] Video Visit from 03/03/2022 in [facility] at [place] Video Visit from 02/03/2022 in [facility] at [facility] Total Score PHQ-9 Total Score 21 -- Flowsheet Row ED from 12/12/2022 in [facility] Department at  [facility] Video Visit from 06/29/2022 in [facility] Behavioral [facility] at [place] Video Visit from 04/11/2022 in [facility] Outpatient Behavior[facility] at [place]  C-SSRS RISK CATEGORY No Risk No Risk Error: Q3, 4, or 5 should not be populated when Q2 is No        Assessment and Plan: This patient is a 42 year old female with a history of depression dysthymia chronic fatigue social anxiety ADD and borderline personality traits.  She seems to be somewhat better today or at least has less complaints.  She will continue Celexa 20 mg daily as well as Xanax 1 mg 4 times daily for anxiety, Tegretol 200 mg daily for mood stabilization, Abilify 2 mg daily for augmentation and Adderall 20 mg 3 times daily for ADD.  She will return to see me in 2 months  Collaboration of Care: Collaboration of Care: Referral or follow-up with counselor/therapist AEB patient will continue therapy with Suzan Garibaldi in our office  Patient/Guardian was advised Release of Information must be obtained prior to any record release in order to collaborate their care with an outside provider. Patient/Guardian was advised if they have not already done so to contact the registration department to sign all necessary forms in order for [place] to release information regarding their care.   Consent: Patient/Guardian gives verbal consent for treatment and assignment of benefits for services provided during this visit. Patient/Guardian expressed understanding and agreed to proceed.    Diannia Ruder, MD 01/15/2023, 9:13 AM

## 2023-01-16 ENCOUNTER — Ambulatory Visit: Payer: Medicare Other | Admitting: Family Medicine

## 2023-01-25 ENCOUNTER — Other Ambulatory Visit: Payer: Self-pay | Admitting: Student in an Organized Health Care Education/Training Program

## 2023-01-25 DIAGNOSIS — M5412 Radiculopathy, cervical region: Secondary | ICD-10-CM

## 2023-02-23 ENCOUNTER — Telehealth: Payer: Medicare Other | Admitting: Physician Assistant

## 2023-02-23 DIAGNOSIS — W57XXXA Bitten or stung by nonvenomous insect and other nonvenomous arthropods, initial encounter: Secondary | ICD-10-CM

## 2023-02-23 DIAGNOSIS — R5381 Other malaise: Secondary | ICD-10-CM | POA: Diagnosis not present

## 2023-02-23 DIAGNOSIS — R519 Headache, unspecified: Secondary | ICD-10-CM | POA: Diagnosis not present

## 2023-02-23 DIAGNOSIS — R5383 Other fatigue: Secondary | ICD-10-CM

## 2023-02-23 DIAGNOSIS — S30861A Insect bite (nonvenomous) of abdominal wall, initial encounter: Secondary | ICD-10-CM

## 2023-02-23 DIAGNOSIS — R110 Nausea: Secondary | ICD-10-CM | POA: Diagnosis not present

## 2023-02-23 MED ORDER — DOXYCYCLINE HYCLATE 100 MG PO TABS
100.0000 mg | ORAL_TABLET | Freq: Two times a day (BID) | ORAL | 0 refills | Status: AC
Start: 2023-02-23 — End: 2023-03-16

## 2023-02-23 MED ORDER — ONDANSETRON 4 MG PO TBDP
4.0000 mg | ORAL_TABLET | Freq: Three times a day (TID) | ORAL | 1 refills | Status: DC | PRN
Start: 2023-02-23 — End: 2023-10-01

## 2023-02-23 NOTE — Progress Notes (Signed)
Virtual Visit Consent   Destiny Orr, you are scheduled for a virtual visit with a [facility] provider today. Just as with appointments in the office, your consent must be obtained to participate. Your consent will be active for this visit and any virtual visit you may have with one of our providers in the next 365 days. If you have a MyChart account, a copy of this consent can be sent to you electronically.  As this is a virtual visit, video technology does not allow for your provider to perform a traditional examination. This may limit your provider's ability to fully assess your condition. If your provider identifies any concerns that need to be evaluated in person or the need to arrange testing (such as labs, EKG, etc.), we will make arrangements to do so. Although advances in technology are sophisticated, we cannot ensure that it will always work on either your end or our end. If the connection with a video visit is poor, the visit may have to be switched to a telephone visit. With either a video or telephone visit, we are not always able to ensure that we have a secure connection.  By engaging in this virtual visit, you consent to the provision of healthcare and authorize for your insurance to be billed (if applicable) for the services provided during this visit. Depending on your insurance coverage, you may receive a charge related to this service.  I need to obtain your verbal consent now. Are you willing to proceed with your visit today? Celesta Bockover has provided verbal consent on 02/23/2023 for a virtual visit (video or telephone). Margaretann Loveless, PA-C  Date: 02/23/2023 5:52 PM  Virtual Visit via Video Note   IMargaretann Loveless, connected with  Keely Kemner  ([id number], Jul 11, 1981) on 02/23/23 at  5:45 PM EDT by a video-enabled telemedicine application and verified that I am speaking with the correct person using two identifiers.  Location: Patient: Virtual Visit Location Patient:  Mobile Provider: Virtual Visit Location Provider: Home Office   I discussed the limitations of evaluation and management by telemedicine and the availability of in person appointments. The patient expressed understanding and agreed to proceed.    History of Present Illness: Destiny Orr is a 42 y.o. who identifies as a female who was assigned female at birth, and is being seen today for tick bite.  HPI: HPI   [place] tick removed from abdomen on Tuesday; had been weed eating Monday and may have tick gotten then. Having hot and cold flashes, low grade fevers, headache, nausea, sore and stiff neck, body aches, general malaise and fatigue all progressively worsening since. Having mild dizziness, feeling off balance. Woke up at 3am with stomach pain today and has had increasing nausea. Headache has also acutely worsened today as well, which is what has led to her to seek care.    Problems:  Patient Active Problem List   Diagnosis Date Noted   Encounter for surveillance of contraceptive pills 01/07/2021   History of abnormal cervical Pap smear 01/07/2021   Screening cholesterol level 01/07/2021   Vitamin D deficiency 01/20/2020   Hypothyroidism 11/15/2018   Abnormal Papanicolaou smear of cervix with positive human papilloma virus (HPV) test 10/11/2018   Goiter 10/08/2018   Breast tenderness 10/08/2018   Mass of upper outer quadrant of right breast 10/08/2018   Encounter for gynecological examination with Papanicolaou smear of cervix 10/08/2018   Current smoker 07/03/2017   Leukocytosis 06/18/2017   Thyroid nodule 12/26/2016  Neck injury, initial encounter 10/20/2016   Concussion with loss of consciousness 10/20/2016   Injury of left shoulder 10/20/2016   Sinus tachycardia 10/16/2016   Post concussion syndrome 10/03/2016   Intractable headache 10/03/2016   Dizziness 10/03/2016   Blurry vision, right eye 10/03/2016   Nodular goiter 06/18/2016   Fibromyalgia syndrome 06/16/2016    Raynaud's syndrome without gangrene 06/16/2016   Numbness and tingling sensation of skin 06/16/2016   B12 deficiency 06/16/2016   Dizziness and giddiness 06/16/2016   Pelvic pain in female 03/03/2016   Tick bite 03/03/2016   POTS (postural orthostatic tachycardia syndrome) 02/05/2016   Abnormal uterine bleeding (AUB) 12/09/2014   Depression 11/26/2014   Dysmenorrhea 07/15/2014   Irregular menstrual bleeding 07/15/2014   Back pain, chronic 04/02/2013   Chronic fatigue and malaise 12/24/2012   Anxiety 12/24/2012   PTSD (post-traumatic stress disorder) 12/24/2012   ADD (attention deficit disorder) 12/24/2012   Contraception 12/24/2012   Hx of migraine headaches 12/24/2012    Allergies:  Allergies  Allergen Reactions   Prozac [Fluoxetine]     Suicidal thoughts   Topiramate Other (See Comments)    Topamax-dizziness   Lexapro [Escitalopram Oxalate] Other (See Comments)    Flat affect, No emotions   Medications:  Current Outpatient Medications:    doxycycline (VIBRA-TABS) 100 MG tablet, Take 1 tablet (100 mg total) by mouth 2 (two) times daily for 21 days., Disp: 42 tablet, Rfl: 0   ondansetron (ZOFRAN-ODT) 4 MG disintegrating tablet, Take 1 tablet (4 mg total) by mouth every 8 (eight) hours as needed., Disp: 20 tablet, Rfl: 1   ALPRAZolam (XANAX) 1 MG tablet, Take 1 tablet (1 mg total) by mouth 4 (four) times daily., Disp: 120 tablet, Rfl: 2   amphetamine-dextroamphetamine (ADDERALL) 20 MG tablet, Take 1 tablet (20 mg total) by mouth 3 (three) times daily. Dx code F90.0 ADD, Disp: 90 tablet, Rfl: 0   amphetamine-dextroamphetamine (ADDERALL) 20 MG tablet, Take 1 tablet (20 mg total) by mouth 3 (three) times daily., Disp: 90 tablet, Rfl: 0   ARIPiprazole (ABILIFY) 2 MG tablet, Take 1 tablet (2 mg total) by mouth daily., Disp: 30 tablet, Rfl: 2   carbamazepine (TEGRETOL) 200 MG tablet, Take 1 tablet (200 mg total) by mouth 2 (two) times daily., Disp: 60 tablet, Rfl: 2   citalopram  (CELEXA) 20 MG tablet, Take 1 tablet (20 mg total) by mouth 2 (two) times daily., Disp: 60 tablet, Rfl: 2   ibuprofen (ADVIL,MOTRIN) 200 MG tablet, Take 400-600 mg by mouth every 6 (six) hours as needed for mild pain or moderate pain., Disp: , Rfl:    megestrol (MEGACE) 40 MG tablet, 3 tablets a day for 5 days, 2 tablets a day for 5 days then 1 tablet daily, Disp: 45 tablet, Rfl: 3  Current Facility-Administered Medications:    ipratropium (ATROVENT) 0.03 % nasal spray 2 spray, 2 spray, Each Nare, TID, Ward, Tylene Fantasia, PA-C  Observations/Objective: Patient is well-developed, well-nourished in no acute distress.  Resting comfortably   Head is normocephalic, atraumatic.  No labored breathing.  Speech is clear and coherent with logical content.  Patient is alert and oriented at baseline.    Assessment and Plan: 1. Acute intractable headache, unspecified headache type - doxycycline (VIBRA-TABS) 100 MG tablet; Take 1 tablet (100 mg total) by mouth 2 (two) times daily for 21 days.  Dispense: 42 tablet; Refill: 0  2. Tick bite of abdominal wall, initial encounter - doxycycline (VIBRA-TABS) 100 MG tablet; Take 1 tablet (  100 mg total) by mouth 2 (two) times daily for 21 days.  Dispense: 42 tablet; Refill: 0  3. Nausea - doxycycline (VIBRA-TABS) 100 MG tablet; Take 1 tablet (100 mg total) by mouth 2 (two) times daily for 21 days.  Dispense: 42 tablet; Refill: 0 - ondansetron (ZOFRAN-ODT) 4 MG disintegrating tablet; Take 1 tablet (4 mg total) by mouth every 8 (eight) hours as needed.  Dispense: 20 tablet; Refill: 1  4. Malaise and fatigue - doxycycline (VIBRA-TABS) 100 MG tablet; Take 1 tablet (100 mg total) by mouth 2 (two) times daily for 21 days.  Dispense: 42 tablet; Refill: 0  - Symptomatic tick bite - Keep wound clean and dry - Doxycycline x 21 days started - Zofran for nausea - Start Probiotic - If symptoms worsen seek immediate care in person  Follow Up Instructions: I discussed  the assessment and treatment plan with the patient. The patient was provided an opportunity to ask questions and all were answered. The patient agreed with the plan and demonstrated an understanding of the instructions.  A copy of instructions were sent to the patient via MyChart unless otherwise noted below.    The patient was advised to call back or seek an in-person evaluation if the symptoms worsen or if the condition fails to improve as anticipated.  Time:  I spent 10 minutes with the patient via telehealth technology discussing the above problems/concerns.    Margaretann Loveless, PA-C

## 2023-02-23 NOTE — Patient Instructions (Signed)
Destiny Orr, thank you for joining Margaretann Loveless, PA-C for today's virtual visit.  While this provider is not your primary care provider (PCP), if your PCP is located in our provider database this encounter information will be shared with them immediately following your visit.   A [facility] MyChart account gives you access to today's visit and all your visits, tests, and labs performed at [facility] " click here if you don't have a [facility] MyChart account or go to [email or web address]  Consent: (Patient) Destiny Orr provided verbal consent for this virtual visit at the beginning of the encounter.  Current Medications:  Current Outpatient Medications:    doxycycline (VIBRA-TABS) 100 MG tablet, Take 1 tablet (100 mg total) by mouth 2 (two) times daily for 21 days., Disp: 42 tablet, Rfl: 0   ondansetron (ZOFRAN-ODT) 4 MG disintegrating tablet, Take 1 tablet (4 mg total) by mouth every 8 (eight) hours as needed., Disp: 20 tablet, Rfl: 1   ALPRAZolam (XANAX) 1 MG tablet, Take 1 tablet (1 mg total) by mouth 4 (four) times daily., Disp: 120 tablet, Rfl: 2   amphetamine-dextroamphetamine (ADDERALL) 20 MG tablet, Take 1 tablet (20 mg total) by mouth 3 (three) times daily. Dx code F90.0 ADD, Disp: 90 tablet, Rfl: 0   amphetamine-dextroamphetamine (ADDERALL) 20 MG tablet, Take 1 tablet (20 mg total) by mouth 3 (three) times daily., Disp: 90 tablet, Rfl: 0   ARIPiprazole (ABILIFY) 2 MG tablet, Take 1 tablet (2 mg total) by mouth daily., Disp: 30 tablet, Rfl: 2   carbamazepine (TEGRETOL) 200 MG tablet, Take 1 tablet (200 mg total) by mouth 2 (two) times daily., Disp: 60 tablet, Rfl: 2   citalopram (CELEXA) 20 MG tablet, Take 1 tablet (20 mg total) by mouth 2 (two) times daily., Disp: 60 tablet, Rfl: 2   ibuprofen (ADVIL,MOTRIN) 200 MG tablet, Take 400-600 mg by mouth every 6 (six) hours as needed for mild pain or moderate pain., Disp: , Rfl:    megestrol (MEGACE) 40 MG tablet, 3  tablets a day for 5 days, 2 tablets a day for 5 days then 1 tablet daily, Disp: 45 tablet, Rfl: 3  Current Facility-Administered Medications:    ipratropium (ATROVENT) 0.03 % nasal spray 2 spray, 2 spray, Each Nare, TID, Ward, Tylene Fantasia, PA-C   Medications ordered in this encounter:  Meds ordered this encounter  Medications   doxycycline (VIBRA-TABS) 100 MG tablet    Sig: Take 1 tablet (100 mg total) by mouth 2 (two) times daily for 21 days.    Dispense:  42 tablet    Refill:  0    Order Specific Question:   Supervising Provider    Answer:   LAMPTEY, PHILIP O [[id number]]   ondansetron (ZOFRAN-ODT) 4 MG disintegrating tablet    Sig: Take 1 tablet (4 mg total) by mouth every 8 (eight) hours as needed.    Dispense:  20 tablet    Refill:  1    Order Specific Question:   Supervising Provider    Answer:   Merrilee Jansky [id number]     *If you need refills on other medications prior to your next appointment, please contact your pharmacy*  Follow-Up: Call back or seek an in-person evaluation if the symptoms worsen or if the condition fails to improve as anticipated.  [facility] [phone]  Other Instructions Tick Bite Information, Adult  Ticks are insects that draw blood for food. They climb onto people and animals that brush against  the leaves and grasses that they live in. They then bite and attach to the skin. Most ticks are harmless, but some ticks may carry germs that can cause disease. These germs are spread to a person through a bite. To lower your risk of getting a disease from a tick bite, make sure you: Take steps to prevent tick bites. Check for ticks after being outdoors where ticks live. Watch for symptoms of disease if a tick attached to you or if you think a tick bit you. How can I prevent tick bites? Take these steps to help prevent tick bites when you go outdoors in an area where ticks live: Before you go outdoors: Wear long sleeves and long pants to  protect your skin from ticks. Wear light-colored clothing so you can see ticks easier. Tuck your pant legs into your socks. Apply insect repellent that has DEET (20% or higher), picaridin, or IR3535 in it to the following areas: Any bare skin. Avoid areas around the eyes and mouth. Edges of clothing, like the top of your boots, the bottom of your pant legs, and your sleeve cuffs. Consider applying an insect repellant that contains permethrin. Follow the instructions on the label. Do not apply permethrin directly to the skin. Instead, apply to the following areas: Clothing and shoes. Outdoor gear and tents. When you are outdoors: Avoid walking through areas with long grass. If you are walking on a trail, stay in the middle of the trail so your skin, hair, and clothing do not touch the bushes. Check for ticks on your clothing, hair, and skin often while you are outdoors. Check again before you go inside. When you go indoors: Check your clothing for ticks. Tumble dry clothes in a dryer on high heat for at least 10 minutes. If clothes are damp, additional time may be needed. If clothes require washing, use hot water. Check your gear and pets. Shower soon after being outdoors. Check your body for ticks. Do a full body check using a mirror. Be sure to check your scalp, neck, armpits, waist, groin, and joint areas. These are the spots where ticks attach themselves most often. What is the best way to remove a tick?  Remove the tick as soon as possible. Removing it can prevent germs from passing to your body. Do not remove the tick with your bare fingers. Do not try to remove a tick with heat, alcohol, petroleum jelly, or fingernail polish. These things can cause the tick to salivate and regurgitate into your bloodstream, increasing your risk of getting a disease. To remove a tick that is crawling on your skin: Go outside and brush the tick off. Use tape or a lint roller. To remove a tick that is  attached to your skin: Wash your hands. If you have gloves, put them on. Use a fine-tipped tweezer, curved forceps, or a tick-removal tool to gently grasp the tick as close to your skin and the tick's head as possible. Gently pull with a steady, upward, and even pressure until the tick lets go. While removing the tick: Take care to keep the tick's head attached to its body. Do not twist or jerk the tick. This can make the tick's head or mouth parts break off and stay in your skin. If this happens, try to remove the mouth parts with tweezers. If you cannot remove them, leave the area alone and let the skin heal. Do not squeeze or crush the tick's body. This could force disease-carrying fluids  from the tick into your body. What should I do after removing a tick? Clean the bite area and your hands with soap and water, rubbing alcohol, or an iodine scrub. If an antiseptic cream or ointment is available, put a small amount on the bite area. Wash and disinfect any tools that you used to remove the tick. How should I dispose of a tick? To dispose of a live tick, use one of these methods: Place it in rubbing alcohol. Place it in a sealed bag or container, and throw it away. Wrap it tightly in tape, and throw it away. Flush it down the toilet. Where to find more information Centers for Disease Control and Prevention: GyrateAtrophy.si U.S. Environmental Protection Agency: RelocationNetworking.fi Contact a health care provider if: You have symptoms of a disease after a tick bite. Symptoms of a tick-borne disease can occur from moments after the tick bites to 30 days after a tick is removed. Symptoms include: Fever or chills. A red rash that makes a circle (bull's-eye rash) in the bite area. Redness and swelling in the bite area. Headache or stiff neck. Muscle, joint, or bone pain. Abnormal tiredness. Numbness in your legs or trouble walking or moving your legs. Tender or swollen lymph  glands. Abdominal pain, vomiting, diarrhea, or weight loss. Get help right away if: You are not able to remove a tick. You have muscle weakness or paralysis. Your symptoms get worse or you experience new symptoms. You find an engorged tick on your skin and you are in an area where there is a higher risk of disease from ticks. Summary Ticks may carry germs that can spread to a person through a bite. These germs can cause disease. Wear protective clothing and use insect repellent to prevent tick bites. Follow the instructions on the label. If you find a tick on your body, remove it as soon as possible. If the tick is attached, do not try to remove it with heat, alcohol, petroleum jelly, or fingernail polish. If you have symptoms of a disease after being bitten by a tick, contact a health care provider. This information is not intended to replace advice given to you by your health care provider. Make sure you discuss any questions you have with your health care provider. Document Revised: 12/19/2021 Document Reviewed: 12/19/2021 Elsevier Patient Education  2024 Elsevier Inc.    If you have been instructed to have an in-person evaluation today at a local Urgent Care facility, please use the link below. It will take you to a list of all of our available [facility], including address, phone number and hours of operation. Please do not delay care.  [facility]  If you or a family member do not have a primary care provider, use the link below to schedule a visit and establish care. When you choose a [facility] primary care physician or advanced practice provider, you gain a long-term partner in health. Find a Primary Care Provider  Learn more about [facility]'s in-office and virtual care options: [place] [facility] - Get Care Now

## 2023-03-10 ENCOUNTER — Encounter (HOSPITAL_COMMUNITY): Payer: Self-pay | Admitting: *Deleted

## 2023-03-10 ENCOUNTER — Emergency Department (HOSPITAL_COMMUNITY): Payer: Medicare Other

## 2023-03-10 ENCOUNTER — Other Ambulatory Visit: Payer: Self-pay

## 2023-03-10 ENCOUNTER — Emergency Department (HOSPITAL_COMMUNITY)
Admission: EM | Admit: 2023-03-10 | Discharge: 2023-03-10 | Disposition: A | Discharge status: Home / Self Care | Payer: Medicare Other | Attending: Emergency Medicine | Admitting: Emergency Medicine

## 2023-03-10 DIAGNOSIS — S99921A Unspecified injury of right foot, initial encounter: Secondary | ICD-10-CM

## 2023-03-10 DIAGNOSIS — W2203XA Walked into furniture, initial encounter: Secondary | ICD-10-CM | POA: Diagnosis not present

## 2023-03-10 NOTE — ED Triage Notes (Signed)
Pt with right foot pain, pt states she stumped her great toe and it bent back.  Pt with swelling. Injured yesterday.

## 2023-03-10 NOTE — ED Provider Notes (Addendum)
[place] EMERGENCY DEPARTMENT AT [facility] Provider Note   CSN: [id number] Arrival date & time: 03/10/23  1221     History  Chief Complaint  Patient presents with   Foot Injury    Destiny Orr is a 42 y.o. female.  States that yesterday she packing to go home from the beach and hit her foot off of a piece of furniture, states she bit her toes back.  She has baseline bone spurs at her MTP joints in her great toes bilaterally and states she does has pain without increased pain and swelling.  She is able to bear some weight.  He notes that this morning she had some increased swelling and burning pain in the area that felt like when she had a prior fracture.  Pain is located at the MTP joint of the right great toe.  She does have some mild pain to the lateral foot but states this feels more like a sprain and is a stretching feeling when she moves her foot.   Foot Injury      Home Medications Prior to Admission medications   Medication Sig Start Date End Date Taking? Authorizing Provider  ALPRAZolam Prudy Feeler) 1 MG tablet Take 1 tablet (1 mg total) by mouth 4 (four) times daily. 01/15/23 01/15/24  Myrlene Broker, MD  amphetamine-dextroamphetamine (ADDERALL) 20 MG tablet Take 1 tablet (20 mg total) by mouth 3 (three) times daily. Dx code F90.0 ADD 01/15/23   Myrlene Broker, MD  amphetamine-dextroamphetamine (ADDERALL) 20 MG tablet Take 1 tablet (20 mg total) by mouth 3 (three) times daily. 01/15/23 01/15/24  Myrlene Broker, MD  ARIPiprazole (ABILIFY) 2 MG tablet Take 1 tablet (2 mg total) by mouth daily. 01/15/23   Myrlene Broker, MD  carbamazepine (TEGRETOL) 200 MG tablet Take 1 tablet (200 mg total) by mouth 2 (two) times daily. 01/15/23   Myrlene Broker, MD  citalopram (CELEXA) 20 MG tablet Take 1 tablet (20 mg total) by mouth 2 (two) times daily. 01/15/23 01/15/24  Myrlene Broker, MD  doxycycline (VIBRA-TABS) 100 MG tablet Take 1 tablet (100 mg total) by mouth 2 (two) times daily  for 21 days. 02/23/23 03/16/23  Margaretann Loveless, PA-C  ibuprofen (ADVIL,MOTRIN) 200 MG tablet Take 400-600 mg by mouth every 6 (six) hours as needed for mild pain or moderate pain.    [provider]  megestrol (MEGACE) 40 MG tablet 3 tablets a day for 5 days, 2 tablets a day for 5 days then 1 tablet daily 08/29/22   Lazaro Arms, MD  ondansetron (ZOFRAN-ODT) 4 MG disintegrating tablet Take 1 tablet (4 mg total) by mouth every 8 (eight) hours as needed. 02/23/23   Margaretann Loveless, PA-C      Allergies    Prozac [fluoxetine], Topiramate, and Lexapro [escitalopram oxalate]    Review of Systems   Review of Systems  Physical Exam Updated Vital Signs BP 127/87   Pulse 90   Temp 98.1 F (36.7 C) (Oral)   Resp 16   Ht 5\' 5"  (1.651 m)   Wt 69.9 kg   SpO2 100%   BMI 25.63 kg/m  Physical Exam Vitals and nursing note reviewed.  Constitutional:      General: She is not in acute distress.    Appearance: She is well-developed.  HENT:     Head: Normocephalic and atraumatic.     Mouth/Throat:     Mouth: Mucous membranes are moist.  Eyes:  Conjunctiva/sclera: Conjunctivae normal.  Cardiovascular:     Rate and Rhythm: Normal rate and regular rhythm.     Heart sounds: No murmur heard. Pulmonary:     Effort: Pulmonary effort is normal. No respiratory distress.     Breath sounds: Normal breath sounds.  Musculoskeletal:        General: Swelling present.     Cervical back: Neck supple.     Comments: Tenderness and swelling noted to the right first MTP joint.  No crepitus.  No tenderness to the DIP joint of the right great toe or the distal phalanx of the right great toe otherwise.  No other point tenderness of the foot, no tenderness of the ankle calf or knee   Skin:    General: Skin is warm and dry.     Capillary Refill: Capillary refill takes less than 2 seconds.     Findings: No erythema.  Neurological:     General: No focal deficit present.     Mental Status: She  is alert and oriented to person, place, and time.  Psychiatric:        Mood and Affect: Mood normal.     ED Results / Procedures / Treatments   Labs (all labs ordered are listed, but only abnormal results are displayed) Labs Reviewed - No data to display  EKG None  Radiology DG Foot Complete Right  Result Date: 03/10/2023 CLINICAL DATA:  Right foot pain, stubbed great toe. EXAM: RIGHT FOOT COMPLETE - 3+ VIEW COMPARISON:  Right foot radiographs 05/28/2019 FINDINGS: There is lucency through a medially oriented osteophyte at the base of the great toe distal phalanx which could reflect an acute fracture. There is no other evidence of acute fracture. Bony alignment is normal. There is advanced degenerative change of the great toe MTP joint, similar to 2020. The other joint spaces are preserved. There is no erosive change. The soft tissues are unremarkable. IMPRESSION: 1. Lucency through a medially oriented osteophyte at the base of the great toe distal phalanx may reflect an acutely fractured osteophyte. Correlate with point tenderness. 2. No other acute fracture or dislocation in the foot. 3. Advanced degenerative change of the great toe MTP joint. Electronically Signed   By: Lesia Hausen M.D.   On: 03/10/2023 13:07    Procedures Procedures    Medications Ordered in ED Medications - No data to display  ED Course/ Medical Decision Making/ A&P                             Medical Decision Making DDx: Fracture, sprain, strain, contusion, other ED course: Patient presents for right foot pain after injury yesterday.  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  X-ray shows a possible lucency through the base of the distal phalanx of the right great toe through an osteophyte but patient has no tenderness here, her area of tenderness is where she has her severe arthritis, no acute fracture seen  here I discussed with patient we will treat her conservatively with crutches and a postop shoe and have her follow-up with orthopedics.  Discussed risk of radiographically occult fracture, advised to continue Tylenol Motrin as needed at home for pain.  Advised on strict return precautions.  Amount and/or Complexity of Data Reviewed Radiology: ordered and independent interpretation performed.    Details: Foot x-ray possible  lucency through osteophyte at base of right first MTP, degenerative changes first MTP.  I agree with radiology read           Final Clinical Impression(s) / ED Diagnoses Final diagnoses:  Injury of right foot, initial encounter    Rx / DC Orders ED Discharge Orders     None         Ma Rings, PA-C 03/10/23 1651    Ma Rings, PA-C 03/10/23 1653    Eber Hong, MD 03/10/23 1711

## 2023-03-10 NOTE — ED Notes (Signed)
Patient placed in post-op shoe and discharged with crutches

## 2023-03-10 NOTE — Discharge Instructions (Signed)
In today for foot pain.  Your x-ray showed a possible fracture at your big toe, but this is not the area of pain.  The area of pain did not show any acute fractures on the x-ray.  You are being given a postop shoe and crutches, follow-up closely with the orthopedic doctor and come back to the ER if you have any new or worsening symptoms.  Can continue taking Tylenol and Motrin as needed for pain at home.

## 2023-03-12 ENCOUNTER — Telehealth: Payer: Self-pay

## 2023-03-12 NOTE — Telephone Encounter (Signed)
Patient left vm message asking for a return call, stating that she has a question. I returned her call and had to leave a message asking her to call the office.

## 2023-03-13 ENCOUNTER — Telehealth: Payer: Self-pay | Admitting: Emergency Medicine

## 2023-03-13 ENCOUNTER — Encounter: Payer: Self-pay | Admitting: Orthopedic Surgery

## 2023-03-13 ENCOUNTER — Ambulatory Visit: Payer: Medicare Other | Admitting: Orthopedic Surgery

## 2023-03-13 ENCOUNTER — Ambulatory Visit
Admission: RE | Admit: 2023-03-13 | Discharge: 2023-03-13 | Disposition: A | Discharge status: Home / Self Care | Payer: Medicare Other | Source: Ambulatory Visit

## 2023-03-13 ENCOUNTER — Other Ambulatory Visit: Payer: Self-pay

## 2023-03-13 VITALS — BP 171/109 | HR 88 | Ht 64.5 in | Wt 163.0 lb

## 2023-03-13 VITALS — BP 173/85 | HR 107 | Temp 98.8°F | Resp 20

## 2023-03-13 DIAGNOSIS — S30861A Insect bite (nonvenomous) of abdominal wall, initial encounter: Secondary | ICD-10-CM

## 2023-03-13 DIAGNOSIS — M79674 Pain in right toe(s): Secondary | ICD-10-CM | POA: Diagnosis not present

## 2023-03-13 DIAGNOSIS — W57XXXA Bitten or stung by nonvenomous insect and other nonvenomous arthropods, initial encounter: Secondary | ICD-10-CM

## 2023-03-13 DIAGNOSIS — R519 Headache, unspecified: Secondary | ICD-10-CM

## 2023-03-13 NOTE — Discharge Instructions (Signed)
Please go directly to the emergency room for further evaluation and management of your symptoms.

## 2023-03-13 NOTE — ED Triage Notes (Signed)
Pt reports removed tick on Monday 5/20. Reports was seen via televisit for possible rocky [place] spotted fever. Pt reports was px doxy for 21 days and reports only made it 8 days into prescription due to losing weight and being unable to keep anything down even with zofran px. Pt reports continued intermittent headache, light sensitivity, and "red dots" to abdomen.

## 2023-03-13 NOTE — Telephone Encounter (Signed)
Pt called back post discharge and inquired if had to follow-up with [facility] ED. Discussed that pt needs higher level of care and that can seek that at any ED. Pt inquired about what to tell ED when arrives. Discussed headache and tick related symptoms. Pt verbalized understanding.

## 2023-03-13 NOTE — Progress Notes (Signed)
New Patient Visit  Assessment: Destiny Orr is a 42 y.o. female with the following: 1. Great toe pain, right  Plan: Destiny Orr slipped, stubbed her toe and caused a flexion type injury.  She has a history of MTP arthritis.  Radiographs demonstrates a possible fracture through an osteophyte.  She has swelling, bruising and decreased motion in her toes as a result.  Provided reassurance, and anticipate that this will continue to improve.  Okay to bear weight through her heel.  Continue to wear the postop shoe.  Medications as needed.  Elevate your foot to help with swelling.  Follow-up in 3 weeks.  Follow-up: Return in about 3 weeks (around 04/03/2023).  Subjective:  Chief Complaint  Patient presents with   Foot Injury    At beach slipped in grass/ uneven hit toe and foot felt like it folded under 03/09/23    History of Present Illness: Destiny Orr is a 42 y.o. female who presents for evaluation of foot pain.  She was at the beach last week, and when she slipped, flex her toe and had immediate pain.  She has known arthritis of the first MTP joint.  She notes some swelling, bruising and burning sensations at the MTP joint.  She also has difficulty with motion of the lesser toes.  She is been wearing a postop shoe which provide some stability.  Occasional medications.   Review of Systems: No fevers or chills No numbness or tingling No chest pain No shortness of breath No bowel or bladder dysfunction No GI distress No headaches   Medical History:  Past Medical History:  Diagnosis Date   Abnormal Papanicolaou smear of cervix with positive human papilloma virus (HPV) test 10/11/2018   LSIL +HPV, get colpo___   Abnormal uterine bleeding (AUB) 12/09/2014   Anxiety    Arthritis    Depression    DJD (degenerative joint disease)    Dumping syndrome    Dysmenorrhea 07/15/2014   Fatigue 12/24/2012   Fibromyalgia diagnosed April 2016   Headache    Hx of migraine headaches 12/24/2012    Inappropriate sinus tachycardia    Irregular menstrual bleeding 07/15/2014   Pelvic pain in female 03/03/2016   Pneumothorax, spontaneous, tension    Post concussion syndrome    POTS (postural orthostatic tachycardia syndrome)    PTSD (post-traumatic stress disorder)    Scoliosis    Thyroid disease    Tick bite 03/03/2016   Vitamin B 12 deficiency     Past Surgical History:  Procedure Laterality Date   bone spurs toes Right    CESAREAN SECTION     COLONOSCOPY     endooscopy     KNEE SURGERY     rotate cuff lt arm      Family History  Problem Relation Age of Onset   Hypertension Father    Atrial fibrillation Father    Alcohol abuse Father    Cancer Maternal Grandmother        skin    Heart disease Maternal Grandmother    Other Maternal Grandmother        had thyroid removed   Breast cancer Maternal Grandmother    Heart disease Paternal Grandfather    COPD Paternal Grandfather    Hypertension Paternal Grandfather    Diabetes Paternal Grandfather    Stroke Paternal Grandfather    Cancer Maternal Grandfather        bladder,lung   Anxiety disorder Maternal Aunt    Anxiety disorder Maternal Uncle  Bipolar disorder Maternal Uncle    Breast cancer Maternal Uncle        CML   Leukemia Other    Colon cancer Other        lung-2 maternal great uncles   Social History   Tobacco Use   Smoking status: Former    Packs/day: 1.00    Years: 15.00    Additional pack years: 0.00    Total pack years: 15.00    Types: Cigarettes    Start date: 03/13/1997    Quit date: 09/25/2021    Years since quitting: 1.4   Smokeless tobacco: Never   Tobacco comments:    "working on it" Was smoking 2.5 packs a day  Vaping Use   Vaping Use: Never used  Substance Use Topics   Alcohol use: No   Drug use: No    Allergies  Allergen Reactions   Prozac [Fluoxetine]     Suicidal thoughts   Topiramate Other (See Comments)    Topamax-dizziness   Lexapro [Escitalopram Oxalate] Other (See  Comments)    Flat affect, No emotions    Current Meds  Medication Sig   ALPRAZolam (XANAX) 1 MG tablet Take 1 tablet (1 mg total) by mouth 4 (four) times daily.   amphetamine-dextroamphetamine (ADDERALL) 20 MG tablet Take 1 tablet (20 mg total) by mouth 3 (three) times daily. Dx code F90.0 ADD   amphetamine-dextroamphetamine (ADDERALL) 20 MG tablet Take 1 tablet (20 mg total) by mouth 3 (three) times daily.   ARIPiprazole (ABILIFY) 2 MG tablet Take 1 tablet (2 mg total) by mouth daily.   carbamazepine (TEGRETOL) 200 MG tablet Take 1 tablet (200 mg total) by mouth 2 (two) times daily.   citalopram (CELEXA) 20 MG tablet Take 1 tablet (20 mg total) by mouth 2 (two) times daily.   doxycycline (VIBRA-TABS) 100 MG tablet Take 1 tablet (100 mg total) by mouth 2 (two) times daily for 21 days.   ibuprofen (ADVIL,MOTRIN) 200 MG tablet Take 400-600 mg by mouth every 6 (six) hours as needed for mild pain or moderate pain.   megestrol (MEGACE) 40 MG tablet 3 tablets a day for 5 days, 2 tablets a day for 5 days then 1 tablet daily   ondansetron (ZOFRAN-ODT) 4 MG disintegrating tablet Take 1 tablet (4 mg total) by mouth every 8 (eight) hours as needed.   Current Facility-Administered Medications for the 03/13/23 encounter (Office Visit) with Oliver Barre, MD  Medication   ipratropium (ATROVENT) 0.03 % nasal spray 2 spray    Objective: BP (!) 171/109   Pulse 88   Ht 5' 4.5" (1.638 m)   Wt 163 lb (73.9 kg)   BMI 27.55 kg/m   Physical Exam:  General: Alert and oriented. and No acute distress. Gait: Right sided antalgic gait.  Evaluation of right foot demonstrates diffuse swelling over the great toe.  She has some tenderness palpation of the first MTP joint.  She has some bruising in this area.  Tenderness to palpation at the IP joint.  She is able to dorsiflex the ankle, as well as the great toe.  Some limitation with extension of the great toe.  She is concerned that she is unable to abduct her  toes the same way that she can on the contralateral side.  Sensation is intact proximal to the injury.  She does have some decrease sensation to the great toe, as well as the lesser toes which she states is her baseline.  IMAGING: I personally  reviewed images previously obtained from the ED  Possible fracture through an osteophyte at the IP joint, and the medial aspect of the great toe.   New Medications:  No orders of the defined types were placed in this encounter.     Oliver Barre, MD  03/13/2023 1:41 PM

## 2023-03-13 NOTE — ED Provider Notes (Signed)
I saw and evaluated patient in triage.  Patient presents today for severe headache that has been ongoing for the past 3 days.  She reports a history of migraines, but never this severe.  She reports history of tick bite 3 weeks ago for which she started treatment with doxycycline, however was only able to complete 8 days of it.  Reports that she was unable to keep much of the medicine or any food/fluids down while she was taking the doxycycline due to stomach upset  Reports light sensitivity, neck stiffness, and new rash that developed on her abdomen and bilateral arms today.  Reports headache is not relieved with ibuprofen, Tylenol, or aspirin which usually works for her migraines.  Given symptoms and exam, I recommended further evaluation and management emergency room.  Patient is in agreement to plan.  Patient is safe to transport via private vehicle at this time.   Valentino Nose, NP 03/13/23 1630

## 2023-03-13 NOTE — ED Notes (Signed)
Patient is being discharged from the Urgent Care and sent to the Emergency Department via POV . Per NP, patient is in need of higher level of care due to severe headache. Patient is aware and verbalizes understanding of plan of care.  Vitals:   03/13/23 1608  BP: (!) 173/85  Pulse: (!) 107  Resp: 20  Temp: 98.8 F (37.1 C)  SpO2: 99%

## 2023-03-16 ENCOUNTER — Telehealth (INDEPENDENT_AMBULATORY_CARE_PROVIDER_SITE_OTHER): Payer: Medicare Other | Admitting: Psychiatry

## 2023-03-16 ENCOUNTER — Encounter (HOSPITAL_COMMUNITY): Payer: Self-pay | Admitting: Psychiatry

## 2023-03-16 DIAGNOSIS — F411 Generalized anxiety disorder: Secondary | ICD-10-CM

## 2023-03-16 DIAGNOSIS — F901 Attention-deficit hyperactivity disorder, predominantly hyperactive type: Secondary | ICD-10-CM | POA: Diagnosis not present

## 2023-03-16 DIAGNOSIS — F332 Major depressive disorder, recurrent severe without psychotic features: Secondary | ICD-10-CM

## 2023-03-16 DIAGNOSIS — F4310 Post-traumatic stress disorder, unspecified: Secondary | ICD-10-CM

## 2023-03-16 MED ORDER — CARBAMAZEPINE 200 MG PO TABS: 200.0000 mg | ORAL_TABLET | Freq: Two times a day (BID) | ORAL | 2 refills | Status: DC

## 2023-03-16 MED ORDER — AMPHETAMINE-DEXTROAMPHETAMINE 20 MG PO TABS: 20.0000 mg | ORAL_TABLET | Freq: Three times a day (TID) | ORAL | 0 refills | Status: DC

## 2023-03-16 MED ORDER — CITALOPRAM HYDROBROMIDE 20 MG PO TABS: 20.0000 mg | ORAL_TABLET | Freq: Two times a day (BID) | ORAL | 2 refills | Status: DC

## 2023-03-16 MED ORDER — ARIPIPRAZOLE 2 MG PO TABS: 2.0000 mg | ORAL_TABLET | Freq: Every day | ORAL | 2 refills | Status: DC

## 2023-03-16 MED ORDER — ALPRAZOLAM 1 MG PO TABS: 1.0000 mg | ORAL_TABLET | Freq: Four times a day (QID) | ORAL | 2 refills | Status: DC

## 2023-03-16 NOTE — Progress Notes (Signed)
Virtual Visit via Video Note  I connected with Destiny Orr on 03/16/23 at 10:20 AM EDT by a video enabled telemedicine application and verified that I am speaking with the correct person using two identifiers.  Location: Patient: home Provider: office   I discussed the limitations of evaluation and management by telemedicine and the availability of in person appointments. The patient expressed understanding and agreed to proceed.     I discussed the assessment and treatment plan with the patient. The patient was provided an opportunity to ask questions and all were answered. The patient agreed with the plan and demonstrated an understanding of the instructions.   The patient was advised to call back or seek an in-person evaluation if the symptoms worsen or if the condition fails to improve as anticipated.  I provided 15 minutes of non-face-to-face time during this encounter.   Diannia Ruder, MD  [facility] MD/PA/NP OP Progress Note  03/16/2023 10:39 AM Destiny Orr  MRN:  [id number]  Chief Complaint:  Chief Complaint  Patient presents with   Anxiety   Depression   ADD   HPI:  This patient is a 42 year old separated white female who lives with her ex husband 1 son and 1 daughter in [place].  She was on disability from [employer] but now get Social Security disability   The patient returns for follow-up after 2 months.  This is regarding her depression and anxiety and ADD.  She states that as usual things are up and down at her house.  Her daughter and the baby have moved out that the daughter's boyfriend and his daughter are still staying at her house.  This means that she does not have her own bedroom.  She is not sleeping all that well.  This is also often because her daughter calls her late at night or early morning to ask questions about the baby.  The patient admits she has a hard time saying no to her family and gets herself into financial and emotional [place].  We have talked  about this numerous times in the past.  For the most part her mood has been stable and she is focusing well.  Her general level of anxiety is higher lately but for the most part she thinks the medications have controlled it. Visit Diagnosis:    ICD-10-CM   1. Major depressive disorder, recurrent, severe without psychotic features (HCC)  F33.2     2. GAD (generalized anxiety disorder)  F41.1     3. Attention deficit hyperactivity disorder (ADHD), predominantly hyperactive type  F90.1     4. PTSD (post-traumatic stress disorder)  F43.10       Past Psychiatric History: Long-term outpatient treatment for depression and anxiety  Past Medical History:  Past Medical History:  Diagnosis Date   Abnormal Papanicolaou smear of cervix with positive human papilloma virus (HPV) test 10/11/2018   LSIL +HPV, get colpo___   Abnormal uterine bleeding (AUB) 12/09/2014   Anxiety    Arthritis    Depression    DJD (degenerative joint disease)    Dumping syndrome    Dysmenorrhea 07/15/2014   Fatigue 12/24/2012   Fibromyalgia diagnosed April 2016   Headache    Hx of migraine headaches 12/24/2012   Inappropriate sinus tachycardia    Irregular menstrual bleeding 07/15/2014   Pelvic pain in female 03/03/2016   Pneumothorax, spontaneous, tension    Post concussion syndrome    POTS (postural orthostatic tachycardia syndrome)    PTSD (post-traumatic stress disorder)  Scoliosis    Thyroid disease    Tick bite 03/03/2016   Vitamin B 12 deficiency     Past Surgical History:  Procedure Laterality Date   bone spurs toes Right    CESAREAN SECTION     COLONOSCOPY     endooscopy     KNEE SURGERY     rotate cuff lt arm      Family Psychiatric History: See below  Family History:  Family History  Problem Relation Age of Onset   Hypertension Father    Atrial fibrillation Father    Alcohol abuse Father    Cancer Maternal Grandmother        skin    Heart disease Maternal Grandmother    Other Maternal  Grandmother        had thyroid removed   Breast cancer Maternal Grandmother    Heart disease Paternal Grandfather    COPD Paternal Grandfather    Hypertension Paternal Grandfather    Diabetes Paternal Grandfather    Stroke Paternal Grandfather    Cancer Maternal Grandfather        bladder,lung   Anxiety disorder Maternal Aunt    Anxiety disorder Maternal Uncle    Bipolar disorder Maternal Uncle    Breast cancer Maternal Uncle        CML   Leukemia Other    Colon cancer Other        lung-2 maternal great uncles    Social History:  Social History   Socioeconomic History   Marital status: Legally Separated    Spouse name: Not on file   Number of children: 3   Years of education: Not on file   Highest education level: Not on file  Occupational History   Not on file  Tobacco Use   Smoking status: Former    Packs/day: 1.00    Years: 15.00    Additional pack years: 0.00    Total pack years: 15.00    Types: Cigarettes    Start date: 03/13/1997    Quit date: 09/25/2021    Years since quitting: 1.4   Smokeless tobacco: Never   Tobacco comments:    "working on it" Was smoking 2.5 packs a day  Vaping Use   Vaping Use: Never used  Substance and Sexual Activity   Alcohol use: No   Drug use: No   Sexual activity: Yes    Partners: Male    Birth control/protection: Pill  Other Topics Concern   Not on file  Social History Narrative   Not on file   Social Determinants of Health   Financial Resource Strain: High Risk (07/03/2022)   Overall Financial Resource Strain (CARDIA)    Difficulty of Paying Living Expenses: Very hard  Food Insecurity: Food Insecurity Present (07/03/2022)   Hunger Vital Sign    Worried About Running Out of Food in the Last Year: Sometimes true    Ran Out of Food in the Last Year: Never true  Transportation Needs: No Transportation Needs (07/03/2022)   PRAPARE - Administrator, Civil Service (Medical): No    Lack of Transportation  (Non-Medical): No  Physical Activity: Inactive (01/07/2021)   Exercise Vital Sign    Days of Exercise per Week: 0 days    Minutes of Exercise per Session: 0 min  Stress: Stress Concern Present (07/03/2022)   [employer] of Occupational Health - Occupational Stress Questionnaire    Feeling of Stress : Very much  Social Connections: Moderately Integrated (07/03/2022)  Social Advertising account executive [NHANES]    Frequency of Communication with Friends and Family: More than three times a week    Frequency of Social Gatherings with Friends and Family: Once a week    Attends Religious Services: 1 to 4 times per year    Active Member of [employer] or Organizations: No    Attends Banker Meetings: Never    Marital Status: Married    Allergies:  Allergies  Allergen Reactions   Prozac [Fluoxetine]     Suicidal thoughts   Topiramate Other (See Comments)    Topamax-dizziness   Lexapro [Escitalopram Oxalate] Other (See Comments)    Flat affect, No emotions    Metabolic Disorder Labs: Lab Results  Component Value Date   HGBA1C 5.4 01/07/2013   MPG 108 01/07/2013   Lab Results  Component Value Date   PROLACTIN 13.2 06/12/2016   Lab Results  Component Value Date   CHOL 204 (H) 01/07/2021   TRIG 132 01/07/2021   HDL 72 01/07/2021   CHOLHDL 2.8 01/07/2021   VLDL 31 01/07/2013   LDLCALC 109 (H) 01/07/2021   LDLCALC 80 10/08/2018   Lab Results  Component Value Date   TSH 4.080 01/07/2021   TSH 1.971 10/24/2019    Therapeutic Level Labs: Lab Results  Component Value Date   LITHIUM 0.45 (L) 01/27/2020   LITHIUM 0.24 (L) 09/03/2019   No results found for: "VALPROATE" No results found for: "CBMZ"  Current Medications: Current Outpatient Medications  Medication Sig Dispense Refill   ALPRAZolam (XANAX) 1 MG tablet Take 1 tablet (1 mg total) by mouth 4 (four) times daily. 120 tablet 2   amphetamine-dextroamphetamine (ADDERALL) 20 MG tablet Take 1 tablet (20  mg total) by mouth 3 (three) times daily. Dx code F90.0 ADD 90 tablet 0   amphetamine-dextroamphetamine (ADDERALL) 20 MG tablet Take 1 tablet (20 mg total) by mouth 3 (three) times daily. 90 tablet 0   ARIPiprazole (ABILIFY) 2 MG tablet Take 1 tablet (2 mg total) by mouth daily. 30 tablet 2   carbamazepine (TEGRETOL) 200 MG tablet Take 1 tablet (200 mg total) by mouth 2 (two) times daily. 60 tablet 2   citalopram (CELEXA) 20 MG tablet Take 1 tablet (20 mg total) by mouth 2 (two) times daily. 60 tablet 2   doxycycline (VIBRA-TABS) 100 MG tablet Take 1 tablet (100 mg total) by mouth 2 (two) times daily for 21 days. (Patient not taking: Reported on 03/13/2023) 42 tablet 0   ibuprofen (ADVIL,MOTRIN) 200 MG tablet Take 400-600 mg by mouth every 6 (six) hours as needed for mild pain or moderate pain.     megestrol (MEGACE) 40 MG tablet 3 tablets a day for 5 days, 2 tablets a day for 5 days then 1 tablet daily 45 tablet 3   ondansetron (ZOFRAN-ODT) 4 MG disintegrating tablet Take 1 tablet (4 mg total) by mouth every 8 (eight) hours as needed. 20 tablet 1   Current Facility-Administered Medications  Medication Dose Route Frequency Provider Last Rate Last Admin   ipratropium (ATROVENT) 0.03 % nasal spray 2 spray  2 spray Each Nare TID Ward, Tylene Fantasia, PA-C         Musculoskeletal: Strength & Muscle Tone: within normal limits Gait & Station: normal Patient leans: N/A  Psychiatric Specialty Exam: Review of Systems  Musculoskeletal:  Positive for arthralgias.  Neurological:  Positive for headaches.    Last menstrual period 02/18/2023.There is no height or weight on file to calculate BMI.  General Appearance: Casual and Fairly Groomed  Eye Contact:  Good  Speech:  Clear and Coherent  Volume:  Normal  Mood:  Dysphoric  Affect:  Congruent  Thought Process:  Goal Directed  Orientation:  Full (Time, Place, and Person)  Thought Content: Rumination   Suicidal Thoughts:  No  Homicidal Thoughts:  No   Memory:  Immediate;   Good Recent;   Good Remote;   Good  Judgement:  Fair  Insight:  Fair  Psychomotor Activity:  Decreased  Concentration:  Concentration: Good and Attention Span: Good  Recall:  Good  Fund of Knowledge: Good  Language: Good  Akathisia:  No  Handed:  Right  AIMS (if indicated): not done  Assets:  Communication Skills Desire for Improvement Resilience Social Support Talents/Skills  ADL's:  Intact  Cognition: WNL  Sleep:  Fair   Screenings: GAD-7    Garment/textile technologist Visit from 07/03/2022 in [facility] for [employer] at [facility] Counselor from 05/31/2021 in [facility] at [place] Office Visit from 01/07/2021 in [facility] at [facility]  Total GAD-7 Score 21 20 16       Mini-Mental    Flowsheet Row Office Visit from 10/25/2016 in [facility]  Total Score (max 30 points ) 29      PHQ2-9    Flowsheet Row Office Visit from 07/03/2022 in [facility] Video Visit from 06/29/2022 in [facility] [place] Video Visit from 04/11/2022 in [facility] at [place] Video Visit from 03/03/2022 in [facility] at [place] Video Visit from 02/03/2022 in [facility] at [facility] Total Score 6 1 2 5 6   PHQ-9 Total Score 21 -- 6 13 12       Flowsheet Row ED from 03/13/2023 in [facility] at [facility] ED from 03/10/2023 in [facility] Emergency Department at [facility] ED from 12/12/2022 in [facility] Emergency Department at [facility]  C-SSRS RISK CATEGORY No Risk No Risk No Risk        Assessment and Plan: This patient is a 42 year old female with a history of depression, dysthymia, chronic fatigue social anxiety ADD and borderline personality traits.  She is still full of  complaints regarding her life but is starting to realize that she has the ability to take charge of some of these just things that are bothering her.  For now she will continue Celexa 20 mg daily as well as Xanax 1 mg 4 times daily for anxiety and depression, Tegretol 200 mg daily for mood stabilization, Abilify 2 mg daily for augmentation and Adderall 20 mg 3 times daily for ADD.  She will return to see me in 6 weeks  Collaboration of Care: Collaboration of Care: Referral or follow-up with counselor/therapist AEB patient will follow-up with therapist Suzan Garibaldi in our office  Patient/Guardian was advised Release of Information must be obtained prior to any record release in order to collaborate their care with an outside provider. Patient/Guardian was advised if they have not already done so to contact the registration department to sign all necessary forms in order for [place] to release information regarding their care.   Consent: Patient/Guardian gives verbal consent for treatment and assignment of benefits for services provided during this visit. Patient/Guardian expressed understanding and agreed to proceed.    Diannia Ruder, MD  03/16/2023, 10:39 AM

## 2023-04-03 ENCOUNTER — Ambulatory Visit: Payer: Medicare Other | Admitting: Orthopedic Surgery

## 2023-04-27 ENCOUNTER — Encounter (HOSPITAL_COMMUNITY): Payer: Self-pay | Admitting: Psychiatry

## 2023-04-27 ENCOUNTER — Telehealth (INDEPENDENT_AMBULATORY_CARE_PROVIDER_SITE_OTHER): Payer: Medicare Other | Admitting: Psychiatry

## 2023-04-27 DIAGNOSIS — F901 Attention-deficit hyperactivity disorder, predominantly hyperactive type: Secondary | ICD-10-CM | POA: Diagnosis not present

## 2023-04-27 DIAGNOSIS — F419 Anxiety disorder, unspecified: Secondary | ICD-10-CM | POA: Diagnosis not present

## 2023-04-27 DIAGNOSIS — F32A Depression, unspecified: Secondary | ICD-10-CM

## 2023-04-27 DIAGNOSIS — F332 Major depressive disorder, recurrent severe without psychotic features: Secondary | ICD-10-CM

## 2023-04-27 DIAGNOSIS — F411 Generalized anxiety disorder: Secondary | ICD-10-CM

## 2023-04-27 MED ORDER — TEMAZEPAM 15 MG PO CAPS: 15.0000 mg | ORAL_CAPSULE | Freq: Every evening | ORAL | 2 refills | Status: DC | PRN

## 2023-04-27 MED ORDER — CITALOPRAM HYDROBROMIDE 20 MG PO TABS: 20.0000 mg | ORAL_TABLET | Freq: Two times a day (BID) | ORAL | 2 refills | Status: DC

## 2023-04-27 MED ORDER — ARIPIPRAZOLE 2 MG PO TABS: 2.0000 mg | ORAL_TABLET | Freq: Every day | ORAL | 2 refills | Status: DC

## 2023-04-27 MED ORDER — CARBAMAZEPINE 200 MG PO TABS: 200.0000 mg | ORAL_TABLET | Freq: Two times a day (BID) | ORAL | 2 refills | Status: DC

## 2023-04-27 MED ORDER — AMPHETAMINE-DEXTROAMPHETAMINE 20 MG PO TABS: 20.0000 mg | ORAL_TABLET | Freq: Three times a day (TID) | ORAL | 0 refills | Status: DC

## 2023-04-27 MED ORDER — CLONAZEPAM 1 MG PO TABS: 1.0000 mg | ORAL_TABLET | Freq: Four times a day (QID) | ORAL | 2 refills | Status: DC

## 2023-04-27 NOTE — Progress Notes (Signed)
Virtual Visit via Video Note  I connected with Destiny Orr on 04/27/23 at  9:40 AM EDT by a video enabled telemedicine application and verified that I am speaking with the correct person using two identifiers.  Location: Patient: home Provider: office   I discussed the limitations of evaluation and management by telemedicine and the availability of in person appointments. The patient expressed understanding and agreed to proceed.     I discussed the assessment and treatment plan with the patient. The patient was provided an opportunity to ask questions and all were answered. The patient agreed with the plan and demonstrated an understanding of the instructions.   The patient was advised to call back or seek an in-person evaluation if the symptoms worsen or if the condition fails to improve as anticipated.  I provided 20 minutes of non-face-to-face time during this encounter.   Diannia Ruder, MD  [facility] MD/PA/NP OP Progress Note  04/27/2023 10:09 AM Destiny Orr  MRN:  [id number]  Chief Complaint:  Chief Complaint  Patient presents with   ADD   Anxiety   Depression   Follow-up   HPI: Patient is a 42 year old separated white female lives with her ex-husband and 1 son and 1 daughter in [place].  She is on disability.  The patient returns for follow-up after about 6 weeks regarding her depression and anxiety and ADHD.  She states that she is "exhausted."  She states that she is tired of doing everything with no adults helping her.  Her daughter who has the baby is constantly texting her for advice and help.  Apparently the daughter and the father of the baby have now separated.  The patient states that she is the only 1 in the household doing anything around the house.  Her ex-husband "does nothing but play video games.  She states that even when she tries to rest her mind will not shut off and she has not been able to sleep or eat.  She is not sure why things are worse now.  She does not  think the Xanax is working anymore and would like to try something else like clonazepam.  She has tried trazodone for sleep but it made her groggy but she is willing to try something else so we will try Restoril.  She denies any thoughts of suicide or self-harm. Visit Diagnosis:    ICD-10-CM   1. Major depressive disorder, recurrent, severe without psychotic features (HCC)  F33.2     2. GAD (generalized anxiety disorder)  F41.1     3. Attention deficit hyperactivity disorder (ADHD), predominantly hyperactive type  F90.1       Past Psychiatric History: Long-term outpatient treatment for depression and anxiety  Past Medical History:  Past Medical History:  Diagnosis Date   Abnormal Papanicolaou smear of cervix with positive human papilloma virus (HPV) test 10/11/2018   LSIL +HPV, get colpo___   Abnormal uterine bleeding (AUB) 12/09/2014   Anxiety    Arthritis    Depression    DJD (degenerative joint disease)    Dumping syndrome    Dysmenorrhea 07/15/2014   Fatigue 12/24/2012   Fibromyalgia diagnosed April 2016   Headache    Hx of migraine headaches 12/24/2012   Inappropriate sinus tachycardia    Irregular menstrual bleeding 07/15/2014   Pelvic pain in female 03/03/2016   Pneumothorax, spontaneous, tension    Post concussion syndrome    POTS (postural orthostatic tachycardia syndrome)    PTSD (post-traumatic stress disorder)  Scoliosis    Thyroid disease    Tick bite 03/03/2016   Vitamin B 12 deficiency     Past Surgical History:  Procedure Laterality Date   bone spurs toes Right    CESAREAN SECTION     COLONOSCOPY     endooscopy     KNEE SURGERY     rotate cuff lt arm      Family Psychiatric History: See below  Family History:  Family History  Problem Relation Age of Onset   Hypertension Father    Atrial fibrillation Father    Alcohol abuse Father    Cancer Maternal Grandmother        skin    Heart disease Maternal Grandmother    Other Maternal Grandmother         had thyroid removed   Breast cancer Maternal Grandmother    Heart disease Paternal Grandfather    COPD Paternal Grandfather    Hypertension Paternal Grandfather    Diabetes Paternal Grandfather    Stroke Paternal Grandfather    Cancer Maternal Grandfather        bladder,lung   Anxiety disorder Maternal Aunt    Anxiety disorder Maternal Uncle    Bipolar disorder Maternal Uncle    Breast cancer Maternal Uncle        CML   Leukemia Other    Colon cancer Other        lung-2 maternal great uncles    Social History:  Social History   Socioeconomic History   Marital status: Legally Separated    Spouse name: Not on file   Number of children: 3   Years of education: Not on file   Highest education level: Not on file  Occupational History   Not on file  Tobacco Use   Smoking status: Former    Current packs/day: 0.00    Average packs/day: 1 pack/day for 24.5 years (24.5 ttl pk-yrs)    Types: Cigarettes    Start date: 03/13/1997    Quit date: 09/25/2021    Years since quitting: 1.5   Smokeless tobacco: Never   Tobacco comments:    "working on it" Was smoking 2.5 packs a day  Vaping Use   Vaping status: Never Used  Substance and Sexual Activity   Alcohol use: No   Drug use: No   Sexual activity: Yes    Partners: Male    Birth control/protection: Pill  Other Topics Concern   Not on file  Social History Narrative   Not on file   Social Determinants of Health   Financial Resource Strain: High Risk (07/03/2022)   Overall Financial Resource Strain (CARDIA)    Difficulty of Paying Living Expenses: Very hard  Food Insecurity: Food Insecurity Present (07/03/2022)   Hunger Vital Sign    Worried About Running Out of Food in the Last Year: Sometimes true    Ran Out of Food in the Last Year: Never true  Transportation Needs: No Transportation Needs (07/03/2022)   PRAPARE - Administrator, Civil Service (Medical): No    Lack of Transportation (Non-Medical): No   Physical Activity: Inactive (01/07/2021)   Exercise Vital Sign    Days of Exercise per Week: 0 days    Minutes of Exercise per Session: 0 min  Stress: Stress Concern Present (07/03/2022)   [employer] of Occupational Health - Occupational Stress Questionnaire    Feeling of Stress : Very much  Social Connections: Moderately Integrated (07/03/2022)   Social Connection and  Isolation Panel [NHANES]    Frequency of Communication with Friends and Family: More than three times a week    Frequency of Social Gatherings with Friends and Family: Once a week    Attends Religious Services: 1 to 4 times per year    Active Member of [employer] or Organizations: No    Attends Banker Meetings: Never    Marital Status: Married    Allergies:  Allergies  Allergen Reactions   Prozac [Fluoxetine]     Suicidal thoughts   Topiramate Other (See Comments)    Topamax-dizziness   Lexapro [Escitalopram Oxalate] Other (See Comments)    Flat affect, No emotions    Metabolic Disorder Labs: Lab Results  Component Value Date   HGBA1C 5.4 01/07/2013   MPG 108 01/07/2013   Lab Results  Component Value Date   PROLACTIN 13.2 06/12/2016   Lab Results  Component Value Date   CHOL 204 (H) 01/07/2021   TRIG 132 01/07/2021   HDL 72 01/07/2021   CHOLHDL 2.8 01/07/2021   VLDL 31 01/07/2013   LDLCALC 109 (H) 01/07/2021   LDLCALC 80 10/08/2018   Lab Results  Component Value Date   TSH 4.080 01/07/2021   TSH 1.971 10/24/2019    Therapeutic Level Labs: Lab Results  Component Value Date   LITHIUM 0.45 (L) 01/27/2020   LITHIUM 0.24 (L) 09/03/2019   No results found for: "VALPROATE" No results found for: "CBMZ"  Current Medications: Current Outpatient Medications  Medication Sig Dispense Refill   clonazePAM (KLONOPIN) 1 MG tablet Take 1 tablet (1 mg total) by mouth 4 (four) times daily. 120 tablet 2   temazepam (RESTORIL) 15 MG capsule Take 1 capsule (15 mg total) by mouth at bedtime as  needed for sleep. 30 capsule 2   amphetamine-dextroamphetamine (ADDERALL) 20 MG tablet Take 1 tablet (20 mg total) by mouth 3 (three) times daily. 90 tablet 0   amphetamine-dextroamphetamine (ADDERALL) 20 MG tablet Take 1 tablet (20 mg total) by mouth 3 (three) times daily. Dx code F90.0 ADD 90 tablet 0   ARIPiprazole (ABILIFY) 2 MG tablet Take 1 tablet (2 mg total) by mouth daily. 30 tablet 2   carbamazepine (TEGRETOL) 200 MG tablet Take 1 tablet (200 mg total) by mouth 2 (two) times daily. 60 tablet 2   citalopram (CELEXA) 20 MG tablet Take 1 tablet (20 mg total) by mouth 2 (two) times daily. 60 tablet 2   ibuprofen (ADVIL,MOTRIN) 200 MG tablet Take 400-600 mg by mouth every 6 (six) hours as needed for mild pain or moderate pain.     megestrol (MEGACE) 40 MG tablet 3 tablets a day for 5 days, 2 tablets a day for 5 days then 1 tablet daily 45 tablet 3   ondansetron (ZOFRAN-ODT) 4 MG disintegrating tablet Take 1 tablet (4 mg total) by mouth every 8 (eight) hours as needed. 20 tablet 1   Current Facility-Administered Medications  Medication Dose Route Frequency Provider Last Rate Last Admin   ipratropium (ATROVENT) 0.03 % nasal spray 2 spray  2 spray Each Nare TID Ward, Tylene Fantasia, PA-C         Musculoskeletal: Strength & Muscle Tone: within normal limits Gait & Station: normal Patient leans: N/A  Psychiatric Specialty Exam: Review of Systems  Neurological:  Positive for headaches.  Psychiatric/Behavioral:  Positive for dysphoric mood and sleep disturbance. The patient is nervous/anxious.   All other systems reviewed and are negative.   There were no vitals taken for this visit.There  is no height or weight on file to calculate BMI.  General Appearance: Casual and Fairly Groomed  Eye Contact:  Good  Speech:  Clear and Coherent  Volume:  Normal  Mood:  Anxious and Depressed  Affect:  Depressed and Tearful  Thought Process:  Goal Directed  Orientation:  Full (Time, Place, and Person)   Thought Content: Rumination   Suicidal Thoughts:  No  Homicidal Thoughts:  No  Memory:  Immediate;   Good Recent;   Good Remote;   NA  Judgement:  Fair  Insight:  Fair  Psychomotor Activity:  Decreased  Concentration:  Concentration: Good and Attention Span: Good  Recall:  Good  Fund of Knowledge: Good  Language: Good  Akathisia:  No  Handed:  Right  AIMS (if indicated): not done  Assets:  Communication Skills Desire for Improvement Physical Health Resilience Social Support Talents/Skills  ADL's:  Intact  Cognition: WNL  Sleep:  Poor   Screenings: GAD-7    Flowsheet Row Office Visit from 07/03/2022 in [facility] for [facility] Counselor from 05/31/2021 in [facility] at [place] Office Visit from 01/07/2021 in [facility]  Total GAD-7 Score 21 20 16       Mini-Mental    Flowsheet Row Office Visit from 10/25/2016 in [facility]  Total Score (max 30 points ) 29      PHQ2-9    Flowsheet Row Office Visit from 07/03/2022 in [facility] at [facility] Video Visit from 06/29/2022 in [facility] [place] Video Visit from 04/11/2022 in [facility] [place] Video Visit from 03/03/2022 in [facility] at [place] Video Visit from 02/03/2022 in [facility] at [facility] Total Score 6 1 2 5 6   PHQ-9 Total Score 21 -- 6 13 12       Flowsheet Row ED from 03/13/2023 in [facility] at [facility] ED from 03/10/2023 in [facility] Department at [facility] ED from 12/12/2022 in [facility] Department at [facility]  C-SSRS RISK CATEGORY No Risk No Risk No Risk        Assessment and Plan: This patient is a 42 year old female with a history of depression dysthymia  chronic fatigue social anxiety ADD and borderline personality traits.  Again she has let herself get overwhelmed and take on responsibility for everything without asking for help.  She is extremely anxious.  She does not think the Xanax is helping so we will switch to clonazepam 1 mg 4 times daily and also add Restoril 15 mg at bedtime for sleep.  She will continue Celexa 20 mg daily for depression, Tegretol 200 mg daily for mood stabilization, Abilify 2 mg daily for augmentation of the antidepressant and Adderall 20 mg 3 times daily for ADD.  She will return to see me in 4 weeks  Collaboration of Care: Collaboration of Care: Referral or follow-up with counselor/therapist AEB patient is supposed to be following up with Suzan Garibaldi in our office  Patient/Guardian was advised Release of Information must be obtained prior to any record release in order to collaborate their care with an outside provider. Patient/Guardian was advised if they have not already done so to contact the registration department to sign all necessary forms in order for [place] to release information regarding their care.  Consent: Patient/Guardian gives verbal consent for treatment and assignment of benefits for services provided during this visit. Patient/Guardian expressed understanding and agreed to proceed.    Diannia Ruder, MD 04/27/2023, 10:09 AM

## 2023-05-07 ENCOUNTER — Telehealth (HOSPITAL_COMMUNITY): Payer: Self-pay

## 2023-05-07 ENCOUNTER — Encounter (HOSPITAL_COMMUNITY): Payer: Self-pay

## 2023-05-07 NOTE — Telephone Encounter (Signed)
Spoke with pt advised of Dr Charlott Rakes message, pt states that the pharmacy may not fill it. I contacted [facility] pharmacy and told them what was going on about pt switching back to her xanax for now and they said that pt does have 1 refill left on xanax but they can not fill until Wednesday 05/09/23. I told [place] that pharmacy will fill on 05/09/23, she stated that she may not pick it up.

## 2023-05-07 NOTE — Telephone Encounter (Signed)
Pt sent in [facility] message, routed to provider

## 2023-05-07 NOTE — Telephone Encounter (Signed)
Pt called in stating that since starting clonazePAM (KLONOPIN) 1 MG tablet on 04/27/23 she has become mean. Pt states that she does not want to deal with anyone, not even her granddaughter who is her pride and joy. Pt has been taking medication up until today and is wanting to know what can be done. She states that she can not just not take any anxiety medication and doesn't want to just stop taking anything. Pt scheduled 05/25/23 and no open appt slots. Please advise.

## 2023-05-23 ENCOUNTER — Telehealth (HOSPITAL_COMMUNITY): Payer: Self-pay | Admitting: Psychiatry

## 2023-05-23 NOTE — Telephone Encounter (Signed)
Called and confirmed patient will be attending appointment with Dr Tenny Craw on 08/23.

## 2023-05-25 ENCOUNTER — Telehealth (HOSPITAL_COMMUNITY): Payer: Self-pay | Admitting: *Deleted

## 2023-05-25 ENCOUNTER — Encounter (HOSPITAL_COMMUNITY): Payer: Self-pay

## 2023-05-25 ENCOUNTER — Encounter (HOSPITAL_COMMUNITY): Payer: Self-pay | Admitting: Psychiatry

## 2023-05-25 ENCOUNTER — Telehealth (HOSPITAL_COMMUNITY): Payer: Medicare Other | Admitting: Psychiatry

## 2023-05-25 DIAGNOSIS — F901 Attention-deficit hyperactivity disorder, predominantly hyperactive type: Secondary | ICD-10-CM

## 2023-05-25 DIAGNOSIS — F411 Generalized anxiety disorder: Secondary | ICD-10-CM

## 2023-05-25 DIAGNOSIS — F332 Major depressive disorder, recurrent severe without psychotic features: Secondary | ICD-10-CM

## 2023-05-25 MED ORDER — ARIPIPRAZOLE 2 MG PO TABS: 2.0000 mg | ORAL_TABLET | Freq: Every day | ORAL | 2 refills | Status: DC

## 2023-05-25 MED ORDER — QUETIAPINE FUMARATE 50 MG PO TABS: 50.0000 mg | ORAL_TABLET | Freq: Two times a day (BID) | ORAL | 2 refills | Status: DC

## 2023-05-25 MED ORDER — CARBAMAZEPINE 200 MG PO TABS: 200.0000 mg | ORAL_TABLET | Freq: Two times a day (BID) | ORAL | 2 refills | Status: DC

## 2023-05-25 MED ORDER — ALPRAZOLAM 1 MG PO TABS: 1.0000 mg | ORAL_TABLET | Freq: Four times a day (QID) | ORAL | 2 refills | Status: DC

## 2023-05-25 MED ORDER — AMPHETAMINE-DEXTROAMPHETAMINE 20 MG PO TABS: 20.0000 mg | ORAL_TABLET | Freq: Three times a day (TID) | ORAL | 0 refills | Status: DC

## 2023-05-25 MED ORDER — FLUOXETINE HCL 40 MG PO CAPS: 40.0000 mg | ORAL_CAPSULE | Freq: Every day | ORAL | 2 refills | Status: DC

## 2023-05-25 NOTE — Progress Notes (Signed)
Virtual Visit via Video Note  I connected with Destiny Orr on 05/25/23 at 10:20 AM EDT by a video enabled telemedicine application and verified that I am speaking with the correct person using two identifiers.  Location: Patient: home Provider: home office   I discussed the limitations of evaluation and management by telemedicine and the availability of in person appointments. The patient expressed understanding and agreed to proceed.     I discussed the assessment and treatment plan with the patient. The patient was provided an opportunity to ask questions and all were answered. The patient agreed with the plan and demonstrated an understanding of the instructions.   The patient was advised to call back or seek an in-person evaluation if the symptoms worsen or if the condition fails to improve as anticipated.  I provided 20 minutes of non-face-to-face time during this encounter.   Diannia Ruder, MD  [facility] MD/PA/NP OP Progress Note  05/25/2023 10:35 AM Destiny Orr  MRN:  [id number]  Chief Complaint:  Chief Complaint  Patient presents with   Depression   Anxiety   Follow-up   HPI: This patient is a 42 year old separated white female who lives with her ex-husband and 1 son and 1 daughter in [place].  She is on disability.  The patient returns for follow-up after 4 weeks regarding her depression anxiety and ADHD.  She claims that she is "more depressed than ever."  She finds that hardly anything gives her joy anymore.  She is not sleeping well at night.  She is very discouraged about her life right now.  She does not feel suicidal or have any thoughts of self-harm but she "just wants to feel better."  Again we reviewed her records and she has tried numerous antidepressants.  She wants to try ketamine if her insurance will pay for it and she is going to check with the clinic in [place].  I also suggested GeneSight testing and she is agreeable.  For now we will add Seroquel at bedtime instead  of temazepam to help with her sleep and perhaps augment the antidepressant we will change Celexa to Prozac as it is a bit more activating and right now she has no energy.  She states that she could not continue the therapy because her sure insurance was not paying for Visit Diagnosis:    ICD-10-CM   1. Major depressive disorder, recurrent, severe without psychotic features (HCC)  F33.2     2. GAD (generalized anxiety disorder)  F41.1     3. Attention deficit hyperactivity disorder (ADHD), predominantly hyperactive type  F90.1       Past Psychiatric History: Long-term outpatient treatment for depression and anxiety  Past Medical History:  Past Medical History:  Diagnosis Date   Abnormal Papanicolaou smear of cervix with positive human papilloma virus (HPV) test 10/11/2018   LSIL +HPV, get colpo___   Abnormal uterine bleeding (AUB) 12/09/2014   Anxiety    Arthritis    Depression    DJD (degenerative joint disease)    Dumping syndrome    Dysmenorrhea 07/15/2014   Fatigue 12/24/2012   Fibromyalgia diagnosed April 2016   Headache    Hx of migraine headaches 12/24/2012   Inappropriate sinus tachycardia    Irregular menstrual bleeding 07/15/2014   Pelvic pain in female 03/03/2016   Pneumothorax, spontaneous, tension    Post concussion syndrome    POTS (postural orthostatic tachycardia syndrome)    PTSD (post-traumatic stress disorder)    Scoliosis    Thyroid disease  Tick bite 03/03/2016   Vitamin B 12 deficiency     Past Surgical History:  Procedure Laterality Date   bone spurs toes Right    CESAREAN SECTION     COLONOSCOPY     endooscopy     KNEE SURGERY     rotate cuff lt arm      Family Psychiatric History: See below  Family History:  Family History  Problem Relation Age of Onset   Hypertension Father    Atrial fibrillation Father    Alcohol abuse Father    Cancer Maternal Grandmother        skin    Heart disease Maternal Grandmother    Other Maternal Grandmother         had thyroid removed   Breast cancer Maternal Grandmother    Heart disease Paternal Grandfather    COPD Paternal Grandfather    Hypertension Paternal Grandfather    Diabetes Paternal Grandfather    Stroke Paternal Grandfather    Cancer Maternal Grandfather        bladder,lung   Anxiety disorder Maternal Aunt    Anxiety disorder Maternal Uncle    Bipolar disorder Maternal Uncle    Breast cancer Maternal Uncle        CML   Leukemia Other    Colon cancer Other        lung-2 maternal great uncles    Social History:  Social History   Socioeconomic History   Marital status: Legally Separated    Spouse name: Not on file   Number of children: 3   Years of education: Not on file   Highest education level: Not on file  Occupational History   Not on file  Tobacco Use   Smoking status: Former    Current packs/day: 0.00    Average packs/day: 1 pack/day for 24.5 years (24.5 ttl pk-yrs)    Types: Cigarettes    Start date: 03/13/1997    Quit date: 09/25/2021    Years since quitting: 1.6   Smokeless tobacco: Never   Tobacco comments:    "working on it" Was smoking 2.5 packs a day  Vaping Use   Vaping status: Never Used  Substance and Sexual Activity   Alcohol use: No   Drug use: No   Sexual activity: Yes    Partners: Male    Birth control/protection: Pill  Other Topics Concern   Not on file  Social History Narrative   Not on file   Social Determinants of Health   Financial Resource Strain: High Risk (07/03/2022)   Overall Financial Resource Strain (CARDIA)    Difficulty of Paying Living Expenses: Very hard  Food Insecurity: Food Insecurity Present (07/03/2022)   Hunger Vital Sign    Worried About Running Out of Food in the Last Year: Sometimes true    Ran Out of Food in the Last Year: Never true  Transportation Needs: No Transportation Needs (07/03/2022)   PRAPARE - Administrator, Civil Service (Medical): No    Lack of Transportation (Non-Medical): No   Physical Activity: Inactive (01/07/2021)   Exercise Vital Sign    Days of Exercise per Week: 0 days    Minutes of Exercise per Session: 0 min  Stress: Stress Concern Present (07/03/2022)   [employer] of Occupational Health - Occupational Stress Questionnaire    Feeling of Stress : Very much  Social Connections: Moderately Integrated (07/03/2022)   Social Connection and Isolation Panel [NHANES]    Frequency of Communication  with Friends and Family: More than three times a week    Frequency of Social Gatherings with Friends and Family: Once a week    Attends Religious Services: 1 to 4 times per year    Active Member of [employer] or Organizations: No    Attends Banker Meetings: Never    Marital Status: Married    Allergies:  Allergies  Allergen Reactions   Prozac [Fluoxetine]     Suicidal thoughts   Topiramate Other (See Comments)    Topamax-dizziness   Lexapro [Escitalopram Oxalate] Other (See Comments)    Flat affect, No emotions    Metabolic Disorder Labs: Lab Results  Component Value Date   HGBA1C 5.4 01/07/2013   MPG 108 01/07/2013   Lab Results  Component Value Date   PROLACTIN 13.2 06/12/2016   Lab Results  Component Value Date   CHOL 204 (H) 01/07/2021   TRIG 132 01/07/2021   HDL 72 01/07/2021   CHOLHDL 2.8 01/07/2021   VLDL 31 01/07/2013   LDLCALC 109 (H) 01/07/2021   LDLCALC 80 10/08/2018   Lab Results  Component Value Date   TSH 4.080 01/07/2021   TSH 1.971 10/24/2019    Therapeutic Level Labs: Lab Results  Component Value Date   LITHIUM 0.45 (L) 01/27/2020   LITHIUM 0.24 (L) 09/03/2019   No results found for: "VALPROATE" No results found for: "CBMZ"  Current Medications: Current Outpatient Medications  Medication Sig Dispense Refill   FLUoxetine (PROZAC) 40 MG capsule Take 1 capsule (40 mg total) by mouth daily. 30 capsule 2   QUEtiapine (SEROQUEL) 50 MG tablet Take 1 tablet (50 mg total) by mouth 2 (two) times daily. 30  tablet 2   ALPRAZolam (XANAX) 1 MG tablet Take 1 tablet (1 mg total) by mouth 4 (four) times daily. 120 tablet 2   amphetamine-dextroamphetamine (ADDERALL) 20 MG tablet Take 1 tablet (20 mg total) by mouth 3 (three) times daily. 90 tablet 0   amphetamine-dextroamphetamine (ADDERALL) 20 MG tablet Take 1 tablet (20 mg total) by mouth 3 (three) times daily. Dx code F90.0 ADD 90 tablet 0   ARIPiprazole (ABILIFY) 2 MG tablet Take 1 tablet (2 mg total) by mouth daily. 30 tablet 2   carbamazepine (TEGRETOL) 200 MG tablet Take 1 tablet (200 mg total) by mouth 2 (two) times daily. 60 tablet 2   citalopram (CELEXA) 20 MG tablet Take 1 tablet (20 mg total) by mouth 2 (two) times daily. 60 tablet 2   ibuprofen (ADVIL,MOTRIN) 200 MG tablet Take 400-600 mg by mouth every 6 (six) hours as needed for mild pain or moderate pain.     megestrol (MEGACE) 40 MG tablet 3 tablets a day for 5 days, 2 tablets a day for 5 days then 1 tablet daily 45 tablet 3   ondansetron (ZOFRAN-ODT) 4 MG disintegrating tablet Take 1 tablet (4 mg total) by mouth every 8 (eight) hours as needed. 20 tablet 1   Current Facility-Administered Medications  Medication Dose Route Frequency Provider Last Rate Last Admin   ipratropium (ATROVENT) 0.03 % nasal spray 2 spray  2 spray Each Nare TID Ward, Tylene Fantasia, PA-C         Musculoskeletal: Strength & Muscle Tone: within normal limits Gait & Station: normal Patient leans: N/A  Psychiatric Specialty Exam: Review of Systems  Psychiatric/Behavioral:  Positive for dysphoric mood and sleep disturbance.   All other systems reviewed and are negative.   There were no vitals taken for this visit.There is no  height or weight on file to calculate BMI.  General Appearance: Casual and Fairly Groomed  Eye Contact:  Good  Speech:  Clear and Coherent  Volume:  Normal  Mood:  Depressed and Hopeless  Affect:  Depressed and Tearful  Thought Process:  Goal Directed  Orientation:  Full (Time, Place, and  Person)  Thought Content: Rumination   Suicidal Thoughts:  No  Homicidal Thoughts:  No  Memory:  Immediate;   Good Recent;   Good Remote;   Fair  Judgement:  Good  Insight:  Fair  Psychomotor Activity:  Decreased  Concentration:  Concentration: Fair and Attention Span: Fair  Recall:  Good  Fund of Knowledge: Good  Language: Good  Akathisia:  No  Handed:  Right  AIMS (if indicated): not done  Assets:  Communication Skills Desire for Improvement Resilience Social Support  ADL's:  Intact  Cognition: WNL  Sleep:  Poor   Screenings: GAD-7    Flowsheet Row Office Visit from 07/03/2022 in [facility] for [facility] at [facility] Counselor from 05/31/2021 in [facility] Office Visit from 01/07/2021 in [facility] at [facility]  Total GAD-7 Score 21 20 16       Mini-Mental    Flowsheet Row Office Visit from 10/25/2016 in [facility]  Total Score (max 30 points ) 29      PHQ2-9    Flowsheet Row Office Visit from 07/03/2022 in [facility] for [facility] at [facility] Video Visit from 06/29/2022 in [facility] Video Visit from 04/11/2022 in [facility] at [place] Video Visit from 03/03/2022 in [facility] at [facility] Video Visit from 02/03/2022 in [facility] at [facility] Total Score 6 1 2 5 6   PHQ-9 Total Score 21 -- 6 13 12       Flowsheet Row ED from 03/13/2023 in [facility] at [facility] ED from 03[facility]10/2023 in [facility] Emergency Department at [facility] ED from 12/12/2022 in [facility] Emergency Department at [facility]  C-SSRS RISK CATEGORY No Risk No Risk No Risk        Assessment and Plan: This patient is a 42 year old female with a history of depression dysthymia chronic fatigue social  anxiety ADD and borderline personality traits.  She feels more depressed than ever so we will discontinue Celexa in favor of Prozac 40 mg daily.  She will also start Seroquel 50 mg at bedtime for sleep and mood stabilization.  She will continue Tegretol 200 mg daily for mood stabilization, Abilify 2 mg daily for augmentation and Adderall 20 mg 3 times daily for ADD.  She will look into the ketamine treatment to see if she needs a referral and we will also do GeneSight testing this week.  She will return to see me in 4 weeks  Collaboration of Care: Collaboration of Care: Primary Care Provider AEB notes will be shared with PCP at patient's request  Patient/Guardian was advised Release of Information must be obtained prior to any record release in order to collaborate their care with an outside provider. Patient/Guardian was advised if they have not already done so to contact the registration department to sign all necessary forms in order for [place] to release information regarding their care.   Consent: Patient/Guardian gives verbal consent for treatment and assignment of benefits for services provided during  this visit. Patient/Guardian expressed understanding and agreed to proceed.    Diannia Ruder, MD 05/25/2023, 10:35 AM

## 2023-05-25 NOTE — Telephone Encounter (Signed)
Patient called stating that she would need a referral to [facility] due to insurance. Per pt she and provider talked it today

## 2023-05-28 ENCOUNTER — Other Ambulatory Visit (HOSPITAL_COMMUNITY): Payer: Self-pay | Admitting: Psychiatry

## 2023-05-28 NOTE — Telephone Encounter (Signed)
done

## 2023-05-28 NOTE — Telephone Encounter (Signed)
Form placed in provider's box to complete

## 2023-05-28 NOTE — Telephone Encounter (Signed)
Please find out how to make a referral to [facility] for ketamine. Do they have a referral form?

## 2023-06-08 ENCOUNTER — Encounter (HOSPITAL_COMMUNITY): Payer: Self-pay

## 2023-06-11 NOTE — Telephone Encounter (Signed)
Spoke with patient and she stated that at first [facility] did not want to see her due to past no show and they are charging $300 and she can not afford it. Per pt she would call her insurance company and see which company is in network and will see her and will call office back to let provider know so provider can send new referral to them.

## 2023-06-22 ENCOUNTER — Encounter (HOSPITAL_COMMUNITY): Payer: Self-pay | Admitting: Psychiatry

## 2023-06-22 ENCOUNTER — Telehealth (INDEPENDENT_AMBULATORY_CARE_PROVIDER_SITE_OTHER): Payer: Medicare Other | Admitting: Psychiatry

## 2023-06-22 DIAGNOSIS — F332 Major depressive disorder, recurrent severe without psychotic features: Secondary | ICD-10-CM | POA: Diagnosis not present

## 2023-06-22 DIAGNOSIS — F901 Attention-deficit hyperactivity disorder, predominantly hyperactive type: Secondary | ICD-10-CM

## 2023-06-22 DIAGNOSIS — F411 Generalized anxiety disorder: Secondary | ICD-10-CM | POA: Diagnosis not present

## 2023-06-22 MED ORDER — FLUOXETINE HCL 40 MG PO CAPS: 40.0000 mg | ORAL_CAPSULE | Freq: Every day | ORAL | 2 refills | Status: DC

## 2023-06-22 MED ORDER — ARIPIPRAZOLE 2 MG PO TABS: 2.0000 mg | ORAL_TABLET | Freq: Every day | ORAL | 2 refills | Status: DC

## 2023-06-22 MED ORDER — AMPHETAMINE-DEXTROAMPHETAMINE 20 MG PO TABS: 20.0000 mg | ORAL_TABLET | Freq: Three times a day (TID) | ORAL | 0 refills | Status: DC

## 2023-06-22 MED ORDER — ALPRAZOLAM 1 MG PO TABS: 1.0000 mg | ORAL_TABLET | Freq: Four times a day (QID) | ORAL | 2 refills | Status: DC

## 2023-06-22 MED ORDER — PROPRANOLOL HCL 10 MG PO TABS: 10.0000 mg | ORAL_TABLET | Freq: Three times a day (TID) | ORAL | 2 refills | Status: DC

## 2023-06-22 NOTE — Progress Notes (Signed)
Virtual Visit via Video Note  I connected with Destiny Orr on 06/22/23 at 10:20 AM EDT by a video enabled telemedicine application and verified that I am speaking with the correct person using two identifiers.  Location: Patient: home Provider: office   I discussed the limitations of evaluation and management by telemedicine and the availability of in person appointments. The patient expressed understanding and agreed to proceed.      I discussed the assessment and treatment plan with the patient. The patient was provided an opportunity to ask questions and all were answered. The patient agreed with the plan and demonstrated an understanding of the instructions.   The patient was advised to call back or seek an in-person evaluation if the symptoms worsen or if the condition fails to improve as anticipated.  I provided 30 minutes of non-face-to-face time during this encounter.   Diannia Ruder, MD  [facility] MD/PA/NP OP Progress Note  06/22/2023 10:52 AM Destiny Orr  MRN:  [id number]  Chief Complaint:  Chief Complaint  Patient presents with   Anxiety   Depression   Follow-up   HPI: This patient is a 42 year old separated white female who lives with her ex-husband 1 son and 1 daughter in [place].  Recently her older daughter and baby have moved in.  She is on disability.  The patient returns for follow-up after 4 weeks regarding her depression anxiety and ADHD.  She states that she is not feeling any better in fact worse.  She states that everyone in her family is "using me."  Her older daughter has moved back then and is expecting her to watch the baby all the time.  The patient states if she says no that her own mother gets angry with her.  She feels trapped.  She has nowhere else to go.  She has a very hard time saying no to her daughter or to her mother or to anyone else.  The patient states that she is lost 20 pounds in the last 2 weeks.  Her stomach is upset all the time and she has  diarrhea.  Her blood pressure has been running high.  She found a new PHP but no appointment till the end of October.  I urged her to go to urgent care but she made excuses regarding this.  She denies any thoughts of self-harm or suicide but just states she is miserable all the time and her anxiety is "through the roof."  She is on 4 mg of Xanax a day.  I stated that we could add propranolol which could help her anxiety and also blood pressure.  She could not go to the ketamine clinic that she called last time.  She is still going to pursue this.  She is going to start with a new therapist.  I explained that if she did not start taking care of her own self and her health that nothing else would improve.  Again she made numerous excuses Visit Diagnosis:    ICD-10-CM   1. Major depressive disorder, recurrent, severe without psychotic features (HCC)  F33.2     2. GAD (generalized anxiety disorder)  F41.1     3. Attention deficit hyperactivity disorder (ADHD), predominantly hyperactive type  F90.1       Past Psychiatric History: Long-term outpatient treatment for depression and anxiety  Past Medical History:  Past Medical History:  Diagnosis Date   Abnormal Papanicolaou smear of cervix with positive human papilloma virus (HPV) test 10/11/2018   LSIL +  HPV, get colpo___   Abnormal uterine bleeding (AUB) 12/09/2014   Anxiety    Arthritis    Depression    DJD (degenerative joint disease)    Dumping syndrome    Dysmenorrhea 07/15/2014   Fatigue 12/24/2012   Fibromyalgia diagnosed April 2016   Headache    Hx of migraine headaches 12/24/2012   Inappropriate sinus tachycardia    Irregular menstrual bleeding 07/15/2014   Pelvic pain in female 03/03/2016   Pneumothorax, spontaneous, tension    Post concussion syndrome    POTS (postural orthostatic tachycardia syndrome)    PTSD (post-traumatic stress disorder)    Scoliosis    Thyroid disease    Tick bite 03/03/2016   Vitamin B 12 deficiency      Past Surgical History:  Procedure Laterality Date   bone spurs toes Right    CESAREAN SECTION     COLONOSCOPY     endooscopy     KNEE SURGERY     rotate cuff lt arm      Family Psychiatric History: See below  Family History:  Family History  Problem Relation Age of Onset   Hypertension Father    Atrial fibrillation Father    Alcohol abuse Father    Cancer Maternal Grandmother        skin    Heart disease Maternal Grandmother    Other Maternal Grandmother        had thyroid removed   Breast cancer Maternal Grandmother    Heart disease Paternal Grandfather    COPD Paternal Grandfather    Hypertension Paternal Grandfather    Diabetes Paternal Grandfather    Stroke Paternal Grandfather    Cancer Maternal Grandfather        bladder,lung   Anxiety disorder Maternal Aunt    Anxiety disorder Maternal Uncle    Bipolar disorder Maternal Uncle    Breast cancer Maternal Uncle        CML   Leukemia Other    Colon cancer Other        lung-2 maternal great uncles    Social History:  Social History   Socioeconomic History   Marital status: Legally Separated    Spouse name: Not on file   Number of children: 3   Years of education: Not on file   Highest education level: Not on file  Occupational History   Not on file  Tobacco Use   Smoking status: Former    Current packs/day: 0.00    Average packs/day: 1 pack/day for 24.5 years (24.5 ttl pk-yrs)    Types: Cigarettes    Start date: 03/13/1997    Quit date: 09/25/2021    Years since quitting: 1.7   Smokeless tobacco: Never   Tobacco comments:    "working on it" Was smoking 2.5 packs a day  Vaping Use   Vaping status: Never Used  Substance and Sexual Activity   Alcohol use: No   Drug use: No   Sexual activity: Yes    Partners: Male    Birth control/protection: Pill  Other Topics Concern   Not on file  Social History Narrative   Not on file   Social Determinants of Health   Financial Resource Strain: High Risk  (07/03/2022)   Overall Financial Resource Strain (CARDIA)    Difficulty of Paying Living Expenses: Very hard  Food Insecurity: Food Insecurity Present (07/03/2022)   Hunger Vital Sign    Worried About Running Out of Food in the Last Year: Sometimes true  Ran Out of Food in the Last Year: Never true  Transportation Needs: No Transportation Needs (07/03/2022)   PRAPARE - Administrator, Civil Service (Medical): No    Lack of Transportation (Non-Medical): No  Physical Activity: Inactive (01/07/2021)   Exercise Vital Sign    Days of Exercise per Week: 0 days    Minutes of Exercise per Session: 0 min  Stress: Stress Concern Present (07/03/2022)   [employer] of Occupational Health - Occupational Stress Questionnaire    Feeling of Stress : Very much  Social Connections: Moderately Integrated (07/03/2022)   Social Connection and Isolation Panel [NHANES]    Frequency of Communication with Friends and Family: More than three times a week    Frequency of Social Gatherings with Friends and Family: Once a week    Attends Religious Services: 1 to 4 times per year    Active Member of [employer] or Organizations: No    Attends Banker Meetings: Never    Marital Status: Married    Allergies:  Allergies  Allergen Reactions   Prozac [Fluoxetine]     Suicidal thoughts   Topiramate Other (See Comments)    Topamax-dizziness   Lexapro [Escitalopram Oxalate] Other (See Comments)    Flat affect, No emotions    Metabolic Disorder Labs: Lab Results  Component Value Date   HGBA1C 5.4 01/07/2013   MPG 108 01/07/2013   Lab Results  Component Value Date   PROLACTIN 13.2 06/12/2016   Lab Results  Component Value Date   CHOL 204 (H) 01/07/2021   TRIG 132 01/07/2021   HDL 72 01/07/2021   CHOLHDL 2.8 01/07/2021   VLDL 31 01/07/2013   LDLCALC 109 (H) 01/07/2021   LDLCALC 80 10/08/2018   Lab Results  Component Value Date   TSH 4.080 01/07/2021   TSH 1.971 10/24/2019     Therapeutic Level Labs: Lab Results  Component Value Date   LITHIUM 0.45 (L) 01/27/2020   LITHIUM 0.24 (L) 09/03/2019   No results found for: "VALPROATE" No results found for: "CBMZ"  Current Medications: Current Outpatient Medications  Medication Sig Dispense Refill   propranolol (INDERAL) 10 MG tablet Take 1 tablet (10 mg total) by mouth 3 (three) times daily. 90 tablet 2   ALPRAZolam (XANAX) 1 MG tablet Take 1 tablet (1 mg total) by mouth 4 (four) times daily. 120 tablet 2   amphetamine-dextroamphetamine (ADDERALL) 20 MG tablet Take 1 tablet (20 mg total) by mouth 3 (three) times daily. 90 tablet 0   amphetamine-dextroamphetamine (ADDERALL) 20 MG tablet Take 1 tablet (20 mg total) by mouth 3 (three) times daily. Dx code F90.0 ADD 90 tablet 0   ARIPiprazole (ABILIFY) 2 MG tablet Take 1 tablet (2 mg total) by mouth daily. 30 tablet 2   FLUoxetine (PROZAC) 40 MG capsule Take 1 capsule (40 mg total) by mouth daily. 30 capsule 2   ibuprofen (ADVIL,MOTRIN) 200 MG tablet Take 400-600 mg by mouth every 6 (six) hours as needed for mild pain or moderate pain.     megestrol (MEGACE) 40 MG tablet 3 tablets a day for 5 days, 2 tablets a day for 5 days then 1 tablet daily 45 tablet 3   ondansetron (ZOFRAN-ODT) 4 MG disintegrating tablet Take 1 tablet (4 mg total) by mouth every 8 (eight) hours as needed. 20 tablet 1   Current Facility-Administered Medications  Medication Dose Route Frequency Provider Last Rate Last Admin   ipratropium (ATROVENT) 0.03 % nasal spray 2 spray  2 spray Each Nare TID Ward, Tylene Fantasia, PA-C         Musculoskeletal: Strength & Muscle Tone: within normal limits Gait & Station: normal Patient leans: N/A  Psychiatric Specialty Exam: Review of Systems  There were no vitals taken for this visit.There is no height or weight on file to calculate BMI.  General Appearance: Casual and Fairly Groomed  Eye Contact:  Good  Speech:  Clear and Coherent  Volume:  Decreased   Mood:  Anxious, Depressed, and Hopeless  Affect:  Flat and Tearful  Thought Process:  Goal Directed  Orientation:  Full (Time, Place, and Person)  Thought Content: Rumination   Suicidal Thoughts:  No  Homicidal Thoughts:  No  Memory:  Immediate;   Good Recent;   Good Remote;   Good  Judgement:  Poor  Insight:  Shallow  Psychomotor Activity:  Decreased  Concentration:  Concentration: Fair and Attention Span: Fair  Recall:  Good  Fund of Knowledge: Good  Language: Good  Akathisia:  No  Handed:  Right  AIMS (if indicated): not done  Assets:  Communication Skills Desire for Improvement Resilience  ADL's:  Intact  Cognition: WNL  Sleep:  Fair   Screenings: GAD-7    Flowsheet Row Office Visit from 07/03/2022 in [facility] for [employer] at [facility] Counselor from 05/31/2021 in [facility] at [place] Office Visit from 01/07/2021 in [facility]  Total GAD-7 Score 21 20 16       Mini-Mental    Flowsheet Row Office Visit from 10/25/2016 in [facility]  Total Score (max 30 points ) 29      PHQ2-9    Flowsheet Row Office Visit from 07/03/2022 in [facility] at [facility] Video Visit from 06/29/2022 in [facility] at [place] Video Visit from 04/11/2022 in [facility] at [place] Video Visit from 03/03/2022 in [facility] at [place] Video Visit from 02/03/2022 in [facility] at [facility] Total Score 6 1 2 5 6   PHQ-9 Total Score 21 -- 6 13 12       Flowsheet Row ED from 03/13/2023 in [facility] at [facility] ED from 03/10/2023 in [facility] Emergency Department at [facility] ED from 12/12/2022 in [facility] Department at [facility]  C-SSRS RISK CATEGORY No Risk No Risk No Risk         Assessment and Plan: This patient is a 42 year old female with a history of depression chronic fatigue social anxiety ADD and borderline personality traits.  She is very distressed and distraught today but much of this is due to her inability to say no to others and take care of herself.  I suggested inpatient hospitalization but she declined.  She denies being dangerous to self or others.  She will continue Prozac 40 mg daily for depression, Abilify 2 mg daily for augmentation, Adderall 20 mg up to 3 times daily for ADD.  Propranolol 10 mg up 3 times daily will be added for anxiety and blood pressure.  She will also continue Xanax 1 mg 4 times daily.  She will return to see me in 2 weeks.  Collaboration of Care: Collaboration of Care: Primary Care Provider AEB notes will be shared with PCP as soon as she establishes with 1  Patient/Guardian was advised Release of Information  must be obtained prior to any record release in order to collaborate their care with an outside provider. Patient/Guardian was advised if they have not already done so to contact the registration department to sign all necessary forms in order for [place] to release information regarding their care.   Consent: Patient/Guardian gives verbal consent for treatment and assignment of benefits for services provided during this visit. Patient/Guardian expressed understanding and agreed to proceed.    Diannia Ruder, MD 06/22/2023, 10:52 AM

## 2023-07-06 ENCOUNTER — Encounter: Payer: Medicare Other | Admitting: Psychology

## 2023-07-06 NOTE — Progress Notes (Deleted)
° ° ° ° ° ° ° ° ° ° ° ° ° ° °  Destiny Herzberg, LCSW °

## 2023-07-06 NOTE — Progress Notes (Signed)
This encounter was created in error - please disregard.

## 2023-07-09 ENCOUNTER — Other Ambulatory Visit (HOSPITAL_COMMUNITY): Payer: Self-pay | Admitting: Psychiatry

## 2023-07-09 ENCOUNTER — Telehealth (INDEPENDENT_AMBULATORY_CARE_PROVIDER_SITE_OTHER): Payer: Medicare Other | Admitting: Psychiatry

## 2023-07-09 ENCOUNTER — Encounter (HOSPITAL_COMMUNITY): Payer: Self-pay | Admitting: Psychiatry

## 2023-07-09 DIAGNOSIS — F901 Attention-deficit hyperactivity disorder, predominantly hyperactive type: Secondary | ICD-10-CM | POA: Diagnosis not present

## 2023-07-09 DIAGNOSIS — F411 Generalized anxiety disorder: Secondary | ICD-10-CM | POA: Diagnosis not present

## 2023-07-09 DIAGNOSIS — F332 Major depressive disorder, recurrent severe without psychotic features: Secondary | ICD-10-CM | POA: Diagnosis not present

## 2023-07-09 MED ORDER — CITALOPRAM HYDROBROMIDE 20 MG PO TABS: 20.0000 mg | ORAL_TABLET | Freq: Two times a day (BID) | ORAL | 2 refills | Status: DC

## 2023-07-09 MED ORDER — ALPRAZOLAM 1 MG PO TABS: 1.0000 mg | ORAL_TABLET | Freq: Four times a day (QID) | ORAL | 2 refills | Status: DC

## 2023-07-09 MED ORDER — FLUOXETINE HCL 40 MG PO CAPS: 40.0000 mg | ORAL_CAPSULE | Freq: Every day | ORAL | 2 refills | Status: DC

## 2023-07-09 MED ORDER — AMPHETAMINE-DEXTROAMPHETAMINE 20 MG PO TABS: 20.0000 mg | ORAL_TABLET | Freq: Three times a day (TID) | ORAL | 0 refills | Status: DC

## 2023-07-09 MED ORDER — ARIPIPRAZOLE 2 MG PO TABS: 2.0000 mg | ORAL_TABLET | Freq: Every day | ORAL | 2 refills | Status: DC

## 2023-07-09 MED ORDER — PROPRANOLOL HCL 20 MG PO TABS: 20.0000 mg | ORAL_TABLET | Freq: Three times a day (TID) | ORAL | 2 refills | Status: DC

## 2023-07-09 NOTE — Progress Notes (Signed)
Virtual Visit via Video Note  I connected with Destiny Orr on 07/09/23 at  9:20 AM EDT by a video enabled telemedicine application and verified that I am speaking with the correct person using two identifiers.  Location: Patient: home Provider: office   I discussed the limitations of evaluation and management by telemedicine and the availability of in person appointments. The patient expressed understanding and agreed to proceed.     I discussed the assessment and treatment plan with the patient. The patient was provided an opportunity to ask questions and all were answered. The patient agreed with the plan and demonstrated an understanding of the instructions.   The patient was advised to call back or seek an in-person evaluation if the symptoms worsen or if the condition fails to improve as anticipated.  I provided 20 minutes of non-face-to-face time during this encounter.   Diannia Ruder, MD  [facility] MD/PA/NP OP Progress Note  07/09/2023 9:44 AM Destiny Orr  MRN:  [id number]  Chief Complaint:  Chief Complaint  Patient presents with   Anxiety   Depression   Follow-up   HPI: This patient is a 42 year old separated white female who lives with her ex-husband 1 son and 1 daughter her older daughter and baby in [place].  She is on disability.  The patient returns after 2 weeks regarding her depression anxiety and ADHD.  She states that things are still very stressful at home.  She and her parents had a big argument about them siding with her daughter against her and now she has blocked her parents and is not talking to them at all.  She states that her daughter is doing slightly better helping out with the baby but not much.  She feels stressed and overwhelmed.  She is not focusing that well with the Adderall but I suggested she break 1 and half so she can take 30 mg twice daily and she will try this we added propranolol last time because of the jitteriness and hypertension but she definitely  thinks it is helping "a little so we will increase the dosage.  On the positive side she has made a connection with a therapist at [facility] primary care and will also be going to a ketamine clinic in [place] for an assessment.  She also has an appointment with a primary care physician in a couple of weeks.  She does seem a little less stressed than she did last time.  She currently denies thoughts of self-harm or suicide but states that she had suicidal thoughts within the last 2 weeks but plans never to act on them. Visit Diagnosis:    ICD-10-CM   1. Major depressive disorder, recurrent, severe without psychotic features (HCC)  F33.2     2. GAD (generalized anxiety disorder)  F41.1     3. Attention deficit hyperactivity disorder (ADHD), predominantly hyperactive type  F90.1       Past Psychiatric History: Long-term outpatient treatment for depression and anxiety  Past Medical History:  Past Medical History:  Diagnosis Date   Abnormal Papanicolaou smear of cervix with positive human papilloma virus (HPV) test 10/11/2018   LSIL +HPV, get colpo___   Abnormal uterine bleeding (AUB) 12/09/2014   Anxiety    Arthritis    Depression    DJD (degenerative joint disease)    Dumping syndrome    Dysmenorrhea 07/15/2014   Fatigue 12/24/2012   Fibromyalgia diagnosed April 2016   Headache    Hx of migraine headaches 12/24/2012   Inappropriate  sinus tachycardia (HCC)    Irregular menstrual bleeding 07/15/2014   Pelvic pain in female 03/03/2016   Pneumothorax, spontaneous, tension    Post concussion syndrome    POTS (postural orthostatic tachycardia syndrome)    PTSD (post-traumatic stress disorder)    Scoliosis    Thyroid disease    Tick bite 03/03/2016   Vitamin B 12 deficiency     Past Surgical History:  Procedure Laterality Date   bone spurs toes Right    CESAREAN SECTION     COLONOSCOPY     endooscopy     KNEE SURGERY     rotate cuff lt arm      Family Psychiatric History: See  below  Family History:  Family History  Problem Relation Age of Onset   Hypertension Father    Atrial fibrillation Father    Alcohol abuse Father    Cancer Maternal Grandmother        skin    Heart disease Maternal Grandmother    Other Maternal Grandmother        had thyroid removed   Breast cancer Maternal Grandmother    Heart disease Paternal Grandfather    COPD Paternal Grandfather    Hypertension Paternal Grandfather    Diabetes Paternal Grandfather    Stroke Paternal Grandfather    Cancer Maternal Grandfather        bladder,lung   Anxiety disorder Maternal Aunt    Anxiety disorder Maternal Uncle    Bipolar disorder Maternal Uncle    Breast cancer Maternal Uncle        CML   Leukemia Other    Colon cancer Other        lung-2 maternal great uncles    Social History:  Social History   Socioeconomic History   Marital status: Legally Separated    Spouse name: Not on file   Number of children: 3   Years of education: Not on file   Highest education level: Not on file  Occupational History   Not on file  Tobacco Use   Smoking status: Former    Current packs/day: 0.00    Average packs/day: 1 pack/day for 24.5 years (24.5 ttl pk-yrs)    Types: Cigarettes    Start date: 03/13/1997    Quit date: 09/25/2021    Years since quitting: 1.7   Smokeless tobacco: Never   Tobacco comments:    "working on it" Was smoking 2.5 packs a day  Vaping Use   Vaping status: Never Used  Substance and Sexual Activity   Alcohol use: No   Drug use: No   Sexual activity: Yes    Partners: Male    Birth control/protection: Pill  Other Topics Concern   Not on file  Social History Narrative   Not on file   Social Determinants of Health   Financial Resource Strain: High Risk (07/03/2022)   Overall Financial Resource Strain (CARDIA)    Difficulty of Paying Living Expenses: Very hard  Food Insecurity: Food Insecurity Present (07/03/2022)   Hunger Vital Sign    Worried About Running  Out of Food in the Last Year: Sometimes true    Ran Out of Food in the Last Year: Never true  Transportation Needs: No Transportation Needs (07/03/2022)   PRAPARE - Administrator, Civil Service (Medical): No    Lack of Transportation (Non-Medical): No  Physical Activity: Inactive (01/07/2021)   Exercise Vital Sign    Days of Exercise per Week: 0 days  Minutes of Exercise per Session: 0 min  Stress: Stress Concern Present (07/03/2022)   [employer] of Occupational Health - Occupational Stress Questionnaire    Feeling of Stress : Very much  Social Connections: Moderately Integrated (07/03/2022)   Social Connection and Isolation Panel [NHANES]    Frequency of Communication with Friends and Family: More than three times a week    Frequency of Social Gatherings with Friends and Family: Once a week    Attends Religious Services: 1 to 4 times per year    Active Member of [employer] or Organizations: No    Attends Banker Meetings: Never    Marital Status: Married    Allergies:  Allergies  Allergen Reactions   Prozac [Fluoxetine]     Suicidal thoughts   Topiramate Other (See Comments)    Topamax-dizziness   Lexapro [Escitalopram Oxalate] Other (See Comments)    Flat affect, No emotions    Metabolic Disorder Labs: Lab Results  Component Value Date   HGBA1C 5.4 01/07/2013   MPG 108 01/07/2013   Lab Results  Component Value Date   PROLACTIN 13.2 06/12/2016   Lab Results  Component Value Date   CHOL 204 (H) 01/07/2021   TRIG 132 01/07/2021   HDL 72 01/07/2021   CHOLHDL 2.8 01/07/2021   VLDL 31 01/07/2013   LDLCALC 109 (H) 01/07/2021   LDLCALC 80 10/08/2018   Lab Results  Component Value Date   TSH 4.080 01/07/2021   TSH 1.971 10/24/2019    Therapeutic Level Labs: Lab Results  Component Value Date   LITHIUM 0.45 (L) 01/27/2020   LITHIUM 0.24 (L) 09/03/2019   No results found for: "VALPROATE" No results found for: "CBMZ"  Current  Medications: Current Outpatient Medications  Medication Sig Dispense Refill   propranolol (INDERAL) 20 MG tablet Take 1 tablet (20 mg total) by mouth 3 (three) times daily. 90 tablet 2   ALPRAZolam (XANAX) 1 MG tablet Take 1 tablet (1 mg total) by mouth 4 (four) times daily. 120 tablet 2   amphetamine-dextroamphetamine (ADDERALL) 20 MG tablet Take 1 tablet (20 mg total) by mouth 3 (three) times daily. 90 tablet 0   amphetamine-dextroamphetamine (ADDERALL) 20 MG tablet Take 1 tablet (20 mg total) by mouth 3 (three) times daily. Dx code F90.0 ADD 90 tablet 0   ARIPiprazole (ABILIFY) 2 MG tablet Take 1 tablet (2 mg total) by mouth daily. 30 tablet 2   FLUoxetine (PROZAC) 40 MG capsule Take 1 capsule (40 mg total) by mouth daily. 30 capsule 2   ibuprofen (ADVIL,MOTRIN) 200 MG tablet Take 400-600 mg by mouth every 6 (six) hours as needed for mild pain or moderate pain.     megestrol (MEGACE) 40 MG tablet 3 tablets a day for 5 days, 2 tablets a day for 5 days then 1 tablet daily 45 tablet 3   ondansetron (ZOFRAN-ODT) 4 MG disintegrating tablet Take 1 tablet (4 mg total) by mouth every 8 (eight) hours as needed. 20 tablet 1   Current Facility-Administered Medications  Medication Dose Route Frequency Provider Last Rate Last Admin   ipratropium (ATROVENT) 0.03 % nasal spray 2 spray  2 spray Each Nare TID Ward, Tylene Fantasia, PA-C         Musculoskeletal: Strength & Muscle Tone: within normal limits Gait & Station: normal Patient leans: N/A  Psychiatric Specialty Exam: Review of Systems  Neurological:  Positive for headaches.  Psychiatric/Behavioral:  Positive for dysphoric mood. The patient is nervous/anxious.   All other systems  reviewed and are negative.   There were no vitals taken for this visit.There is no height or weight on file to calculate BMI.  General Appearance: Casual and Fairly Groomed  Eye Contact:  Good  Speech:  Clear and Coherent  Volume:  Normal  Mood:  Anxious and Depressed   Affect:  Flat  Thought Process:  Goal Directed  Orientation:  Full (Time, Place, and Person)  Thought Content: Rumination   Suicidal Thoughts:  No  Homicidal Thoughts:  No  Memory:  Immediate;   Good Recent;   Good Remote;   Good  Judgement:  Fair  Insight:  Shallow  Psychomotor Activity:  Decreased  Concentration:  Concentration: Fair and Attention Span: Fair  Recall:  Good  Fund of Knowledge: Good  Language: Good  Akathisia:  No  Handed:  Right  AIMS (if indicated): not done  Assets:  Communication Skills Desire for Improvement Resilience Social Support Talents/Skills  ADL's:  Intact  Cognition: WNL  Sleep:  Fair   Screenings: GAD-7    Garment/textile technologist Visit from 07/03/2022 in [facility] for [employer] at [facility] Counselor from 05/31/2021 in [facility] [place] Office Visit from 01/07/2021 in [facility]  Total GAD-7 Score 21 20 16       Mini-Mental    Flowsheet Row Office Visit from 10/25/2016 in [facility]  Total Score (max 30 points ) 29      PHQ2-9    Flowsheet Row Office Visit from 07/03/2022 in [facility] for [facility] at [facility] Video Visit from 06/29/2022 in [facility] [place] Video Visit from 04/11/2022 in [facility] at [place] Video Visit from 03/03/2022 in [facility] at [place] Video Visit from 02/03/2022 in [facility] at [facility] Total Score 6 1 2 5 6   PHQ-9 Total Score 21 -- 6 13 12       Flowsheet Row ED from 03/13/2023 in [facility] ED from 03/10/2023 in [facility] at [facility] ED from 12/12/2022 in [facility] at [facility]  C-SSRS RISK CATEGORY No Risk No Risk No Risk        Assessment and  Plan: This patient is a 42 year old female with history depression chronic fatigue social anxiety ADD and borderline personality traits.  She does seem slightly better compared to last visit although she is still very stressed as to her credit that she has been able to find a therapist in the ketamine clinic.  She denies thoughts of being dangerous to self.  She will continue Prozac 40 mg daily for depression, Abilify 2 mg daily for augmentation, Adderall 20 mg up to 3 times daily for ADD and Xanax 1 mg 4 times daily for anxiety.  She will increase propranolol to 20 mg 3 times daily for anxiety and hypertension.  She will return to see me in 4 weeks  Collaboration of Care: Collaboration of Care: Primary Care Provider AEB notes will be shared with PCP once she establishes care.  Patient/Guardian was advised Release of Information must be obtained prior to any record release in order to collaborate their care with an outside provider. Patient/Guardian was advised if they have not already done so to contact the registration department to sign all necessary forms in order for [place] to release  information regarding their care.   Consent: Patient/Guardian gives verbal consent for treatment and assignment of benefits for services provided during this visit. Patient/Guardian expressed understanding and agreed to proceed.    Diannia Ruder, MD 07/09/2023, 9:44 AM

## 2023-07-10 ENCOUNTER — Ambulatory Visit: Payer: Medicare Other

## 2023-07-10 ENCOUNTER — Ambulatory Visit
Admission: RE | Admit: 2023-07-10 | Discharge: 2023-07-10 | Disposition: A | Discharge status: Home / Self Care | Payer: Medicare Other | Source: Ambulatory Visit | Attending: Nurse Practitioner | Admitting: Nurse Practitioner

## 2023-07-10 VITALS — BP 126/81 | HR 77 | Temp 98.6°F | Resp 15

## 2023-07-10 DIAGNOSIS — Z23 Encounter for immunization: Secondary | ICD-10-CM | POA: Diagnosis not present

## 2023-07-10 DIAGNOSIS — S61012A Laceration without foreign body of left thumb without damage to nail, initial encounter: Secondary | ICD-10-CM

## 2023-07-10 MED ORDER — TETANUS-DIPHTH-ACELL PERTUSSIS 5-2.5-18.5 LF-MCG/0.5 IM SUSY
0.5000 mL | PREFILLED_SYRINGE | Freq: Once | INTRAMUSCULAR | Status: AC
  Administered 2023-07-10: 0.5 mL via INTRAMUSCULAR

## 2023-07-10 NOTE — ED Provider Notes (Signed)
[facility]    CSN: [id number] Arrival date & time: 07/10/23  1141      History   Chief Complaint Chief Complaint  Patient presents with   Laceration    Hand injury caused by Laceration on thumb at knuckle joint with numbness/tingling, cold to the touch on thumb/index finger. Some Swelling, bruising/discoloration & pain below cut. - Entered by patient    HPI Destiny Orr is a 42 y.o. female.   Patient presents today with laceration to left thumb that she sustained last night.  Reports she was using a pocket knife to screw in the knob for a dresser drawer when the knife slipped and went into her thumb.  Reports she initially saw "something white" inside the wound bed and her hands and arms were covered in blood from the bleeding.  She was able to control the bleeding last night and applied steri strips to the wound.  Reports when she took the steri strips and gauze off this morning, she felt that her finger was cold to touch and was starting to bruise and she became concerned.  She also reports there is an area that feels numb on her thumb.  Denies history of surgeries to the thumb or left hand.  Has been cleaning wound with peroxide.    Past Medical History:  Diagnosis Date   Abnormal Papanicolaou smear of cervix with positive human papilloma virus (HPV) test 10/11/2018   LSIL +HPV, get colpo___   Abnormal uterine bleeding (AUB) 12/09/2014   Anxiety    Arthritis    Depression    DJD (degenerative joint disease)    Dumping syndrome    Dysmenorrhea 07/15/2014   Fatigue 12/24/2012   Fibromyalgia diagnosed April 2016   Headache    Hx of migraine headaches 12/24/2012   Inappropriate sinus tachycardia (HCC)    Irregular menstrual bleeding 07/15/2014   Pelvic pain in female 03/03/2016   Pneumothorax, spontaneous, tension    Post concussion syndrome    POTS (postural orthostatic tachycardia syndrome)    PTSD (post-traumatic stress disorder)    Scoliosis    Thyroid disease     Tick bite 03/03/2016   Vitamin B 12 deficiency     Patient Active Problem List   Diagnosis Date Noted   Encounter for surveillance of contraceptive pills 01/07/2021   History of abnormal cervical Pap smear 01/07/2021   Screening cholesterol level 01/07/2021   Vitamin D deficiency 01/20/2020   Hypothyroidism 11/15/2018   Abnormal Papanicolaou smear of cervix with positive human papilloma virus (HPV) test 10/11/2018   Goiter 10/08/2018   Breast tenderness 10/08/2018   Mass of upper outer quadrant of right breast 10/08/2018   Encounter for gynecological examination with Papanicolaou smear of cervix 10/08/2018   Current smoker 07/03/2017   Leukocytosis 06/18/2017   Thyroid nodule 12/26/2016   Neck injury, initial encounter 10/20/2016   Concussion with loss of consciousness 10/20/2016   Injury of left shoulder 10/20/2016   Sinus tachycardia 10/16/2016   Post concussion syndrome 10/03/2016   Intractable headache 10/03/2016   Dizziness 10/03/2016   Blurry vision, right eye 10/03/2016   Nodular goiter 06/18/2016   Fibromyalgia syndrome 06/16/2016   Raynaud's syndrome without gangrene 06/16/2016   Numbness and tingling sensation of skin 06/16/2016   B12 deficiency 06/16/2016   Dizziness and giddiness 06/16/2016   Pelvic pain in female 03/03/2016   Tick bite 03/03/2016   POTS (postural orthostatic tachycardia syndrome) 02/05/2016   Abnormal uterine bleeding (AUB) 12/09/2014   Depression  11/26/2014   Dysmenorrhea 07/15/2014   Irregular menstrual bleeding 07/15/2014   Back pain, chronic 04/02/2013   Chronic fatigue and malaise 12/24/2012   Anxiety 12/24/2012   PTSD (post-traumatic stress disorder) 12/24/2012   ADD (attention deficit disorder) 12/24/2012   Contraception 12/24/2012   Hx of migraine headaches 12/24/2012    Past Surgical History:  Procedure Laterality Date   bone spurs toes Right    CESAREAN SECTION     COLONOSCOPY     endooscopy     KNEE SURGERY     rotate cuff  lt arm      OB History     Gravida  3   Para  2   Term  2   Preterm      AB  1   Living  3      SAB      IAB      Ectopic      Multiple  1   Live Births  3            Home Medications    Prior to Admission medications   Medication Sig Start Date End Date Taking? Authorizing Provider  ALPRAZolam Prudy Feeler) 1 MG tablet Take 1 tablet (1 mg total) by mouth 4 (four) times daily. 07/09/23 07/08/24  Myrlene Broker, MD  amphetamine-dextroamphetamine (ADDERALL) 20 MG tablet Take 1 tablet (20 mg total) by mouth 3 (three) times daily. 03/16/23 03/15/24  Myrlene Broker, MD  amphetamine-dextroamphetamine (ADDERALL) 20 MG tablet Take 1 tablet (20 mg total) by mouth 3 (three) times daily. Dx code F90.0 ADD 07/09/23   Myrlene Broker, MD  ARIPiprazole (ABILIFY) 2 MG tablet Take 1 tablet (2 mg total) by mouth daily. 07/09/23   Myrlene Broker, MD  citalopram (CELEXA) 20 MG tablet Take 1 tablet (20 mg total) by mouth 2 (two) times daily. 07/09/23 07/08/24  Myrlene Broker, MD  ibuprofen (ADVIL,MOTRIN) 200 MG tablet Take 400-600 mg by mouth every 6 (six) hours as needed for mild pain or moderate pain.    [provider]  megestrol (MEGACE) 40 MG tablet 3 tablets a day for 5 days, 2 tablets a day for 5 days then 1 tablet daily 08/29/22   Lazaro Arms, MD  ondansetron (ZOFRAN-ODT) 4 MG disintegrating tablet Take 1 tablet (4 mg total) by mouth every 8 (eight) hours as needed. 02/23/23   Margaretann Loveless, PA-C  propranolol (INDERAL) 20 MG tablet Take 1 tablet (20 mg total) by mouth 3 (three) times daily. 07/09/23   Myrlene Broker, MD    Family History Family History  Problem Relation Age of Onset   Hypertension Father    Atrial fibrillation Father    Alcohol abuse Father    Cancer Maternal Grandmother        skin    Heart disease Maternal Grandmother    Other Maternal Grandmother        had thyroid removed   Breast cancer Maternal Grandmother    Heart disease Paternal  Grandfather    COPD Paternal Grandfather    Hypertension Paternal Grandfather    Diabetes Paternal Grandfather    Stroke Paternal Grandfather    Cancer Maternal Grandfather        bladder,lung   Anxiety disorder Maternal Aunt    Anxiety disorder Maternal Uncle    Bipolar disorder Maternal Uncle    Breast cancer Maternal Uncle        CML   Leukemia Other  Colon cancer Other        lung-2 maternal great uncles    Social History Social History   Tobacco Use   Smoking status: Former    Current packs/day: 0.00    Average packs/day: 1 pack/day for 24.5 years (24.5 ttl pk-yrs)    Types: Cigarettes    Start date: 03/13/1997    Quit date: 09/25/2021    Years since quitting: 1.7   Smokeless tobacco: Never   Tobacco comments:    "working on it" Was smoking 2.5 packs a day  Vaping Use   Vaping status: Never Used  Substance Use Topics   Alcohol use: No   Drug use: No     Allergies   Prozac [fluoxetine], Topiramate, and Lexapro [escitalopram oxalate]   Review of Systems Review of Systems Per HPI  Physical Exam Triage Vital Signs ED Triage Vitals  Encounter Vitals Group     BP 07/10/23 1149 126/81     Systolic BP Percentile --      Diastolic BP Percentile --      Pulse Rate 07/10/23 1149 77     Resp 07/10/23 1149 15     Temp 07/10/23 1149 98.6 F (37 C)     Temp Source 07/10/23 1149 Oral     SpO2 07/10/23 1149 99 %     Weight --      Height --      Head Circumference --      Peak Flow --      Pain Score 07/10/23 1152 5     Pain Loc --      Pain Education --      Exclude from Growth Chart --    No data found.  Updated Vital Signs BP 126/81 (BP Location: Right Arm)   Pulse 77   Temp 98.6 F (37 C) (Oral)   Resp 15   LMP 06/20/2023 (Within Days)   SpO2 99%   Visual Acuity Right Eye Distance:   Left Eye Distance:   Bilateral Distance:    Right Eye Near:   Left Eye Near:    Bilateral Near:     Physical Exam Vitals and nursing note reviewed.   Constitutional:      General: She is not in acute distress.    Appearance: Normal appearance. She is not toxic-appearing.  HENT:     Mouth/Throat:     Mouth: Mucous membranes are moist.     Pharynx: Oropharynx is clear.  Pulmonary:     Effort: Pulmonary effort is normal. No respiratory distress.  Skin:    General: Skin is warm and dry.     Capillary Refill: Capillary refill takes less than 2 seconds.     Findings: Laceration present.     Comments: Approximately 1 cm laceration to lateral left thumb; there is mild bruising proximal to the wound.  Distal left thumb is neurovascularly intact.  Full ROM of left thumb.  Neurological:     Mental Status: She is alert and oriented to person, place, and time.  Psychiatric:        Behavior: Behavior is cooperative.      UC Treatments / Results  Labs (all labs ordered are listed, but only abnormal results are displayed) Labs Reviewed - No data to display  EKG   Radiology No results found.  Procedures Laceration Repair  Date/Time: 07/10/2023 2:13 PM  Performed by: Valentino Nose, NP Authorized by: Valentino Nose, NP   Consent:    Consent  obtained:  Verbal   Consent given by:  Patient   Risks, benefits, and alternatives were discussed: yes     Risks discussed:  Infection, pain, poor cosmetic result and poor wound healing Universal protocol:    Procedure explained and questions answered to patient or proxy's satisfaction: yes     Patient identity confirmed:  Verbally with patient Anesthesia:    Anesthesia method:  None Laceration details:    Location:  Finger   Finger location:  L thumb   Length (cm):  1 Exploration:    Hemostasis achieved with:  Direct pressure   Wound exploration: wound explored through full range of motion and entire depth of wound visualized     Contaminated: no   Treatment:    Area cleansed with:  Chlorhexidine   Amount of cleaning:  Standard Skin repair:    Repair method:  Tissue  adhesive Approximation:    Approximation:  Close Repair type:    Repair type:  Simple Post-procedure details:    Dressing:  Splint for protection   Procedure completion:  Tolerated well, no immediate complications  (including critical care time)  Medications Ordered in UC Medications  Tdap (BOOSTRIX) injection 0.5 mL (0.5 mLs Intramuscular Given 07/10/23 1209)    Initial Impression / Assessment and Plan / UC Course  I have reviewed the triage vital signs and the nursing notes.  Pertinent labs & imaging results that were available during my care of the patient were reviewed by me and considered in my medical decision making (see chart for details).   Patient is well-appearing, normotensive, afebrile, not tachycardic, not tachypneic, oxygenating well on room air.   1. Laceration of left thumb without foreign body without damage to nail, initial encounter No red flags; examination reassuring today Xray imaging obtained to rule out acute bony abnormality, low suspicion for open fracture however Discussed nature of injury and timeframe for improvement Laceration repair as above Tdap updated Wound care discussed; encouraged to seek care if she develops redness, swelling, or drainage from the area Strict ER precautions discussed  The patient was given the opportunity to ask questions.  All questions answered to their satisfaction.  The patient is in agreement to this plan.   Final Clinical Impressions(s) / UC Diagnoses   Final diagnoses:  Laceration of left thumb without foreign body without damage to nail, initial encounter     Discharge Instructions      We closed your laceration with skin glue today.  Please do not soak your finger because the skin glue will fall off quicker than we want to.  The skin glue should stay in for the next 1 to 2 weeks.  I will contact you later today if the x-ray shows that we need to start an antibiotic and if we need to have you follow-up with a  hand specialist.  In the meantime, recommend wearing the finger splint to help keep her finger in a neutral position and allow for wound healing.  Seek care if you develop redness swelling and pain around the cut.    ED Prescriptions   None    PDMP not reviewed this encounter.   Valentino Nose, NP 07/10/23 1422

## 2023-07-10 NOTE — Discharge Instructions (Signed)
We closed your laceration with skin glue today.  Please do not soak your finger because the skin glue will fall off quicker than we want to.  The skin glue should stay in for the next 1 to 2 weeks.  I will contact you later today if the x-ray shows that we need to start an antibiotic and if we need to have you follow-up with a hand specialist.  In the meantime, recommend wearing the finger splint to help keep her finger in a neutral position and allow for wound healing.  Seek care if you develop redness swelling and pain around the cut.

## 2023-07-10 NOTE — ED Triage Notes (Signed)
Pt c/o laceration to the left thumb, pt states she was fixing a dresser drawer with a pocket knife, pt states she put steri strips on it last night but every time she moves it it opens back up. There is numbness and tingling in the thumb below the cut, there is also bruising present.

## 2023-07-11 ENCOUNTER — Ambulatory Visit: Payer: Medicare Other | Admitting: Psychology

## 2023-07-11 DIAGNOSIS — F332 Major depressive disorder, recurrent severe without psychotic features: Secondary | ICD-10-CM

## 2023-07-11 DIAGNOSIS — F901 Attention-deficit hyperactivity disorder, predominantly hyperactive type: Secondary | ICD-10-CM

## 2023-07-11 DIAGNOSIS — F411 Generalized anxiety disorder: Secondary | ICD-10-CM | POA: Diagnosis not present

## 2023-07-11 DIAGNOSIS — F4310 Post-traumatic stress disorder, unspecified: Secondary | ICD-10-CM | POA: Diagnosis not present
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
## 2023-07-25 ENCOUNTER — Ambulatory Visit (INDEPENDENT_AMBULATORY_CARE_PROVIDER_SITE_OTHER): Payer: Medicare Other | Admitting: Psychology

## 2023-07-25 DIAGNOSIS — F901 Attention-deficit hyperactivity disorder, predominantly hyperactive type: Secondary | ICD-10-CM

## 2023-07-25 DIAGNOSIS — F411 Generalized anxiety disorder: Secondary | ICD-10-CM

## 2023-07-25 DIAGNOSIS — F332 Major depressive disorder, recurrent severe without psychotic features: Secondary | ICD-10-CM

## 2023-07-25 NOTE — Progress Notes (Signed)
[facility] Counselor/Therapist Progress Note  Patient ID: Floy Ohta, MRN: [id number]   Date: 07/25/23  Time Spent: 1:00  pm - 1:53 pm : 53 Minutes  Treatment Type: Individual Therapy.  Reported Symptoms: depression, anxiety, ADHD, and PTSD.    Mental Status Exam: Appearance:  Casual     Behavior: Appropriate  Motor: Normal  Speech/Language:  Clear and Coherent  Affect: Congruent  Mood: dysthymic  Thought process: normal  Thought content:   WNL  Sensory/Perceptual disturbances:   WNL  Orientation: oriented to person, place, time/date, and situation  Attention: Good  Concentration: Good  Memory: WNL  Fund of knowledge:  Good  Insight:   Good  Judgment:  Good  Impulse Control: Good   Risk Assessment: Danger to Self:  No, but noted having SI during a medication reaction (Prozac and Lexapro).  Self-injurious Behavior: No Danger to Others: No Duty to Warn:no Physical Aggression / Violence:No  Access to Firearms a concern: No  Gang Involvement:No   In case of a mental health emergency:  25 - confidential suicide hotline. [facility]):        [address], [place] [address]       [phone] 3.   911  4.   Visiting Nearest ED.    Subjective:   Mykiah Martin participated from home, via video and consented to treatment. Therapist participated from home office.  Malone reviewed the events of the past week. Janai noted stressors that led to the delay of this appointment due to a lack of privacy. We rescheduled the appointment for later in the day. She noted feeling a bout of vertigo and will be visiting her provider, next week, to get an annual physical and address this concern.   We reviewed numerous treatment approaches including CBT, BA, Problem Solving, and Solution focused therapy. Psych-education regarding the Maleena's diagnosis of Major depressive disorder, recurrent, severe without psychotic features (HCC)  GAD  (generalized anxiety disorder)  Attention deficit hyperactivity disorder (ADHD), predominantly hyperactive type was provided during the session. We discussed Everlean Messler's goals treatment goals which include set boundaries for others, engage in self-care and prioritize self,  process past events (abortion), improving self-image, address energy & motivation, managing symptoms, increasing mindfulness. Additional goals include building a support system, processing loss of best friend (2023), improve distress tolerance, identifying areas of control and lack of control. Shonta Lye provided verbal approval of the treatment plan.   Interventions: Psycho-education & Goal Setting.   Diagnosis:  Major depressive disorder, recurrent, severe without psychotic features (HCC)  GAD (generalized anxiety disorder)  Attention deficit hyperactivity disorder (ADHD), predominantly hyperactive type  Psychiatric Treatment: Yes , Diannia Ruder, MD.    Treatment Plan:  Client Abilities/Strengths Bennie is forthcoming and motivated for change.   Support System: None  Client Treatment Preferences Outpatient Therapy.   Client Statement of Needs Emerlyn would like to set boundaries for others, engage in self-care and prioritize self,  process past events (abortion), improving self-image, address energy & motivation, managing symptoms, increasing mindfulness. Additional goals include building a support system, processing loss of best friend (2023), improve distress tolerance, identifying areas of control and lack of control.  Treatment Level Weekly  Symptoms  Anxiety: feeling anxious, difficulty managing worry, worrying too much about different things, trouble relaxing, restlessness, irritability, and feeling afraid something awful might happen, rumination, hypervigilance, social anxiety.   (Status: maintained)  Depression: Loss of interest  feeling down, insomnia and middle insomnia,  low appetite, feeling bad about  self, trouble concentrating, psychomotor agitation,    (Status: maintained)  Goals:   Lisette experiences symptoms of depression, anxiety, and ADHD.   Treatment plan signed and available on s-drive:  No    Target Date: 07/24/24 Frequency: Weekly  Progress: 0 Modality: individual    Therapist will provide referrals for additional resources as appropriate.  Therapist will provide psycho-education regarding Latausha's diagnosis and corresponding treatment approaches and interventions. Licensed Clinical Social Worker, Chelsea, LCSW will support the patient's ability to achieve the goals identified. will employ CBT, BA, Problem-solving, Solution Focused, Mindfulness,  coping skills, & other evidenced-based practices will be used to promote progress towards healthy functioning to help manage decrease symptoms associated with his diagnosis.   Reduce overall level, frequency, and intensity of the feelings of depression & anxiety evidenced by decreased overall symptoms from 6 to 7 days/week to 0 to 1 days/week per client report for at least 3 consecutive months. Verbally express understanding of the relationship between feelings of depression, anxiety and their impact on thinking patterns and behaviors. Verbalize an understanding of the role that distorted thinking plays in creating fears, excessive worry, and ruminations.    (Brookelynne participated in the creation of the treatment plan)    Delight Ovens, LCSW

## 2023-07-31 ENCOUNTER — Ambulatory Visit: Payer: Medicare Other | Admitting: Family Medicine

## 2023-08-01 ENCOUNTER — Encounter: Payer: Medicare Other | Admitting: Psychology

## 2023-08-01 NOTE — Progress Notes (Unsigned)
                Destiny Pascal, LCSW This encounter was created in error - please disregard. 

## 2023-08-06 ENCOUNTER — Encounter (HOSPITAL_COMMUNITY): Payer: Self-pay | Admitting: Psychiatry

## 2023-08-06 ENCOUNTER — Telehealth (HOSPITAL_COMMUNITY): Payer: Medicare Other | Admitting: Psychiatry

## 2023-08-06 DIAGNOSIS — F411 Generalized anxiety disorder: Secondary | ICD-10-CM

## 2023-08-06 DIAGNOSIS — F332 Major depressive disorder, recurrent severe without psychotic features: Secondary | ICD-10-CM

## 2023-08-06 DIAGNOSIS — F901 Attention-deficit hyperactivity disorder, predominantly hyperactive type: Secondary | ICD-10-CM | POA: Diagnosis not present

## 2023-08-06 MED ORDER — CITALOPRAM HYDROBROMIDE 20 MG PO TABS: 20.0000 mg | ORAL_TABLET | Freq: Two times a day (BID) | ORAL | 2 refills | Status: DC

## 2023-08-06 MED ORDER — ALPRAZOLAM 1 MG PO TABS: 1.0000 mg | ORAL_TABLET | Freq: Four times a day (QID) | ORAL | 2 refills | Status: DC

## 2023-08-06 MED ORDER — AMPHETAMINE-DEXTROAMPHETAMINE 20 MG PO TABS: 20.0000 mg | ORAL_TABLET | Freq: Three times a day (TID) | ORAL | 0 refills | Status: DC

## 2023-08-06 MED ORDER — PROPRANOLOL HCL 20 MG PO TABS: 20.0000 mg | ORAL_TABLET | Freq: Three times a day (TID) | ORAL | 2 refills | Status: DC

## 2023-08-06 MED ORDER — CAPLYTA 21 MG PO CAPS: 21.0000 mg | ORAL_CAPSULE | Freq: Every day | ORAL | 2 refills | Status: DC

## 2023-08-06 NOTE — Progress Notes (Signed)
Virtual Visit via Video Note  I connected with Destiny Orr on 08/06/23 at 11:00 AM EST by a video enabled telemedicine application and verified that I am speaking with the correct person using two identifiers.  Location: Patient: home Provider: office   I discussed the limitations of evaluation and management by telemedicine and the availability of in person appointments. The patient expressed understanding and agreed to proceed.     I discussed the assessment and treatment plan with the patient. The patient was provided an opportunity to ask questions and all were answered. The patient agreed with the plan and demonstrated an understanding of the instructions.   The patient was advised to call back or seek an in-person evaluation if the symptoms worsen or if the condition fails to improve as anticipated.  I provided 20 minutes of non-face-to-face time during this encounter.   Diannia Ruder, MD  [facility] MD/PA/NP OP Progress Note  08/06/2023 11:17 AM Destiny Orr  MRN:  [id number]  Chief Complaint:  Chief Complaint  Patient presents with   Anxiety   Depression   ADD   Follow-up   HPI: This patient is a 42 year old separated white female who lives with her ex-husband 1 son and 1 daughter her older daughter and baby in [place]. She is on disability.   The patient returns for follow-up after 4 weeks regarding her depression anxiety and ADHD.  She still states that she is having difficult times.  She spent a week with most of the time in bed because she felt lightheaded.  She does not think this is from the propranolol because she is taking it now and she feels fine.  She got very depressed and often feels bad because her family does not appreciate her and her and her perspective.  She states that her moods are still up and down a good deal but mostly down.  She does not think the Abilify is helping.  She has tried Wellsite geologist before.  She just started therapy with a new therapist at [facility] but has only met with him twice.  She missed her primary care appointment because she felt too sick to go.  She missed her appointment for [place] evaluation.  She claims that she cannot afford it.  She seems very self-defeating.  I explained that things are not going to get better if she keeps missing appointments and not doing her self-care such as eating properly and drinking enough fluids.  She denies any thoughts of self-harm or suicide.  I suggested that we switch from Abilify to Caplyta in hopes that it will help her mood swings.  However I think therapy is going to be the major thing that helps her.  I suggested intensive outpatient program but she claims she cannot afford it. Visit Diagnosis:    ICD-10-CM   1. Major depressive disorder, recurrent, severe without psychotic features (HCC)  F33.2     2. GAD (generalized anxiety disorder)  F41.1     3. Attention deficit hyperactivity disorder (ADHD), predominantly hyperactive type  F90.1       Past Psychiatric History: Long-term outpatient treatment for depression and anxiety  Past Medical History:  Past Medical History:  Diagnosis Date   Abnormal Papanicolaou smear of cervix with positive human papilloma virus (HPV) test 10/11/2018   LSIL +HPV, get colpo___   Abnormal uterine bleeding (AUB) 12/09/2014   Anxiety    Arthritis    Depression    DJD (degenerative joint disease)  Dumping syndrome    Dysmenorrhea 07/15/2014   Fatigue 12/24/2012   Fibromyalgia diagnosed April 2016   Headache    Hx of migraine headaches 12/24/2012   Inappropriate sinus tachycardia (HCC)    Irregular menstrual bleeding 07/15/2014   Pelvic pain in female 03/03/2016   Pneumothorax, spontaneous, tension    Post concussion syndrome    POTS (postural orthostatic tachycardia syndrome)    PTSD (post-traumatic stress disorder)    Scoliosis    Thyroid disease    Tick bite 03/03/2016   Vitamin B 12 deficiency     Past Surgical History:   Procedure Laterality Date   bone spurs toes Right    CESAREAN SECTION     COLONOSCOPY     endooscopy     KNEE SURGERY     rotate cuff lt arm      Family Psychiatric History: See below  Family History:  Family History  Problem Relation Age of Onset   Hypertension Father    Atrial fibrillation Father    Alcohol abuse Father    Cancer Maternal Grandmother        skin    Heart disease Maternal Grandmother    Other Maternal Grandmother        had thyroid removed   Breast cancer Maternal Grandmother    Heart disease Paternal Grandfather    COPD Paternal Grandfather    Hypertension Paternal Grandfather    Diabetes Paternal Grandfather    Stroke Paternal Grandfather    Cancer Maternal Grandfather        bladder,lung   Anxiety disorder Maternal Aunt    Anxiety disorder Maternal Uncle    Bipolar disorder Maternal Uncle    Breast cancer Maternal Uncle        CML   Leukemia Other    Colon cancer Other        lung-2 maternal great uncles    Social History:  Social History   Socioeconomic History   Marital status: Legally Separated    Spouse name: Not on file   Number of children: 3   Years of education: Not on file   Highest education level: Not on file  Occupational History   Not on file  Tobacco Use   Smoking status: Former    Current packs/day: 0.00    Average packs/day: 1 pack/day for 24.5 years (24.5 ttl pk-yrs)    Types: Cigarettes    Start date: 03/13/1997    Quit date: 09/25/2021    Years since quitting: 1.8   Smokeless tobacco: Never   Tobacco comments:    "working on it" Was smoking 2.5 packs a day  Vaping Use   Vaping status: Never Used  Substance and Sexual Activity   Alcohol use: No   Drug use: No   Sexual activity: Yes    Partners: Male    Birth control/protection: Pill  Other Topics Concern   Not on file  Social History Narrative   Not on file   Social Determinants of Health   Financial Resource Strain: High Risk (07/03/2022)   Overall  Financial Resource Strain (CARDIA)    Difficulty of Paying Living Expenses: Very hard  Food Insecurity: Food Insecurity Present (07/03/2022)   Hunger Vital Sign    Worried About Running Out of Food in the Last Year: Sometimes true    Ran Out of Food in the Last Year: Never true  Transportation Needs: No Transportation Needs (07/03/2022)   PRAPARE - Transportation    Lack of Transportation (  Medical): No    Lack of Transportation (Non-Medical): No  Physical Activity: Inactive (01/07/2021)   Exercise Vital Sign    Days of Exercise per Week: 0 days    Minutes of Exercise per Session: 0 min  Stress: Stress Concern Present (07/03/2022)   [employer] of Occupational Health - Occupational Stress Questionnaire    Feeling of Stress : Very much  Social Connections: Moderately Integrated (07/03/2022)   Social Connection and Isolation Panel [NHANES]    Frequency of Communication with Friends and Family: More than three times a week    Frequency of Social Gatherings with Friends and Family: Once a week    Attends Religious Services: 1 to 4 times per year    Active Member of [employer] or Organizations: No    Attends Banker Meetings: Never    Marital Status: Married    Allergies:  Allergies  Allergen Reactions   Prozac [Fluoxetine]     Suicidal thoughts   Topiramate Other (See Comments)    Topamax-dizziness   Lexapro [Escitalopram Oxalate] Other (See Comments)    Flat affect, No emotions    Metabolic Disorder Labs: Lab Results  Component Value Date   HGBA1C 5.4 01/07/2013   MPG 108 01/07/2013   Lab Results  Component Value Date   PROLACTIN 13.2 06/12/2016   Lab Results  Component Value Date   CHOL 204 (H) 01/07/2021   TRIG 132 01/07/2021   HDL 72 01/07/2021   CHOLHDL 2.8 01/07/2021   VLDL 31 01/07/2013   LDLCALC 109 (H) 01/07/2021   LDLCALC 80 10/08/2018   Lab Results  Component Value Date   TSH 4.080 01/07/2021   TSH 1.971 10/24/2019    Therapeutic Level  Labs: Lab Results  Component Value Date   LITHIUM 0.45 (L) 01/27/2020   LITHIUM 0.24 (L) 09/03/2019   No results found for: "VALPROATE" No results found for: "CBMZ"  Current Medications: Current Outpatient Medications  Medication Sig Dispense Refill   Lumateperone Tosylate (CAPLYTA) 21 MG CAPS Take 21 mg by mouth daily. 30 capsule 2   ALPRAZolam (XANAX) 1 MG tablet Take 1 tablet (1 mg total) by mouth 4 (four) times daily. 120 tablet 2   amphetamine-dextroamphetamine (ADDERALL) 20 MG tablet Take 1 tablet (20 mg total) by mouth 3 (three) times daily. Dx code F90.0 ADD 90 tablet 0   amphetamine-dextroamphetamine (ADDERALL) 20 MG tablet Take 1 tablet (20 mg total) by mouth 3 (three) times daily. 90 tablet 0   citalopram (CELEXA) 20 MG tablet Take 1 tablet (20 mg total) by mouth 2 (two) times daily. 60 tablet 2   ibuprofen (ADVIL,MOTRIN) 200 MG tablet Take 400-600 mg by mouth every 6 (six) hours as needed for mild pain or moderate pain.     megestrol (MEGACE) 40 MG tablet 3 tablets a day for 5 days, 2 tablets a day for 5 days then 1 tablet daily 45 tablet 3   ondansetron (ZOFRAN-ODT) 4 MG disintegrating tablet Take 1 tablet (4 mg total) by mouth every 8 (eight) hours as needed. 20 tablet 1   propranolol (INDERAL) 20 MG tablet Take 1 tablet (20 mg total) by mouth 3 (three) times daily. 90 tablet 2   Current Facility-Administered Medications  Medication Dose Route Frequency Provider Last Rate Last Admin   ipratropium (ATROVENT) 0.03 % nasal spray 2 spray  2 spray Each Nare TID Ward, Tylene Fantasia, PA-C         Musculoskeletal: Strength & Muscle Tone: within normal limits Gait &  Station: normal Patient leans: N/A  Psychiatric Specialty Exam: Review of Systems  Constitutional:  Positive for fatigue.  Psychiatric/Behavioral:  Positive for dysphoric mood. The patient is nervous/anxious.   All other systems reviewed and are negative.   Last menstrual period 06/20/2023.There is no height or  weight on file to calculate BMI.  General Appearance: Casual and Fairly Groomed  Eye Contact:  Fair  Speech:  Clear and Coherent  Volume:  Normal  Mood:  Anxious, Dysphoric, and Worthless  Affect:  Flat  Thought Process:  Goal Directed  Orientation:  Full (Time, Place, and Person)  Thought Content: Rumination   Suicidal Thoughts:  No  Homicidal Thoughts:  No  Memory:  Immediate;   Good Recent;   Good Remote;   Good  Judgement:  Fair  Insight:  Shallow  Psychomotor Activity:  Decreased  Concentration:  Concentration: Fair and Attention Span: Fair  Recall:  Good  Fund of Knowledge: Good  Language: Good  Akathisia:  No  Handed:  Right  AIMS (if indicated): not done  Assets:  Communication Skills Desire for Improvement Resilience Social Support  ADL's:  Intact  Cognition: WNL  Sleep:  Good   Screenings: GAD-7    Flowsheet Row Office Visit from 07/03/2022 in [facility] Counselor from 05/31/2021 in [facility] [place] Office Visit from 01/07/2021 in [facility]  Total GAD-7 Score 21 20 16       Mini-Mental    Flowsheet Row Office Visit from 10/25/2016 in [facility]  Total Score (max 30 points ) 29      PHQ2-9    Flowsheet Row Office Visit from 07/03/2022 in [facility] at [facility] Video Visit from 06/29/2022 in [facility] [place] Video Visit from 04/11/2022 in [facility] at [place] Video Visit from 03/03/2022 in [facility] at [place] Video Visit from 02/03/2022 in [facility] at [facility] Total Score 6 1 2 5 6   PHQ-9 Total Score 21 -- 6 13 12       Flowsheet Row ED from 07/10/2023 in [facility] at [facility] ED from 03[facility]13/2023 in [facility] at  [facility] ED from 03/10/2023 in [facility] Emergency Department at [facility]  C-SSRS RISK CATEGORY No Risk No Risk No Risk        Assessment and Plan: This patient is a 42 year old female with a history of depression chronic fatigue social anxiety ADD and borderline personality which I think is turning out to be the primary diagnosis.  She still feels very stressed and on appreciated at home and tends to focus on this.  On the other hand she does not care for herself either.  She denies thoughts of being dangerous to self.  Since she is still complaining of mood swings we will discontinue Abilify and attempt to get Caplyta 21 mg daily approved.  She will continue Celexa 20 mg twice daily for depression, Adderall 20 mg up to 3 times daily for ADD Xanax 1 mg 4 times daily and for anxiety and propranolol 20 mg 3 times daily for anxiety and hypertension.  She will return to see me in 4 weeks  Collaboration of Care: Collaboration of Care: Primary Care Provider AEB notes will be shared with PCP once she determines one.  She  has been reminded to follow-up with her therapist at [facility]  Patient/Guardian was advised Release of Information must be obtained prior to any record release in order to collaborate their care with an outside provider. Patient/Guardian was advised if they have not already done so to contact the registration department to sign all necessary forms in order for [place] to release information regarding their care.   Consent: Patient/Guardian gives verbal consent for treatment and assignment of benefits for services provided during this visit. Patient/Guardian expressed understanding and agreed to proceed.    Diannia Ruder, MD 08/06/2023, 11:17 AM

## 2023-08-08 ENCOUNTER — Ambulatory Visit: Payer: Medicare Other | Admitting: Psychology

## 2023-08-17 ENCOUNTER — Encounter: Payer: Medicare Other | Admitting: Psychology

## 2023-08-17 NOTE — Progress Notes (Signed)
This encounter was created in error - please disregard.

## 2023-08-26 ENCOUNTER — Other Ambulatory Visit (HOSPITAL_COMMUNITY): Payer: Self-pay | Admitting: Psychiatry

## 2023-09-09 ENCOUNTER — Other Ambulatory Visit (HOSPITAL_COMMUNITY): Payer: Self-pay | Admitting: Psychiatry

## 2023-09-19 ENCOUNTER — Ambulatory Visit: Payer: Medicare Other | Admitting: Obstetrics & Gynecology

## 2023-09-21 ENCOUNTER — Emergency Department (HOSPITAL_COMMUNITY): Payer: Medicare Other

## 2023-09-21 ENCOUNTER — Encounter (HOSPITAL_COMMUNITY): Payer: Self-pay | Admitting: Psychiatry

## 2023-09-21 ENCOUNTER — Emergency Department (HOSPITAL_COMMUNITY)
Admission: EM | Admit: 2023-09-21 | Discharge: 2023-09-21 | Disposition: A | Discharge status: Home / Self Care | Payer: Medicare Other | Attending: Emergency Medicine | Admitting: Emergency Medicine

## 2023-09-21 ENCOUNTER — Other Ambulatory Visit: Payer: Self-pay

## 2023-09-21 ENCOUNTER — Telehealth (HOSPITAL_COMMUNITY): Payer: Medicare Other | Admitting: Psychiatry

## 2023-09-21 ENCOUNTER — Encounter (HOSPITAL_COMMUNITY): Payer: Self-pay

## 2023-09-21 DIAGNOSIS — F411 Generalized anxiety disorder: Secondary | ICD-10-CM | POA: Diagnosis not present

## 2023-09-21 DIAGNOSIS — F332 Major depressive disorder, recurrent severe without psychotic features: Secondary | ICD-10-CM | POA: Diagnosis not present

## 2023-09-21 DIAGNOSIS — R002 Palpitations: Secondary | ICD-10-CM | POA: Insufficient documentation

## 2023-09-21 DIAGNOSIS — M797 Fibromyalgia: Secondary | ICD-10-CM | POA: Insufficient documentation

## 2023-09-21 DIAGNOSIS — R0789 Other chest pain: Secondary | ICD-10-CM | POA: Insufficient documentation

## 2023-09-21 DIAGNOSIS — F901 Attention-deficit hyperactivity disorder, predominantly hyperactive type: Secondary | ICD-10-CM

## 2023-09-21 DIAGNOSIS — R079 Chest pain, unspecified: Secondary | ICD-10-CM | POA: Diagnosis not present

## 2023-09-21 DIAGNOSIS — R000 Tachycardia, unspecified: Secondary | ICD-10-CM | POA: Insufficient documentation

## 2023-09-21 DIAGNOSIS — R9431 Abnormal electrocardiogram [ECG] [EKG]: Secondary | ICD-10-CM | POA: Diagnosis not present

## 2023-09-21 DIAGNOSIS — Z20822 Contact with and (suspected) exposure to covid-19: Secondary | ICD-10-CM | POA: Insufficient documentation

## 2023-09-21 LAB — CBC WITH DIFFERENTIAL/PLATELET
Abs Immature Granulocytes: 0.03 10*3/uL (ref 0.00–0.07)
Basophils Absolute: 0.1 10*3/uL (ref 0.0–0.1)
Basophils Relative: 1 %
Eosinophils Absolute: 0 10*3/uL (ref 0.0–0.5)
Eosinophils Relative: 0 %
HCT: 37.8 % (ref 36.0–46.0)
Hemoglobin: 13.7 g/dL (ref 12.0–15.0)
Immature Granulocytes: 0 %
Lymphocytes Relative: 26 %
Lymphs Abs: 2.7 10*3/uL (ref 0.7–4.0)
MCH: 31.7 pg (ref 26.0–34.0)
MCHC: 36.2 g/dL — ABNORMAL HIGH (ref 30.0–36.0)
MCV: 87.5 fL (ref 80.0–100.0)
Monocytes Absolute: 0.5 10*3/uL (ref 0.1–1.0)
Monocytes Relative: 5 %
Neutro Abs: 6.8 10*3/uL (ref 1.7–7.7)
Neutrophils Relative %: 68 %
Platelets: 357 10*3/uL (ref 150–400)
RBC: 4.32 MIL/uL (ref 3.87–5.11)
RDW: 11.9 % (ref 11.5–15.5)
WBC: 10.1 10*3/uL (ref 4.0–10.5)
nRBC: 0 % (ref 0.0–0.2)

## 2023-09-21 LAB — RESP PANEL BY RT-PCR (RSV, FLU A&B, COVID)  RVPGX2
Influenza A by PCR: NEGATIVE
Influenza B by PCR: NEGATIVE
Resp Syncytial Virus by PCR: NEGATIVE
SARS Coronavirus 2 by RT PCR: NEGATIVE

## 2023-09-21 LAB — COMPREHENSIVE METABOLIC PANEL
ALT: 18 U/L (ref 0–44)
AST: 18 U/L (ref 15–41)
Albumin: 4.3 g/dL (ref 3.5–5.0)
Alkaline Phosphatase: 67 U/L (ref 38–126)
Anion gap: 10 (ref 5–15)
BUN: 6 mg/dL (ref 6–20)
CO2: 26 mmol/L (ref 22–32)
Calcium: 9.7 mg/dL (ref 8.9–10.3)
Chloride: 101 mmol/L (ref 98–111)
Creatinine, Ser: 0.72 mg/dL (ref 0.44–1.00)
GFR, Estimated: >60 mL/min (ref >60)
Glucose, Bld: 120 mg/dL — ABNORMAL HIGH (ref 70–99)
Potassium: 3.4 mmol/L — ABNORMAL LOW (ref 3.5–5.1)
Sodium: 137 mmol/L (ref 135–145)
Total Bilirubin: 0.4 mg/dL (ref <1.2)
Total Protein: 7 g/dL (ref 6.5–8.1)

## 2023-09-21 LAB — TROPONIN I (HIGH SENSITIVITY)
Troponin I (High Sensitivity): <2 ng/L (ref <18)
Troponin I (High Sensitivity): <2 ng/L (ref <18)

## 2023-09-21 LAB — HCG, QUANTITATIVE, PREGNANCY: hCG, Beta Chain, Quant, S: <1 m[IU]/mL (ref <5)

## 2023-09-21 MED ORDER — SODIUM CHLORIDE 0.9 % IV BOLUS
1000.0000 mL | Freq: Once | INTRAVENOUS | Status: AC
  Administered 2023-09-21: 1000 mL via INTRAVENOUS

## 2023-09-21 MED ORDER — ASPIRIN 81 MG PO CHEW
162.0000 mg | CHEWABLE_TABLET | Freq: Once | ORAL | Status: AC
  Administered 2023-09-21: 162 mg via ORAL
  Filled 2023-09-21: qty 2

## 2023-09-21 MED ORDER — CARBAMAZEPINE 200 MG PO TABS: 200.0000 mg | ORAL_TABLET | Freq: Two times a day (BID) | ORAL | 2 refills | Status: DC

## 2023-09-21 MED ORDER — CITALOPRAM HYDROBROMIDE 20 MG PO TABS: 20.0000 mg | ORAL_TABLET | Freq: Two times a day (BID) | ORAL | 2 refills | Status: DC

## 2023-09-21 MED ORDER — AMPHETAMINE-DEXTROAMPHETAMINE 20 MG PO TABS: 20.0000 mg | ORAL_TABLET | Freq: Three times a day (TID) | ORAL | 0 refills | Status: DC

## 2023-09-21 MED ORDER — ALPRAZOLAM 1 MG PO TABS: 1.0000 mg | ORAL_TABLET | Freq: Four times a day (QID) | ORAL | 2 refills | Status: DC

## 2023-09-21 NOTE — Discharge Instructions (Signed)
You were seen in the emergency room for palpitations and chest discomfort Your heart rate was normal here and we watched you on the monitor for several hours Your EKG looked okay to We are not certain what is causing your symptoms but your blood work including cardiac enzymes looked okay It is important that you follow-up with your primary care doctor to discuss the visit and additional testing to monitor your heart at home Return to the emerged part for chest pain trouble breathing or any other concerns

## 2023-09-21 NOTE — ED Provider Notes (Signed)
[place] EMERGENCY DEPARTMENT AT [facility] Provider Note   CSN: [id number] Arrival date & time: 09/21/23  1439     History  Chief Complaint  Patient presents with   Palpitations    Destiny Orr is a 42 y.o. female.  With a history of idiopathic sinus tachycardia, POTS disease and fibromyalgia who presents to the ED for palpitations.  Patient states her heart rate typically ranges in the 100s up to 130s on a normal day.  She became concerned today when she began to have palpitations and her heart rate was noted to be much lower in the 50s at home.  Chest discomfort had a sharp quality made worse with deep breath.  No shortness of breath at rest.  No fevers chills recent illness diaphoresis nausea or vomiting.  Denies drug and alcohol use.  No prescription medication changes recently   Palpitations      Home Medications Prior to Admission medications   Medication Sig Start Date End Date Taking? Authorizing Provider  ALPRAZolam Prudy Feeler) 1 MG tablet Take 1 tablet (1 mg total) by mouth 4 (four) times daily. 09/21/23 09/20/24  Myrlene Broker, MD  amphetamine-dextroamphetamine (ADDERALL) 20 MG tablet Take 1 tablet (20 mg total) by mouth 3 (three) times daily. 08/06/23 08/05/24  Myrlene Broker, MD  amphetamine-dextroamphetamine (ADDERALL) 20 MG tablet Take 1 tablet (20 mg total) by mouth 3 (three) times daily. Dx code F90.0 ADD 09/21/23   Myrlene Broker, MD  carbamazepine (TEGRETOL) 200 MG tablet Take 1 tablet (200 mg total) by mouth 2 (two) times daily. 09/21/23   Myrlene Broker, MD  citalopram (CELEXA) 20 MG tablet Take 1 tablet (20 mg total) by mouth 2 (two) times daily. 09/21/23 09/20/24  Myrlene Broker, MD  ibuprofen (ADVIL,MOTRIN) 200 MG tablet Take 400-600 mg by mouth every 6 (six) hours as needed for mild pain or moderate pain.    [provider]  megestrol (MEGACE) 40 MG tablet 3 tablets a day for 5 days, 2 tablets a day for 5 days then 1 tablet daily 08/29/22    Lazaro Arms, MD  ondansetron (ZOFRAN-ODT) 4 MG disintegrating tablet Take 1 tablet (4 mg total) by mouth every 8 (eight) hours as needed. 02/23/23   Margaretann Loveless, PA-C      Allergies    Prozac [fluoxetine], Topiramate, and Lexapro [escitalopram oxalate]    Review of Systems   Review of Systems  Cardiovascular:  Positive for palpitations.    Physical Exam Updated Vital Signs BP 119/82   Pulse 77   Temp 99 F (37.2 C) (Oral)   Resp 11   Ht 5\' 5"  (1.651 m)   Wt 66.2 kg   SpO2 100%   BMI 24.30 kg/m  Physical Exam Vitals and nursing note reviewed.  HENT:     Head: Normocephalic and atraumatic.  Eyes:     Pupils: Pupils are equal, round, and reactive to light.  Cardiovascular:     Rate and Rhythm: Normal rate and regular rhythm.  Pulmonary:     Effort: Pulmonary effort is normal.     Breath sounds: Normal breath sounds.  Abdominal:     Palpations: Abdomen is soft.     Tenderness: There is no abdominal tenderness.  Skin:    General: Skin is warm and dry.  Neurological:     Mental Status: She is alert.  Psychiatric:        Mood and Affect: Mood normal.  ED Results / Procedures / Treatments   Labs (all labs ordered are listed, but only abnormal results are displayed) Labs Reviewed  CBC WITH DIFFERENTIAL/PLATELET - Abnormal; Notable for the following components:      Result Value   MCHC 36.2 (*)    All other components within normal limits  COMPREHENSIVE METABOLIC PANEL - Abnormal; Notable for the following components:   Potassium 3.4 (*)    Glucose, Bld 120 (*)    All other components within normal limits  RESP PANEL BY RT-PCR (RSV, FLU A&B, COVID)  [id number]  HCG, QUANTITATIVE, PREGNANCY  TROPONIN I (HIGH SENSITIVITY)  TROPONIN I (HIGH SENSITIVITY)    EKG EKG Interpretation Date/Time:  Friday September 21 2023 15:06:03 EST Ventricular Rate:  112 PR Interval:  144 QRS Duration:  78 QT Interval:  312 QTC Calculation: 425 R Axis:   80  Text  Interpretation: Sinus tachycardia Possible Left atrial enlargement Nonspecific ST and T wave abnormality Abnormal ECG When compared with ECG of 11-Jul-2018 13:44, No significant change was found Confirmed by Estelle June [phone]) on 09/21/2023 4:16:56 PM  Radiology DG Chest 2 View Result Date: 09/21/2023 CLINICAL DATA:  Increased heart rate EXAM: CHEST - 2 VIEW COMPARISON:  Chest x-ray July 11, 2018 FINDINGS: The cardiomediastinal silhouette is unchanged in contour. No focal pulmonary opacity. No pleural effusion or pneumothorax. The visualized upper abdomen is unremarkable. No acute osseous abnormality. IMPRESSION: No active cardiopulmonary disease. Electronically Signed   By: Jacob Moores M.D.   On: 09/21/2023 16:29    Procedures Procedures    Medications Ordered in ED Medications  sodium chloride 0.9 % bolus 1,000 mL (0 mLs Intravenous Stopped 09/21/23 1802)  aspirin chewable tablet 162 mg (162 mg Oral Given 09/21/23 1645)    ED Course/ Medical Decision Making/ A&P Clinical Course as of 09/21/23 1851  Fri Sep 21, 2023  1845 High-sensitivity troponin of less than 2 not consistent with ACS.  No dysrhythmia or ischemic changes on EKG.  COVID/influenza/RSV all negative.  No significant electrolyte derangements on CMP, CBC unremarkable.  Pregnancy test is negative.  Patient has remained hemodynamically stable.  Normal sinus rhythm on monitor.  Tylenol for headache. [MP]    Clinical Course User Index [MP] [name], DO                                 Medical Decision Making 42 year old female with history as above presenting for palpitations and chest pain today.  Has had similar episodes in the past and has been diagnosed with idiopathic sinus tachycardia.  Became concerned when her heart rate was lower than normal today no shortness of breath at rest.  Benign physical exam.  Differential diagnosis includes ACS, dysrhythmia, viral URI, pneumonia.  Has a history of  tachycardia.  He is not tachycardic on my evaluation breathing comfortably without hypoxia.  Lower suspicion for pulmonary embolism.  Will provide IV fluids to see if this helps with her palpitations and continue to monitor on telemetry.  She took 281 mg aspirin prior to arrival.  Will give her another 2X here  Amount and/or Complexity of Data Reviewed Labs: ordered. Radiology: ordered. ECG/medicine tests: ordered.  Risk OTC drugs.           Final Clinical Impression(s) / ED Diagnoses Final diagnoses:  Palpitations    Rx / DC Orders ED Discharge Orders     None  [name], DO 09/21/23 1851

## 2023-09-21 NOTE — ED Triage Notes (Signed)
Pt stated that her HR has been low for several days and now feels a pounding in her chest.   HR 130 in triage  Sharp pain in chest Hurts more to breathe

## 2023-09-21 NOTE — Progress Notes (Signed)
Virtual Visit via Telephone Note  I connected with Destiny Orr on 09/21/23 at 10:40 AM EST by telephone and verified that I am speaking with the correct person using two identifiers.  Location: Patient: home Provider: office   I discussed the limitations, risks, security and privacy concerns of performing an evaluation and management service by telephone and the availability of in person appointments. I also discussed with the patient that there may be a patient responsible charge related to this service. The patient expressed understanding and agreed to proceed.       I discussed the assessment and treatment plan with the patient. The patient was provided an opportunity to ask questions and all were answered. The patient agreed with the plan and demonstrated an understanding of the instructions.   The patient was advised to call back or seek an in-person evaluation if the symptoms worsen or if the condition fails to improve as anticipated.  I provided 20 minutes of non-face-to-face time during this encounter.   Diannia Ruder, MD  [facility] MD/PA/NP OP Progress Note  09/21/2023 11:13 AM Destiny Orr  MRN:  [id number]  Chief Complaint:  Chief Complaint  Patient presents with   Depression   Anxiety   Follow-up   HPI: This patient is a 42 year old separated white female who lives with her ex-husband 1 son and 1 daughter her older daughter and baby in [place]. She is on disability.   The patient returns for follow-up after about 4 weeks regarding her depression anxiety and ADHD.  Last time she states that she felt lightheaded and this has persisted.  She has stopped the propranolol but her pulse is still under 60.  She states that she feels fatigue all the time and tingling in her hands.  She tried to get in with endocrinology but needed a referral also now she is going to go to the emergency room today.  The patient tried Caplyta but it made her feel "numb" so she has stopped it.  She is  still taking Tegretol which is helps to some degree with her mood as well as Celexa.  The Xanax continues to help her anxiety.  She is focusing fairly well with her Adderall.  She is spending a lot of time taking care of her granddaughter and somewhat resents is because her daughter is not taking care of her own child in her opinion.  She is currently not seeing a therapist because she seems to find difficulties with all the 1 she has seen so far.  I explained that if she is having all these physical symptoms she needs to have that looked at as soon as possible before we can make any other medication changes and she agrees Visit Diagnosis:    ICD-10-CM   1. Major depressive disorder, recurrent, severe without psychotic features (HCC)  F33.2     2. GAD (generalized anxiety disorder)  F41.1     3. Attention deficit hyperactivity disorder (ADHD), predominantly hyperactive type  F90.1       Past Psychiatric History: Long-term outpatient treatment for depression and anxiety  Past Medical History:  Past Medical History:  Diagnosis Date   Abnormal Papanicolaou smear of cervix with positive human papilloma virus (HPV) test 10/11/2018   LSIL +HPV, get colpo___   Abnormal uterine bleeding (AUB) 12/09/2014   Anxiety    Arthritis    Depression    DJD (degenerative joint disease)    Dumping syndrome    Dysmenorrhea 07/15/2014   Fatigue 12/24/2012  Fibromyalgia diagnosed April 2016   Headache    Hx of migraine headaches 12/24/2012   Inappropriate sinus tachycardia (HCC)    Irregular menstrual bleeding 07/15/2014   Pelvic pain in female 03/03/2016   Pneumothorax, spontaneous, tension    Post concussion syndrome    POTS (postural orthostatic tachycardia syndrome)    PTSD (post-traumatic stress disorder)    Scoliosis    Thyroid disease    Tick bite 03/03/2016   Vitamin B 12 deficiency     Past Surgical History:  Procedure Laterality Date   bone spurs toes Right    CESAREAN SECTION     COLONOSCOPY      endooscopy     KNEE SURGERY     rotate cuff lt arm      Family Psychiatric History: See below  Family History:  Family History  Problem Relation Age of Onset   Hypertension Father    Atrial fibrillation Father    Alcohol abuse Father    Cancer Maternal Grandmother        skin    Heart disease Maternal Grandmother    Other Maternal Grandmother        had thyroid removed   Breast cancer Maternal Grandmother    Heart disease Paternal Grandfather    COPD Paternal Grandfather    Hypertension Paternal Grandfather    Diabetes Paternal Grandfather    Stroke Paternal Grandfather    Cancer Maternal Grandfather        bladder,lung   Anxiety disorder Maternal Aunt    Anxiety disorder Maternal Uncle    Bipolar disorder Maternal Uncle    Breast cancer Maternal Uncle        CML   Leukemia Other    Colon cancer Other        lung-2 maternal great uncles    Social History:  Social History   Socioeconomic History   Marital status: Legally Separated    Spouse name: Not on file   Number of children: 3   Years of education: Not on file   Highest education level: Not on file  Occupational History   Not on file  Tobacco Use   Smoking status: Former    Current packs/day: 0.00    Average packs/day: 1 pack/day for 24.5 years (24.5 ttl pk-yrs)    Types: Cigarettes    Start date: 03/13/1997    Quit date: 09/25/2021    Years since quitting: 1.9   Smokeless tobacco: Never   Tobacco comments:    "working on it" Was smoking 2.5 packs a day  Vaping Use   Vaping status: Never Used  Substance and Sexual Activity   Alcohol use: No   Drug use: No   Sexual activity: Yes    Partners: Male    Birth control/protection: Pill  Other Topics Concern   Not on file  Social History Narrative   Not on file   Social Drivers of Health   Financial Resource Strain: High Risk (07/03/2022)   Overall Financial Resource Strain (CARDIA)    Difficulty of Paying Living Expenses: Very hard  Food  Insecurity: Food Insecurity Present (07/03/2022)   Hunger Vital Sign    Worried About Running Out of Food in the Last Year: Sometimes true    Ran Out of Food in the Last Year: Never true  Transportation Needs: No Transportation Needs (07/03/2022)   PRAPARE - Administrator, Civil Service (Medical): No    Lack of Transportation (Non-Medical): No  Physical Activity:  Inactive (01/07/2021)   Exercise Vital Sign    Days of Exercise per Week: 0 days    Minutes of Exercise per Session: 0 min  Stress: Stress Concern Present (07/03/2022)   [employer] of Occupational Health - Occupational Stress Questionnaire    Feeling of Stress : Very much  Social Connections: Moderately Integrated (07/03/2022)   Social Connection and Isolation Panel [NHANES]    Frequency of Communication with Friends and Family: More than three times a week    Frequency of Social Gatherings with Friends and Family: Once a week    Attends Religious Services: 1 to 4 times per year    Active Member of [employer] or Organizations: No    Attends Banker Meetings: Never    Marital Status: Married    Allergies:  Allergies  Allergen Reactions   Prozac [Fluoxetine]     Suicidal thoughts   Topiramate Other (See Comments)    Topamax-dizziness   Lexapro [Escitalopram Oxalate] Other (See Comments)    Flat affect, No emotions    Metabolic Disorder Labs: Lab Results  Component Value Date   HGBA1C 5.4 01/07/2013   MPG 108 01/07/2013   Lab Results  Component Value Date   PROLACTIN 13.2 06/12/2016   Lab Results  Component Value Date   CHOL 204 (H) 01/07/2021   TRIG 132 01/07/2021   HDL 72 01/07/2021   CHOLHDL 2.8 01/07/2021   VLDL 31 01/07/2013   LDLCALC 109 (H) 01/07/2021   LDLCALC 80 10/08/2018   Lab Results  Component Value Date   TSH 4.080 01/07/2021   TSH 1.971 10/24/2019    Therapeutic Level Labs: Lab Results  Component Value Date   LITHIUM 0.45 (L) 01/27/2020   LITHIUM 0.24 (L)  09/03/2019   No results found for: "VALPROATE" No results found for: "CBMZ"  Current Medications: Current Outpatient Medications  Medication Sig Dispense Refill   ALPRAZolam (XANAX) 1 MG tablet Take 1 tablet (1 mg total) by mouth 4 (four) times daily. 120 tablet 2   amphetamine-dextroamphetamine (ADDERALL) 20 MG tablet Take 1 tablet (20 mg total) by mouth 3 (three) times daily. 90 tablet 0   amphetamine-dextroamphetamine (ADDERALL) 20 MG tablet Take 1 tablet (20 mg total) by mouth 3 (three) times daily. Dx code F90.0 ADD 90 tablet 0   carbamazepine (TEGRETOL) 200 MG tablet Take 1 tablet (200 mg total) by mouth 2 (two) times daily. 60 tablet 2   citalopram (CELEXA) 20 MG tablet Take 1 tablet (20 mg total) by mouth 2 (two) times daily. 60 tablet 2   ibuprofen (ADVIL,MOTRIN) 200 MG tablet Take 400-600 mg by mouth every 6 (six) hours as needed for mild pain or moderate pain.     megestrol (MEGACE) 40 MG tablet 3 tablets a day for 5 days, 2 tablets a day for 5 days then 1 tablet daily 45 tablet 3   ondansetron (ZOFRAN-ODT) 4 MG disintegrating tablet Take 1 tablet (4 mg total) by mouth every 8 (eight) hours as needed. 20 tablet 1   Current Facility-Administered Medications  Medication Dose Route Frequency Provider Last Rate Last Admin   ipratropium (ATROVENT) 0.03 % nasal spray 2 spray  2 spray Each Nare TID Ward, Tylene Fantasia, PA-C         Musculoskeletal: Strength & Muscle Tone: na Gait & Station: na Patient leans: N/A  Psychiatric Specialty Exam: Review of Systems  Constitutional:  Positive for fatigue.  Neurological:  Positive for light-headedness.  All other systems reviewed and are negative.  There were no vitals taken for this visit.There is no height or weight on file to calculate BMI.  General Appearance: NA  Eye Contact:  NA  Speech:  Clear and Coherent  Volume:  Normal  Mood:  Dysphoric  Affect:  NA  Thought Process:  Goal Directed  Orientation:  Full (Time, Place, and  Person)  Thought Content: Rumination   Suicidal Thoughts:  No  Homicidal Thoughts:  No  Memory:  Immediate;   Good Recent;   Good Remote;   Fair  Judgement:  Fair  Insight:  Fair  Psychomotor Activity:  Decreased  Concentration:  Concentration: Good and Attention Span: Good  Recall:  Good  Fund of Knowledge: Good  Language: Good  Akathisia:  No  Handed:  Right  AIMS (if indicated): not done  Assets:  Communication Skills Desire for Improvement Resilience Social Support  ADL's:  Intact  Cognition: WNL  Sleep:  Good   Screenings: GAD-7    Flowsheet Row [facility] Visit [facility] in [facility] Counselor from 05/31/2021 in [facility] [place] Office Visit from 01/07/2021 in [facility] at [facility]  Total GAD-7 Score 21 20 16       Mini-Mental    Flowsheet Row Office Visit from 10/25/2016 in [facility]  Total Score (max 30 points ) 29      PHQ2-9    Flowsheet Row Office Visit from 07/03/2022 in [facility] at [facility] Video Visit from 06/29/2022 in [facility] [place] Video Visit from 04/11/2022 in [facility] at [place] Video Visit from 03/03/2022 in [facility] at [place] Video Visit [facility] Total Score 6 1 2 5 6   PHQ-9 Total Score 21 -- 6 13 12       Flowsheet Row ED from 07/10/2023 in [facility] at [facility] ED from 03/13/2023 in [facility] ED from 03/10/2023 in [facility] Emergency Department at [facility]  C-SSRS RISK CATEGORY No Risk No Risk No Risk        Assessment and Plan: This patient is a 42 year old female with a history of depression chronic fatigue social anxiety ADD and borderline  personality disorder.  No matter what mood stabilizer we try nothing seems to be helpful.  Now she is having a lot of physical symptoms and with her history of hypothyroidism I asked her to get this checked out.  She we will continue Celexa 20 mg twice daily for depression, Adderall 20 mg up to 3 times daily for ADHD, Xanax 1 mg 4 times daily for anxiety and Tegretol 200 mg twice daily for mood stabilization.  She will return to see me in 4 weeks  Collaboration of Care: Collaboration of Care: Primary Care Provider AEB notes will be shared with PCP at patient's request if the patient gets her own's PCP  Patient/Guardian was advised Release of Information must be obtained prior to any record release in order to collaborate their care with an outside provider. Patient/Guardian was advised if they have not already done so to contact the registration department to sign all necessary forms in order for [place] to release information regarding their care.   Consent: Patient/Guardian gives verbal consent for treatment and assignment of benefits for services provided during this  visit. Patient/Guardian expressed understanding and agreed to proceed.    Diannia Ruder, MD 09/21/2023, 11:13 AM

## 2023-10-01 ENCOUNTER — Ambulatory Visit (INDEPENDENT_AMBULATORY_CARE_PROVIDER_SITE_OTHER): Payer: Medicare Other | Admitting: Urgent Care

## 2023-10-01 ENCOUNTER — Other Ambulatory Visit: Payer: Self-pay | Admitting: Urgent Care

## 2023-10-01 ENCOUNTER — Encounter: Payer: Self-pay | Admitting: Urgent Care

## 2023-10-01 ENCOUNTER — Other Ambulatory Visit (HOSPITAL_COMMUNITY): Payer: Self-pay | Admitting: Psychiatry

## 2023-10-01 VITALS — BP 142/103 | HR 92 | Temp 98.9°F | Ht 66.0 in | Wt 152.8 lb

## 2023-10-01 DIAGNOSIS — M25551 Pain in right hip: Secondary | ICD-10-CM

## 2023-10-01 DIAGNOSIS — F419 Anxiety disorder, unspecified: Secondary | ICD-10-CM

## 2023-10-01 DIAGNOSIS — R5382 Chronic fatigue, unspecified: Secondary | ICD-10-CM | POA: Diagnosis not present

## 2023-10-01 DIAGNOSIS — R5381 Other malaise: Secondary | ICD-10-CM

## 2023-10-01 DIAGNOSIS — E538 Deficiency of other specified B group vitamins: Secondary | ICD-10-CM

## 2023-10-01 DIAGNOSIS — K30 Functional dyspepsia: Secondary | ICD-10-CM

## 2023-10-01 DIAGNOSIS — E039 Hypothyroidism, unspecified: Secondary | ICD-10-CM

## 2023-10-01 DIAGNOSIS — E041 Nontoxic single thyroid nodule: Secondary | ICD-10-CM

## 2023-10-01 DIAGNOSIS — E559 Vitamin D deficiency, unspecified: Secondary | ICD-10-CM

## 2023-10-01 DIAGNOSIS — M419 Scoliosis, unspecified: Secondary | ICD-10-CM

## 2023-10-01 DIAGNOSIS — R002 Palpitations: Secondary | ICD-10-CM

## 2023-10-01 DIAGNOSIS — G90A Postural orthostatic tachycardia syndrome (POTS): Secondary | ICD-10-CM

## 2023-10-01 LAB — COMPREHENSIVE METABOLIC PANEL
ALT: 14 U/L (ref 0–35)
AST: 13 U/L (ref 0–37)
Albumin: 4.8 g/dL (ref 3.5–5.2)
Alkaline Phosphatase: 66 U/L (ref 39–117)
BUN: 7 mg/dL (ref 6–23)
CO2: 27 meq/L (ref 19–32)
Calcium: 9.7 mg/dL (ref 8.4–10.5)
Chloride: 103 meq/L (ref 96–112)
Creatinine, Ser: 0.65 mg/dL (ref 0.40–1.20)
GFR: 108.5 mL/min (ref >60.00)
Glucose, Bld: 94 mg/dL (ref 70–99)
Potassium: 4.7 meq/L (ref 3.5–5.1)
Sodium: 138 meq/L (ref 135–145)
Total Bilirubin: 0.3 mg/dL (ref 0.2–1.2)
Total Protein: 7.1 g/dL (ref 6.0–8.3)

## 2023-10-01 LAB — CBC WITH DIFFERENTIAL/PLATELET
Basophils Absolute: 0.1 10*3/uL (ref 0.0–0.1)
Basophils Relative: 0.7 % (ref 0.0–3.0)
Eosinophils Absolute: 0 10*3/uL (ref 0.0–0.7)
Eosinophils Relative: 0.3 % (ref 0.0–5.0)
HCT: 42.2 % (ref 36.0–46.0)
Hemoglobin: 14.5 g/dL (ref 12.0–15.0)
Lymphocytes Relative: 28 % (ref 12.0–46.0)
Lymphs Abs: 2.3 10*3/uL (ref 0.7–4.0)
MCHC: 34.3 g/dL (ref 30.0–36.0)
MCV: 90.9 fL (ref 78.0–100.0)
Monocytes Absolute: 0.6 10*3/uL (ref 0.1–1.0)
Monocytes Relative: 7.8 % (ref 3.0–12.0)
Neutro Abs: 5.2 10*3/uL (ref 1.4–7.7)
Neutrophils Relative %: 63.2 % (ref 43.0–77.0)
Platelets: 315 10*3/uL (ref 150.0–400.0)
RBC: 4.64 Mil/uL (ref 3.87–5.11)
RDW: 13.1 % (ref 11.5–15.5)
WBC: 8.3 10*3/uL (ref 4.0–10.5)

## 2023-10-01 LAB — VITAMIN D 25 HYDROXY (VIT D DEFICIENCY, FRACTURES): VITD: 26.34 ng/mL — ABNORMAL LOW (ref 30.00–100.00)

## 2023-10-01 LAB — HEMOGLOBIN A1C: Hgb A1c MFr Bld: 5.5 % (ref 4.6–6.5)

## 2023-10-01 LAB — SEDIMENTATION RATE: Sed Rate: 6 mm/h (ref 0–20)

## 2023-10-01 LAB — B12 AND FOLATE PANEL
Folate: 7.7 ng/mL (ref >5.9)
Vitamin B-12: 211 pg/mL (ref 211–911)

## 2023-10-01 LAB — T4, FREE: Free T4: 0.62 ng/dL (ref 0.60–1.60)

## 2023-10-01 LAB — TSH: TSH: 0.65 u[IU]/mL (ref 0.35–5.50)

## 2023-10-01 MED ORDER — VITAMIN D (ERGOCALCIFEROL) 1.25 MG (50000 UNIT) PO CAPS: 50000.0000 [IU] | ORAL_CAPSULE | ORAL | 0 refills | Status: DC

## 2023-10-02 DIAGNOSIS — M25551 Pain in right hip: Secondary | ICD-10-CM | POA: Insufficient documentation

## 2023-10-02 DIAGNOSIS — K30 Functional dyspepsia: Secondary | ICD-10-CM | POA: Insufficient documentation

## 2023-10-02 DIAGNOSIS — M419 Scoliosis, unspecified: Secondary | ICD-10-CM | POA: Insufficient documentation

## 2023-10-02 NOTE — Assessment & Plan Note (Signed)
All of patients current medications come from her psychiatrist. She follows with them monthly.

## 2023-10-02 NOTE — Assessment & Plan Note (Signed)
 History of B12 and Vitamin D deficiency.  Currently taking supplements at home. Last labs checked in 2017. -Check B12 and Vitamin D levels. If low, consider changing to sublingual or IM B12 and prescription strength Vitamin D.

## 2023-10-02 NOTE — Assessment & Plan Note (Signed)
 History of scoliosis with associated chronic back pain and muscle tightness. Prior treatment with muscle relaxers and gabapentin . Pt does not want to repeat Rx at this time as they were ineffective in the past. -Refer to physical therapy for functional assessment and potential treatments such as iontophoresis or dry needling.

## 2023-10-02 NOTE — Assessment & Plan Note (Signed)
Right Hip Pain Chronic right hip pain, potentially related to back issues. -Refer to physical therapy for functional assessment and potential treatments.

## 2023-10-02 NOTE — Assessment & Plan Note (Signed)
 Postural Orthostatic Tachycardia Syndrome (POTS) History of POTS with recent episodes of bradycardia and associated symptoms of fatigue, shakiness, and dyspnea. -Refer to cardiology for further evaluation and management. -consider repeat zio patch, however pt states this was already completed in her past workups

## 2023-10-02 NOTE — Assessment & Plan Note (Signed)
 Fatigue and Night Sweats Chronic fatigue (dx in 2014) worsening over the past year with associated night sweats. No clear etiology identified yet. -Order comprehensive lab work including CBC, CMP, ESR, B12, and Vitamin D levels.

## 2023-10-02 NOTE — Assessment & Plan Note (Signed)
 History of thyroid  nodule with associated symptoms of ear pressure and sensation of a knot in the neck. Last ultrasound in 2020 showed a solitary nodule in the right lobe, unchanged from prior (2016). No follow-up ultrasound in 2021 was performed, as recommended. -Order thyroid  ultrasound. -Update thyroid  labs.

## 2023-10-02 NOTE — Assessment & Plan Note (Addendum)
 History of rapid gastric emptying with associated loose stools (I suspect dumping syndrome?) and dietary changes due to frustration with frequent bowel movements. Prior treatment with Bentyl was not effective due to inability to absorb pill form. -Monitor for now. Consider trial with anaspaz  SL or even a short course of acarbose to see if sx resolve.

## 2023-10-04 ENCOUNTER — Other Ambulatory Visit: Payer: Self-pay | Admitting: Urgent Care

## 2023-10-04 ENCOUNTER — Other Ambulatory Visit (HOSPITAL_COMMUNITY): Payer: Self-pay | Admitting: Psychiatry

## 2023-10-04 ENCOUNTER — Encounter: Payer: Self-pay | Admitting: Urgent Care

## 2023-10-04 DIAGNOSIS — R768 Other specified abnormal immunological findings in serum: Secondary | ICD-10-CM

## 2023-10-04 DIAGNOSIS — M2550 Pain in unspecified joint: Secondary | ICD-10-CM

## 2023-10-05 ENCOUNTER — Other Ambulatory Visit: Payer: Self-pay | Admitting: Urgent Care

## 2023-10-05 LAB — TIER 2
Jo-1 Autoabs: <1 AI
SSA (Ro) (ENA) Antibody, IgG: <1 AI
SSB (La) (ENA) Antibody, IgG: <1 AI
Scleroderma (Scl-70) (ENA) Antibody, IgG: <1 AI

## 2023-10-05 LAB — TIER 1
Chromatin (Nucleosomal) Antibody: <1 AI
ENA SM Ab Ser-aCnc: <1 AI
Ribonucleic Protein(ENA) Antibody, IgG: <1 AI
SM/RNP: <1 AI
ds DNA Ab: 1 [IU]/mL

## 2023-10-05 LAB — TIER 3
Centromere Ab Screen: <1 AI
Ribosomal P Protein Ab: <1 AI

## 2023-10-05 LAB — ANTI-NUCLEAR AB-TITER (ANA TITER): ANA Titer 1: 1:40 {titer} — ABNORMAL HIGH

## 2023-10-05 LAB — INTERPRETATION

## 2023-10-05 LAB — ANA SCREEN,IFA,REFLEX TITER/PATTERN,REFLEX MPLX 11 AB CASCADE
Anti Nuclear Antibody (ANA): POSITIVE — AB
Cyclic Citrullin Peptide Ab: <16 U
MUTATED CITRULLINATED VIMENTIN (MCV) AB: <20 U/mL (ref <20)
Rheumatoid fact SerPl-aCnc: <10 [IU]/mL (ref <14)

## 2023-10-05 MED ORDER — MELOXICAM 7.5 MG PO TABS
7.5000 mg | ORAL_TABLET | Freq: Every day | ORAL | 0 refills | Status: DC
Start: 2023-10-05 — End: 2024-02-28

## 2023-10-05 MED ORDER — HYOSCYAMINE SULFATE 0.125 MG PO TABS: 0.1250 mg | ORAL_TABLET | Freq: Four times a day (QID) | ORAL | 3 refills | Status: AC | PRN

## 2023-10-09 ENCOUNTER — Telehealth (HOSPITAL_COMMUNITY): Payer: Self-pay | Admitting: *Deleted

## 2023-10-09 ENCOUNTER — Other Ambulatory Visit (HOSPITAL_COMMUNITY): Payer: Self-pay | Admitting: Obstetrics & Gynecology

## 2023-10-09 ENCOUNTER — Telehealth: Payer: Self-pay

## 2023-10-09 DIAGNOSIS — Z1231 Encounter for screening mammogram for malignant neoplasm of breast: Secondary | ICD-10-CM

## 2023-10-09 NOTE — Telephone Encounter (Signed)
 Pt called and wanted to see when she needs to schedule a nurse visit for B12 Injections. She also stated that meloxicam has not helped her pain  much and she was prescribed Gabapentin in the past and it does work better. Please advise

## 2023-10-09 NOTE — Telephone Encounter (Signed)
 Please call pharmacy to see when these were last filled.

## 2023-10-09 NOTE — Telephone Encounter (Signed)
 Per pt she went to [place] to renew a dance movement psychotherapist and when she came back home, she noticed her medication were gone. Per pt she keeps her medications on her in her purse. Per pt they have looked everywhere for her medications to see if it's at the house and they can not find it. Per pt the medications from Dr. Okey that's missing is Celexa  and Xanax .

## 2023-10-10 ENCOUNTER — Encounter: Payer: Self-pay | Admitting: Urgent Care

## 2023-10-10 ENCOUNTER — Telehealth: Payer: Self-pay

## 2023-10-10 ENCOUNTER — Other Ambulatory Visit: Payer: Self-pay | Admitting: Urgent Care

## 2023-10-10 ENCOUNTER — Other Ambulatory Visit (HOSPITAL_COMMUNITY): Payer: Self-pay | Admitting: Psychiatry

## 2023-10-10 MED ORDER — GABAPENTIN 100 MG PO CAPS: ORAL_CAPSULE | ORAL | 2 refills | Status: DC

## 2023-10-10 MED ORDER — ALPRAZOLAM 1 MG PO TABS: 1.0000 mg | ORAL_TABLET | Freq: Four times a day (QID) | ORAL | 0 refills | Status: DC

## 2023-10-10 MED ORDER — CITALOPRAM HYDROBROMIDE 20 MG PO TABS: 20.0000 mg | ORAL_TABLET | Freq: Two times a day (BID) | ORAL | 2 refills | Status: DC

## 2023-10-10 NOTE — Telephone Encounter (Signed)
 Called patient pharmacy and spoke with Lauren. Per Leotis Shames, patient last picked up her Xanax 1mg  on January 2nd at 10:16am.

## 2023-10-10 NOTE — Telephone Encounter (Signed)
 Pt advised.

## 2023-10-10 NOTE — Telephone Encounter (Signed)
 Informed patient and she verbalized understanding and stated she will get that done but is not at home to get it done today.

## 2023-10-10 NOTE — Telephone Encounter (Signed)
 I have called in gabapentin for her to take. She needs to start at 100mg  daily and we will titrate up based upon her response and tolerance. She can come whenever she is able for the injections - we start with once weekly x 4 weeks, then go to monthly.

## 2023-10-10 NOTE — Telephone Encounter (Signed)
 Pt sent my chart message stating that hyoscyamine was not covered by insurance and wanted to see if there is something else that may be covered. Pt advised to check with her insurance also to see what is covered.

## 2023-10-11 ENCOUNTER — Ambulatory Visit (HOSPITAL_COMMUNITY)
Admission: RE | Admit: 2023-10-11 | Discharge: 2023-10-11 | Disposition: A | Discharge status: Home / Self Care | Payer: Medicare Other | Source: Ambulatory Visit | Attending: Urgent Care | Admitting: Urgent Care

## 2023-10-11 DIAGNOSIS — E039 Hypothyroidism, unspecified: Secondary | ICD-10-CM | POA: Insufficient documentation

## 2023-10-11 DIAGNOSIS — E041 Nontoxic single thyroid nodule: Secondary | ICD-10-CM | POA: Diagnosis present

## 2023-10-12 ENCOUNTER — Ambulatory Visit: Payer: Medicare Other

## 2023-10-15 NOTE — Telephone Encounter (Signed)
 noted, I sent in the scripts last week

## 2023-10-15 NOTE — Telephone Encounter (Signed)
 Patient came in with police report as requested. Police report is placed in provider's box.

## 2023-10-18 ENCOUNTER — Encounter (HOSPITAL_COMMUNITY): Payer: Self-pay

## 2023-10-18 ENCOUNTER — Ambulatory Visit (HOSPITAL_COMMUNITY): Payer: Medicare Other

## 2023-11-01 ENCOUNTER — Other Ambulatory Visit (HOSPITAL_COMMUNITY): Payer: Self-pay | Admitting: Psychiatry

## 2023-11-02 ENCOUNTER — Encounter (HOSPITAL_COMMUNITY): Payer: Self-pay | Admitting: Psychiatry

## 2023-11-02 ENCOUNTER — Telehealth (INDEPENDENT_AMBULATORY_CARE_PROVIDER_SITE_OTHER): Payer: Medicare Other | Admitting: Psychiatry

## 2023-11-02 DIAGNOSIS — F901 Attention-deficit hyperactivity disorder, predominantly hyperactive type: Secondary | ICD-10-CM

## 2023-11-02 DIAGNOSIS — F332 Major depressive disorder, recurrent severe without psychotic features: Secondary | ICD-10-CM

## 2023-11-02 DIAGNOSIS — F411 Generalized anxiety disorder: Secondary | ICD-10-CM | POA: Diagnosis not present

## 2023-11-02 MED ORDER — ALPRAZOLAM 1 MG PO TABS: 1.0000 mg | ORAL_TABLET | Freq: Four times a day (QID) | ORAL | 0 refills | Status: DC

## 2023-11-02 MED ORDER — CITALOPRAM HYDROBROMIDE 20 MG PO TABS: 20.0000 mg | ORAL_TABLET | Freq: Two times a day (BID) | ORAL | 2 refills | Status: DC

## 2023-11-02 MED ORDER — CARBAMAZEPINE 200 MG PO TABS: 200.0000 mg | ORAL_TABLET | Freq: Two times a day (BID) | ORAL | 2 refills | Status: DC
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
## 2023-11-22 ENCOUNTER — Telehealth (HOSPITAL_COMMUNITY): Payer: Self-pay | Admitting: *Deleted

## 2023-11-22 ENCOUNTER — Telehealth (HOSPITAL_COMMUNITY): Payer: Medicare Other | Admitting: Psychiatry

## 2023-11-22 ENCOUNTER — Encounter (HOSPITAL_COMMUNITY): Payer: Self-pay | Admitting: Psychiatry

## 2023-11-22 DIAGNOSIS — F332 Major depressive disorder, recurrent severe without psychotic features: Secondary | ICD-10-CM

## 2023-11-22 DIAGNOSIS — F411 Generalized anxiety disorder: Secondary | ICD-10-CM

## 2023-11-22 DIAGNOSIS — F901 Attention-deficit hyperactivity disorder, predominantly hyperactive type: Secondary | ICD-10-CM | POA: Diagnosis not present

## 2023-11-22 MED ORDER — AMPHETAMINE-DEXTROAMPHETAMINE 20 MG PO TABS: 20.0000 mg | ORAL_TABLET | Freq: Three times a day (TID) | ORAL | 0 refills | Status: DC

## 2023-11-22 MED ORDER — DOXEPIN HCL 50 MG PO CAPS: 50.0000 mg | ORAL_CAPSULE | Freq: Every day | ORAL | 2 refills | Status: DC

## 2023-11-22 NOTE — Telephone Encounter (Signed)
 You could put her in for an appt today at 2 or sometime tomorrow

## 2023-11-22 NOTE — Progress Notes (Signed)
 Virtual Visit via Video Note  I connected with Destiny Orr on 11/22/23 at  2:00 PM EST by a video enabled telemedicine application and verified that I am speaking with the correct person using two identifiers.  Location: Patient: home Provider: office   I discussed the limitations of evaluation and management by telemedicine and the availability of in person appointments. The patient expressed understanding and agreed to proceed.      I discussed the assessment and treatment plan with the patient. The patient was provided an opportunity to ask questions and all were answered. The patient agreed with the plan and demonstrated an understanding of the instructions.   The patient was advised to call back or seek an in-person evaluation if the symptoms worsen or if the condition fails to improve as anticipated.  I provided 20 minutes of non-face-to-face time during this encounter.   Diannia Ruder, MD  [facility] MD/PA/NP OP Progress Note  11/22/2023 2:34 PM Destiny Orr  MRN:  [id number]  Chief Complaint:  Chief Complaint  Patient presents with   Anxiety   Depression   Family Problem   HPI: This patient is a 43 year old separated white female who lives with her ex-husband 1 son and 1 daughter her older daughter and baby in [place]. She is on disability.   The patient returns for follow-up after 3 weeks as a work in today regarding her depression and anxiety.  She states that her father passed away 2 days ago after spending a week in the hospital.  This is after he became nonresponsive at home.  He was on life support and she spent most of that whole week in the hospital with him as well as her mother.  She states that at times her father would become responsive and squeeze her hand.  2 days ago the family made the decision to remove life support.  She states that she is still very upset and angry with God and upset that her father had to die this way.  She states that he wanted to die at home.   I reminded her that she and her mother did everything they could to keep him comfortable.  They are going to have a memorial service later this week.  The patient states that she has not been able to eat or sleep.  She tried Seroquel but it did not keep her asleep.  She is already on Xanax 1 mg 4 times a day.  We cannot go up any higher but I offered to change it and she does not want to do so.  She states that she did eat a little bit today and I will send in doxepin 50 mg to help her sleep tonight.  She does not want to talk to anyone at a church for grief counseling at this point.  She does have an appointment next week with her therapist.  She denies any thoughts of suicide or self-harm but does seem distraught at times with then pulled herself back together. Visit Diagnosis:    ICD-10-CM   1. Major depressive disorder, recurrent, severe without psychotic features (HCC)  F33.2     2. GAD (generalized anxiety disorder)  F41.1     3. Attention deficit hyperactivity disorder (ADHD), predominantly hyperactive type  F90.1       Past Psychiatric History: Long-term outpatient treatment for depression and anxiety  Past Medical History:  Past Medical History:  Diagnosis Date   Abnormal Papanicolaou smear of cervix with positive human papilloma  virus (HPV) test 10/11/2018   LSIL +HPV, get colpo___   Abnormal uterine bleeding (AUB) 12/09/2014   Anxiety    Arthritis    Depression    DJD (degenerative joint disease)    Dumping syndrome    Dysmenorrhea 07/15/2014   Fatigue 12/24/2012   Fibromyalgia diagnosed April 2016   Headache    Hx of migraine headaches 12/24/2012   Inappropriate sinus tachycardia (HCC)    Irregular menstrual bleeding 07/15/2014   Pelvic pain in female 03/03/2016   Pneumothorax, spontaneous, tension    Post concussion syndrome    POTS (postural orthostatic tachycardia syndrome)    PTSD (post-traumatic stress disorder)    Scoliosis    Thyroid disease    Tick bite 03/03/2016    Vitamin B 12 deficiency     Past Surgical History:  Procedure Laterality Date   bone spurs toes Right    CESAREAN SECTION     COLONOSCOPY     endooscopy     KNEE SURGERY     rotate cuff lt arm      Family Psychiatric History: See below  Family History:  Family History  Problem Relation Age of Onset   Hypertension Father    Atrial fibrillation Father    Alcohol abuse Father    Cancer Maternal Grandmother        skin    Heart disease Maternal Grandmother    Other Maternal Grandmother        had thyroid removed   Breast cancer Maternal Grandmother    Heart disease Paternal Grandfather    COPD Paternal Grandfather    Hypertension Paternal Grandfather    Diabetes Paternal Grandfather    Stroke Paternal Grandfather    Cancer Maternal Grandfather        bladder,lung   Anxiety disorder Maternal Aunt    Anxiety disorder Maternal Uncle    Bipolar disorder Maternal Uncle    Breast cancer Maternal Uncle        CML   Leukemia Other    Colon cancer Other        lung-2 maternal great uncles    Social History:  Social History   Socioeconomic History   Marital status: Married    Spouse name: Not on file   Number of children: 3   Years of education: Not on file   Highest education level: Not on file  Occupational History   Not on file  Tobacco Use   Smoking status: Former    Current packs/day: 0.00    Average packs/day: 1 pack/day for 24.5 years (24.5 ttl pk-yrs)    Types: Cigarettes    Start date: 03/13/1997    Quit date: 09/25/2021    Years since quitting: 2.1   Smokeless tobacco: Never   Tobacco comments:    "working on it" Was smoking 2.5 packs a day  Vaping Use   Vaping status: Never Used  Substance and Sexual Activity   Alcohol use: No   Drug use: No   Sexual activity: Yes    Partners: Male    Birth control/protection: Pill  Other Topics Concern   Not on file  Social History Narrative   Not on file   Social Drivers of Health   Financial Resource  Strain: High Risk (07/03/2022)   Overall Financial Resource Strain (CARDIA)    Difficulty of Paying Living Expenses: Very hard  Food Insecurity: Food Insecurity Present (07/03/2022)   Hunger Vital Sign    Worried About Radiation protection practitioner of Food  in the Last Year: Sometimes true    Ran Out of Food in the Last Year: Never true  Transportation Needs: No Transportation Needs (07/03/2022)   PRAPARE - Administrator, Civil Service (Medical): No    Lack of Transportation (Non-Medical): No  Physical Activity: Inactive (01/07/2021)   Exercise Vital Sign    Days of Exercise per Week: 0 days    Minutes of Exercise per Session: 0 min  Stress: Stress Concern Present (07/03/2022)   [employer] of Occupational Health - Occupational Stress Questionnaire    Feeling of Stress : Very much  Social Connections: Moderately Integrated (07/03/2022)   Social Connection and Isolation Panel [NHANES]    Frequency of Communication with Friends and Family: More than three times a week    Frequency of Social Gatherings with Friends and Family: Once a week    Attends Religious Services: 1 to 4 times per year    Active Member of [employer] or Organizations: No    Attends Banker Meetings: Never    Marital Status: Married    Allergies:  Allergies  Allergen Reactions   Prozac [Fluoxetine]     Suicidal thoughts   Topiramate Other (See Comments)    Topamax-dizziness   Lexapro [Escitalopram Oxalate] Other (See Comments)    Flat affect, No emotions    Metabolic Disorder Labs: Lab Results  Component Value Date   HGBA1C 5.5 10/01/2023   MPG 108 01/07/2013   Lab Results  Component Value Date   PROLACTIN 13.2 06/12/2016   Lab Results  Component Value Date   CHOL 204 (H) 01/07/2021   TRIG 132 01/07/2021   HDL 72 01/07/2021   CHOLHDL 2.8 01/07/2021   VLDL 31 01/07/2013   LDLCALC 109 (H) 01/07/2021   LDLCALC 80 10/08/2018   Lab Results  Component Value Date   TSH 0.65 10/01/2023   TSH  4.080 01/07/2021    Therapeutic Level Labs: Lab Results  Component Value Date   LITHIUM 0.45 (L) 01/27/2020   LITHIUM 0.24 (L) 09/03/2019   No results found for: "VALPROATE" No results found for: "CBMZ"  Current Medications: Current Outpatient Medications  Medication Sig Dispense Refill   doxepin (SINEQUAN) 50 MG capsule Take 1 capsule (50 mg total) by mouth at bedtime. 30 capsule 2   gabapentin (NEURONTIN) 100 MG capsule One tab PO qHS for a week, then BID for a week, then TID. 90 capsule 2   ALPRAZolam (XANAX) 1 MG tablet Take 1 tablet (1 mg total) by mouth 4 (four) times daily. 120 tablet 0   amphetamine-dextroamphetamine (ADDERALL) 20 MG tablet TAKE ONE TABLET BY MOUTH THREE TIMES DAILY. 90 tablet 0   amphetamine-dextroamphetamine (ADDERALL) 20 MG tablet Take 1 tablet (20 mg total) by mouth 3 (three) times daily. 90 tablet 0   carbamazepine (TEGRETOL) 200 MG tablet Take 1 tablet (200 mg total) by mouth 2 (two) times daily. 60 tablet 2   citalopram (CELEXA) 20 MG tablet Take 1 tablet (20 mg total) by mouth 2 (two) times daily. 60 tablet 2   hyoscyamine (LEVSIN) 0.125 MG tablet Take 1 tablet (0.125 mg total) by mouth every 6 (six) hours as needed for cramping. 90 tablet 3   ibuprofen (ADVIL,MOTRIN) 200 MG tablet Take 400-600 mg by mouth every 6 (six) hours as needed for mild pain or moderate pain.     meloxicam (MOBIC) 7.5 MG tablet Take 1 tablet (7.5 mg total) by mouth daily. 30 tablet 0   Vitamin D, Ergocalciferol, (  DRISDOL) 1.25 MG (50000 UNIT) CAPS capsule Take 1 capsule (50,000 Units total) by mouth every 7 (seven) days. 12 capsule 0   No current facility-administered medications for this visit.     Musculoskeletal: Strength & Muscle Tone: within normal limits Gait & Station: normal Patient leans: N/A  Psychiatric Specialty Exam: Review of Systems  Psychiatric/Behavioral:  Positive for agitation and dysphoric mood. The patient is nervous/anxious.   All other systems  reviewed and are negative.   There were no vitals taken for this visit.There is no height or weight on file to calculate BMI.  General Appearance: Casual and Fairly Groomed  Eye Contact:  Good  Speech:  Clear and Coherent  Volume:  Normal  Mood:  Depressed and Irritable  Affect:  Depressed and Labile  Thought Process:  Goal Directed  Orientation:  Full (Time, Place, and Person)  Thought Content: Rumination   Suicidal Thoughts:  No  Homicidal Thoughts:  No  Memory:  Immediate;   Good Recent;   Good Remote;   Good  Judgement:  Good  Insight:  Fair  Psychomotor Activity:  Decreased  Concentration:  Concentration: Fair and Attention Span: Fair  Recall:  Good  Fund of Knowledge: Good  Language: Good  Akathisia:  No  Handed:  Right  AIMS (if indicated): not done  Assets:  Communication Skills Desire for Improvement Resilience Social Support  ADL's:  Intact  Cognition: WNL  Sleep:  Poor   Screenings: GAD-7    Flowsheet Row Office Visit from 07/03/2022 in [facility] for [facility] at [facility] Counselor from 05/31/2021 in [facility] [place] Office Visit from 01/07/2021 in [facility]  Total GAD-7 Score 21 20 16       Mini-Mental    Flowsheet Row Office Visit from 10/25/2016 in [facility]  Total Score (max 30 points ) 29      PHQ2-9    Flowsheet Row Office Visit from 07/03/2022 in [facility] for [facility] at [facility] Video Visit from 06/29/2022 in [facility] [place] Video Visit from 04/11/2022 in [facility] at [place] Video Visit from 03/03/2022 in [facility] at [place] Video Visit from 02/03/2022 in [facility] at [facility] Total Score 6 1 2 5 6   PHQ-9 Total Score 21 -- 6 13 12       Flowsheet Row ED from  09/21/2023 in [facility] Emergency Department at [facility] ED from 07/10/2023 in [facility] at [place] ED from 03/13/2023 in [facility] at [facility] RISK CATEGORY No Risk No Risk No Risk        Assessment and Plan: This patient is a 43 year old female with a history of depression chronic fatigue social anxiety ADD and borderline personality disorder.  She just had an acute loss of her father and she seems very distraught not sleeping and eating poorly.  I will send in doxepin 50 mg to help her sleep at night.  For now she will continue Celexa 20 mg twice daily for depression, Adderall 20 mg up to 3 times daily for ADHD, Xanax 1 mg 4 times daily for anxiety and Tegretol 200 mg twice daily for mood stabilization.  She will return to see me in 2 weeks  Collaboration of Care: Collaboration of Care: Referral or follow-up with counselor/therapist AEB patient  has been scheduled to see Suzan Garibaldi for therapy next week  Patient/Guardian was advised Release of Information must be obtained prior to any record release in order to collaborate their care with an outside provider. Patient/Guardian was advised if they have not already done so to contact the registration department to sign all necessary forms in order for [place] to release information regarding their care.   Consent: Patient/Guardian gives verbal consent for treatment and assignment of benefits for services provided during this visit. Patient/Guardian expressed understanding and agreed to proceed.    Diannia Ruder, MD 11/22/2023, 2:34 PM

## 2023-11-22 NOTE — Telephone Encounter (Signed)
 Per pt she don't know how to process her Dads last breath and she don't know what to do. Per pt this was just this Tuesday. Per pt she needs to talk with Dr. Tenny Craw. Per pt she scheduled an appt for therapy but still need to talk to provider. Per pt the number provider can call her on is [phone]. Per pt she can't handle it. Per pt she's mad, angry sad and don't know how to feel. Per pt she wants doctor to call her.

## 2023-11-23 NOTE — Progress Notes (Deleted)
   Cardiology Office Note    Date:  11/23/2023  ID:  Destiny Orr, Destiny Orr 04-26-1981, MRN [id number] PCP:  Maretta Bees, PA  Cardiologist:  Dina Rich, MD  Electrophysiologist:  None   Chief Complaint: ***  History of Present Illness: .    Destiny Orr is a 43 y.o. female with visit-pertinent history of POTS, palpitations, autonomic dysfunction, Raynaud's phenomenon, anxiety, depression, DJD, fibromyalgia, migraines, scoliosis, thyroid disease, vitamin B12 deficiency seen for follow-up. She has several year history of POTS/palpitations, previously evaluated by EP. Prior monitors have not shown arrhythmias, has had sinus tach. She has had significant side effects on beta blockers even low doses, has been tried on several in the past including low dose Lopressor 12.5mg . She did not tolerate Florinef 0.1mg  daily due to headaches and nausea. She was last seen via telemedicine in 2022 at which time midodrine was increased to 5mg  BID. Echo 2017 was normal, EF 60-65%  Iron panel EKG  POTS Sinus tachycardia Abnormal EKG Hypothyroidism   Labwork independently reviewed: 09/2023 B12 low end of normal, folate OK, K 4.7, Cr 0.65, LFTs ok, ANA positive with 1:40 (negative), TSH and Ft4 wnl, ESR wnl, A1c 5.5, CBC wnl, troponins negative 2021 RF negative, CCP negative     ROS: .    Please see the history of present illness. Otherwise, review of systems is positive for ***.  All other systems are reviewed and otherwise negative.  Studies Reviewed: Marland Kitchen    EKG:  EKG is ordered today, personally reviewed, demonstrating ***  CV Studies: Cardiac studies reviewed are outlined and summarized above. Otherwise please see EMR for full report.   Current Reported Medications:.    No outpatient medications have been marked as taking for the 11/27/23 encounter (Appointment) with Laurann Montana, PA-C.    Physical Exam:    VS:  There were no vitals taken for this visit.   Wt Readings from Last 3  Encounters:  10/01/23 152 lb 12.8 oz (69.3 kg)  09/21/23 146 lb (66.2 kg)  03/13/23 163 lb (73.9 kg)    GEN: Well nourished, well developed in no acute distress NECK: No JVD; No carotid bruits CARDIAC: ***RRR, no murmurs, rubs, gallops RESPIRATORY:  Clear to auscultation without rales, wheezing or rhonchi  ABDOMEN: Soft, non-tender, non-distended EXTREMITIES:  No edema; No acute deformity   Asessement and Plan:.     ***     Disposition: F/u with ***  Signed, Laurann Montana, PA-C

## 2023-11-27 ENCOUNTER — Ambulatory Visit: Payer: Medicare Other | Attending: Physician Assistant | Admitting: Physician Assistant

## 2023-11-27 DIAGNOSIS — R9431 Abnormal electrocardiogram [ECG] [EKG]: Secondary | ICD-10-CM

## 2023-11-27 DIAGNOSIS — R000 Tachycardia, unspecified: Secondary | ICD-10-CM

## 2023-11-27 DIAGNOSIS — E039 Hypothyroidism, unspecified: Secondary | ICD-10-CM

## 2023-11-27 DIAGNOSIS — G90A Postural orthostatic tachycardia syndrome (POTS): Secondary | ICD-10-CM

## 2023-11-28 ENCOUNTER — Encounter: Payer: Self-pay | Admitting: Physician Assistant

## 2023-12-03 ENCOUNTER — Ambulatory Visit (HOSPITAL_COMMUNITY): Payer: Medicare Other | Admitting: Clinical

## 2023-12-07 ENCOUNTER — Ambulatory Visit (HOSPITAL_COMMUNITY): Admitting: Clinical

## 2023-12-07 DIAGNOSIS — F901 Attention-deficit hyperactivity disorder, predominantly hyperactive type: Secondary | ICD-10-CM

## 2023-12-07 DIAGNOSIS — F411 Generalized anxiety disorder: Secondary | ICD-10-CM | POA: Diagnosis not present

## 2023-12-07 DIAGNOSIS — F332 Major depressive disorder, recurrent severe without psychotic features: Secondary | ICD-10-CM
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
## 2023-12-10 ENCOUNTER — Other Ambulatory Visit (HOSPITAL_COMMUNITY): Payer: Self-pay | Admitting: Psychiatry

## 2023-12-10 ENCOUNTER — Encounter (HOSPITAL_COMMUNITY): Payer: Self-pay | Admitting: Psychiatry

## 2023-12-10 ENCOUNTER — Telehealth (INDEPENDENT_AMBULATORY_CARE_PROVIDER_SITE_OTHER): Admitting: Psychiatry

## 2023-12-10 DIAGNOSIS — F411 Generalized anxiety disorder: Secondary | ICD-10-CM | POA: Diagnosis not present

## 2023-12-10 DIAGNOSIS — F901 Attention-deficit hyperactivity disorder, predominantly hyperactive type: Secondary | ICD-10-CM | POA: Diagnosis not present

## 2023-12-10 DIAGNOSIS — F332 Major depressive disorder, recurrent severe without psychotic features: Secondary | ICD-10-CM

## 2023-12-10 MED ORDER — TEMAZEPAM 30 MG PO CAPS: 30.0000 mg | ORAL_CAPSULE | Freq: Every evening | ORAL | 2 refills | Status: DC | PRN

## 2023-12-10 NOTE — Progress Notes (Signed)
 Virtual Visit via Video Note  I connected with Destiny Orr on 12/10/23 at  1:20 PM EDT by a video enabled telemedicine application and verified that I am speaking with the correct person using two identifiers.  Location: Patient: home Provider: office   I discussed the limitations of evaluation and management by telemedicine and the availability of in person appointments. The patient expressed understanding and agreed to proceed.      I discussed the assessment and treatment plan with the patient. The patient was provided an opportunity to ask questions and all were answered. The patient agreed with the plan and demonstrated an understanding of the instructions.   The patient was advised to call back or seek an in-person evaluation if the symptoms worsen or if the condition fails to improve as anticipated.  I provided 20 minutes of non-face-to-face time during this encounter.   Diannia Ruder, MD  [facility] MD/PA/NP OP Progress Note  12/10/2023 1:43 PM [name]  MRN:  [id number]  Chief Complaint:  Chief Complaint  Patient presents with   Depression   Anxiety   ADD   Follow-up   HPI: This patient is a 43 year old separated white female who lives with her ex-husband 1 son 1 daughter in [place].  She is on disability.  The patient returns for follow-up after 2 weeks regarding her depression and anxiety and grief reaction.  When I saw her last time her father had died in the hospital several days before the appointment.  She still going through the grieving process and feeling very overwhelmed.  At times she cries a lot and other times she shuts down and cannot talk to anybody.  The doxepin helped her sleep for a bit but now is no longer working and she is only getting a couple of hours per night.  She is exhausted.  She feels like she has no one she can talk to about this although other family members are going through this as well.  She denies any thoughts of self-harm or suicide.  She is  meeting with a therapist in our office.  I again suggested grief support but she does not feel ready.  I also suggested stopping the doxepin and trying Restoril because she really needs to restore her sleep. Visit Diagnosis:    ICD-10-CM   1. Major depressive disorder, recurrent, severe without psychotic features (HCC)  F33.2     2. GAD (generalized anxiety disorder)  F41.1     3. Attention deficit hyperactivity disorder (ADHD), predominantly hyperactive type  F90.1       Past Psychiatric History: Long-term outpatient treatment for depression and anxiety  Past Medical History:  Past Medical History:  Diagnosis Date   Abnormal Papanicolaou smear of cervix with positive human papilloma virus (HPV) test 10/11/2018   LSIL +HPV, get colpo___   Abnormal uterine bleeding (AUB) 12/09/2014   Anxiety    Arthritis    Depression    DJD (degenerative joint disease)    Dumping syndrome    Dysmenorrhea 07/15/2014   Fatigue 12/24/2012   Fibromyalgia diagnosed April 2016   Headache    Hx of migraine headaches 12/24/2012   Inappropriate sinus tachycardia (HCC)    Irregular menstrual bleeding 07/15/2014   Pelvic pain in female 03/03/2016   Pneumothorax, spontaneous, tension    Post concussion syndrome    POTS (postural orthostatic tachycardia syndrome)    PTSD (post-traumatic stress disorder)    Scoliosis    Thyroid disease    Tick bite 03/03/2016  Vitamin B 12 deficiency     Past Surgical History:  Procedure Laterality Date   bone spurs toes Right    CESAREAN SECTION     COLONOSCOPY     endooscopy     KNEE SURGERY     rotate cuff lt arm      Family Psychiatric History: See below  Family History:  Family History  Problem Relation Age of Onset   Hypertension Father    Atrial fibrillation Father    Alcohol abuse Father    Cancer Maternal Grandmother        skin    Heart disease Maternal Grandmother    Other Maternal Grandmother        had thyroid removed   Breast cancer Maternal  Grandmother    Heart disease Paternal Grandfather    COPD Paternal Grandfather    Hypertension Paternal Grandfather    Diabetes Paternal Grandfather    Stroke Paternal Grandfather    Cancer Maternal Grandfather        bladder,lung   Anxiety disorder Maternal Aunt    Anxiety disorder Maternal Uncle    Bipolar disorder Maternal Uncle    Breast cancer Maternal Uncle        CML   Leukemia Other    Colon cancer Other        lung-2 maternal great uncles    Social History:  Social History   Socioeconomic History   Marital status: Married    Spouse name: Not on file   Number of children: 3   Years of education: Not on file   Highest education level: Not on file  Occupational History   Not on file  Tobacco Use   Smoking status: Former    Current packs/day: 0.00    Average packs/day: 1 pack/day for 24.5 years (24.5 ttl pk-yrs)    Types: Cigarettes    Start date: 03/13/1997    Quit date: 09/25/2021    Years since quitting: 2.2   Smokeless tobacco: Never   Tobacco comments:    "working on it" Was smoking 2.5 packs a day  Vaping Use   Vaping status: Never Used  Substance and Sexual Activity   Alcohol use: No   Drug use: No   Sexual activity: Yes    Partners: Male    Birth control/protection: Pill  Other Topics Concern   Not on file  Social History Narrative   Not on file   Social Drivers of Health   Financial Resource Strain: High Risk (07/03/2022)   Overall Financial Resource Strain (CARDIA)    Difficulty of Paying Living Expenses: Very hard  Food Insecurity: Food Insecurity Present (07/03/2022)   Hunger Vital Sign    Worried About Running Out of Food in the Last Year: Sometimes true    Ran Out of Food in the Last Year: Never true  Transportation Needs: No Transportation Needs (07/03/2022)   PRAPARE - Administrator, Civil Service (Medical): No    Lack of Transportation (Non-Medical): No  Physical Activity: Inactive (01/07/2021)   Exercise Vital Sign     Days of Exercise per Week: 0 days    Minutes of Exercise per Session: 0 min  Stress: Stress Concern Present (07/03/2022)   [employer] of Occupational Health - Occupational Stress Questionnaire    Feeling of Stress : Very much  Social Connections: Moderately Integrated (07/03/2022)   Social Connection and Isolation Panel [NHANES]    Frequency of Communication with Friends and Family: More than  three times a week    Frequency of Social Gatherings with Friends and Family: Once a week    Attends Religious Services: 1 to 4 times per year    Active Member of [employer] or Organizations: No    Attends Banker Meetings: Never    Marital Status: Married    Allergies:  Allergies  Allergen Reactions   Prozac [Fluoxetine]     Suicidal thoughts   Topiramate Other (See Comments)    Topamax-dizziness   Lexapro [Escitalopram Oxalate] Other (See Comments)    Flat affect, No emotions    Metabolic Disorder Labs: Lab Results  Component Value Date   HGBA1C 5.5 10/01/2023   MPG 108 01/07/2013   Lab Results  Component Value Date   PROLACTIN 13.2 06/12/2016   Lab Results  Component Value Date   CHOL 204 (H) 01/07/2021   TRIG 132 01/07/2021   HDL 72 01/07/2021   CHOLHDL 2.8 01/07/2021   VLDL 31 01/07/2013   LDLCALC 109 (H) 01/07/2021   LDLCALC 80 10/08/2018   Lab Results  Component Value Date   TSH 0.65 10/01/2023   TSH 4.080 01/07/2021    Therapeutic Level Labs: Lab Results  Component Value Date   LITHIUM 0.45 (L) 01/27/2020   LITHIUM 0.24 (L) 09/03/2019   No results found for: "VALPROATE" No results found for: "CBMZ"  Current Medications: Current Outpatient Medications  Medication Sig Dispense Refill   gabapentin (NEURONTIN) 100 MG capsule One tab PO qHS for a week, then BID for a week, then TID. 90 capsule 2   temazepam (RESTORIL) 30 MG capsule Take 1 capsule (30 mg total) by mouth at bedtime as needed for sleep. 30 capsule 2   ALPRAZolam (XANAX) 1 MG tablet  Take 1 tablet (1 mg total) by mouth 4 (four) times daily. 120 tablet 0   amphetamine-dextroamphetamine (ADDERALL) 20 MG tablet TAKE ONE TABLET BY MOUTH THREE TIMES DAILY. 90 tablet 0   amphetamine-dextroamphetamine (ADDERALL) 20 MG tablet Take 1 tablet (20 mg total) by mouth 3 (three) times daily. 90 tablet 0   carbamazepine (TEGRETOL) 200 MG tablet Take 1 tablet (200 mg total) by mouth 2 (two) times daily. 60 tablet 2   citalopram (CELEXA) 20 MG tablet Take 1 tablet (20 mg total) by mouth 2 (two) times daily. 60 tablet 2   hyoscyamine (LEVSIN) 0.125 MG tablet Take 1 tablet (0.125 mg total) by mouth every 6 (six) hours as needed for cramping. 90 tablet 3   ibuprofen (ADVIL,MOTRIN) 200 MG tablet Take 400-600 mg by mouth every 6 (six) hours as needed for mild pain or moderate pain.     meloxicam (MOBIC) 7.5 MG tablet Take 1 tablet (7.5 mg total) by mouth daily. 30 tablet 0   Vitamin D, Ergocalciferol, (DRISDOL) 1.25 MG (50000 UNIT) CAPS capsule Take 1 capsule (50,000 Units total) by mouth every 7 (seven) days. 12 capsule 0   No current facility-administered medications for this visit.     Musculoskeletal: Strength & Muscle Tone: within normal limits Gait & Station: normal Patient leans: N/A  Psychiatric Specialty Exam: Review of Systems  Psychiatric/Behavioral:  Positive for decreased concentration, dysphoric mood and sleep disturbance. The patient is nervous/anxious.   All other systems reviewed and are negative.   There were no vitals taken for this visit.There is no height or weight on file to calculate BMI.  General Appearance: Casual and Fairly Groomed  Eye Contact:  Good  Speech:  Clear and Coherent  Volume:  Normal  Mood:  Anxious and Depressed  Affect:  Congruent and Tearful  Thought Process:  Goal Directed  Orientation:  Full (Time, Place, and Person)  Thought Content: Rumination   Suicidal Thoughts:  No  Homicidal Thoughts:  No  Memory:  Immediate;   Good Recent;    Good Remote;   Good  Judgement:  Good  Insight:  Fair  Psychomotor Activity:  Decreased  Concentration:  Concentration: Fair and Attention Span: Fair  Recall:  Good  Fund of Knowledge: Good  Language: Good  Akathisia:  No  Handed:  Right  AIMS (if indicated): not done  Assets:  Communication Skills Physical Health Resilience Social Support  ADL's:  Intact  Cognition: WNL  Sleep:  Poor   Screenings: GAD-7    Advertising copywriter from 12/07/2023 in [facility] at [place] Office Visit from 07/03/2022 in [facility] at [facility] Counselor from 05/31/2021 in [facility] at [place] Office Visit from 01/07/2021 in [facility] for [facility] at [facility]  Total GAD-7 Score 21 21 20 16       Mini-Mental    Flowsheet Row Office Visit from 10/25/2016 in [facility] Neu[facility]  Total Score (max 30 points ) 29      PHQ2-9    Flowsheet Row Counselor from 12/07/2023 in [facility] at [place] Office Visit from 07/03/2022 in [facility] for [facility] at [facility] Video Visit from 06/29/2022 in [facility] at [employer] Video Visit from 04/11/2022 in [facility] at [place] Video Visit from 03/03/2022 in [facility] at [facility] Total Score 3 6 1 2 5   PHQ-9 Total Score 14 21 -- 6 13      Flowsheet Row ED from 09/21/2023 in [facility] at [facility] ED from 07/10/2023 in [facility] [place] ED from 03/13/2023 in [facility] at [facility] RISK CATEGORY No Risk No Risk No Risk        Assessment and Plan: This patient is a 43 year old female with a history depression chronic fatigue social anxiety ADD and borderline personality disorder.  She again seems quite distraught at  the loss of her father.  She is not sleeping well so we will discontinue doxepin in favor of Restoril 30 mg at bedtime.  She will continue Celexa 20 mg twice daily for depression, Adderall 20 mg up to 3 times daily for ADHD, Xanax 1 mg 4 times daily for anxiety and Tegretol 200 mg twice daily for mood stabilization.  She will return to see me in 3 weeks  Collaboration of Care: Collaboration of Care: Referral or follow-up with counselor/therapist AEB patient will continue seeing Suzan Garibaldi in our office for therapy.  She declines referrals to any sort of groups  Patient/Guardian was advised Release of Information must be obtained prior to any record release in order to collaborate their care with an outside provider. Patient/Guardian was advised if they have not already done so to contact the registration department to sign all necessary forms in order for [place] to release information regarding their care.   Consent: Patient/Guardian gives verbal consent for treatment and assignment of benefits for services provided during this visit. Patient/Guardian expressed understanding and agreed to proceed.    Diannia Ruder, MD 12/10/2023, 1:43 PM

## 2023-12-13 ENCOUNTER — Encounter (HOSPITAL_COMMUNITY): Payer: Self-pay

## 2023-12-13 ENCOUNTER — Other Ambulatory Visit (HOSPITAL_COMMUNITY): Payer: Self-pay | Admitting: Psychiatry

## 2023-12-13 MED ORDER — ALPRAZOLAM 1 MG PO TABS: 1.0000 mg | ORAL_TABLET | Freq: Four times a day (QID) | ORAL | 2 refills | Status: DC

## 2023-12-13 MED ORDER — ZOLPIDEM TARTRATE ER 12.5 MG PO TBCR: 12.5000 mg | EXTENDED_RELEASE_TABLET | Freq: Every evening | ORAL | 0 refills | Status: DC | PRN

## 2023-12-30 ENCOUNTER — Other Ambulatory Visit (HOSPITAL_COMMUNITY): Payer: Self-pay | Admitting: Psychiatry

## 2023-12-31 ENCOUNTER — Ambulatory Visit (INDEPENDENT_AMBULATORY_CARE_PROVIDER_SITE_OTHER): Admitting: Clinical

## 2023-12-31 ENCOUNTER — Telehealth (HOSPITAL_COMMUNITY): Payer: Self-pay

## 2023-12-31 ENCOUNTER — Other Ambulatory Visit (HOSPITAL_COMMUNITY): Payer: Self-pay | Admitting: Psychiatry

## 2023-12-31 DIAGNOSIS — F901 Attention-deficit hyperactivity disorder, predominantly hyperactive type: Secondary | ICD-10-CM

## 2023-12-31 DIAGNOSIS — F332 Major depressive disorder, recurrent severe without psychotic features: Secondary | ICD-10-CM

## 2023-12-31 DIAGNOSIS — F411 Generalized anxiety disorder: Secondary | ICD-10-CM

## 2023-12-31 MED ORDER — AMPHETAMINE-DEXTROAMPHETAMINE 20 MG PO TABS: 20.0000 mg | ORAL_TABLET | Freq: Three times a day (TID) | ORAL | 0 refills | Status: DC

## 2023-12-31 NOTE — Telephone Encounter (Signed)
 Pt called in needing refill on her amphetamine-dextroamphetamine (ADDERALL) 20 MG tablet sent to [facility]. Pt is scheduled for 01/01/24. Please advise.

## 2023-12-31 NOTE — Telephone Encounter (Signed)
 sent

## 2023-12-31 NOTE — Progress Notes (Signed)
 Virtual Visit via Telephone Note   I connected with Destiny Orr on 12/31/23 at 11:00 AM EST by telephone and verified that I am speaking with the correct person using two identifiers.   Location: Patient: Home Provider: Office   I discussed the limitations, risks, security and privacy concerns of performing an evaluation and management service by telephone and the availability of in person appointments. I also discussed with the patient that there may be a patient responsible charge related to this service. The patient expressed understanding and agreed to proceed.     THERAPIST PROGRESS NOTE   Session Time: 11:00 AM-11:35 AM   Participation Level: Active   Behavioral Response: CasualAlertDepressed   Type of Therapy: Individual Therapy   Treatment Goals addressed: Coping for MH diagnosis   Interventions: CBT, Motivational Interviewing, Strength-based and Supportive   Summary: Destiny Orr is a 43 y.o. female who presents with Depression, ADHD, GAD.The OPT therapist worked with the patient for her ongoing outpatient. OPT treatment. The OPT therapist utilized Motivational Interviewing to assist in creating therapeutic repore. The patient in the session was engaged and work in collaboration giving feedback about her triggers and symptoms over the past few weeks and spoke about her ongoing adjustment post the passing of her Father. The OPT therapist utilized Cognitive Behavioral Therapy through cognitive restructuring as well as worked with the patient on self awareness and implementing coping strategies to assist in management of current stressors.The OPT therapist provided ongoing psychoeducation and support throughout the session with the patient. The patient spoke about her ongoing work with Dr. Tenny Craw and her med management.   Suicidal/Homicidal: Nowithout intent/plan   Therapist Response: The OPT therapist worked with the patient for the patients scheduled session. The patient was engaged in  her session and gave feedback in relation to triggers, symptoms, and behavior responses over the past few weeks including the impact of recently losing her Father. The patient spoke about her ongoing difficulty with functioning and adjusting in her daily tasks due to grief and this triggering her Depression symptoms over the past few weeks. The patient spoke about today being the first day she has felt like talking about how she has been feeling. The OPT therapist worked with the patient utilizing an in session Cognitive Behavioral Therapy exercise. The patient was responsive in the session and verbalized, " I had just shut myself down over the past few weeks and just recently I feel I have been letting myself feel things and not to go back into shutting down".  The OPT therapist worked with the patient providing psychotherapy in relation to grief and the grieving process.The OPT therapist worked with the patient to implement coping to improve her functioning as she continues to grieve the loss of her Father. The patient worked with the OPT therapist in implementing available coping strategies to help her with improving functioning.The OPT therapist will continue treatment work with the patient in her next scheduled session   Plan: Return again in 2/3 weeks.   Diagnosis:      Axis I: Major depressive disorder, recurrent, severe without psychotic features and GAD                           Axis II: No diagnosis       Collaboration of Care: Collaboration of Care (psychiatrist) Dr. Tenny Craw.    Patient/Guardian was advised Release of Information must be obtained prior to any record release in order to collaborate  their care with an outside provider. Patient/Guardian was advised if they have not already done so to contact the registration department to sign all necessary forms in order for [place] to release information regarding their care.    Consent: Patient/Guardian gives verbal consent for treatment and  assignment of benefits for services provided during this visit. Patient/Guardian expressed understanding and agreed to proceed.    I discussed the assessment and treatment plan with the patient. The patient was provided an opportunity to ask questions and all were answered. The patient agreed with the plan and demonstrated an understanding of the instructions.   The patient was advised to call back or seek an in-person evaluation if the symptoms worsen or if the condition fails to improve as anticipated.   I provided 35 minutes of non-face-to-face time during this encounter.     Winfred Burn, LCSW   12/31/2023

## 2023-12-31 NOTE — Telephone Encounter (Signed)
 Advised rx has been sent to pharmacy she verbalized understanding

## 2024-01-01 ENCOUNTER — Encounter (HOSPITAL_COMMUNITY): Payer: Self-pay | Admitting: Psychiatry

## 2024-01-01 ENCOUNTER — Telehealth (INDEPENDENT_AMBULATORY_CARE_PROVIDER_SITE_OTHER): Admitting: Psychiatry

## 2024-01-01 DIAGNOSIS — F332 Major depressive disorder, recurrent severe without psychotic features: Secondary | ICD-10-CM | POA: Diagnosis not present

## 2024-01-01 DIAGNOSIS — F901 Attention-deficit hyperactivity disorder, predominantly hyperactive type: Secondary | ICD-10-CM | POA: Diagnosis not present

## 2024-01-01 DIAGNOSIS — F411 Generalized anxiety disorder: Secondary | ICD-10-CM | POA: Diagnosis not present

## 2024-01-01 MED ORDER — CARBAMAZEPINE 200 MG PO TABS: 200.0000 mg | ORAL_TABLET | Freq: Two times a day (BID) | ORAL | 2 refills | Status: DC

## 2024-01-01 MED ORDER — CITALOPRAM HYDROBROMIDE 20 MG PO TABS: 20.0000 mg | ORAL_TABLET | Freq: Two times a day (BID) | ORAL | 2 refills | Status: DC

## 2024-01-01 MED ORDER — ZOLPIDEM TARTRATE ER 12.5 MG PO TBCR: 12.5000 mg | EXTENDED_RELEASE_TABLET | Freq: Every evening | ORAL | 2 refills | Status: DC | PRN

## 2024-01-01 NOTE — Progress Notes (Signed)
 Virtual Visit via Video Note  I connected with Destiny Orr on 01/01/24 at  9:40 AM EDT by a video enabled telemedicine application and verified that I am speaking with the correct person using two identifiers.  Location: Patient: home Provider: office   I discussed the limitations of evaluation and management by telemedicine and the availability of in person appointments. The patient expressed understanding and agreed to proceed.     I discussed the assessment and treatment plan with the patient. The patient was provided an opportunity to ask questions and all were answered. The patient agreed with the plan and demonstrated an understanding of the instructions.   The patient was advised to call back or seek an in-person evaluation if the symptoms worsen or if the condition fails to improve as anticipated.  I provided 20 minutes of non-face-to-face time during this encounter.   Diannia Ruder, MD  [facility] MD/PA/NP OP Progress Note  01/01/2024 10:03 AM Destiny Orr  MRN:  [id number]  Chief Complaint:  Chief Complaint  Patient presents with   Anxiety   Depression   ADD   Follow-up   HPI: This patient is a 43 year old separated white female who lives with her ex-husband 1 son 1 daughter in [place]. She is on disability.   The patient returns for follow-up after 3 weeks regarding her depression anxiety and grief reaction.  As noted in previous notes her father passed away on 12-07-2023.  She was crying nonstop extremely depressed and sad and unable to sleep or eat.  She states of the last few days for some reason she is starting to feel better.  She has been going out to watch the [place] in 1 night she fell her father's presence and since then she has been more calm.  She is still not sleeping well only about 4 hours a night.  She has tried doxepin and Restoril which have not helped.  I tried sending in Ambien which she said insurance would not cover it.  She is able to do more things now like  take care of her granddaughter and she is eating somewhat better.  She denies any thoughts of self-harm or suicide. Visit Diagnosis:    ICD-10-CM   1. Major depressive disorder, recurrent, severe without psychotic features (HCC)  F33.2     2. GAD (generalized anxiety disorder)  F41.1     3. Attention deficit hyperactivity disorder (ADHD), predominantly hyperactive type  F90.1       Past Psychiatric History: Long-term outpatient treatment for depression and anxiety  Past Medical History:  Past Medical History:  Diagnosis Date   Abnormal Papanicolaou smear of cervix with positive human papilloma virus (HPV) test 10/11/2018   LSIL +HPV, get colpo___   Abnormal uterine bleeding (AUB) 12/09/2014   Anxiety    Arthritis    Depression    DJD (degenerative joint disease)    Dumping syndrome    Dysmenorrhea 07/15/2014   Fatigue 12/24/2012   Fibromyalgia diagnosed April 2016   Headache    Hx of migraine headaches 12/24/2012   Inappropriate sinus tachycardia (HCC)    Irregular menstrual bleeding 07/15/2014   Pelvic pain in female 03/03/2016   Pneumothorax, spontaneous, tension    Post concussion syndrome    POTS (postural orthostatic tachycardia syndrome)    PTSD (post-traumatic stress disorder)    Scoliosis    Thyroid disease    Tick bite 03/03/2016   Vitamin B 12 deficiency     Past Surgical History:  Procedure  Laterality Date   bone spurs toes Right    CESAREAN SECTION     COLONOSCOPY     endooscopy     KNEE SURGERY     rotate cuff lt arm      Family Psychiatric History: See below  Family History:  Family History  Problem Relation Age of Onset   Hypertension Father    Atrial fibrillation Father    Alcohol abuse Father    Cancer Maternal Grandmother        skin    Heart disease Maternal Grandmother    Other Maternal Grandmother        had thyroid removed   Breast cancer Maternal Grandmother    Heart disease Paternal Grandfather    COPD Paternal Grandfather     Hypertension Paternal Grandfather    Diabetes Paternal Grandfather    Stroke Paternal Grandfather    Cancer Maternal Grandfather        bladder,lung   Anxiety disorder Maternal Aunt    Anxiety disorder Maternal Uncle    Bipolar disorder Maternal Uncle    Breast cancer Maternal Uncle        CML   Leukemia Other    Colon cancer Other        lung-2 maternal great uncles    Social History:  Social History   Socioeconomic History   Marital status: Married    Spouse name: Not on file   Number of children: 3   Years of education: Not on file   Highest education level: Not on file  Occupational History   Not on file  Tobacco Use   Smoking status: Former    Current packs/day: 0.00    Average packs/day: 1 pack/day for 24.5 years (24.5 ttl pk-yrs)    Types: Cigarettes    Start date: 03/13/1997    Quit date: 09/25/2021    Years since quitting: 2.2   Smokeless tobacco: Never   Tobacco comments:    "working on it" Was smoking 2.5 packs a day  Vaping Use   Vaping status: Never Used  Substance and Sexual Activity   Alcohol use: No   Drug use: No   Sexual activity: Yes    Partners: Male    Birth control/protection: Pill  Other Topics Concern   Not on file  Social History Narrative   Not on file   Social Drivers of Health   Financial Resource Strain: High Risk (07/03/2022)   Overall Financial Resource Strain (CARDIA)    Difficulty of Paying Living Expenses: Very hard  Food Insecurity: Food Insecurity Present (07/03/2022)   Hunger Vital Sign    Worried About Running Out of Food in the Last Year: Sometimes true    Ran Out of Food in the Last Year: Never true  Transportation Needs: No Transportation Needs (07/03/2022)   PRAPARE - Administrator, Civil Service (Medical): No    Lack of Transportation (Non-Medical): No  Physical Activity: Inactive (01/07/2021)   Exercise Vital Sign    Days of Exercise per Week: 0 days    Minutes of Exercise per Session: 0 min  Stress:  Stress Concern Present (07/03/2022)   [employer] of Occupational Health - Occupational Stress Questionnaire    Feeling of Stress : Very much  Social Connections: Moderately Integrated (07/03/2022)   Social Connection and Isolation Panel [NHANES]    Frequency of Communication with Friends and Family: More than three times a week    Frequency of Social Gatherings with Friends  and Family: Once a week    Attends Religious Services: 1 to 4 times per year    Active Member of Clubs or Organizations: No    Attends Banker Meetings: Never    Marital Status: Married    Allergies:  Allergies  Allergen Reactions   Prozac [Fluoxetine]     Suicidal thoughts   Topiramate Other (See Comments)    Topamax-dizziness   Lexapro [Escitalopram Oxalate] Other (See Comments)    Flat affect, No emotions    Metabolic Disorder Labs: Lab Results  Component Value Date   HGBA1C 5.5 10/01/2023   MPG 108 01/07/2013   Lab Results  Component Value Date   PROLACTIN 13.2 06/12/2016   Lab Results  Component Value Date   CHOL 204 (H) 01/07/2021   TRIG 132 01/07/2021   HDL 72 01/07/2021   CHOLHDL 2.8 01/07/2021   VLDL 31 01/07/2013   LDLCALC 109 (H) 01/07/2021   LDLCALC 80 10/08/2018   Lab Results  Component Value Date   TSH 0.65 10/01/2023   TSH 4.080 01/07/2021    Therapeutic Level Labs: Lab Results  Component Value Date   LITHIUM 0.45 (L) 01/27/2020   LITHIUM 0.24 (L) 09/03/2019   No results found for: "VALPROATE" No results found for: "CBMZ"  Current Medications: Current Outpatient Medications  Medication Sig Dispense Refill   gabapentin (NEURONTIN) 100 MG capsule One tab PO qHS for a week, then BID for a week, then TID. 90 capsule 2   ALPRAZolam (XANAX) 1 MG tablet Take 1 tablet (1 mg total) by mouth 4 (four) times daily. 120 tablet 2   amphetamine-dextroamphetamine (ADDERALL) 20 MG tablet Take 1 tablet (20 mg total) by mouth 3 (three) times daily. 90 tablet 0    amphetamine-dextroamphetamine (ADDERALL) 20 MG tablet Take 1 tablet (20 mg total) by mouth 3 (three) times daily. 90 tablet 0   carbamazepine (TEGRETOL) 200 MG tablet Take 1 tablet (200 mg total) by mouth 2 (two) times daily. 60 tablet 2   citalopram (CELEXA) 20 MG tablet Take 1 tablet (20 mg total) by mouth 2 (two) times daily. 60 tablet 2   hyoscyamine (LEVSIN) 0.125 MG tablet Take 1 tablet (0.125 mg total) by mouth every 6 (six) hours as needed for cramping. 90 tablet 3   ibuprofen (ADVIL,MOTRIN) 200 MG tablet Take 400-600 mg by mouth every 6 (six) hours as needed for mild pain or moderate pain.     meloxicam (MOBIC) 7.5 MG tablet Take 1 tablet (7.5 mg total) by mouth daily. 30 tablet 0   Vitamin D, Ergocalciferol, (DRISDOL) 1.25 MG (50000 UNIT) CAPS capsule Take 1 capsule (50,000 Units total) by mouth every 7 (seven) days. 12 capsule 0   zolpidem (AMBIEN CR) 12.5 MG CR tablet Take 1 tablet (12.5 mg total) by mouth at bedtime as needed for sleep. 30 tablet 2   No current facility-administered medications for this visit.     Musculoskeletal: Strength & Muscle Tone: within normal limits Gait & Station: normal Patient leans: N/A  Psychiatric Specialty Exam: Review of Systems  Psychiatric/Behavioral:  Positive for dysphoric mood and sleep disturbance.   All other systems reviewed and are negative.   There were no vitals taken for this visit.There is no height or weight on file to calculate BMI.  General Appearance: Casual and Fairly Groomed  Eye Contact:  Good  Speech:  Clear and Coherent  Volume:  Normal  Mood:  Dysphoric  Affect:  Tearful  Thought Process:  Goal Directed  Orientation:  Full (Time, Place, and Person)  Thought Content: Rumination   Suicidal Thoughts:  No  Homicidal Thoughts:  No  Memory:  Immediate;   Good Recent;   Good Remote;   Good  Judgement:  Good  Insight:  Fair  Psychomotor Activity:  Decreased  Concentration:  Concentration: Good and Attention Span:  Good  Recall:  Good  Fund of Knowledge: Good  Language: Good  Akathisia:  No  Handed:  Right  AIMS (if indicated): not done  Assets:  Communication Skills Desire for Improvement Physical Health Resilience Social Support  ADL's:  Intact  Cognition: WNL  Sleep:  Poor   Screenings: GAD-7    Advertising copywriter from 12/07/2023 in [facility] [place] Office Visit from 07/03/2022 in [facility] Counselor from 05/31/2021 in [facility] [place] Office Visit from 01/07/2021 in [facility]  Total GAD-7 Score 21 21 20 16       Mini-Mental    Flowsheet Row Office Visit from 10/25/2016 in [facility]  Total Score (max 30 points ) 29      PHQ2-9    Flowsheet Row Counselor from 12/07/2023 in [facility] [place] Office Visit from 07/03/2022 in [facility] Video Visit from 06/29/2022 in [facility] [place] Video Visit from 04/11/2022 in [facility] [place] Video Visit from 03/03/2022 in [facility] Total Score 3 6 1 2 5   PHQ-9 Total Score 14 21 -- 6 13      Flowsheet Row ED from 09/21/2023 in [facility] at [facility] ED from 07/10/2023 in [facility] [place] ED from 03/13/2023 in [facility] RISK CATEGORY No Risk No Risk No Risk        Assessment and Plan: This patient is a 43 year old female with history of depression chronic fatigue social anxiety ADD and borderline personality disorder.  She seems to be slowly improving in terms of her grief reaction.  We will still try to get the Ambien CR approved so she can get some sleep.  She will continue  Celexa 20 mg twice daily for depression, Adderall 20 mg up to 3 times daily for ADHD, Xanax 1 mg 4 times daily for anxiety and Tegretol 200 mg twice daily for mood stabilization.  She will return to see me in 4 weeks  Collaboration of Care: Collaboration of Care: Referral or follow-up with counselor/therapist AEB patient will continue therapy with Suzan Garibaldi in our office  Patient/Guardian was advised Release of Information must be obtained prior to any record release in order to collaborate their care with an outside provider. Patient/Guardian was advised if they have not already done so to contact the registration department to sign all necessary forms in order for [place] to release information regarding their care.   Consent: Patient/Guardian gives verbal consent for treatment and assignment of benefits for services provided during this visit. Patient/Guardian expressed understanding and agreed to proceed.    Diannia Ruder, MD 01/01/2024, 10:03 AM

## 2024-01-21 ENCOUNTER — Ambulatory Visit (HOSPITAL_COMMUNITY): Admitting: Clinical

## 2024-01-29 ENCOUNTER — Other Ambulatory Visit (HOSPITAL_COMMUNITY): Payer: Self-pay | Admitting: Psychiatry

## 2024-01-29 ENCOUNTER — Telehealth (HOSPITAL_COMMUNITY): Payer: Self-pay

## 2024-01-29 MED ORDER — AMPHETAMINE-DEXTROAMPHETAMINE 20 MG PO TABS: 20.0000 mg | ORAL_TABLET | Freq: Three times a day (TID) | ORAL | 0 refills | Status: DC

## 2024-01-29 NOTE — Telephone Encounter (Signed)
 sent

## 2024-01-29 NOTE — Telephone Encounter (Signed)
Spoke with pt advised rx has been sent to pharmacy she verbalized understanding

## 2024-01-29 NOTE — Telephone Encounter (Signed)
 Pt called in requesting a refill on her amphetamine -dextroamphetamine  (ADDERALL) 20 MG tablet sent to [facility]. Pt is scheduled 01/31/24. Please advise.

## 2024-01-31 ENCOUNTER — Telehealth (INDEPENDENT_AMBULATORY_CARE_PROVIDER_SITE_OTHER): Admitting: Psychiatry

## 2024-01-31 ENCOUNTER — Encounter (HOSPITAL_COMMUNITY): Payer: Self-pay | Admitting: Psychiatry

## 2024-01-31 DIAGNOSIS — F411 Generalized anxiety disorder: Secondary | ICD-10-CM | POA: Diagnosis not present

## 2024-01-31 DIAGNOSIS — F332 Major depressive disorder, recurrent severe without psychotic features: Secondary | ICD-10-CM

## 2024-01-31 DIAGNOSIS — F901 Attention-deficit hyperactivity disorder, predominantly hyperactive type: Secondary | ICD-10-CM

## 2024-01-31 MED ORDER — DIAZEPAM 10 MG PO TABS
10.0000 mg | ORAL_TABLET | Freq: Four times a day (QID) | ORAL | 0 refills | Status: DC
Start: 2024-01-31 — End: 2024-02-26

## 2024-01-31 MED ORDER — CARBAMAZEPINE 200 MG PO TABS: 200.0000 mg | ORAL_TABLET | Freq: Two times a day (BID) | ORAL | 2 refills | Status: DC

## 2024-01-31 MED ORDER — TRAZODONE HCL 100 MG PO TABS: 100.0000 mg | ORAL_TABLET | Freq: Every day | ORAL | 2 refills | Status: DC

## 2024-01-31 MED ORDER — CITALOPRAM HYDROBROMIDE 20 MG PO TABS: 20.0000 mg | ORAL_TABLET | Freq: Two times a day (BID) | ORAL | 2 refills | Status: DC
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
## 2024-01-31 NOTE — Progress Notes (Deleted)
 [facility] MD/PA/NP OP Progress Note  01/31/2024 2:08 PM Destiny Orr  MRN:  [id number]  Chief Complaint:  Chief Complaint  Patient presents with   Anxiety   Depression   Follow-up   HPI: *** Visit Diagnosis:    ICD-10-CM   1. Major depressive disorder, recurrent, severe without psychotic features (HCC)  F33.2     2. GAD (generalized anxiety disorder)  F41.1     3. Attention deficit hyperactivity disorder (ADHD), predominantly hyperactive type  F90.1       Past Psychiatric History: ***  Past Medical History:  Past Medical History:  Diagnosis Date   Abnormal Papanicolaou smear of cervix with positive human papilloma virus (HPV) test 10/11/2018   LSIL +HPV, get colpo___   Abnormal uterine bleeding (AUB) 12/09/2014   Anxiety    Arthritis    Depression    DJD (degenerative joint disease)    Dumping syndrome    Dysmenorrhea 07/15/2014   Fatigue 12/24/2012   Fibromyalgia diagnosed April 2016   Headache    Hx of migraine headaches 12/24/2012   Inappropriate sinus tachycardia (HCC)    Irregular menstrual bleeding 07/15/2014   Pelvic pain in female 03/03/2016   Pneumothorax, spontaneous, tension    Post concussion syndrome    POTS (postural orthostatic tachycardia syndrome)    PTSD (post-traumatic stress disorder)    Scoliosis    Thyroid  disease    Tick bite 03/03/2016   Vitamin B 12 deficiency     Past Surgical History:  Procedure Laterality Date   bone spurs toes Right    CESAREAN SECTION     COLONOSCOPY     endooscopy     KNEE SURGERY     rotate cuff lt arm      Family Psychiatric History: ***  Family History:  Family History  Problem Relation Age of Onset   Hypertension Father    Atrial fibrillation Father    Alcohol abuse Father    Cancer Maternal Grandmother        skin    Heart disease Maternal Grandmother    Other Maternal Grandmother        had thyroid  removed   Breast cancer Maternal Grandmother    Heart disease Paternal Grandfather    COPD Paternal  Grandfather    Hypertension Paternal Grandfather    Diabetes Paternal Grandfather    Stroke Paternal Grandfather    Cancer Maternal Grandfather        bladder,lung   Anxiety disorder Maternal Aunt    Anxiety disorder Maternal Uncle    Bipolar disorder Maternal Uncle    Breast cancer Maternal Uncle        CML   Leukemia Other    Colon cancer Other        lung-2 maternal great uncles    Social History:  Social History   Socioeconomic History   Marital status: Married    Spouse name: Not on file   Number of children: 3   Years of education: Not on file   Highest education level: Not on file  Occupational History   Not on file  Tobacco Use   Smoking status: Former    Current packs/day: 0.00    Average packs/day: 1 pack/day for 24.5 years (24.5 ttl pk-yrs)    Types: Cigarettes    Start date: 03/13/1997    Quit date: 09/25/2021    Years since quitting: 2.3   Smokeless tobacco: Never   Tobacco comments:    "working on it" Was smoking  2.5 packs a day  Vaping Use   Vaping status: Never Used  Substance and Sexual Activity   Alcohol use: No   Drug use: No   Sexual activity: Yes    Partners: Male    Birth control/protection: Pill  Other Topics Concern   Not on file  Social History Narrative   Not on file   Social Drivers of Health   Financial Resource Strain: High Risk (07/03/2022)   Overall Financial Resource Strain (CARDIA)    Difficulty of Paying Living Expenses: Very hard  Food Insecurity: Food Insecurity Present (07/03/2022)   Hunger Vital Sign    Worried About Running Out of Food in the Last Year: Sometimes true    Ran Out of Food in the Last Year: Never true  Transportation Needs: No Transportation Needs (07/03/2022)   PRAPARE - Administrator, Civil Service (Medical): No    Lack of Transportation (Non-Medical): No  Physical Activity: Inactive (01/07/2021)   Exercise Vital Sign    Days of Exercise per Week: 0 days    Minutes of Exercise per Session:  0 min  Stress: Stress Concern Present (07/03/2022)   [employer] of Occupational Health - Occupational Stress Questionnaire    Feeling of Stress : Very much  Social Connections: Moderately Integrated (07/03/2022)   Social Connection and Isolation Panel [NHANES]    Frequency of Communication with Friends and Family: More than three times a week    Frequency of Social Gatherings with Friends and Family: Once a week    Attends Religious Services: 1 to 4 times per year    Active Member of [employer] or Organizations: No    Attends Banker Meetings: Never    Marital Status: Married    Allergies:  Allergies  Allergen Reactions   Prozac  [Fluoxetine ]     Suicidal thoughts   Topiramate Other (See Comments)    Topamax-dizziness   Lexapro  [Escitalopram  Oxalate] Other (See Comments)    Flat affect, No emotions    Metabolic Disorder Labs: Lab Results  Component Value Date   HGBA1C 5.5 10/01/2023   MPG 108 01/07/2013   Lab Results  Component Value Date   PROLACTIN 13.2 06/12/2016   Lab Results  Component Value Date   CHOL 204 (H) 01/07/2021   TRIG 132 01/07/2021   HDL 72 01/07/2021   CHOLHDL 2.8 01/07/2021   VLDL 31 01/07/2013   LDLCALC 109 (H) 01/07/2021   LDLCALC 80 10/08/2018   Lab Results  Component Value Date   TSH 0.65 10/01/2023   TSH 4.080 01/07/2021    Therapeutic Level Labs: Lab Results  Component Value Date   LITHIUM  0.45 (L) 01/27/2020   LITHIUM  0.24 (L) 09/03/2019   No results found for: "VALPROATE" No results found for: "CBMZ"  Current Medications: Current Outpatient Medications  Medication Sig Dispense Refill   diazepam  (VALIUM ) 10 MG tablet Take 1 tablet (10 mg total) by mouth 4 (four) times daily. 120 tablet 0   gabapentin  (NEURONTIN ) 100 MG capsule One tab PO qHS for a week, then BID for a week, then TID. 90 capsule 2   ALPRAZolam  (XANAX ) 1 MG tablet Take 1 tablet (1 mg total) by mouth 4 (four) times daily. 120 tablet 2    amphetamine -dextroamphetamine  (ADDERALL) 20 MG tablet Take 1 tablet (20 mg total) by mouth 3 (three) times daily. 90 tablet 0   amphetamine -dextroamphetamine  (ADDERALL) 20 MG tablet Take 1 tablet (20 mg total) by mouth 3 (three) times daily. 90 tablet  0   carbamazepine  (TEGRETOL ) 200 MG tablet Take 1 tablet (200 mg total) by mouth 2 (two) times daily. 60 tablet 2   citalopram  (CELEXA ) 20 MG tablet Take 1 tablet (20 mg total) by mouth 2 (two) times daily. 60 tablet 2   hyoscyamine  (LEVSIN) 0.125 MG tablet Take 1 tablet (0.125 mg total) by mouth every 6 (six) hours as needed for cramping. 90 tablet 3   ibuprofen  (ADVIL ,MOTRIN ) 200 MG tablet Take 400-600 mg by mouth every 6 (six) hours as needed for mild pain or moderate pain.     meloxicam  (MOBIC ) 7.5 MG tablet Take 1 tablet (7.5 mg total) by mouth daily. 30 tablet 0   traZODone  (DESYREL ) 100 MG tablet Take 1 tablet (100 mg total) by mouth at bedtime. 30 tablet 2   Vitamin D , Ergocalciferol , (DRISDOL ) 1.25 MG (50000 UNIT) CAPS capsule Take 1 capsule (50,000 Units total) by mouth every 7 (seven) days. 12 capsule 0   No current facility-administered medications for this visit.     Musculoskeletal: Strength & Muscle Tone: {desc; muscle tone:32375} Gait & Station: {PE GAIT ED ZOXW:96045} Patient leans: {Patient Leans:21022755}  Psychiatric Specialty Exam: Review of Systems  There were no vitals taken for this visit.There is no height or weight on file to calculate BMI.  General Appearance: {Appearance:22683}  Eye Contact:  {BHH EYE CONTACT:22684}  Speech:  {Speech:22685}  Volume:  {Volume (PAA):22686}  Mood:  {BHH MOOD:22306}  Affect:  {Affect (PAA):22687}  Thought Process:  {Thought Process (PAA):22688}  Orientation:  {BHH ORIENTATION (PAA):22689}  Thought Content: {Thought Content:22690}   Suicidal Thoughts:  {ST/HT (PAA):22692}  Homicidal Thoughts:  {ST/HT (PAA):22692}  Memory:  {BHH MEMORY:22881}  Judgement:  {Judgement (PAA):22694}   Insight:  {Insight (PAA):22695}  Psychomotor Activity:  {Psychomotor (PAA):22696}  Concentration:  {Concentration:21399}  Recall:  {BHH GOOD/FAIR/POOR:22877}  Fund of Knowledge: {BHH GOOD/FAIR/POOR:22877}  Language: {BHH GOOD/FAIR/POOR:22877}  Akathisia:  {BHH YES OR NO:22294}  Handed:  {Handed:22697}  AIMS (if indicated): {Desc; done/not:10129}  Assets:  {Assets (PAA):22698}  ADL's:  {BHH WUJ'W:11914}  Cognition: {chl bhh cognition:304700322}  Sleep:  {BHH GOOD/FAIR/POOR:22877}   Screenings: GAD-7    Advertising copywriter from 12/07/2023 in [facility] at [place] Office Visit from 07/03/2022 in [facility] for [facility] Healthcare at [facility] Counselor from 05/31/2021 in [facility] at [place] Office Visit from 01/07/2021 in [facility] for Women's Healthcare at [facility]  Total GAD-7 Score 21 21 20 16       Mini-Mental    Flowsheet Row Office Visit from 10/25/2016 in [facility] Neurology  Total Score (max 30 points ) 29      PHQ2-9    Flowsheet Row Counselor from 12/07/2023 in [facility] at [place] Office Visit from 07/03/2022 in [facility] for [facility] Healthcare at [facility] Video Visit from 06/29/2022 in [facility] at [place] Video Visit from 04/11/2022 in [facility] at [place] Video Visit from 03/03/2022 in [facility] at [facility] Total Score 3 6 1 2 5   PHQ-9 Total Score 14 21 -- 6 13      Flowsheet Row ED from 09/21/2023 in [facility] Emergency Department at [facility] ED from 07/10/2023 in [facility] Urgent Care at [place] ED from 03/13/2023 in [facility] Urgent Care at [facility] RISK CATEGORY No Risk No Risk No Risk  Assessment and Plan: ***  Collaboration of Care: Collaboration of Care: {BH OP Collaboration of  Care:21014065}  Patient/Guardian was advised Release of Information must be obtained prior to any record release in order to collaborate their care with an outside provider. Patient/Guardian was advised if they have not already done so to contact the registration department to sign all necessary forms in order for us  to release information regarding their care.   Consent: Patient/Guardian gives verbal consent for treatment and assignment of benefits for services provided during this visit. Patient/Guardian expressed understanding and agreed to proceed.    Alfredia Annas, MD 01/31/2024, 2:08 PM

## 2024-01-31 NOTE — Progress Notes (Signed)
 Virtual Visit via Video Note  I connected with Destiny Orr on 01/31/24 at  2:00 PM EDT by a video enabled telemedicine application and verified that I am speaking with the correct person using two identifiers.  Location: Patient: home Provider: office   I discussed the limitations of evaluation and management by telemedicine and the availability of in person appointments. The patient expressed understanding and agreed to proceed.    I discussed the assessment and treatment plan with the patient. The patient was provided an opportunity to ask questions and all were answered. The patient agreed with the plan and demonstrated an understanding of the instructions.   The patient was advised to call back or seek an in-person evaluation if the symptoms worsen or if the condition fails to improve as anticipated.  I provided 20 minutes of non-face-to-face time during this encounter.   Destiny Annas, MD  [facility] MD/PA/NP OP Progress Note  01/31/2024 2:09 PM [name]  MRN:  [id number]  Chief Complaint:  Chief Complaint  Patient presents with   Anxiety   Depression   Follow-up   HPI: This patient is a 43 year old separated white female who lives with her ex-husband 1 son 1 daughter in [place]. She is on disability.   The patient returns for follow-up after 4 weeks regarding her depression anxiety and grief reaction.  As noted previously her father passed away on 2023-11-27.  She states that she is now "trying not to think about it."  She is doing more around her house but sometimes has bad days.  She states that she is extremely anxious and feels shaky inside all the time.  She does not think the Xanax  is really working anymore.  The clonazepam  caused suicidal thoughts.  She has not tried Valium  and I think this would be the next step.  She is also waking up every morning at 4 AM.  Restoril  and doxepin  did not help.  I suggested we try trazodone  as her insurance will not approve Ambien .  She denies  thoughts of self-harm or suicide and she is eating somewhat better Visit Diagnosis:    ICD-10-CM   1. Major depressive disorder, recurrent, severe without psychotic features (HCC)  F33.2     2. GAD (generalized anxiety disorder)  F41.1     3. Attention deficit hyperactivity disorder (ADHD), predominantly hyperactive type  F90.1       Past Psychiatric History: Long-term outpatient treatment for depression and anxiety  Past Medical History:  Past Medical History:  Diagnosis Date   Abnormal Papanicolaou smear of cervix with positive human papilloma virus (HPV) test 10/11/2018   LSIL +HPV, get colpo___   Abnormal uterine bleeding (AUB) 12/09/2014   Anxiety    Arthritis    Depression    DJD (degenerative joint disease)    Dumping syndrome    Dysmenorrhea 07/15/2014   Fatigue 12/24/2012   Fibromyalgia diagnosed April 2016   Headache    Hx of migraine headaches 12/24/2012   Inappropriate sinus tachycardia (HCC)    Irregular menstrual bleeding 07/15/2014   Pelvic pain in female 03/03/2016   Pneumothorax, spontaneous, tension    Post concussion syndrome    POTS (postural orthostatic tachycardia syndrome)    PTSD (post-traumatic stress disorder)    Scoliosis    Thyroid  disease    Tick bite 03/03/2016   Vitamin B 12 deficiency     Past Surgical History:  Procedure Laterality Date   bone spurs toes Right    CESAREAN SECTION  COLONOSCOPY     endooscopy     KNEE SURGERY     rotate cuff lt arm      Family Psychiatric History: See below  Family History:  Family History  Problem Relation Age of Onset   Hypertension Father    Atrial fibrillation Father    Alcohol abuse Father    Cancer Maternal Grandmother        skin    Heart disease Maternal Grandmother    Other Maternal Grandmother        had thyroid  removed   Breast cancer Maternal Grandmother    Heart disease Paternal Grandfather    COPD Paternal Grandfather    Hypertension Paternal Grandfather    Diabetes Paternal  Grandfather    Stroke Paternal Grandfather    Cancer Maternal Grandfather        bladder,lung   Anxiety disorder Maternal Aunt    Anxiety disorder Maternal Uncle    Bipolar disorder Maternal Uncle    Breast cancer Maternal Uncle        CML   Leukemia Other    Colon cancer Other        lung-2 maternal great uncles    Social History:  Social History   Socioeconomic History   Marital status: Married    Spouse name: Not on file   Number of children: 3   Years of education: Not on file   Highest education level: Not on file  Occupational History   Not on file  Tobacco Use   Smoking status: Former    Current packs/day: 0.00    Average packs/day: 1 pack/day for 24.5 years (24.5 ttl pk-yrs)    Types: Cigarettes    Start date: 03/13/1997    Quit date: 09/25/2021    Years since quitting: 2.3   Smokeless tobacco: Never   Tobacco comments:    "working on it" Was smoking 2.5 packs a day  Vaping Use   Vaping status: Never Used  Substance and Sexual Activity   Alcohol use: No   Drug use: No   Sexual activity: Yes    Partners: Male    Birth control/protection: Pill  Other Topics Concern   Not on file  Social History Narrative   Not on file   Social Drivers of Health   Financial Resource Strain: High Risk (07/03/2022)   Overall Financial Resource Strain (CARDIA)    Difficulty of Paying Living Expenses: Very hard  Food Insecurity: Food Insecurity Present (07/03/2022)   Hunger Vital Sign    Worried About Running Out of Food in the Last Year: Sometimes true    Ran Out of Food in the Last Year: Never true  Transportation Needs: No Transportation Needs (07/03/2022)   PRAPARE - Administrator, Civil Service (Medical): No    Lack of Transportation (Non-Medical): No  Physical Activity: Inactive (01/07/2021)   Exercise Vital Sign    Days of Exercise per Week: 0 days    Minutes of Exercise per Session: 0 min  Stress: Stress Concern Present (07/03/2022)   [employer]  of Occupational Health - Occupational Stress Questionnaire    Feeling of Stress : Very much  Social Connections: Moderately Integrated (07/03/2022)   Social Connection and Isolation Panel [NHANES]    Frequency of Communication with Friends and Family: More than three times a week    Frequency of Social Gatherings with Friends and Family: Once a week    Attends Religious Services: 1 to 4 times per year  Active Member of Clubs or Organizations: No    Attends Banker Meetings: Never    Marital Status: Married    Allergies:  Allergies  Allergen Reactions   Prozac  [Fluoxetine ]     Suicidal thoughts   Topiramate Other (See Comments)    Topamax-dizziness   Lexapro  [Escitalopram  Oxalate] Other (See Comments)    Flat affect, No emotions    Metabolic Disorder Labs: Lab Results  Component Value Date   HGBA1C 5.5 10/01/2023   MPG 108 01/07/2013   Lab Results  Component Value Date   PROLACTIN 13.2 06/12/2016   Lab Results  Component Value Date   CHOL 204 (H) 01/07/2021   TRIG 132 01/07/2021   HDL 72 01/07/2021   CHOLHDL 2.8 01/07/2021   VLDL 31 01/07/2013   LDLCALC 109 (H) 01/07/2021   LDLCALC 80 10/08/2018   Lab Results  Component Value Date   TSH 0.65 10/01/2023   TSH 4.080 01/07/2021    Therapeutic Level Labs: Lab Results  Component Value Date   LITHIUM  0.45 (L) 01/27/2020   LITHIUM  0.24 (L) 09/03/2019   No results found for: "VALPROATE" No results found for: "CBMZ"  Current Medications: Current Outpatient Medications  Medication Sig Dispense Refill   diazepam  (VALIUM ) 10 MG tablet Take 1 tablet (10 mg total) by mouth 4 (four) times daily. 120 tablet 0   gabapentin  (NEURONTIN ) 100 MG capsule One tab PO qHS for a week, then BID for a week, then TID. 90 capsule 2   ALPRAZolam  (XANAX ) 1 MG tablet Take 1 tablet (1 mg total) by mouth 4 (four) times daily. 120 tablet 2   amphetamine -dextroamphetamine  (ADDERALL) 20 MG tablet Take 1 tablet (20 mg total) by  mouth 3 (three) times daily. 90 tablet 0   amphetamine -dextroamphetamine  (ADDERALL) 20 MG tablet Take 1 tablet (20 mg total) by mouth 3 (three) times daily. 90 tablet 0   carbamazepine  (TEGRETOL ) 200 MG tablet Take 1 tablet (200 mg total) by mouth 2 (two) times daily. 60 tablet 2   citalopram  (CELEXA ) 20 MG tablet Take 1 tablet (20 mg total) by mouth 2 (two) times daily. 60 tablet 2   hyoscyamine  (LEVSIN) 0.125 MG tablet Take 1 tablet (0.125 mg total) by mouth every 6 (six) hours as needed for cramping. 90 tablet 3   ibuprofen  (ADVIL ,MOTRIN ) 200 MG tablet Take 400-600 mg by mouth every 6 (six) hours as needed for mild pain or moderate pain.     meloxicam  (MOBIC ) 7.5 MG tablet Take 1 tablet (7.5 mg total) by mouth daily. 30 tablet 0   traZODone  (DESYREL ) 100 MG tablet Take 1 tablet (100 mg total) by mouth at bedtime. 30 tablet 2   Vitamin D , Ergocalciferol , (DRISDOL ) 1.25 MG (50000 UNIT) CAPS capsule Take 1 capsule (50,000 Units total) by mouth every 7 (seven) days. 12 capsule 0   No current facility-administered medications for this visit.     Musculoskeletal: Strength & Muscle Tone: within normal limits Gait & Station: normal Patient leans: N/A  Psychiatric Specialty Exam: Review of Systems  Psychiatric/Behavioral:  Positive for sleep disturbance. The patient is nervous/anxious.   All other systems reviewed and are negative.   There were no vitals taken for this visit.There is no height or weight on file to calculate BMI.  General Appearance: Casual and Fairly Groomed  Eye Contact:  Good  Speech:  Clear and Coherent  Volume:  Normal  Mood:  Anxious and Irritable  Affect:  Flat  Thought Process:  Goal Directed  Orientation:  Full (Time, Place, and Person)  Thought Content: Rumination   Suicidal Thoughts:  No  Homicidal Thoughts:  No  Memory:  Immediate;   Good Recent;   Good Remote;   Good  Judgement:  Good  Insight:  Fair  Psychomotor Activity:  Normal  Concentration:   Concentration: Good and Attention Span: Good  Recall:  Good  Fund of Knowledge: Good  Language: Good  Akathisia:  No  Handed:  Right  AIMS (if indicated): not done  Assets:  Communication Skills Desire for Improvement Physical Health Resilience Social Support Talents/Skills  ADL's:  Intact  Cognition: WNL  Sleep:  Poor   Screenings: GAD-7    Advertising copywriter from 12/07/2023 in [facility] at [place] Office Visit from 07/03/2022 in [facility] for [facility] Healthcare at [facility] Counselor from 05/31/2021 in [facility] at [place] Office Visit from 01/07/2021 in [facility] for [facility] Healthcare at [facility]  Total GAD-7 Score 21 21 20 16       Mini-Mental    Flowsheet Row Office Visit from 10/25/2016 in [facility] Neurology  Total Score (max 30 points ) 29      PHQ2-9    Flowsheet Row Counselor from 12/07/2023 in [facility] at [place] Office Visit from 07/03/2022 in [facility] for [facility] Healthcare at [facility] Video Visit from 06/29/2022 in [facility] at [place] Video Visit from 04/11/2022 in [facility] Outpatient Behavioral Health at [place] Video Visit from 03/03/2022 in [facility] at [facility] Total Score 3 6 1 2 5   PHQ-9 Total Score 14 21 -- 6 13      Flowsheet Row ED from 09/21/2023 in [facility] Emergency Department at [facility] ED from 07/10/2023 in [facility] Urgent Care at [place] ED from 03/13/2023 in [facility] Urgent Care at [facility] RISK CATEGORY No Risk No Risk No Risk        Assessment and Plan: This patient is a 43 year old female with a history of depression, chronic fatigue, social anxiety, ADD and borderline personality disorder.  She is still not sleeping well so we will try trazodone  100 mg at bedtime.  Instead of Xanax  we  will switch to Valium  10 mg 4 times daily for anxiety.  She will continue Celexa  20 mg twice daily for depression, Adderall 20 mg up to 3 times daily for ADHD and Tegretol  200 mg twice daily for mood stabilization.  She will return to see me in 4 weeks  Collaboration of Care: Collaboration of Care: Referral or follow-up with counselor/therapist AEB patient will continue therapy with Secundino Dach in our office  Patient/Guardian was advised Release of Information must be obtained prior to any record release in order to collaborate their care with an outside provider. Patient/Guardian was advised if they have not already done so to contact the registration department to sign all necessary forms in order for us  to release information regarding their care.   Consent: Patient/Guardian gives verbal consent for treatment and assignment of benefits for services provided during this visit. Patient/Guardian expressed understanding and agreed to proceed.    Destiny Annas, MD 01/31/2024, 2:09 PM

## 2024-02-14 ENCOUNTER — Ambulatory Visit (INDEPENDENT_AMBULATORY_CARE_PROVIDER_SITE_OTHER)

## 2024-02-14 ENCOUNTER — Ambulatory Visit: Payer: Medicare Other | Attending: Rheumatology | Admitting: Rheumatology

## 2024-02-14 ENCOUNTER — Encounter: Payer: Self-pay | Admitting: Rheumatology

## 2024-02-14 VITALS — BP 128/89 | HR 98 | Resp 17 | Ht 65.0 in | Wt 145.2 lb

## 2024-02-14 DIAGNOSIS — E049 Nontoxic goiter, unspecified: Secondary | ICD-10-CM

## 2024-02-14 DIAGNOSIS — R768 Other specified abnormal immunological findings in serum: Secondary | ICD-10-CM

## 2024-02-14 DIAGNOSIS — E559 Vitamin D deficiency, unspecified: Secondary | ICD-10-CM

## 2024-02-14 DIAGNOSIS — G90A Postural orthostatic tachycardia syndrome (POTS): Secondary | ICD-10-CM

## 2024-02-14 DIAGNOSIS — M79641 Pain in right hand: Secondary | ICD-10-CM

## 2024-02-14 DIAGNOSIS — R5382 Chronic fatigue, unspecified: Secondary | ICD-10-CM

## 2024-02-14 DIAGNOSIS — F4310 Post-traumatic stress disorder, unspecified: Secondary | ICD-10-CM

## 2024-02-14 DIAGNOSIS — G8929 Other chronic pain: Secondary | ICD-10-CM

## 2024-02-14 DIAGNOSIS — M542 Cervicalgia: Secondary | ICD-10-CM | POA: Diagnosis not present

## 2024-02-14 DIAGNOSIS — E038 Other specified hypothyroidism: Secondary | ICD-10-CM

## 2024-02-14 DIAGNOSIS — M25551 Pain in right hip: Secondary | ICD-10-CM | POA: Diagnosis not present

## 2024-02-14 DIAGNOSIS — M797 Fibromyalgia: Secondary | ICD-10-CM

## 2024-02-14 DIAGNOSIS — R000 Tachycardia, unspecified: Secondary | ICD-10-CM

## 2024-02-14 DIAGNOSIS — Z8269 Family history of other diseases of the musculoskeletal system and connective tissue: Secondary | ICD-10-CM

## 2024-02-14 DIAGNOSIS — I7300 Raynaud's syndrome without gangrene: Secondary | ICD-10-CM

## 2024-02-14 DIAGNOSIS — F32A Depression, unspecified: Secondary | ICD-10-CM

## 2024-02-14 DIAGNOSIS — F17200 Nicotine dependence, unspecified, uncomplicated: Secondary | ICD-10-CM

## 2024-02-14 DIAGNOSIS — F0781 Postconcussional syndrome: Secondary | ICD-10-CM

## 2024-02-14 DIAGNOSIS — K30 Functional dyspepsia: Secondary | ICD-10-CM

## 2024-02-14 DIAGNOSIS — F902 Attention-deficit hyperactivity disorder, combined type: Secondary | ICD-10-CM

## 2024-02-14 DIAGNOSIS — E538 Deficiency of other specified B group vitamins: Secondary | ICD-10-CM

## 2024-02-14 DIAGNOSIS — M79642 Pain in left hand: Secondary | ICD-10-CM

## 2024-02-14 DIAGNOSIS — M2550 Pain in unspecified joint: Secondary | ICD-10-CM | POA: Diagnosis not present

## 2024-02-14 DIAGNOSIS — R519 Headache, unspecified: Secondary | ICD-10-CM

## 2024-02-14 DIAGNOSIS — M545 Low back pain, unspecified: Secondary | ICD-10-CM

## 2024-02-14 DIAGNOSIS — F419 Anxiety disorder, unspecified: Secondary | ICD-10-CM

## 2024-02-14 DIAGNOSIS — R5381 Other malaise: Secondary | ICD-10-CM

## 2024-02-14 DIAGNOSIS — Z820 Family history of epilepsy and other diseases of the nervous system: Secondary | ICD-10-CM

## 2024-02-14 DIAGNOSIS — Z87891 Personal history of nicotine dependence: Secondary | ICD-10-CM

## 2024-02-14 NOTE — Patient Instructions (Signed)
 Cervical Strain and Sprain Rehab Ask your health care provider which exercises are safe for you. Do exercises exactly as told by your health care provider and adjust them as directed. It is normal to feel mild stretching, pulling, tightness, or discomfort as you do these exercises. Stop right away if you feel sudden pain or your pain gets worse. Do not begin these exercises until told by your health care provider. Stretching and range-of-motion exercises Cervical side bending  Using good posture, sit on a stable chair or stand up. Without moving your shoulders, slowly tilt your left / right ear to your shoulder until you feel a stretch in the neck muscles on the opposite side. You should be looking straight ahead. Hold for __________ seconds. Repeat with the other side of your neck. Repeat __________ times. Complete this exercise __________ times a day. Cervical rotation  Using good posture, sit on a stable chair or stand up. Slowly turn your head to the side as if you are looking over your left / right shoulder. Keep your eyes level with the ground. Stop when you feel a stretch along the side and the back of your neck. Hold for __________ seconds. Repeat this by turning to your other side. Repeat __________ times. Complete this exercise __________ times a day. Thoracic extension and pectoral stretch  Roll a towel or a small blanket so it is about 4 inches (10 cm) in diameter. Lie down on your back on a firm surface. Put the towel in the middle of your back across your spine. It should not be under your shoulder blades. Put your hands behind your head and let your elbows fall out to your sides. Hold for __________ seconds. Repeat __________ times. Complete this exercise __________ times a day. Strengthening exercises Upper cervical flexion  Lie on your back with a thin pillow behind your head or a small, rolled-up towel under your neck. Gently tuck your chin toward your chest and nod  your head down to look toward your feet. Do not lift your head off the pillow. Hold for __________ seconds. Release the tension slowly. Relax your neck muscles completely before you repeat this exercise. Repeat __________ times. Complete this exercise __________ times a day. Cervical extension  Stand about 6 inches (15 cm) away from a wall, with your back facing the wall. Place a soft object, about 6-8 inches (15-20 cm) in diameter, between the back of your head and the wall. A soft object could be a small pillow, a ball, or a folded towel. Gently tilt your head back and press into the soft object. Keep your jaw and forehead relaxed. Hold for __________ seconds. Release the tension slowly. Relax your neck muscles completely before you repeat this exercise. Repeat __________ times. Complete this exercise __________ times a day. Posture and body mechanics Body mechanics refer to the movements and positions of your body while you do your daily activities. Posture is part of body mechanics. Good posture and healthy body mechanics can help to relieve stress in your body's tissues and joints. Good posture means that your spine is in its natural S-curve position (your spine is neutral), your shoulders are pulled back slightly, and your head is not tipped forward. The following are general guidelines for using improved posture and body mechanics in your everyday activities. Sitting  When sitting, keep your spine neutral and keep your feet flat on the floor. Use a footrest, if needed, and keep your thighs parallel to the floor. Avoid rounding  your shoulders. Avoid tilting your head forward. When working at a desk or a computer, keep your desk at a height where your hands are slightly lower than your elbows. Slide your chair under your desk so you are close enough to maintain good posture. When working at a computer, place your monitor at a height where you are looking straight ahead and you do not have to  tilt your head forward or downward to look at the screen. Standing  When standing, keep your spine neutral and keep your feet about hip-width apart. Keep a slight bend in your knees. Your ears, shoulders, and hips should line up. When you do a task in which you stand in one place for a long time, place one foot up on a stable object that is 2-4 inches (5-10 cm) high, such as a footstool. This helps keep your spine neutral. Resting When lying down and resting, avoid positions that are most painful for you. Try to support your neck in a neutral position. You can use a contour pillow or a small rolled-up towel. Your pillow should support your neck but not push on it. This information is not intended to replace advice given to you by your health care provider. Make sure you discuss any questions you have with your health care provider. Document Revised: 01/22/2023 Document Reviewed: 04/10/2022 [employer] Patient Education  2024 [employer]  Thoracic Strain Rehab Ask your health care provider which exercises are safe for you. Do exercises exactly as told by your provider and adjust them as directed. It is normal to feel mild stretching, pulling, tightness, or discomfort as you do these exercises. Stop right away if you feel sudden pain or your pain gets worse. Do not begin these exercises until told by your provider. Stretching and range-of-motion exercise This exercise warms up your muscles and joints and improves the movement and flexibility of your back and shoulders. This exercise also helps to relieve pain. Chest and spine stretch  Lie down on your back on a firm surface. Roll a towel or a small blanket so it is about 4 inches (10 cm) in diameter. Put the towel under the middle of your back so it is under your spine, but not under your shoulder blades. Put your hands behind your head and let your elbows fall to your sides. This will increase your stretch. Take a deep breath (inhale). Hold for  __________ seconds. Relax after you breathe out (exhale). Repeat __________ times. Complete this exercise __________ times a day. Strengthening exercises These exercises build strength and endurance in your back and your shoulder blade muscles. Endurance is the ability to use your muscles for a long time, even after they get tired. Alternating arm and leg raises  Get on your hands and knees on a firm surface. If you are on a hard floor, you may want to use padding, such as an exercise mat, to cushion your knees. Line up your arms and legs. Your hands should be directly below your shoulders, and your knees should be directly below your hips. Lift your left leg behind you. At the same time, raise your right arm and straighten it in front of you. Do not lift your leg higher than your hip. Do not lift your arm higher than your shoulder. Keep your abdominal and back muscles tight. Keep your hips facing the ground. Do not arch your back. Carefully stay balanced. Do not hold your breath. Hold for __________ seconds. Slowly return to the starting  position and repeat with your right leg and your left arm. Repeat __________ times. Complete this exercise __________ times a day. Straight arm rows This exercise is also called the shoulder extension exercise. Stand with your feet shoulder width apart. Secure an exercise band to a stable object in front of you so the band is at or above shoulder height. Hold one end of the exercise band in each hand. Straighten your elbows and lift your hands up to shoulder height. Step back, away from the secured end of the exercise band, until the band stretches. Squeeze your shoulder blades together and pull your hands down to the sides of your thighs. Stop when your hands are straight down by your sides. This is shoulder extension. Do not let your hands go behind your body. Hold for __________ seconds. Slowly return to the starting position. Repeat __________  times. Complete this exercise __________ times a day. Rowing scapular retraction This is an exercise in which the shoulder blades (scapulae) are pulled toward each other (retraction). Sit in a stable chair without armrests, or stand up. Secure an exercise band to a stable object in front of you so the band is at shoulder height. Hold one end of the exercise band in each hand. Your palms should face toward each other. Bring your arms out straight in front of you. Step back, away from the secured end of the exercise band, until the band stretches. Pull the band backward. As you do this, bend your elbows and squeeze your shoulder blades together, but avoid letting the rest of your body move. Do not shrug your shoulders upward while you do this. Stop when your elbows are at your sides or slightly behind your body. Hold for __________ seconds. Slowly straighten your arms to return to the starting position. Repeat __________ times. Complete this exercise __________ times a day. Posture and body mechanics Good posture and healthy body mechanics can help to relieve stress in your body's tissues and joints. Body mechanics refers to the movements and positions of your body while you do your daily activities. Posture is part of body mechanics. Good posture means: Your spine is in its natural S-curve position (neutral). Your shoulders are pulled back slightly. Your head is not tipped forward. Follow these guidelines to improve your posture and body mechanics in your everyday activities. Standing  When standing, keep your spine neutral and your feet about hip width apart. Keep a slight bend in your knees. Your ears, shoulders, and hips should line up with each other. When you do a task in which you lean forward while standing in one place for a long time, place one foot up on a stable object that is 2-4 inches (5-10 cm) high, such as a footstool. This helps keep your spine neutral. Sitting  When sitting,  keep your spine neutral and keep your feet flat on the floor. Use a footrest if needed. Keep your thighs parallel to the floor. Avoid rounding your shoulders, and avoid tilting your head forward. When working at a desk or a computer, keep your desk at a height where your hands are slightly lower than your elbows. Slide your chair under your desk so you are close enough to maintain good posture. When working at a computer, place your monitor at a height where you are looking straight ahead and you do not have to tilt your head forward or downward to look at the screen. Resting When lying down and resting, avoid positions that are most  painful for you. If you have pain with activities such as sitting, bending, stooping, or squatting (flexion-basedactivities), lie in a position in which your body does not bend very much. For example, avoid curling up on your side with your arms and knees near your chest (fetal position). If you have pain with activities such as standing for a long time or reaching with your arms (extension-basedactivities), lie with your spine in a neutral position and bend your knees slightly. Try the following positions: Lie on your side with a pillow between your knees. Lie on your back with a pillow under your knees.  Lifting  When lifting objects, keep your feet at least shoulder width apart and tighten your abdominal muscles. Bend your knees and hips and keep your spine neutral. It is important to lift using the strength of your legs, not your back. Do not lock your knees straight out. Always ask for help to lift heavy or awkward objects. This information is not intended to replace advice given to you by your health care provider. Make sure you discuss any questions you have with your health care provider. Document Revised: 03/02/2023 Document Reviewed: 05/08/2022 [employer] Patient Education  2024 [employer] Low Back Sprain or Strain Rehab Ask your health care provider which  exercises are safe for you. Do exercises exactly as told by your health care provider and adjust them as directed. It is normal to feel mild stretching, pulling, tightness, or discomfort as you do these exercises. Stop right away if you feel sudden pain or your pain gets worse. Do not begin these exercises until told by your health care provider. Stretching and range-of-motion exercises These exercises warm up your muscles and joints and improve the movement and flexibility of your back. These exercises also help to relieve pain, numbness, and tingling. Lumbar rotation  Lie on your back on a firm bed or the floor with your knees bent. Straighten your arms out to your sides so each arm forms a 90-degree angle (right angle) with a side of your body. Slowly move (rotate) both of your knees to one side of your body until you feel a stretch in your lower back (lumbar). Try not to let your shoulders lift off the floor. Hold this position for __________ seconds. Tense your abdominal muscles and slowly move your knees back to the starting position. Repeat this exercise on the other side of your body. Repeat __________ times. Complete this exercise __________ times a day. Single knee to chest  Lie on your back on a firm bed or the floor with both legs straight. Bend one of your knees. Use your hands to move your knee up toward your chest until you feel a gentle stretch in your lower back and buttock. Hold your leg in this position by holding on to the front of your knee. Keep your other leg as straight as possible. Hold this position for __________ seconds. Slowly return to the starting position. Repeat with your other leg. Repeat __________ times. Complete this exercise __________ times a day. Prone extension on elbows  Lie on your abdomen on a firm bed or the floor (prone position). Prop yourself up on your elbows. Use your arms to help lift your chest up until you feel a gentle stretch in your  abdomen and your lower back. This will place some of your body weight on your elbows. If this is uncomfortable, try stacking pillows under your chest. Your hips should stay down, against the surface that you  are lying on. Keep your hip and back muscles relaxed. Hold this position for __________ seconds. Slowly relax your upper body and return to the starting position. Repeat __________ times. Complete this exercise __________ times a day. Strengthening exercises These exercises build strength and endurance in your back. Endurance is the ability to use your muscles for a long time, even after they get tired. Pelvic tilt This exercise strengthens the muscles that lie deep in the abdomen. Lie on your back on a firm bed or the floor with your legs extended. Bend your knees so they are pointing toward the ceiling and your feet are flat on the floor. Tighten your lower abdominal muscles to press your lower back against the floor. This motion will tilt your pelvis so your tailbone points up toward the ceiling instead of pointing to your feet or the floor. To help with this exercise, you may place a small towel under your lower back and try to push your back into the towel. Hold this position for __________ seconds. Let your muscles relax completely before you repeat this exercise. Repeat __________ times. Complete this exercise __________ times a day. Alternating arm and leg raises  Get on your hands and knees on a firm surface. If you are on a hard floor, you may want to use padding, such as an exercise mat, to cushion your knees. Line up your arms and legs. Your hands should be directly below your shoulders, and your knees should be directly below your hips. Lift your left leg behind you. At the same time, raise your right arm and straighten it in front of you. Do not lift your leg higher than your hip. Do not lift your arm higher than your shoulder. Keep your abdominal and back muscles  tight. Keep your hips facing the ground. Do not arch your back. Keep your balance carefully, and do not hold your breath. Hold this position for __________ seconds. Slowly return to the starting position. Repeat with your right leg and your left arm. Repeat __________ times. Complete this exercise __________ times a day. Abdominal set with straight leg raise  Lie on your back on a firm bed or the floor. Bend one of your knees and keep your other leg straight. Tense your abdominal muscles and lift your straight leg up, 4-6 inches (10-15 cm) off the ground. Keep your abdominal muscles tight and hold this position for __________ seconds. Do not hold your breath. Do not arch your back. Keep it flat against the ground. Keep your abdominal muscles tense as you slowly lower your leg back to the starting position. Repeat with your other leg. Repeat __________ times. Complete this exercise __________ times a day. Single leg lower with bent knees Lie on your back on a firm bed or the floor. Tense your abdominal muscles and lift your feet off the floor, one foot at a time, so your knees and hips are bent in 90-degree angles (right angles). Your knees should be over your hips and your lower legs should be parallel to the floor. Keeping your abdominal muscles tense and your knee bent, slowly lower one of your legs so your toe touches the ground. Lift your leg back up to return to the starting position. Do not hold your breath. Do not let your back arch. Keep your back flat against the ground. Repeat with your other leg. Repeat __________ times. Complete this exercise __________ times a day. Posture and body mechanics Good posture and healthy body mechanics can  help to relieve stress in your body's tissues and joints. Body mechanics refers to the movements and positions of your body while you do your daily activities. Posture is part of body mechanics. Good posture means: Your spine is in its  natural S-curve position (neutral). Your shoulders are pulled back slightly. Your head is not tipped forward (neutral). Follow these guidelines to improve your posture and body mechanics in your everyday activities. Standing  When standing, keep your spine neutral and your feet about hip-width apart. Keep a slight bend in your knees. Your ears, shoulders, and hips should line up. When you do a task in which you stand in one place for a long time, place one foot up on a stable object that is 2-4 inches (5-10 cm) high, such as a footstool. This helps keep your spine neutral. Sitting  When sitting, keep your spine neutral and keep your feet flat on the floor. Use a footrest, if necessary, and keep your thighs parallel to the floor. Avoid rounding your shoulders, and avoid tilting your head forward. When working at a desk or a computer, keep your desk at a height where your hands are slightly lower than your elbows. Slide your chair under your desk so you are close enough to maintain good posture. When working at a computer, place your monitor at a height where you are looking straight ahead and you do not have to tilt your head forward or downward to look at the screen. Resting When lying down and resting, avoid positions that are most painful for you. If you have pain with activities such as sitting, bending, stooping, or squatting, lie in a position in which your body does not bend very much. For example, avoid curling up on your side with your arms and knees near your chest (fetal position). If you have pain with activities such as standing for a long time or reaching with your arms, lie with your spine in a neutral position and bend your knees slightly. Try the following positions: Lying on your side with a pillow between your knees. Lying on your back with a pillow under your knees. Lifting  When lifting objects, keep your feet at least shoulder-width apart and tighten your abdominal  muscles. Bend your knees and hips and keep your spine neutral. It is important to lift using the strength of your legs, not your back. Do not lock your knees straight out. Always ask for help to lift heavy or awkward objects. This information is not intended to replace advice given to you by your health care provider. Make sure you discuss any questions you have with your health care provider. Document Revised: 01/22/2023 Document Reviewed: 12/06/2020 [employer] Patient Education  2024 [employer] Iliotibial Band Syndrome Rehab Ask your health care provider which exercises are safe for you. Do exercises exactly as told by your provider and adjust them as told. It's normal to feel mild stretching, pulling, tightness, or discomfort as you do these exercises. Stop right away if you feel sudden pain or your pain gets a lot worse. Do not begin these exercises until told by your provider. Stretching and range-of-motion exercises These exercises warm up your muscles and joints. They also improve the movement and flexibility of your hip and pelvis. Quadriceps stretch, prone  Lie face down (prone) on a firm surface like a bed or padded floor. Bend your left / right knee. Reach back to hold your ankle or pant leg. If you can't reach your ankle or  pant leg, use a belt looped around your foot and grab the belt instead. Gently pull your heel toward your butt. Your knee should not slide out to the side. You should feel a stretch in the front of your thigh and knee, also called the quadriceps. Hold this position for __________ seconds. Repeat __________ times. Complete this exercise __________ times a day. Iliotibial band stretch The iliotibial band is a strip of tissue that runs along the outside of your hip down to your knee. Lie on your side with your left / right leg on top. Bend both knees and grab your left / right ankle. Stretch out your bottom arm to help you balance. Slowly bring your top knee back  so your thigh goes behind your back. Slowly lower your top leg toward the floor until you feel a gentle stretch on the outside of your left / right hip and thigh. If you don't feel a stretch and your knee won't go farther, place the heel of your other foot on top of your knee and pull your knee down toward the floor with your foot. Hold this position for __________ seconds. Repeat __________ times. Complete this exercise __________ times a day. Strengthening exercises These exercises build strength and endurance in your hip and pelvis. Endurance means your muscles can keep working even when they're tired. Straight leg raises, side-lying This exercise strengthens the muscles that rotate the leg at the hip and move it away from your body. These muscles are called hip abductors. Lie on your side with your left / right leg on top. Lie so your head, shoulder, hip, and knee line up. You can bend your bottom knee to help you balance. Roll your hips slightly forward so they're stacked directly over each other. Your left / right knee should face forward. Tense the muscles in your outer thigh and hip. Lift your top leg 4-6 inches (10-15 cm) off the ground. Hold this position for __________ seconds. Slowly lower your leg back down to the starting position. Let your muscles fully relax before doing this exercise again. Repeat __________ times. Complete this exercise __________ times a day. Leg raises, prone This exercise strengthens the muscles that move the hips backward. These muscles are called hip extensors. Lie face down (prone) on your bed or a firm surface. You can put a pillow under your hips for comfort and to support your lower back. Bend your left / right knee so your foot points straight up toward the ceiling. Keep the other leg straight and behind you. Squeeze your butt muscles. Lift your left / right thigh off the firm surface. Do not let your back arch. Tense your thigh muscle as hard as you  can without having more knee pain. Hold this position for __________ seconds. Slowly lower your leg to the starting position. Allow your leg to relax all the way. Repeat __________ times. Complete this exercise __________ times a day. Hip hike  Stand sideways on a bottom step. Place your feet so that your left / right leg is on the step, and the other foot is hanging off the side. If you need support for balance, hold onto a railing or wall. Keep your knees straight and your abdomen square, meaning your hips are level. Then, lift your left / right hip up toward the ceiling. Slowly let your leg that's hanging off the step lower towards the floor. Your foot should get closer to the ground. Do not lean or bend your knees  during this movement. Repeat __________ times. Complete this exercise __________ times a day. This information is not intended to replace advice given to you by your health care provider. Make sure you discuss any questions you have with your health care provider. Document Revised: 12/01/2022 Document Reviewed: 12/01/2022 Elsevier Patient Education  2024 [employer].

## 2024-02-15 ENCOUNTER — Telehealth: Payer: Self-pay

## 2024-02-15 ENCOUNTER — Ambulatory Visit (HOSPITAL_COMMUNITY): Admitting: Clinical

## 2024-02-15 NOTE — Telephone Encounter (Signed)
 Per Dr. Alvira Josephs, if labs are normal, patient can be called with results and does not need to follow up. She has already been advised of x-ray results. Thanks!

## 2024-02-16 LAB — PROTEIN / CREATININE RATIO, URINE
Creatinine, Urine: 161 mg/dL (ref 20–275)
Protein/Creat Ratio: 56 mg/g{creat} (ref 24–184)
Protein/Creatinine Ratio: 0.056 mg/mg{creat} (ref 0.024–0.184)
Total Protein, Urine: 9 mg/dL (ref 5–24)

## 2024-02-16 LAB — ANTI-SCLERODERMA ANTIBODY: Scleroderma (Scl-70) (ENA) Antibody, IgG: <1 AI

## 2024-02-16 LAB — ANTI-DNA ANTIBODY, DOUBLE-STRANDED: ds DNA Ab: 1 [IU]/mL

## 2024-02-16 LAB — ANTI-SMITH ANTIBODY: ENA SM Ab Ser-aCnc: <1 AI

## 2024-02-16 LAB — SJOGRENS SYNDROME-A EXTRACTABLE NUCLEAR ANTIBODY: SSA (Ro) (ENA) Antibody, IgG: <1 AI

## 2024-02-16 LAB — RNP ANTIBODY: Ribonucleic Protein(ENA) Antibody, IgG: <1 AI

## 2024-02-16 LAB — C3 AND C4
C3 Complement: 151 mg/dL (ref 83–193)
C4 Complement: 27 mg/dL (ref 15–57)

## 2024-02-16 LAB — SJOGRENS SYNDROME-B EXTRACTABLE NUCLEAR ANTIBODY: SSB (La) (ENA) Antibody, IgG: <1 AI

## 2024-02-17 ENCOUNTER — Ambulatory Visit: Payer: Self-pay | Admitting: Rheumatology

## 2024-02-17 NOTE — Progress Notes (Signed)
 Urine protein creatinine ratio normal, ENA panel which includes antibodies for scleroderma, mixed connective tissue disease, lupus, Sjogren's were all negative, complements were normal.  Please notify patient that labs do not indicate an autoimmune disease.  Please cancel the follow-up appointment.  Please forward labs to patient's PCP.

## 2024-02-19 ENCOUNTER — Encounter: Payer: Self-pay | Admitting: *Deleted

## 2024-02-19 NOTE — Telephone Encounter (Signed)
 See lab note for for details.

## 2024-02-22 ENCOUNTER — Ambulatory Visit: Admitting: Obstetrics & Gynecology

## 2024-02-22 ENCOUNTER — Other Ambulatory Visit (HOSPITAL_COMMUNITY)
Admission: RE | Admit: 2024-02-22 | Discharge: 2024-02-22 | Disposition: A | Discharge status: Home / Self Care | Source: Ambulatory Visit | Attending: Obstetrics & Gynecology | Admitting: Obstetrics & Gynecology

## 2024-02-22 ENCOUNTER — Encounter: Payer: Self-pay | Admitting: Obstetrics & Gynecology

## 2024-02-22 VITALS — BP 146/81 | Ht 65.0 in | Wt 153.0 lb

## 2024-02-22 DIAGNOSIS — Z124 Encounter for screening for malignant neoplasm of cervix: Secondary | ICD-10-CM | POA: Diagnosis not present

## 2024-02-22 DIAGNOSIS — Z1151 Encounter for screening for human papillomavirus (HPV): Secondary | ICD-10-CM | POA: Insufficient documentation

## 2024-02-22 DIAGNOSIS — N938 Other specified abnormal uterine and vaginal bleeding: Secondary | ICD-10-CM

## 2024-02-22 DIAGNOSIS — N946 Dysmenorrhea, unspecified: Secondary | ICD-10-CM

## 2024-02-22 DIAGNOSIS — Z01419 Encounter for gynecological examination (general) (routine) without abnormal findings: Secondary | ICD-10-CM | POA: Insufficient documentation

## 2024-02-22 LAB — POCT HEMOGLOBIN: Hemoglobin: 13.6 g/dL (ref 11–14.6)

## 2024-02-22 MED ORDER — MEGESTROL ACETATE 40 MG PO TABS: ORAL_TABLET | ORAL | 3 refills | Status: DC

## 2024-02-22 NOTE — Addendum Note (Signed)
 Addended by: Myrl Askew on: 02/22/2024 12:37 PM   Modules accepted: Orders

## 2024-02-22 NOTE — Progress Notes (Signed)
 Chief Complaint  Patient presents with   discuss hysterectomy      43 y.o. [place] Patient's last menstrual period was 02/18/2024. The current method of family planning is none.  Outpatient Encounter Medications as of 02/22/2024  Medication Sig   Acetaminophen  (TYLENOL  PO) Take by mouth as needed.   amphetamine -dextroamphetamine  (ADDERALL) 20 MG tablet Take 1 tablet (20 mg total) by mouth 3 (three) times daily.   carbamazepine  (TEGRETOL ) 200 MG tablet Take 1 tablet (200 mg total) by mouth 2 (two) times daily.   citalopram  (CELEXA ) 20 MG tablet Take 1 tablet (20 mg total) by mouth 2 (two) times daily.   diazepam  (VALIUM ) 10 MG tablet Take 1 tablet (10 mg total) by mouth 4 (four) times daily.   ibuprofen  (ADVIL ,MOTRIN ) 200 MG tablet Take 400-600 mg by mouth every 6 (six) hours as needed for mild pain or moderate pain.   megestrol  (MEGACE ) 40 MG tablet 3 tablets a day for 5 days, 2 tablets a day for 5 days then 1 tablet daily   ALPRAZolam  (XANAX ) 1 MG tablet Take 1 tablet (1 mg total) by mouth 4 (four) times daily. (Patient not taking: Reported on 02/22/2024)   amphetamine -dextroamphetamine  (ADDERALL) 20 MG tablet Take 1 tablet (20 mg total) by mouth 3 (three) times daily. (Patient not taking: Reported on 02/22/2024)   hyoscyamine  (LEVSIN) 0.125 MG tablet Take 1 tablet (0.125 mg total) by mouth every 6 (six) hours as needed for cramping. (Patient not taking: Reported on 02/22/2024)   meloxicam  (MOBIC ) 7.5 MG tablet Take 1 tablet (7.5 mg total) by mouth daily. (Patient not taking: Reported on 02/22/2024)   propranolol  (INDERAL ) 20 MG tablet Take 20 mg by mouth as needed. (Patient not taking: Reported on 02/22/2024)   traZODone  (DESYREL ) 100 MG tablet Take 1 tablet (100 mg total) by mouth at bedtime. (Patient not taking: Reported on 02/22/2024)   [DISCONTINUED] gabapentin  (NEURONTIN ) 100 MG capsule One tab PO qHS for a week, then BID for a week, then TID. (Patient not taking: Reported on  02/22/2024)   [DISCONTINUED] Vitamin D , Ergocalciferol , (DRISDOL ) 1.25 MG (50000 UNIT) CAPS capsule Take 1 capsule (50,000 Units total) by mouth every 7 (seven) days. (Patient not taking: Reported on 02/22/2024)   No facility-administered encounter medications on file as of 02/22/2024.    Subjective Patient presents today complaining of worsening periods which she dates significantly February She states since February she has bled all except for about 10 to 14 days Sometimes the bleeding is heavy sometimes is light sometimes clots  cramping has been worse the last couple of weeks She has a history of abnormal uterine bleeding dating back documented back in 2016 She is on any birth control She has been megestrol  episodically through the years, currently not taking any  Past Medical History:  Diagnosis Date   Abnormal Papanicolaou smear of cervix with positive human papilloma virus (HPV) test 10/11/2018   LSIL +HPV, get colpo___   Abnormal uterine bleeding (AUB) 12/09/2014   Anxiety    Arthritis    Depression    DJD (degenerative joint disease)    Dumping syndrome    Dysmenorrhea 07/15/2014   Fatigue 12/24/2012   Fibromyalgia diagnosed April 2016   Headache    Hx of migraine headaches 12/24/2012   Inappropriate sinus tachycardia (HCC)    Irregular menstrual bleeding 07/15/2014   Pelvic pain in female 03/03/2016   Pneumothorax, spontaneous, tension    Post concussion syndrome    POTS (postural orthostatic  tachycardia syndrome)    PTSD (post-traumatic stress disorder)    Scoliosis    Thyroid  disease    Tick bite 03/03/2016   Vitamin B 12 deficiency     Past Surgical History:  Procedure Laterality Date   CESAREAN SECTION     COLONOSCOPY     endooscopy     KNEE SURGERY Left    ROTATOR CUFF REPAIR Left     OB History     Gravida  3   Para  2   Term  2   Preterm      AB  1   Living  3      SAB      IAB      Ectopic      Multiple  1   Live Births  3            Allergies  Allergen Reactions   Prozac  [Fluoxetine ]     Suicidal thoughts   Topiramate Other (See Comments)    Topamax-dizziness   Lexapro  [Escitalopram  Oxalate] Other (See Comments)    Flat affect, No emotions    Social History   Socioeconomic History   Marital status: Married    Spouse name: Not on file   Number of children: 3   Years of education: Not on file   Highest education level: Not on file  Occupational History   Not on file  Tobacco Use   Smoking status: Former    Current packs/day: 0.00    Average packs/day: 1 pack/day for 24.5 years (24.5 ttl pk-yrs)    Types: Cigarettes    Start date: 03/13/1997    Quit date: 09/25/2021    Years since quitting: 2.4    Passive exposure: Past   Smokeless tobacco: Never   Tobacco comments:    "working on it" Was smoking 2.5 packs a day  Vaping Use   Vaping status: Former  Substance and Sexual Activity   Alcohol use: No   Drug use: No   Sexual activity: Yes    Partners: Male    Birth control/protection: None, Condom  Other Topics Concern   Not on file  Social History Narrative   Not on file   Social Drivers of Health   Financial Resource Strain: High Risk (07/03/2022)   Overall Financial Resource Strain (CARDIA)    Difficulty of Paying Living Expenses: Very hard  Food Insecurity: Food Insecurity Present (07/03/2022)   Hunger Vital Sign    Worried About Running Out of Food in the Last Year: Sometimes true    Ran Out of Food in the Last Year: Never true  Transportation Needs: No Transportation Needs (07/03/2022)   PRAPARE - Administrator, Civil Service (Medical): No    Lack of Transportation (Non-Medical): No  Physical Activity: Inactive (01/07/2021)   Exercise Vital Sign    Days of Exercise per Week: 0 days    Minutes of Exercise per Session: 0 min  Stress: Stress Concern Present (07/03/2022)   [employer] of Occupational Health - Occupational Stress Questionnaire    Feeling of Stress : Very  much  Social Connections: Moderately Integrated (07/03/2022)   Social Connection and Isolation Panel [NHANES]    Frequency of Communication with Friends and Family: More than three times a week    Frequency of Social Gatherings with Friends and Family: Once a week    Attends Religious Services: 1 to 4 times per year    Active Member of Clubs or Organizations: No  Attends Banker Meetings: Never    Marital Status: Married    Family History  Problem Relation Age of Onset   Hypertension Father    Atrial fibrillation Father    Alcohol abuse Father    Heart disease Father    Other Father        Intraductal Papillary Mucinous Neoplasm (IPMN)   High Cholesterol Father    Anxiety disorder Maternal Aunt    Anxiety disorder Maternal Uncle    Bipolar disorder Maternal Uncle    Breast cancer Maternal Uncle        CML   Cancer Maternal Grandmother        skin    Heart disease Maternal Grandmother    Other Maternal Grandmother        had thyroid  removed   Breast cancer Maternal Grandmother    Cancer Maternal Grandfather        bladder,lung   Heart disease Paternal Grandfather    COPD Paternal Grandfather    Hypertension Paternal Grandfather    Diabetes Paternal Grandfather    Stroke Paternal Grandfather    Healthy Daughter    Healthy Daughter    Healthy Son    Leukemia Other    Colon cancer Other        lung-2 maternal great uncles    Medications:       Current Outpatient Medications:    Acetaminophen  (TYLENOL  PO), Take by mouth as needed., Disp: , Rfl:    amphetamine -dextroamphetamine  (ADDERALL) 20 MG tablet, Take 1 tablet (20 mg total) by mouth 3 (three) times daily., Disp: 90 tablet, Rfl: 0   carbamazepine  (TEGRETOL ) 200 MG tablet, Take 1 tablet (200 mg total) by mouth 2 (two) times daily., Disp: 60 tablet, Rfl: 2   citalopram  (CELEXA ) 20 MG tablet, Take 1 tablet (20 mg total) by mouth 2 (two) times daily., Disp: 60 tablet, Rfl: 2   diazepam  (VALIUM ) 10 MG  tablet, Take 1 tablet (10 mg total) by mouth 4 (four) times daily., Disp: 120 tablet, Rfl: 0   ibuprofen  (ADVIL ,MOTRIN ) 200 MG tablet, Take 400-600 mg by mouth every 6 (six) hours as needed for mild pain or moderate pain., Disp: , Rfl:    megestrol  (MEGACE ) 40 MG tablet, 3 tablets a day for 5 days, 2 tablets a day for 5 days then 1 tablet daily, Disp: 45 tablet, Rfl: 3   ALPRAZolam  (XANAX ) 1 MG tablet, Take 1 tablet (1 mg total) by mouth 4 (four) times daily. (Patient not taking: Reported on 02/22/2024), Disp: 120 tablet, Rfl: 2   amphetamine -dextroamphetamine  (ADDERALL) 20 MG tablet, Take 1 tablet (20 mg total) by mouth 3 (three) times daily. (Patient not taking: Reported on 02/22/2024), Disp: 90 tablet, Rfl: 0   hyoscyamine  (LEVSIN) 0.125 MG tablet, Take 1 tablet (0.125 mg total) by mouth every 6 (six) hours as needed for cramping. (Patient not taking: Reported on 02/22/2024), Disp: 90 tablet, Rfl: 3   meloxicam  (MOBIC ) 7.5 MG tablet, Take 1 tablet (7.5 mg total) by mouth daily. (Patient not taking: Reported on 02/22/2024), Disp: 30 tablet, Rfl: 0   propranolol  (INDERAL ) 20 MG tablet, Take 20 mg by mouth as needed. (Patient not taking: Reported on 02/22/2024), Disp: , Rfl:    traZODone  (DESYREL ) 100 MG tablet, Take 1 tablet (100 mg total) by mouth at bedtime. (Patient not taking: Reported on 02/22/2024), Disp: 30 tablet, Rfl: 2  Objective Blood pressure (!) 146/81, height 5\' 5"  (1.651 m), weight 153 lb (69.4 kg), last menstrual  period 02/18/2024.  General [facility] female NAD Vulva:  normal appearing vulva with no masses, tenderness or lesions Vagina:  normal mucosa, no discharge Cervix:  Normal no lesions Uterus:  normal size, contour, position, consistency, mobility, non-tender Adnexa: ovaries:present,  normal adnexa in size, nontender and no masses   Pertinent ROS No burning with urination, frequency or urgency No nausea, vomiting or diarrhea Nor fever chills or other constitutional  symptoms   Labs or studies Hemoglobin today    Impression + Management Plan: Diagnoses this Encounter::   ICD-10-CM   1. DUB (dysfunctional uterine bleeding)  N93.8     2. Dysmenorrhea  N94.6     3. Routine cervical smear  Z12.4 Cytology - PAP        Medications prescribed during  this encounter: Meds ordered this encounter  Medications   megestrol  (MEGACE ) 40 MG tablet    Sig: 3 tablets a day for 5 days, 2 tablets a day for 5 days then 1 tablet daily    Dispense:  45 tablet    Refill:  3    Labs or Scans Ordered during this encounter: No orders of the defined types were placed in this encounter.     Follow up Return for GYN sono, Follow up, with Dr Randolm Butte.

## 2024-02-26 ENCOUNTER — Other Ambulatory Visit: Payer: Self-pay | Admitting: Obstetrics & Gynecology

## 2024-02-26 ENCOUNTER — Other Ambulatory Visit (HOSPITAL_COMMUNITY): Payer: Self-pay | Admitting: Psychiatry

## 2024-02-26 ENCOUNTER — Encounter (HOSPITAL_COMMUNITY): Payer: Self-pay

## 2024-02-26 DIAGNOSIS — R102 Pelvic and perineal pain: Secondary | ICD-10-CM

## 2024-02-26 DIAGNOSIS — N946 Dysmenorrhea, unspecified: Secondary | ICD-10-CM

## 2024-02-26 DIAGNOSIS — N938 Other specified abnormal uterine and vaginal bleeding: Secondary | ICD-10-CM

## 2024-02-26 MED ORDER — ALPRAZOLAM 1 MG PO TABS: 1.0000 mg | ORAL_TABLET | Freq: Four times a day (QID) | ORAL | 2 refills | Status: DC

## 2024-02-28 ENCOUNTER — Ambulatory Visit (HOSPITAL_COMMUNITY)
Admission: RE | Admit: 2024-02-28 | Discharge: 2024-02-28 | Disposition: A | Discharge status: Home / Self Care | Source: Ambulatory Visit | Attending: Obstetrics & Gynecology | Admitting: Obstetrics & Gynecology

## 2024-02-28 ENCOUNTER — Encounter (HOSPITAL_COMMUNITY): Payer: Self-pay | Admitting: Psychiatry

## 2024-02-28 ENCOUNTER — Ambulatory Visit: Admitting: Obstetrics & Gynecology

## 2024-02-28 ENCOUNTER — Telehealth (HOSPITAL_COMMUNITY): Admitting: Psychiatry

## 2024-02-28 ENCOUNTER — Ambulatory Visit: Payer: Self-pay | Admitting: Obstetrics & Gynecology

## 2024-02-28 DIAGNOSIS — F332 Major depressive disorder, recurrent severe without psychotic features: Secondary | ICD-10-CM | POA: Diagnosis not present

## 2024-02-28 DIAGNOSIS — F411 Generalized anxiety disorder: Secondary | ICD-10-CM

## 2024-02-28 DIAGNOSIS — R102 Pelvic and perineal pain: Secondary | ICD-10-CM | POA: Diagnosis present

## 2024-02-28 DIAGNOSIS — N946 Dysmenorrhea, unspecified: Secondary | ICD-10-CM | POA: Insufficient documentation

## 2024-02-28 DIAGNOSIS — N938 Other specified abnormal uterine and vaginal bleeding: Secondary | ICD-10-CM | POA: Insufficient documentation

## 2024-02-28 DIAGNOSIS — F901 Attention-deficit hyperactivity disorder, predominantly hyperactive type: Secondary | ICD-10-CM

## 2024-02-28 LAB — CYTOLOGY - PAP
Comment: NEGATIVE
Diagnosis: UNDETERMINED — AB
High risk HPV: NEGATIVE

## 2024-02-28 MED ORDER — GABAPENTIN 300 MG PO CAPS: ORAL_CAPSULE | ORAL | 2 refills | Status: DC

## 2024-02-28 MED ORDER — AMPHETAMINE-DEXTROAMPHETAMINE 20 MG PO TABS: 20.0000 mg | ORAL_TABLET | Freq: Three times a day (TID) | ORAL | 0 refills | Status: DC

## 2024-02-28 MED ORDER — TRAZODONE HCL 50 MG PO TABS: 50.0000 mg | ORAL_TABLET | Freq: Every day | ORAL | 2 refills | Status: DC

## 2024-02-28 MED ORDER — CARBAMAZEPINE 200 MG PO TABS: 200.0000 mg | ORAL_TABLET | Freq: Two times a day (BID) | ORAL | 2 refills | Status: DC

## 2024-02-28 MED ORDER — CITALOPRAM HYDROBROMIDE 20 MG PO TABS: 20.0000 mg | ORAL_TABLET | Freq: Two times a day (BID) | ORAL | 2 refills | Status: DC

## 2024-02-28 NOTE — Progress Notes (Signed)
 Virtual Visit via Video Note  I connected with Destiny Orr on 02/28/24 at 11:00 AM EDT by a video enabled telemedicine application and verified that I am speaking with the correct person using two identifiers.  Location: Patient: home Provider: office   I discussed the limitations of evaluation and management by telemedicine and the availability of in person appointments. The patient expressed understanding and agreed to proceed.     I discussed the assessment and treatment plan with the patient. The patient was provided an opportunity to ask questions and all were answered. The patient agreed with the plan and demonstrated an understanding of the instructions.   The patient was advised to call back or seek an in-person evaluation if the symptoms worsen or if the condition fails to improve as anticipated.  I provided 20 minutes of non-face-to-face time during this encounter.   Alfredia Annas, MD  [facility] MD/PA/NP OP Progress Note  02/28/2024 11:21 AM Destiny Orr  MRN:  [id number]  Chief Complaint:  Chief Complaint  Patient presents with   Anxiety   Depression   ADD   Follow-up   HPI: This patient is a 43 year old separated white female who lives with her ex-husband and 1 son and 2 daughters and a granddaughter in [place].  She is on disability.  The patient returns for follow-up after 4 weeks regarding her depression anxiety and grief reaction.  As noted previously her father passed away on 2023/11/29.  Last time she felt very anxious and did not feel like the Xanax  was working so he switched to Valium .  However she sent a message in this week that this was not working that well and wanted to go back on the Xanax .  We also tried trazodone  for sleep but she felt too groggy the next day on the 100 mg up I suggested we go down to 50 mg.  She states that over the last 2 weeks she has been more depressed and staying in her room all the time.  This is after she had to go back to [facility] when her granddaughter was seen in the emergency room.  This brought back memories of seeing her father passed away in the same hospital.  The patient recently saw rheumatology and had more workup done but all of her labs were negative for rheumatological disease.  She does have osteoarthritis in many joints as well as myalgias and it sounds more like a fibromyalgia diagnosis.  Her energy is often poor.  She states that she had some gabapentin  leftover and it did help with the joint pain and also seem to make her less anxious.  I suggested she go back on this.  She denies any thoughts of suicide or self-harm Visit Diagnosis:    ICD-10-CM   1. Major depressive disorder, recurrent, severe without psychotic features (HCC)  F33.2     2. GAD (generalized anxiety disorder)  F41.1     3. Attention deficit hyperactivity disorder (ADHD), predominantly hyperactive type  F90.1       Past Psychiatric History: Long-term outpatient treatment for depression and anxiety  Past Medical History:  Past Medical History:  Diagnosis Date   Abnormal Papanicolaou smear of cervix with positive human papilloma virus (HPV) test 10/11/2018   LSIL +HPV, get colpo___   Abnormal uterine bleeding (AUB) 12/09/2014   Anxiety    Arthritis    Depression    DJD (degenerative joint disease)    Dumping syndrome    Dysmenorrhea 07/15/2014  Fatigue 12/24/2012   Fibromyalgia diagnosed April 2016   Headache    Hx of migraine headaches 12/24/2012   Inappropriate sinus tachycardia (HCC)    Irregular menstrual bleeding 07/15/2014   Pelvic pain in female 03/03/2016   Pneumothorax, spontaneous, tension    Post concussion syndrome    POTS (postural orthostatic tachycardia syndrome)    PTSD (post-traumatic stress disorder)    Scoliosis    Thyroid  disease    Tick bite 03/03/2016   Vitamin B 12 deficiency     Past Surgical History:  Procedure Laterality Date   CESAREAN SECTION     COLONOSCOPY     endooscopy     KNEE  SURGERY Left    ROTATOR CUFF REPAIR Left     Family Psychiatric History: See below  Family History:  Family History  Problem Relation Age of Onset   Hypertension Father    Atrial fibrillation Father    Alcohol abuse Father    Heart disease Father    Other Father        Intraductal Papillary Mucinous Neoplasm (IPMN)   High Cholesterol Father    Anxiety disorder Maternal Aunt    Anxiety disorder Maternal Uncle    Bipolar disorder Maternal Uncle    Breast cancer Maternal Uncle        CML   Cancer Maternal Grandmother        skin    Heart disease Maternal Grandmother    Other Maternal Grandmother        had thyroid  removed   Breast cancer Maternal Grandmother    Cancer Maternal Grandfather        bladder,lung   Heart disease Paternal Grandfather    COPD Paternal Grandfather    Hypertension Paternal Grandfather    Diabetes Paternal Grandfather    Stroke Paternal Actor    Healthy Daughter    Healthy Daughter    Healthy Son    Leukemia Other    Colon cancer Other        lung-2 maternal great uncles    Social History:  Social History   Socioeconomic History   Marital status: Married    Spouse name: Not on file   Number of children: 3   Years of education: Not on file   Highest education level: Not on file  Occupational History   Not on file  Tobacco Use   Smoking status: Former    Current packs/day: 0.00    Average packs/day: 1 pack/day for 24.5 years (24.5 ttl pk-yrs)    Types: Cigarettes    Start date: 03/13/1997    Quit date: 09/25/2021    Years since quitting: 2.4    Passive exposure: Past   Smokeless tobacco: Never   Tobacco comments:    "working on it" Was smoking 2.5 packs a day  Vaping Use   Vaping status: Former  Substance and Sexual Activity   Alcohol use: No   Drug use: No   Sexual activity: Yes    Partners: Male    Birth control/protection: None, Condom  Other Topics Concern   Not on file  Social History Narrative   Not on file    Social Drivers of Health   Financial Resource Strain: High Risk (07/03/2022)   Overall Financial Resource Strain (CARDIA)    Difficulty of Paying Living Expenses: Very hard  Food Insecurity: Food Insecurity Present (07/03/2022)   Hunger Vital Sign    Worried About Running Out of Food in the Last Year: Sometimes true  Ran Out of Food in the Last Year: Never true  Transportation Needs: No Transportation Needs (07/03/2022)   PRAPARE - Administrator, Civil Service (Medical): No    Lack of Transportation (Non-Medical): No  Physical Activity: Inactive (01/07/2021)   Exercise Vital Sign    Days of Exercise per Week: 0 days    Minutes of Exercise per Session: 0 min  Stress: Stress Concern Present (07/03/2022)   [employer] of Occupational Health - Occupational Stress Questionnaire    Feeling of Stress : Very much  Social Connections: Moderately Integrated (07/03/2022)   Social Connection and Isolation Panel [NHANES]    Frequency of Communication with Friends and Family: More than three times a week    Frequency of Social Gatherings with Friends and Family: Once a week    Attends Religious Services: 1 to 4 times per year    Active Member of [employer] or Organizations: No    Attends Banker Meetings: Never    Marital Status: Married    Allergies:  Allergies  Allergen Reactions   Prozac  [Fluoxetine ]     Suicidal thoughts   Topiramate Other (See Comments)    Topamax-dizziness   Lexapro  [Escitalopram  Oxalate] Other (See Comments)    Flat affect, No emotions    Metabolic Disorder Labs: Lab Results  Component Value Date   HGBA1C 5.5 10/01/2023   MPG 108 01/07/2013   Lab Results  Component Value Date   PROLACTIN 13.2 06/12/2016   Lab Results  Component Value Date   CHOL 204 (H) 01/07/2021   TRIG 132 01/07/2021   HDL 72 01/07/2021   CHOLHDL 2.8 01/07/2021   VLDL 31 01/07/2013   LDLCALC 109 (H) 01/07/2021   LDLCALC 80 10/08/2018   Lab Results   Component Value Date   TSH 0.65 10/01/2023   TSH 4.080 01/07/2021    Therapeutic Level Labs: Lab Results  Component Value Date   LITHIUM  0.45 (L) 01/27/2020   LITHIUM  0.24 (L) 09/03/2019   No results found for: "VALPROATE" No results found for: "CBMZ"  Current Medications: Current Outpatient Medications  Medication Sig Dispense Refill   traZODone  (DESYREL ) 50 MG tablet Take 1 tablet (50 mg total) by mouth at bedtime. 30 tablet 2   Acetaminophen  (TYLENOL  PO) Take by mouth as needed.     ALPRAZolam  (XANAX ) 1 MG tablet Take 1 tablet (1 mg total) by mouth 4 (four) times daily. 120 tablet 2   amphetamine -dextroamphetamine  (ADDERALL) 20 MG tablet Take 1 tablet (20 mg total) by mouth 3 (three) times daily. (Patient not taking: Reported on 02/22/2024) 90 tablet 0   amphetamine -dextroamphetamine  (ADDERALL) 20 MG tablet Take 1 tablet (20 mg total) by mouth 3 (three) times daily. 90 tablet 0   carbamazepine  (TEGRETOL ) 200 MG tablet Take 1 tablet (200 mg total) by mouth 2 (two) times daily. 60 tablet 2   citalopram  (CELEXA ) 20 MG tablet Take 1 tablet (20 mg total) by mouth 2 (two) times daily. 60 tablet 2   gabapentin  (NEURONTIN ) 300 MG capsule 1 PO q HS, may increase to 1 PO TID if tolerated 90 capsule 2   hyoscyamine  (LEVSIN) 0.125 MG tablet Take 1 tablet (0.125 mg total) by mouth every 6 (six) hours as needed for cramping. (Patient not taking: Reported on 02/22/2024) 90 tablet 3   ibuprofen  (ADVIL ,MOTRIN ) 200 MG tablet Take 400-600 mg by mouth every 6 (six) hours as needed for mild pain or moderate pain.     megestrol  (MEGACE ) 40 MG tablet  3 tablets a day for 5 days, 2 tablets a day for 5 days then 1 tablet daily 45 tablet 3   No current facility-administered medications for this visit.     Musculoskeletal: Strength & Muscle Tone: within normal limits Gait & Station: normal Patient leans: N/A  Psychiatric Specialty Exam: Review of Systems  Genitourinary:  Positive for vaginal bleeding.   Musculoskeletal:  Positive for joint swelling and myalgias.  Psychiatric/Behavioral:  Positive for dysphoric mood.   All other systems reviewed and are negative.   Last menstrual period 02/18/2024.There is no height or weight on file to calculate BMI.  General Appearance: Casual and Fairly Groomed  Eye Contact:  Good  Speech:  Clear and Coherent  Volume:  Normal  Mood:  Dysphoric  Affect:  Congruent  Thought Process:  Goal Directed  Orientation:  Full (Time, Place, and Person)  Thought Content: Rumination   Suicidal Thoughts:  No  Homicidal Thoughts:  No  Memory:  Immediate;   Good Recent;   Good Remote;   Good  Judgement:  Good  Insight:  Fair  Psychomotor Activity:  Decreased  Concentration:  Concentration: Good and Attention Span: Good  Recall:  Good  Fund of Knowledge: Good  Language: Good  Akathisia:  No  Handed:  Right  AIMS (if indicated): not done  Assets:  Communication Skills Desire for Improvement Resilience Social Support  ADL's:  Intact  Cognition: WNL  Sleep:  Fair   Screenings: GAD-7    Advertising copywriter from 12/07/2023 in [facility] [place] Office Visit from 07/03/2022 in [facility] Counselor from 05/31/2021 in [facility] at [place] Office Visit from 01/07/2021 in [facility] at [facility]  Total GAD-7 Score 21 21 20 16       Mini-Mental    Flowsheet Row Office Visit from 10/25/2016 in [facility]  Total Score (max 30 points ) 29      PHQ2-9    Flowsheet Row Counselor from 12/07/2023 in [facility] [place] Office Visit from 07/03/2022 in [facility] for [facility] at [facility] Video Visit from 06/29/2022 in [facility] at [place] Video Visit from 04/11/2022 in [facility] at  [place] Video Visit from 03/03/2022 in [facility] at [facility] Total Score 3 6 1 2 5   PHQ-9 Total Score 14 21 -- 6 13      Flowsheet Row ED from 09/21/2023 in [facility] at [facility] UC from 07/10/2023 in [facility] at [place] UC from 03/13/2023 in [facility] RISK CATEGORY No Risk No Risk No Risk        Assessment and Plan: This patient is a 42 year old female with a history of depression chronic fatigue, social anxiety ADD and borderline personality disorder.  She states that she had improvement in her mood anxiety with gabapentin  so we will start at 300 mg and work towards 300 mg 3 times a day.  We will cut the trazodone  back to 50 mg at bedtime due to oversedation.  She has gone back to Xanax  1 mg 4 times daily for anxiety.  She will continue Celexa  20 mg twice daily for depression carbamazepine  200 mg twice daily for mood stabilization Adderall 20 mg 3 times daily for ADD.  She will return to see me in 4 weeks  Collaboration of Care: Collaboration of Care: Referral or follow-up with counselor/therapist AEB patient will continue therapy with Secundino Dach in our office  Patient/Guardian was advised Release of Information must be obtained prior to any record release in order to collaborate their care with an outside provider. Patient/Guardian was advised if they have not already done so to contact the registration department to sign all necessary forms in order for us  to release information regarding their care.   Consent: Patient/Guardian gives verbal consent for treatment and assignment of benefits for services provided during this visit. Patient/Guardian expressed understanding and agreed to proceed.    Alfredia Annas, MD 02/28/2024, 11:21 AM

## 2024-03-07 ENCOUNTER — Telehealth (HOSPITAL_COMMUNITY): Payer: Self-pay | Admitting: *Deleted

## 2024-03-07 ENCOUNTER — Telehealth: Admitting: Physician Assistant

## 2024-03-07 ENCOUNTER — Ambulatory Visit
Admission: RE | Admit: 2024-03-07 | Discharge: 2024-03-07 | Disposition: A | Discharge status: Home / Self Care | Source: Ambulatory Visit | Attending: Nurse Practitioner | Admitting: Nurse Practitioner

## 2024-03-07 ENCOUNTER — Telehealth: Payer: Self-pay

## 2024-03-07 VITALS — BP 137/90 | HR 110 | Temp 98.9°F | Resp 16

## 2024-03-07 DIAGNOSIS — J029 Acute pharyngitis, unspecified: Secondary | ICD-10-CM

## 2024-03-07 DIAGNOSIS — J040 Acute laryngitis: Secondary | ICD-10-CM | POA: Diagnosis present

## 2024-03-07 LAB — POCT RAPID STREP A (OFFICE): Rapid Strep A Screen: NEGATIVE

## 2024-03-07 NOTE — ED Triage Notes (Signed)
 Pt presents to UC for c/o loss of voice, intermittent cough x3 days. Sore throat starting this morning. States "chest feels full." Has tried honey cough drops, hot water, zyrtec

## 2024-03-07 NOTE — Discharge Instructions (Addendum)
 The rapid strep test was negative.  A throat culture has been ordered.  You will be contacted if the pending test result is abnormal.  You will also have access to your results via MyChart. You may take over-the-counter Tylenol  or ibuprofen  as needed for pain, fever, or general discomfort. Warm salt water gargles 3-4 times daily as needed for throat pain or discomfort. Also recommend over-the-counter Chloraseptic throat spray or throat lozenges while symptoms persist  Rest your voice is much as possible to help decrease your recovery time. Recommend warm liquids such as tea with honey and lemon while symptoms persist. For your cough, recommend over-the-counter Delsym, Robitussin, or Mucinex as needed. Symptoms should improve over the next 5 to 7 days.  If symptoms fail to improve, or appear to be worsening, you may follow-up in this clinic or with your primary care physician for further evaluation. Follow-up as needed.

## 2024-03-07 NOTE — Patient Instructions (Signed)
 Destiny Orr

## 2024-03-07 NOTE — Telephone Encounter (Signed)
 Pt calls to state that "what is in the chart it is not what I said in the visit. That's false information." She states "I told the provider that I was having chest pain and shortness of breath." Pt states "I now have a fever and my chest tightness is worse." "I should have had a chest xray while I was there." Pt is advised that she should go to emergency department for her worsening symptoms.

## 2024-03-07 NOTE — ED Provider Notes (Signed)
 [facility]    CSN: [id number] Arrival date & time: 03/07/24  1151      History   Chief Complaint Chief Complaint  Patient presents with   Sore Throat    Voice completely gone, day 3 and worsened every day, throat pain isn't bad . started last night, no voice at all this morning. Feels swollen. - Entered by patient    HPI Destiny Orr is a 43 y.o. female.   The history is provided by the patient.   Patient with a 3-day history of hoarseness, cough, and sore throat.  Patient states sore throat started this morning.  She states "I feel like there is something in my throat in my chest."  Patient denies fever, chills, headache, ear pain, nasal congestion, runny nose, wheezing, difficulty breathing, chest pain, abdominal pain, nausea, vomiting, diarrhea, or rash.  States she has been using cough drops, honey, hot water, and cetirizine for symptoms. Past Medical History:  Diagnosis Date   Abnormal Papanicolaou smear of cervix with positive human papilloma virus (HPV) test 10/11/2018   LSIL +HPV, get colpo___   Abnormal uterine bleeding (AUB) 12/09/2014   Anxiety    Arthritis    Depression    DJD (degenerative joint disease)    Dumping syndrome    Dysmenorrhea 07/15/2014   Fatigue 12/24/2012   Fibromyalgia diagnosed April 2016   Headache    Hx of migraine headaches 12/24/2012   Inappropriate sinus tachycardia (HCC)    Irregular menstrual bleeding 07/15/2014   Pelvic pain in female 03/03/2016   Pneumothorax, spontaneous, tension    Post concussion syndrome    POTS (postural orthostatic tachycardia syndrome)    PTSD (post-traumatic stress disorder)    Scoliosis    Thyroid  disease    Tick bite 03/03/2016   Vitamin B 12 deficiency     Patient Active Problem List   Diagnosis Date Noted   Scoliosis of thoracolumbar spine 10/02/2023   Right hip pain 10/02/2023   Gastric motility disorder 10/02/2023   Palpitations 10/01/2023   Encounter for surveillance of contraceptive pills  01/07/2021   History of abnormal cervical Pap smear 01/07/2021   Screening cholesterol level 01/07/2021   Vitamin D  deficiency 01/20/2020   Hypothyroidism 11/15/2018   Abnormal Papanicolaou smear of cervix with positive human papilloma virus (HPV) test 10/11/2018   Goiter 10/08/2018   Breast tenderness 10/08/2018   Mass of upper outer quadrant of right breast 10/08/2018   Encounter for gynecological examination with Papanicolaou smear of cervix 10/08/2018   Current smoker 07/03/2017   Leukocytosis 06/18/2017   Thyroid  nodule 12/26/2016   Neck injury, initial encounter 10/20/2016   Concussion with loss of consciousness 10/20/2016   Injury of left shoulder 10/20/2016   Sinus tachycardia 10/16/2016   Post concussion syndrome 10/03/2016   Intractable headache 10/03/2016   Dizziness 10/03/2016   Blurry vision, right eye 10/03/2016   Nodular goiter 06/18/2016   Fibromyalgia syndrome 06/16/2016   Raynaud's syndrome without gangrene 06/16/2016   Numbness and tingling sensation of skin 06/16/2016   B12 deficiency 06/16/2016   Dizziness and giddiness 06/16/2016   Pelvic pain in female 03/03/2016   Tick bite 03/03/2016   POTS (postural orthostatic tachycardia syndrome) 02/05/2016   Abnormal uterine bleeding (AUB) 12/09/2014   Depression 11/26/2014   Dysmenorrhea 07/15/2014   Irregular menstrual bleeding 07/15/2014   Back pain, chronic 04/02/2013   Chronic fatigue and malaise 12/24/2012   Anxiety 12/24/2012   PTSD (post-traumatic stress disorder) 12/24/2012   ADD (attention deficit  disorder) 12/24/2012   Contraception 12/24/2012   Hx of migraine headaches 12/24/2012    Past Surgical History:  Procedure Laterality Date   CESAREAN SECTION     COLONOSCOPY     endooscopy     KNEE SURGERY Left    ROTATOR CUFF REPAIR Left     OB History     Gravida  3   Para  2   Term  2   Preterm      AB  1   Living  3      SAB      IAB      Ectopic      Multiple  1   Live  Births  3            Home Medications    Prior to Admission medications   Medication Sig Start Date End Date Taking? Authorizing Provider  Acetaminophen  (TYLENOL  PO) Take by mouth as needed.    [provider]  ALPRAZolam  (XANAX ) 1 MG tablet Take 1 tablet (1 mg total) by mouth 4 (four) times daily. 02/26/24 02/25/25  Alysia Bachelor, MD  amphetamine -dextroamphetamine  (ADDERALL) 20 MG tablet Take 1 tablet (20 mg total) by mouth 3 (three) times daily. Patient not taking: Reported on 02/22/2024 11/22/23 11/21/24  Alysia Bachelor, MD  amphetamine -dextroamphetamine  (ADDERALL) 20 MG tablet Take 1 tablet (20 mg total) by mouth 3 (three) times daily. 02/28/24   Alysia Bachelor, MD  carbamazepine  (TEGRETOL ) 200 MG tablet Take 1 tablet (200 mg total) by mouth 2 (two) times daily. 02/28/24   Alysia Bachelor, MD  citalopram  (CELEXA ) 20 MG tablet Take 1 tablet (20 mg total) by mouth 2 (two) times daily. 02/28/24 02/27/25  Alysia Bachelor, MD  gabapentin  (NEURONTIN ) 300 MG capsule 1 PO q HS, may increase to 1 PO TID if tolerated 02/28/24   Alysia Bachelor, MD  hyoscyamine  (LEVSIN) 0.125 MG tablet Take 1 tablet (0.125 mg total) by mouth every 6 (six) hours as needed for cramping. Patient not taking: Reported on 02/22/2024 10/05/23   Crain, Whitney L, PA  ibuprofen  (ADVIL ,MOTRIN ) 200 MG tablet Take 400-600 mg by mouth every 6 (six) hours as needed for mild pain or moderate pain.    [provider]  megestrol  (MEGACE ) 40 MG tablet 3 tablets a day for 5 days, 2 tablets a day for 5 days then 1 tablet daily 02/22/24   Wendelyn Halter, MD  traZODone  (DESYREL ) 50 MG tablet Take 1 tablet (50 mg total) by mouth at bedtime. 02/28/24   Alysia Bachelor, MD    Family History Family History  Problem Relation Age of Onset   Hypertension Father    Atrial fibrillation Father    Alcohol abuse Father    Heart disease Father    Other Father        Intraductal Papillary Mucinous Neoplasm (IPMN)   High Cholesterol  Father    Anxiety disorder Maternal Aunt    Anxiety disorder Maternal Uncle    Bipolar disorder Maternal Uncle    Breast cancer Maternal Uncle        CML   Cancer Maternal Grandmother        skin    Heart disease Maternal Grandmother    Other Maternal Grandmother        had thyroid  removed   Breast cancer Maternal Grandmother    Cancer Maternal Grandfather        bladder,lung   Heart disease Paternal Grandfather  COPD Paternal Grandfather    Hypertension Paternal Grandfather    Diabetes Paternal Grandfather    Stroke Paternal Grandfather    Healthy Daughter    Healthy Daughter    Healthy Son    Leukemia Other    Colon cancer Other        lung-2 maternal great uncles    Social History Social History   Tobacco Use   Smoking status: Former    Current packs/day: 0.00    Average packs/day: 1 pack/day for 24.5 years (24.5 ttl pk-yrs)    Types: Cigarettes    Start date: 03/13/1997    Quit date: 09/25/2021    Years since quitting: 2.4    Passive exposure: Past   Smokeless tobacco: Never   Tobacco comments:    "working on it" Was smoking 2.5 packs a day  Vaping Use   Vaping status: Former  Substance Use Topics   Alcohol use: No   Drug use: No     Allergies   Prozac  [fluoxetine ], Topiramate, and Lexapro  [escitalopram  oxalate]   Review of Systems Review of Systems Per HPI  Physical Exam Triage Vital Signs ED Triage Vitals  Encounter Vitals Group     BP 03/07/24 1214 (!) 137/90     Systolic BP Percentile --      Diastolic BP Percentile --      Pulse Rate 03/07/24 1214 (!) 110     Resp 03/07/24 1214 16     Temp 03/07/24 1214 98.9 F (37.2 C)     Temp Source 03/07/24 1214 Oral     SpO2 03/07/24 1214 99 %     Weight --      Height --      Head Circumference --      Peak Flow --      Pain Score 03/07/24 1208 0     Pain Loc --      Pain Education --      Exclude from Growth Chart --    No data found.  Updated Vital Signs BP (!) 137/90 (BP Location:  Right Arm)   Pulse (!) 110   Temp 98.9 F (37.2 C) (Oral)   Resp 16   LMP 02/18/2024   SpO2 99%   Visual Acuity Right Eye Distance:   Left Eye Distance:   Bilateral Distance:    Right Eye Near:   Left Eye Near:    Bilateral Near:     Physical Exam Vitals and nursing note reviewed.  Constitutional:      General: She is not in acute distress.    Appearance: She is well-developed.  HENT:     Head: Normocephalic.     Right Ear: Tympanic membrane, ear canal and external ear normal.     Left Ear: Tympanic membrane, ear canal and external ear normal.     Nose: Nose normal.     Right Turbinates: Enlarged and swollen.     Left Turbinates: Enlarged and swollen.     Right Sinus: No maxillary sinus tenderness or frontal sinus tenderness.     Left Sinus: No maxillary sinus tenderness or frontal sinus tenderness.     Mouth/Throat:     Lips: Pink.     Mouth: Mucous membranes are moist.     Pharynx: Posterior oropharyngeal erythema and postnasal drip present. No pharyngeal swelling, oropharyngeal exudate or uvula swelling.     Comments: Cobblestoning present to posterior oropharynx  Eyes:     Extraocular Movements: Extraocular movements intact.  Conjunctiva/sclera: Conjunctivae normal.     Pupils: Pupils are equal, round, and reactive to light.  Cardiovascular:     Rate and Rhythm: Normal rate and regular rhythm.     Pulses: Normal pulses.     Heart sounds: Normal heart sounds.  Pulmonary:     Effort: Pulmonary effort is normal.     Breath sounds: Normal breath sounds.  Abdominal:     General: Bowel sounds are normal.     Palpations: Abdomen is soft.     Tenderness: There is no abdominal tenderness.  Musculoskeletal:     Cervical back: Normal range of motion.  Lymphadenopathy:     Cervical: No cervical adenopathy.  Skin:    General: Skin is warm and dry.  Neurological:     General: No focal deficit present.     Mental Status: She is alert and oriented to person, place,  and time.  Psychiatric:        Mood and Affect: Mood normal.        Behavior: Behavior normal.      UC Treatments / Results  Labs (all labs ordered are listed, but only abnormal results are displayed) Labs Reviewed  CULTURE, GROUP A STREP [employer])  POCT RAPID STREP A (OFFICE)    EKG   Radiology No results found.  Procedures Procedures (including critical care time)  Medications Ordered in UC Medications - No data to display  Initial Impression / Assessment and Plan / UC Course  I have reviewed the triage vital signs and the nursing notes.  Pertinent labs & imaging results that were available during my care of the patient were reviewed by me and considered in my medical decision making (see chart for details).  The rapid strep test was negative.  A throat culture is pending.  The patient is well-appearing, she is in no acute distress, vital signs are stable.  On exam, lung sounds are clear throughout, room air sats at 99%.  Symptoms consistent with underlying viral etiology for laryngitis and sore throat.  Supportive care recommendations were provided and discussed with the patient to include warm salt water gargles, over-the-counter analgesics, use of Chloraseptic throat spray, and voice rest.  Discussed indications with patient regarding follow-up.  Patient was in agreement with this plan of care and verbalizes understanding.  All questions were answered.  Patient stable for discharge.  Final Clinical Impressions(s) / UC Diagnoses   Final diagnoses:  Laryngitis  Sore throat     Discharge Instructions      The rapid strep test was negative.  A throat culture has been ordered.  You will be contacted if the pending test result is abnormal.  You will also have access to your results via MyChart. You may take over-the-counter Tylenol  or ibuprofen  as needed for pain, fever, or general discomfort. Warm salt water gargles 3-4 times daily as needed for throat pain or discomfort.  Also recommend over-the-counter Chloraseptic throat spray or throat lozenges while symptoms persist  Rest your voice is much as possible to help decrease your recovery time. Recommend warm liquids such as tea with honey and lemon while symptoms persist. For your cough, recommend over-the-counter Delsym, Robitussin, or Mucinex as needed. Symptoms should improve over the next 5 to 7 days.  If symptoms fail to improve, or appear to be worsening, you may follow-up in this clinic or with your primary care physician for further evaluation. Follow-up as needed.   ED Prescriptions   None    PDMP not  reviewed this encounter.   Hardy Lia, NP 03/07/24 1233

## 2024-03-07 NOTE — Progress Notes (Signed)
 The patient no-showed for appointment despite this provider sending direct link, reaching out via phone with no response and waiting for at least 10 minutes from appointment time for patient to join. They will be marked as a NS for this appointment/time.   Laure Kidney, PA-C

## 2024-03-07 NOTE — Telephone Encounter (Signed)
 Pt called wanting to clarify charting on most recent encounter. Manager notified and will return call.

## 2024-03-10 ENCOUNTER — Telehealth: Payer: Self-pay

## 2024-03-10 ENCOUNTER — Ambulatory Visit (HOSPITAL_COMMUNITY): Payer: Self-pay

## 2024-03-10 LAB — CULTURE, GROUP A STREP (THRC)

## 2024-03-17 ENCOUNTER — Other Ambulatory Visit (HOSPITAL_COMMUNITY): Payer: Self-pay | Admitting: Psychiatry

## 2024-03-19 ENCOUNTER — Ambulatory Visit: Payer: Medicare Other | Admitting: Rheumatology

## 2024-04-01 ENCOUNTER — Telehealth (HOSPITAL_COMMUNITY): Admitting: Psychiatry

## 2024-04-01 ENCOUNTER — Encounter (HOSPITAL_COMMUNITY): Payer: Self-pay

## 2024-04-01 ENCOUNTER — Encounter (HOSPITAL_COMMUNITY): Payer: Self-pay | Admitting: Psychiatry

## 2024-04-01 DIAGNOSIS — F901 Attention-deficit hyperactivity disorder, predominantly hyperactive type: Secondary | ICD-10-CM | POA: Diagnosis not present

## 2024-04-01 DIAGNOSIS — F332 Major depressive disorder, recurrent severe without psychotic features: Secondary | ICD-10-CM

## 2024-04-01 DIAGNOSIS — F411 Generalized anxiety disorder: Secondary | ICD-10-CM

## 2024-04-01 MED ORDER — TRAZODONE HCL 50 MG PO TABS: 50.0000 mg | ORAL_TABLET | Freq: Every day | ORAL | 2 refills | Status: DC

## 2024-04-01 MED ORDER — CARBAMAZEPINE 200 MG PO TABS: 200.0000 mg | ORAL_TABLET | Freq: Two times a day (BID) | ORAL | 2 refills | Status: DC

## 2024-04-01 MED ORDER — CITALOPRAM HYDROBROMIDE 20 MG PO TABS: 20.0000 mg | ORAL_TABLET | Freq: Two times a day (BID) | ORAL | 2 refills | Status: DC

## 2024-04-01 MED ORDER — GABAPENTIN 300 MG PO CAPS: ORAL_CAPSULE | ORAL | 2 refills | Status: DC

## 2024-04-01 MED ORDER — DIAZEPAM 10 MG PO TABS: 20.0000 mg | ORAL_TABLET | Freq: Three times a day (TID) | ORAL | 0 refills | Status: DC
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
## 2024-04-14 ENCOUNTER — Ambulatory Visit (INDEPENDENT_AMBULATORY_CARE_PROVIDER_SITE_OTHER): Admitting: Clinical

## 2024-04-14 DIAGNOSIS — F332 Major depressive disorder, recurrent severe without psychotic features: Secondary | ICD-10-CM | POA: Diagnosis not present

## 2024-04-14 DIAGNOSIS — F901 Attention-deficit hyperactivity disorder, predominantly hyperactive type: Secondary | ICD-10-CM | POA: Diagnosis not present

## 2024-04-14 DIAGNOSIS — F411 Generalized anxiety disorder: Secondary | ICD-10-CM

## 2024-04-14 NOTE — Progress Notes (Addendum)
 Virtual Visit via Telephone Note   I connected with Destiny Orr on 04/14/24 at 11:00 AM EST by telephone and verified that I am speaking with the correct person using two identifiers.   Location: Patient: Home Provider: Office   I discussed the limitations, risks, security and privacy concerns of performing an evaluation and management service by telephone and the availability of in person appointments. I also discussed with the patient that there may be a patient responsible charge related to this service. The patient expressed understanding and agreed to proceed.     THERAPIST PROGRESS NOTE   Session Time: 11:00 AM-11:35 AM   Participation Level: Active   Behavioral Response: CasualAlertDepressed   Type of Therapy: Individual Therapy   Treatment Goals addressed: Coping for MH diagnosis   Interventions: CBT, Motivational Interviewing, Strength-based and Supportive   Summary: Destiny Orr is a 43 y.o. female who presents with Depression, ADHD, GAD.The OPT therapist worked with the patient for her ongoing outpatient. OPT treatment. The OPT therapist utilized Motivational Interviewing to assist in creating therapeutic repore. The patient in the session was engaged and work in collaboration giving feedback about her triggers and symptoms over the past few weeks and spoke about change with her daughter and granddaughter recently moving out . The patient spoke about physical health concerns around what she believes is cardiac related problem and was seen recently at the ER for and released with instructions to follow up with a PCP. The OPT therapist utilized Cognitive Behavioral Therapy through cognitive restructuring as well as worked with the patient on self awareness and implementing coping strategies to assist in management of current stressors.The OPT therapist provided ongoing psychoeducation and support throughout the session with the patient. The patient spoke about the relationship between she  and her Mother and recently deciding to block interaction with her Mother who she identified as un-empathetic and not supportive.The patient spoke about her ongoing work with Dr. Okey and her med management. The patient spoke about having worsening chest pains she attributes to A-fib like symptoms. The patient was recently seen at ER for chest pain with instruction to follow up with a PCP, but missed her follow up appointment. The patient noted if her physical health symptoms got worse she would return to ER.   Suicidal/Homicidal: Nowithout intent/plan   Therapist Response: The OPT therapist worked with the patient for the patients scheduled session. The patient was engaged in her session and gave feedback in relation to triggers, symptoms, and behavior responses over the past few weeks including the impact of her daughter and granddaughter moving out recently. The patient spoke about her ongoing difficulty with functioning and adjusting in her daily tasks over the past few weeks due to physical health symptoms she associated to A-fib like symptoms for which she was recently see at the local ER. The patient spoke about feeling unsupported by her Mother and the stress of their relationship leading to the patient deciding to block her. The OPT therapist worked with the patient utilizing an in session Cognitive Behavioral Therapy exercise. The patient was responsive in the session and verbalized,  I feel like my heart problem is getting worse and I can't wait until Aug/Sept to be seen by PCP.  The OPT therapist worked with the patient promoting if she feels she is having or at risk for physical health problem crisis to return to the ER for further evaluation and the patient agreed.The OPT therapist worked with the patient to implement coping to improve  her functioning as she continues to grieve the loss of her Father. The patient worked with the OPT therapist in implementing available coping strategies to help  her with improving functioning as well as strategy to manage external stressors.The OPT therapist will continue treatment work with the patient in her next scheduled session   Plan: Return again in 2/3 weeks.   Diagnosis:      Axis I: Major depressive disorder, recurrent, severe without psychotic features and GAD                           Axis II: No diagnosis       Collaboration of Care: Collaboration of Care (psychiatrist) Dr. Okey.    Patient/Guardian was advised Release of Information must be obtained prior to any record release in order to collaborate their care with an outside provider. Patient/Guardian was advised if they have not already done so to contact the registration department to sign all necessary forms in order for us  to release information regarding their care.    Consent: Patient/Guardian gives verbal consent for treatment and assignment of benefits for services provided during this visit. Patient/Guardian expressed understanding and agreed to proceed.    I discussed the assessment and treatment plan with the patient. The patient was provided an opportunity to ask questions and all were answered. The patient agreed with the plan and demonstrated an understanding of the instructions.   The patient was advised to call back or seek an in-person evaluation if the symptoms worsen or if the condition fails to improve as anticipated.   I provided 55 minutes of non-face-to-face time during this encounter.     [name], LCSW   04/14/2024

## 2024-04-17 ENCOUNTER — Telehealth (HOSPITAL_COMMUNITY): Payer: Self-pay

## 2024-04-17 ENCOUNTER — Other Ambulatory Visit (HOSPITAL_COMMUNITY): Payer: Self-pay | Admitting: Psychiatry

## 2024-04-17 MED ORDER — DIAZEPAM 10 MG PO TABS: 20.0000 mg | ORAL_TABLET | Freq: Three times a day (TID) | ORAL | 2 refills | Status: DC

## 2024-04-17 NOTE — Telephone Encounter (Signed)
 resent

## 2024-04-17 NOTE — Telephone Encounter (Signed)
 Pt called in stating her  diazepam  (VALIUM ) 10 MG tablet was only sent in for 20 days. She states that she will be out of meds on Sunday night and she is leaving for the beach tomorrow. Please advise.

## 2024-04-17 NOTE — Telephone Encounter (Signed)
Pt aware.

## 2024-04-25 ENCOUNTER — Other Ambulatory Visit (HOSPITAL_COMMUNITY): Payer: Self-pay | Admitting: Psychiatry

## 2024-04-26 ENCOUNTER — Other Ambulatory Visit (HOSPITAL_COMMUNITY): Payer: Self-pay | Admitting: Psychiatry

## 2024-04-29 ENCOUNTER — Telehealth (HOSPITAL_COMMUNITY): Admitting: Psychiatry

## 2024-04-29 ENCOUNTER — Encounter (HOSPITAL_COMMUNITY): Payer: Self-pay | Admitting: Psychiatry

## 2024-04-29 DIAGNOSIS — F901 Attention-deficit hyperactivity disorder, predominantly hyperactive type: Secondary | ICD-10-CM | POA: Diagnosis not present

## 2024-04-29 DIAGNOSIS — F411 Generalized anxiety disorder: Secondary | ICD-10-CM | POA: Diagnosis not present

## 2024-04-29 DIAGNOSIS — F332 Major depressive disorder, recurrent severe without psychotic features: Secondary | ICD-10-CM

## 2024-04-29 MED ORDER — TRAZODONE HCL 50 MG PO TABS: 50.0000 mg | ORAL_TABLET | Freq: Every day | ORAL | 2 refills | Status: DC

## 2024-04-29 MED ORDER — DIAZEPAM 10 MG PO TABS: 20.0000 mg | ORAL_TABLET | Freq: Three times a day (TID) | ORAL | 2 refills | Status: DC

## 2024-04-29 MED ORDER — GABAPENTIN 300 MG PO CAPS: ORAL_CAPSULE | ORAL | 2 refills | Status: DC

## 2024-04-29 MED ORDER — CARBAMAZEPINE 200 MG PO TABS: 200.0000 mg | ORAL_TABLET | Freq: Two times a day (BID) | ORAL | 2 refills | Status: DC

## 2024-04-29 MED ORDER — CITALOPRAM HYDROBROMIDE 20 MG PO TABS: 20.0000 mg | ORAL_TABLET | Freq: Two times a day (BID) | ORAL | 2 refills | Status: DC

## 2024-04-29 NOTE — Progress Notes (Signed)
 Virtual Visit via Video Note  I connected with Destiny Orr on 04/29/24 at  1:20 PM EDT by a video enabled telemedicine application and verified that I am speaking with the correct person using two identifiers.  Location: Patient: home Provider: office   I discussed the limitations of evaluation and management by telemedicine and the availability of in person appointments. The patient expressed understanding and agreed to proceed.      I discussed the assessment and treatment plan with the patient. The patient was provided an opportunity to ask questions and all were answered. The patient agreed with the plan and demonstrated an understanding of the instructions.   The patient was advised to call back or seek an in-person evaluation if the symptoms worsen or if the condition fails to improve as anticipated.  I provided 20 minutes of non-face-to-face time during this encounter.   Barnie Gull, MD  [facility] MD/PA/NP OP Progress Note  04/29/2024 1:46 PM Destiny Orr  MRN:  [id number]  Chief Complaint:  Chief Complaint  Patient presents with   Anxiety   Depression   Follow-up   HPI: : This patient is a 43 year old separated white female who lives with her ex-husband and 1 son and 2 daughters and a granddaughter in [place].  She is on disability.   The patient returns for follow-up after 4 weeks regarding her depression and anxiety and ADD.  She states that she had to go to the emergency room on 3 July because she was having chest pain.  She has been having a lot of fatigue over the last several months.  Nothing substantial was found on either a EKG x-ray or lab studies and they did not think she was having a myocardial infarction.  She now has an appoint with cardiology this week.  She states that she has frequent palpitations or skipped beats.  Sometimes her blood pressure is high.  Her heart rate she is claims today is low.  She is convinced that something is seriously wrong.  In the  interim her mood has been fairly stable.  She thinks the Valium  has helped her anxiety.  She is not taking the Adderall and I told her to stay off stimulants until she sees cardiology.  She denies significant depression thoughts of self-harm or suicide.  She is sleeping fairly well. Visit Diagnosis:    ICD-10-CM   1. Major depressive disorder, recurrent, severe without psychotic features (HCC)  F33.2     2. GAD (generalized anxiety disorder)  F41.1     3. Attention deficit hyperactivity disorder (ADHD), predominantly hyperactive type  F90.1       Past Psychiatric History: Long-term outpatient treatment for depression and anxiety  Past Medical History:  Past Medical History:  Diagnosis Date   Abnormal Papanicolaou smear of cervix with positive human papilloma virus (HPV) test 10/11/2018   LSIL +HPV, get colpo___   Abnormal uterine bleeding (AUB) 12/09/2014   Anxiety    Arthritis    Depression    DJD (degenerative joint disease)    Dumping syndrome    Dysmenorrhea 07/15/2014   Fatigue 12/24/2012   Fibromyalgia diagnosed April 2016   Headache    Hx of migraine headaches 12/24/2012   Inappropriate sinus tachycardia (HCC)    Irregular menstrual bleeding 07/15/2014   Pelvic pain in female 03/03/2016   Pneumothorax, spontaneous, tension    Post concussion syndrome    POTS (postural orthostatic tachycardia syndrome)    PTSD (post-traumatic stress disorder)    Scoliosis  Thyroid  disease    Tick bite 03/03/2016   Vitamin B 12 deficiency     Past Surgical History:  Procedure Laterality Date   CESAREAN SECTION     COLONOSCOPY     endooscopy     KNEE SURGERY Left    ROTATOR CUFF REPAIR Left     Family Psychiatric History: See below  Family History:  Family History  Problem Relation Age of Onset   Hypertension Father    Atrial fibrillation Father    Alcohol abuse Father    Heart disease Father    Other Father        Intraductal Papillary Mucinous Neoplasm (IPMN)   High  Cholesterol Father    Anxiety disorder Maternal Aunt    Anxiety disorder Maternal Uncle    Bipolar disorder Maternal Uncle    Breast cancer Maternal Uncle        CML   Cancer Maternal Grandmother        skin    Heart disease Maternal Grandmother    Other Maternal Grandmother        had thyroid  removed   Breast cancer Maternal Grandmother    Cancer Maternal Grandfather        bladder,lung   Heart disease Paternal Grandfather    COPD Paternal Grandfather    Hypertension Paternal Grandfather    Diabetes Paternal Grandfather    Stroke Paternal Actor    Healthy Daughter    Healthy Daughter    Healthy Son    Leukemia Other    Colon cancer Other        lung-2 maternal great uncles    Social History:  Social History   Socioeconomic History   Marital status: Married    Spouse name: Not on file   Number of children: 3   Years of education: Not on file   Highest education level: Not on file  Occupational History   Not on file  Tobacco Use   Smoking status: Former    Current packs/day: 0.00    Average packs/day: 1 pack/day for 24.5 years (24.5 ttl pk-yrs)    Types: Cigarettes    Start date: 03/13/1997    Quit date: 09/25/2021    Years since quitting: 2.5    Passive exposure: Past   Smokeless tobacco: Never   Tobacco comments:    working on it Was smoking 2.5 packs a day  Vaping Use   Vaping status: Former  Substance and Sexual Activity   Alcohol use: No   Drug use: No   Sexual activity: Yes    Partners: Male    Birth control/protection: None, Condom  Other Topics Concern   Not on file  Social History Narrative   Not on file   Social Drivers of Health   Financial Resource Strain: High Risk (07/03/2022)   Overall Financial Resource Strain (CARDIA)    Difficulty of Paying Living Expenses: Very hard  Food Insecurity: Medium Risk (04/06/2024)   Received from [facility]   Hunger Vital Sign    Within the past 12 months, you worried that your food would run  out before you got money to buy more: Sometimes true    Within the past 12 months, the food you bought just didn't last and you didn't have money to get more. : Sometimes true  Transportation Needs: No Transportation Needs (04/06/2024)   Received from [facility]    In the past 12 months, has lack of reliable transportation kept you  from medical appointments, meetings, work or from getting things needed for daily living? : No  Physical Activity: Inactive (01/07/2021)   Exercise Vital Sign    Days of Exercise per Week: 0 days    Minutes of Exercise per Session: 0 min  Stress: Stress Concern Present (07/03/2022)   [employer] of Occupational Health - Occupational Stress Questionnaire    Feeling of Stress : Very much  Social Connections: Moderately Integrated (07/03/2022)   Social Connection and Isolation Panel    Frequency of Communication with Friends and Family: More than three times a week    Frequency of Social Gatherings with Friends and Family: Once a week    Attends Religious Services: 1 to 4 times per year    Active Member of [employer] or Organizations: No    Attends Banker Meetings: Never    Marital Status: Married    Allergies:  Allergies  Allergen Reactions   Prozac  [Fluoxetine ]     Suicidal thoughts   Topiramate Other (See Comments)    Topamax-dizziness   Lexapro  [Escitalopram  Oxalate] Other (See Comments)    Flat affect, No emotions    Metabolic Disorder Labs: Lab Results  Component Value Date   HGBA1C 5.5 10/01/2023   MPG 108 01/07/2013   Lab Results  Component Value Date   PROLACTIN 13.2 06/12/2016   Lab Results  Component Value Date   CHOL 204 (H) 01/07/2021   TRIG 132 01/07/2021   HDL 72 01/07/2021   CHOLHDL 2.8 01/07/2021   VLDL 31 01/07/2013   LDLCALC 109 (H) 01/07/2021   LDLCALC 80 10/08/2018   Lab Results  Component Value Date   TSH 0.65 10/01/2023   TSH 4.080 01/07/2021    Therapeutic Level Labs: Lab Results   Component Value Date   LITHIUM  0.45 (L) 01/27/2020   LITHIUM  0.24 (L) 09/03/2019   No results found for: VALPROATE No results found for: CBMZ  Current Medications: Current Outpatient Medications  Medication Sig Dispense Refill   Acetaminophen  (TYLENOL  PO) Take by mouth as needed.     carbamazepine  (TEGRETOL ) 200 MG tablet Take 1 tablet (200 mg total) by mouth 2 (two) times daily. 60 tablet 2   citalopram  (CELEXA ) 20 MG tablet Take 1 tablet (20 mg total) by mouth 2 (two) times daily. 60 tablet 2   diazepam  (VALIUM ) 10 MG tablet Take 2 tablets (20 mg total) by mouth 3 (three) times daily. 180 tablet 2   gabapentin  (NEURONTIN ) 300 MG capsule 1 PO q HS, may increase to 1 PO TID if tolerated 90 capsule 2   hyoscyamine  (LEVSIN ) 0.125 MG tablet Take 1 tablet (0.125 mg total) by mouth every 6 (six) hours as needed for cramping. (Patient not taking: Reported on 02/22/2024) 90 tablet 3   ibuprofen  (ADVIL ,MOTRIN ) 200 MG tablet Take 400-600 mg by mouth every 6 (six) hours as needed for mild pain or moderate pain.     traZODone  (DESYREL ) 50 MG tablet Take 1 tablet (50 mg total) by mouth at bedtime. 30 tablet 2   No current facility-administered medications for this visit.     Musculoskeletal: Strength & Muscle Tone: within normal limits Gait & Station: normal Patient leans: N/A  Psychiatric Specialty Exam: Review of Systems  Constitutional:  Positive for fatigue.  Cardiovascular:  Positive for palpitations and leg swelling.  All other systems reviewed and are negative.   There were no vitals taken for this visit.There is no height or weight on file to calculate BMI.  General Appearance:  Casual and Fairly Groomed  Eye Contact:  Good  Speech:  Clear and Coherent  Volume:  Normal  Mood:  Anxious and Euthymic  Affect:  Congruent  Thought Process:  Goal Directed  Orientation:  Full (Time, Place, and Person)  Thought Content: Rumination   Suicidal Thoughts:  No  Homicidal Thoughts:  No   Memory:  Immediate;   Good Recent;   Good Remote;   NA  Judgement:  Good  Insight:  Fair  Psychomotor Activity:  Decreased  Concentration:  Concentration: Good and Attention Span: Good  Recall:  Good  Fund of Knowledge: Good  Language: Good  Akathisia:  No  Handed:  Right  AIMS (if indicated): not done  Assets:  Communication Skills Desire for Improvement Resilience Social Support  ADL's:  Intact  Cognition: WNL  Sleep:  Good   Screenings: GAD-7    Advertising copywriter from 12/07/2023 in [facility] at [place] Office Visit from 07/03/2022 in [facility] Counselor from 05/31/2021 in [facility] at [place] Office Visit from 01/07/2021 in [facility] at [facility]  Total GAD-7 Score 21 21 20 16    Mini-Mental    Flowsheet Row Office Visit from 10/25/2016 in [facility]  Total Score (max 30 points ) 29   PHQ2-9    Flowsheet Row Counselor from 12/07/2023 in [facility] at [place] Office Visit from 07/03/2022 in [facility] at [facility] Video Visit from 06/29/2022 in [facility] at [place] Video Visit from 04/11/2022 in [facility] at [place] Video Visit from 03/03/2022 in [facility] at [facility] Total Score 3 6 1 2 5   PHQ-9 Total Score 14 21 -- 6 13   Flowsheet Row UC from 03/07/2024 in [facility] at [facility] ED from 09/21/2023 in [facility] Emergency Department at [facility] UC from 07/10/2023 in [facility] at [facility] RISK CATEGORY No Risk No Risk No Risk     Assessment and Plan: This patient is a 43 year old female with a history of depression anxiety chronic fatigue ADD and borderline personality disorder.  She seems to be  doing better in terms of her mood at anxiety.  She will continue Valium  20 mg 3 times daily for anxiety, Celexa  20 mg daily for depression, carbamazepine  200 mg twice daily for mood stabilization trazodone  50 mg at bedtime for sleep and gabapentin  300 mg 3 times daily for anxiety and nerve pain.  We will hold off on ADHD medications for now.  She will return to see me in 6 weeks  Collaboration of Care: Collaboration of Care: Referral or follow-up with counselor/therapist AEB patient will continue therapy with Jerel Pepper in our office  Patient/Guardian was advised Release of Information must be obtained prior to any record release in order to collaborate their care with an outside provider. Patient/Guardian was advised if they have not already done so to contact the registration department to sign all necessary forms in order for us  to release information regarding their care.   Consent: Patient/Guardian gives verbal consent for treatment and assignment of benefits for services provided during this visit. Patient/Guardian expressed understanding and agreed to proceed.    Barnie Gull, MD 04/29/2024, 1:46 PM

## 2024-05-12 ENCOUNTER — Ambulatory Visit (HOSPITAL_COMMUNITY): Admitting: Clinical

## 2024-05-21 ENCOUNTER — Telehealth (HOSPITAL_COMMUNITY): Payer: Self-pay | Admitting: *Deleted

## 2024-05-21 NOTE — Telephone Encounter (Signed)
We can discuss at next appt

## 2024-05-21 NOTE — Telephone Encounter (Signed)
 Per pt she is not doing well since she stopped the Adderall. Per pt she and provider talked about starting something new and would like to start those other options. Per pt, concentration, having racing thoughts and and can not make self start anything and she's getting distracted and starting different task and never finish them. Per pt, she jut want provider know that saw the cardiologist already.

## 2024-05-23 ENCOUNTER — Encounter: Payer: Self-pay | Admitting: Radiology

## 2024-05-23 NOTE — Telephone Encounter (Signed)
 Patient informed.

## 2024-06-05 ENCOUNTER — Encounter (HOSPITAL_COMMUNITY): Payer: Self-pay | Admitting: Psychiatry

## 2024-06-05 ENCOUNTER — Telehealth (INDEPENDENT_AMBULATORY_CARE_PROVIDER_SITE_OTHER): Admitting: Psychiatry

## 2024-06-05 DIAGNOSIS — F901 Attention-deficit hyperactivity disorder, predominantly hyperactive type: Secondary | ICD-10-CM

## 2024-06-05 DIAGNOSIS — F332 Major depressive disorder, recurrent severe without psychotic features: Secondary | ICD-10-CM

## 2024-06-05 DIAGNOSIS — F411 Generalized anxiety disorder: Secondary | ICD-10-CM

## 2024-06-05 MED ORDER — CARBAMAZEPINE 200 MG PO TABS: 200.0000 mg | ORAL_TABLET | Freq: Two times a day (BID) | ORAL | 2 refills | Status: DC

## 2024-06-05 MED ORDER — TRAZODONE HCL 50 MG PO TABS: 50.0000 mg | ORAL_TABLET | Freq: Every day | ORAL | 2 refills | Status: DC

## 2024-06-05 MED ORDER — ALPRAZOLAM 1 MG PO TABS: 1.0000 mg | ORAL_TABLET | Freq: Four times a day (QID) | ORAL | 2 refills | Status: DC

## 2024-06-05 MED ORDER — CITALOPRAM HYDROBROMIDE 20 MG PO TABS: 20.0000 mg | ORAL_TABLET | Freq: Two times a day (BID) | ORAL | 2 refills | Status: DC

## 2024-06-05 MED ORDER — GABAPENTIN 300 MG PO CAPS: ORAL_CAPSULE | ORAL | 2 refills | Status: DC

## 2024-06-05 MED ORDER — ATOMOXETINE HCL 25 MG PO CAPS: 25.0000 mg | ORAL_CAPSULE | ORAL | 2 refills | Status: DC

## 2024-06-05 NOTE — Progress Notes (Signed)
 Virtual Visit via Video Note  I connected with Destiny Orr on 06/05/24 at  1:20 PM EDT by a video enabled telemedicine application and verified that I am speaking with the correct person using two identifiers.  Location: Patient: home Provider: office   I discussed the limitations of evaluation and management by telemedicine and the availability of in person appointments. The patient expressed understanding and agreed to proceed.      I discussed the assessment and treatment plan with the patient. The patient was provided an opportunity to ask questions and all were answered. The patient agreed with the plan and demonstrated an understanding of the instructions.   The patient was advised to call back or seek an in-person evaluation if the symptoms worsen or if the condition fails to improve as anticipated.  I provided 20 minutes of non-face-to-face time during this encounter.   Barnie Gull, MD  [facility] MD/PA/NP OP Progress Note  06/05/2024 1:38 PM Destiny Orr  MRN:  [id number]  Chief Complaint:  Chief Complaint  Patient presents with   ADD   Anxiety   Depression   Follow-up   HPI: : This patient is a 43 year old separated white female who lives with her ex-husband and 1 son and 2 daughters and a granddaughter in [place].  She is on disability.   The patient returns for follow-up after 4 weeks regarding her depression anxiety and ADD.  Since I last saw her she has been seen by cardiology.  She has been placed on HCTZ for hypertension.  She did a Holter monitor but it only lasted about 3 days.  She states that she still has occasional palpitations but not as frequently.  She still wants to try something to help with her ADD so I suggested Strattera  and she is in agreement.  She states that for what ever reason she is having a lot more anxiety.  She does not think the Valium  is working.  She has tried several other medicines and she thinks overall the Xanax  worked best and would like to  return to this.  Her mood has been fairly stable and she states she is under less stress at home.  She denies thoughts of self-harm or suicide.  Her sleep is variable Visit Diagnosis:    ICD-10-CM   1. Major depressive disorder, recurrent, severe without psychotic features (HCC)  F33.2     2. GAD (generalized anxiety disorder)  F41.1     3. Attention deficit hyperactivity disorder (ADHD), predominantly hyperactive type  F90.1       Past Psychiatric History: Long-term outpatient treatment for depression and anxiety  Past Medical History:  Past Medical History:  Diagnosis Date   Abnormal Papanicolaou smear of cervix with positive human papilloma virus (HPV) test 10/11/2018   LSIL +HPV, get colpo___   Abnormal uterine bleeding (AUB) 12/09/2014   Anxiety    Arthritis    Depression    DJD (degenerative joint disease)    Dumping syndrome    Dysmenorrhea 07/15/2014   Fatigue 12/24/2012   Fibromyalgia diagnosed April 2016   Headache    Hx of migraine headaches 12/24/2012   Inappropriate sinus tachycardia (HCC)    Irregular menstrual bleeding 07/15/2014   Pelvic pain in female 03/03/2016   Pneumothorax, spontaneous, tension    Post concussion syndrome    POTS (postural orthostatic tachycardia syndrome)    PTSD (post-traumatic stress disorder)    Scoliosis    Thyroid  disease    Tick bite 03/03/2016   Vitamin  B 12 deficiency     Past Surgical History:  Procedure Laterality Date   CESAREAN SECTION     COLONOSCOPY     endooscopy     KNEE SURGERY Left    ROTATOR CUFF REPAIR Left     Family Psychiatric History: See below  Family History:  Family History  Problem Relation Age of Onset   Hypertension Father    Atrial fibrillation Father    Alcohol abuse Father    Heart disease Father    Other Father        Intraductal Papillary Mucinous Neoplasm (IPMN)   High Cholesterol Father    Anxiety disorder Maternal Aunt    Anxiety disorder Maternal Uncle    Bipolar disorder Maternal  Uncle    Breast cancer Maternal Uncle        CML   Cancer Maternal Grandmother        skin    Heart disease Maternal Grandmother    Other Maternal Grandmother        had thyroid  removed   Breast cancer Maternal Grandmother    Cancer Maternal Grandfather        bladder,lung   Heart disease Paternal Grandfather    COPD Paternal Grandfather    Hypertension Paternal Grandfather    Diabetes Paternal Grandfather    Stroke Paternal Actor    Healthy Daughter    Healthy Daughter    Healthy Son    Leukemia Other    Colon cancer Other        lung-2 maternal great uncles    Social History:  Social History   Socioeconomic History   Marital status: Married    Spouse name: Not on file   Number of children: 3   Years of education: Not on file   Highest education level: Not on file  Occupational History   Not on file  Tobacco Use   Smoking status: Former    Current packs/day: 0.00    Average packs/day: 1 pack/day for 24.5 years (24.5 ttl pk-yrs)    Types: Cigarettes    Start date: 03/13/1997    Quit date: 09/25/2021    Years since quitting: 2.6    Passive exposure: Past   Smokeless tobacco: Never   Tobacco comments:    working on it Was smoking 2.5 packs a day  Vaping Use   Vaping status: Former  Substance and Sexual Activity   Alcohol use: No   Drug use: No   Sexual activity: Yes    Partners: Male    Birth control/protection: None, Condom  Other Topics Concern   Not on file  Social History Narrative   Not on file   Social Drivers of Health   Financial Resource Strain: High Risk (07/03/2022)   Overall Financial Resource Strain (CARDIA)    Difficulty of Paying Living Expenses: Very hard  Food Insecurity: Low Risk  (05/01/2024)   Received from [facility]   Hunger Vital Sign    Within the past 12 months, you worried that your food would run out before you got money to buy more: Never true    Within the past 12 months, the food you bought just didn't last and  you didn't have money to get more. : Never true  Recent Concern: Food Insecurity - Medium Risk (04/06/2024)   Received from [facility]   Hunger Vital Sign    Within the past 12 months, you worried that your food would run out before you got money to  buy more: Sometimes true    Within the past 12 months, the food you bought just didn't last and you didn't have money to get more. : Sometimes true  Transportation Needs: No Transportation Needs (05/01/2024)   Received from [facility]   Transportation    In the past 12 months, has lack of reliable transportation kept you from medical appointments, meetings, work or from getting things needed for daily living? : No  Physical Activity: Inactive (01/07/2021)   Exercise Vital Sign    Days of Exercise per Week: 0 days    Minutes of Exercise per Session: 0 min  Stress: Stress Concern Present (07/03/2022)   [employer] of Occupational Health - Occupational Stress Questionnaire    Feeling of Stress : Very much  Social Connections: Moderately Integrated (07/03/2022)   Social Connection and Isolation Panel    Frequency of Communication with Friends and Family: More than three times a week    Frequency of Social Gatherings with Friends and Family: Once a week    Attends Religious Services: 1 to 4 times per year    Active Member of [employer] or Organizations: No    Attends Banker Meetings: Never    Marital Status: Married    Allergies:  Allergies  Allergen Reactions   Prozac  [Fluoxetine ]     Suicidal thoughts   Topiramate Other (See Comments)    Topamax-dizziness   Lexapro  [Escitalopram  Oxalate] Other (See Comments)    Flat affect, No emotions    Metabolic Disorder Labs: Lab Results  Component Value Date   HGBA1C 5.5 10/01/2023   MPG 108 01/07/2013   Lab Results  Component Value Date   PROLACTIN 13.2 06/12/2016   Lab Results  Component Value Date   CHOL 204 (H) 01/07/2021   TRIG 132 01/07/2021   HDL 72 01/07/2021    CHOLHDL 2.8 01/07/2021   VLDL 31 01/07/2013   LDLCALC 109 (H) 01/07/2021   LDLCALC 80 10/08/2018   Lab Results  Component Value Date   TSH 0.65 10/01/2023   TSH 4.080 01/07/2021    Therapeutic Level Labs: Lab Results  Component Value Date   LITHIUM  0.45 (L) 01/27/2020   LITHIUM  0.24 (L) 09/03/2019   No results found for: VALPROATE No results found for: CBMZ  Current Medications: Current Outpatient Medications  Medication Sig Dispense Refill   atomoxetine  (STRATTERA ) 25 MG capsule Take 1 capsule (25 mg total) by mouth every morning. 30 capsule 2   Acetaminophen  (TYLENOL  PO) Take by mouth as needed.     ALPRAZolam  (XANAX ) 1 MG tablet Take 1 tablet (1 mg total) by mouth 4 (four) times daily. 120 tablet 2   carbamazepine  (TEGRETOL ) 200 MG tablet Take 1 tablet (200 mg total) by mouth 2 (two) times daily. 60 tablet 2   citalopram  (CELEXA ) 20 MG tablet Take 1 tablet (20 mg total) by mouth 2 (two) times daily. 60 tablet 2   gabapentin  (NEURONTIN ) 300 MG capsule 1 PO q HS, may increase to 1 PO TID if tolerated 90 capsule 2   hyoscyamine  (LEVSIN ) 0.125 MG tablet Take 1 tablet (0.125 mg total) by mouth every 6 (six) hours as needed for cramping. (Patient not taking: Reported on 02/22/2024) 90 tablet 3   ibuprofen  (ADVIL ,MOTRIN ) 200 MG tablet Take 400-600 mg by mouth every 6 (six) hours as needed for mild pain or moderate pain.     traZODone  (DESYREL ) 50 MG tablet Take 1 tablet (50 mg total) by mouth at bedtime. 30 tablet 2  No current facility-administered medications for this visit.     Musculoskeletal: Strength & Muscle Tone: within normal limits Gait & Station: normal Patient leans: N/A  Psychiatric Specialty Exam: Review of Systems  Cardiovascular:  Positive for palpitations.  Psychiatric/Behavioral:  Positive for decreased concentration. The patient is nervous/anxious.   All other systems reviewed and are negative.   There were no vitals taken for this visit.There is no  height or weight on file to calculate BMI.  General Appearance: Casual and Fairly Groomed  Eye Contact:  Good  Speech:  Clear and Coherent  Volume:  Normal  Mood:  Anxious  Affect:  Congruent  Thought Process:  Goal Directed  Orientation:  Full (Time, Place, and Person)  Thought Content: Rumination   Suicidal Thoughts:  No  Homicidal Thoughts:  No  Memory:  Immediate;   Good Recent;   Good Remote;   Good  Judgement:  Good  Insight:  Fair  Psychomotor Activity:  Decreased  Concentration:  Concentration: Poor and Attention Span: Poor  Recall:  Good  Fund of Knowledge: Good  Language: Good  Akathisia:  No  Handed:  Right  AIMS (if indicated): not done  Assets:  Communication Skills Desire for Improvement Resilience Social Support Talents/Skills  ADL's:  Intact  Cognition: WNL  Sleep:  Fair   Screenings: GAD-7    Advertising copywriter from 12/07/2023 in [facility] [place] Office Visit from 07/03/2022 in [facility] for [facility] at [facility] Counselor from 05/31/2021 in [facility] at [place] Office Visit from 01/07/2021 in [facility] at [facility]  Total GAD-7 Score 21 21 20 16    Mini-Mental    Flowsheet Row Office Visit from 10/25/2016 in [facility]  Total Score (max 30 points ) 29   PHQ2-9    Flowsheet Row Counselor from 12/07/2023 in [facility] at [place] Office Visit from 07/03/2022 in [facility] for [facility] at [facility] Video Visit from 06/29/2022 in [facility] [place] Video Visit from 04/11/2022 in [facility] at [place] Video Visit from 03/03/2022 in [facility] at [facility] Total Score 3 6 1 2 5   PHQ-9 Total Score 14 21 -- 6 13   Flowsheet Row UC from 03/07/2024 in [facility] ED from 09[facility]21/2023 in [facility] at [facility] UC from 07/10/2023 in [facility] at [facility] RISK CATEGORY No Risk No Risk No Risk     Assessment and Plan: This patient is a 43 year old female with a history of depression anxiety chronic fatigue ADD and borderline personality disorder.  She does not think the Valium  is helping so we will go back to Xanax  1 mg 4 times daily for anxiety.  We will also start Strattera  25 mg every morning for ADD.  She will continue Celexa  20 mg twice daily for depression, carbamazepine  200 mg twice daily for mood stabilization gabapentin  300 mg 3 times daily for anxiety and nerve pain and trazodone  50 mg at bedtime as needed for sleep.  She will return to see me in 4 weeks  Collaboration of Care: Collaboration of Care: Referral or follow-up with counselor/therapist AEB patient will continue therapy with Jerel Pepper in our office  Patient/Guardian was advised Release of Information must be obtained prior to any record release  in order to collaborate their care with an outside provider. Patient/Guardian was advised if they have not already done so to contact the registration department to sign all necessary forms in order for us  to release information regarding their care.   Consent: Patient/Guardian gives verbal consent for treatment and assignment of benefits for services provided during this visit. Patient/Guardian expressed understanding and agreed to proceed.    Barnie Gull, MD 06/05/2024, 1:38 PM

## 2024-06-24 ENCOUNTER — Ambulatory Visit (HOSPITAL_COMMUNITY): Admitting: Clinical

## 2024-07-03 ENCOUNTER — Encounter (HOSPITAL_COMMUNITY): Payer: Self-pay | Admitting: Psychiatry

## 2024-07-03 ENCOUNTER — Telehealth (HOSPITAL_COMMUNITY): Admitting: Psychiatry

## 2024-07-03 DIAGNOSIS — F411 Generalized anxiety disorder: Secondary | ICD-10-CM | POA: Diagnosis not present

## 2024-07-03 DIAGNOSIS — F901 Attention-deficit hyperactivity disorder, predominantly hyperactive type: Secondary | ICD-10-CM | POA: Diagnosis not present

## 2024-07-03 DIAGNOSIS — F332 Major depressive disorder, recurrent severe without psychotic features: Secondary | ICD-10-CM | POA: Diagnosis not present

## 2024-07-03 MED ORDER — CARBAMAZEPINE 200 MG PO TABS: 200.0000 mg | ORAL_TABLET | Freq: Two times a day (BID) | ORAL | 2 refills | Status: DC

## 2024-07-03 MED ORDER — TRAZODONE HCL 50 MG PO TABS: 50.0000 mg | ORAL_TABLET | Freq: Every day | ORAL | 2 refills | Status: DC

## 2024-07-03 MED ORDER — ALPRAZOLAM 1 MG PO TABS: 1.0000 mg | ORAL_TABLET | Freq: Four times a day (QID) | ORAL | 2 refills | Status: DC

## 2024-07-03 MED ORDER — CITALOPRAM HYDROBROMIDE 20 MG PO TABS: 20.0000 mg | ORAL_TABLET | Freq: Two times a day (BID) | ORAL | 2 refills | Status: DC

## 2024-07-03 MED ORDER — GABAPENTIN 300 MG PO CAPS: ORAL_CAPSULE | ORAL | 2 refills | Status: DC

## 2024-07-03 NOTE — Progress Notes (Signed)
 Virtual Visit via Video Note  I connected with Destiny Orr on 07/03/24 at  1:00 PM EDT by a video enabled telemedicine application and verified that I am speaking with the correct person using two identifiers.  Location: Patient: home Provider: office   I discussed the limitations of evaluation and management by telemedicine and the availability of in person appointments. The patient expressed understanding and agreed to proceed.    I discussed the assessment and treatment plan with the patient. The patient was provided an opportunity to ask questions and all were answered. The patient agreed with the plan and demonstrated an understanding of the instructions.   The patient was advised to call back or seek an in-person evaluation if the symptoms worsen or if the condition fails to improve as anticipated.  I provided 20 minutes of non-face-to-face time during this encounter.   Barnie Gull, MD  [facility] MD/PA/NP OP Progress Note  07/03/2024 1:21 PM Destiny Orr  MRN:  [id number]  Chief Complaint:  Chief Complaint  Patient presents with   Anxiety   Depression   ADD   Follow-up   HPI: This patient is a 43 year old separated white female who lives with her ex-husband and 1 son and 2 daughters and a granddaughter in [place]. She is on disability.   The patient returns for follow-up after 4 weeks regarding her depression anxiety and ADD inattentive type.  She states that she is generally doing fairly well.  She has tried the Strattera  for ADD but it caused dizziness and she stopped it.  The Xanax  is working better than clonazepam  for her anxiety.  She denies significant depression thoughts of self-harm or suicide.  She states she is not happy but she is not seriously depressed and feels stable at this point.  She is sleeping well.  She would like to get back on a stimulant but she has an appointment with cardiology and I would like to defer to their opinion. Visit Diagnosis:    ICD-10-CM    1. Major depressive disorder, recurrent, severe without psychotic features (HCC)  F33.2     2. GAD (generalized anxiety disorder)  F41.1     3. Attention deficit hyperactivity disorder (ADHD), predominantly hyperactive type  F90.1       Past Psychiatric History: Long-term outpatient treatment for depression and anxiety  Past Medical History:  Past Medical History:  Diagnosis Date   Abnormal Papanicolaou smear of cervix with positive human papilloma virus (HPV) test 10/11/2018   LSIL +HPV, get colpo___   Abnormal uterine bleeding (AUB) 12/09/2014   Anxiety    Arthritis    Depression    DJD (degenerative joint disease)    Dumping syndrome    Dysmenorrhea 07/15/2014   Fatigue 12/24/2012   Fibromyalgia diagnosed April 2016   Headache    Hx of migraine headaches 12/24/2012   Inappropriate sinus tachycardia    Irregular menstrual bleeding 07/15/2014   Pelvic pain in female 03/03/2016   Pneumothorax, spontaneous, tension    Post concussion syndrome    POTS (postural orthostatic tachycardia syndrome)    PTSD (post-traumatic stress disorder)    Scoliosis    Thyroid  disease    Tick bite 03/03/2016   Vitamin B 12 deficiency     Past Surgical History:  Procedure Laterality Date   CESAREAN SECTION     COLONOSCOPY     endooscopy     KNEE SURGERY Left    ROTATOR CUFF REPAIR Left     Family Psychiatric History: See  below  Family History:  Family History  Problem Relation Age of Onset   Hypertension Father    Atrial fibrillation Father    Alcohol abuse Father    Heart disease Father    Other Father        Intraductal Papillary Mucinous Neoplasm (IPMN)   High Cholesterol Father    Anxiety disorder Maternal Aunt    Anxiety disorder Maternal Uncle    Bipolar disorder Maternal Uncle    Breast cancer Maternal Uncle        CML   Cancer Maternal Grandmother        skin    Heart disease Maternal Grandmother    Other Maternal Grandmother        had thyroid  removed   Breast cancer  Maternal Grandmother    Cancer Maternal Grandfather        bladder,lung   Heart disease Paternal Grandfather    COPD Paternal Grandfather    Hypertension Paternal Grandfather    Diabetes Paternal Grandfather    Stroke Paternal Actor    Healthy Daughter    Healthy Daughter    Healthy Son    Leukemia Other    Colon cancer Other        lung-2 maternal great uncles    Social History:  Social History   Socioeconomic History   Marital status: Married    Spouse name: Not on file   Number of children: 3   Years of education: Not on file   Highest education level: Not on file  Occupational History   Not on file  Tobacco Use   Smoking status: Former    Current packs/day: 0.00    Average packs/day: 1 pack/day for 24.5 years (24.5 ttl pk-yrs)    Types: Cigarettes    Start date: 03/13/1997    Quit date: 09/25/2021    Years since quitting: 2.7    Passive exposure: Past   Smokeless tobacco: Never   Tobacco comments:    working on it Was smoking 2.5 packs a day  Vaping Use   Vaping status: Former  Substance and Sexual Activity   Alcohol use: No   Drug use: No   Sexual activity: Yes    Partners: Male    Birth control/protection: None, Condom  Other Topics Concern   Not on file  Social History Narrative   Not on file   Social Drivers of Health   Financial Resource Strain: High Risk (07/03/2022)   Overall Financial Resource Strain (CARDIA)    Difficulty of Paying Living Expenses: Very hard  Food Insecurity: Low Risk  (05/01/2024)   Received from [facility]   Hunger Vital Sign    Within the past 12 months, you worried that your food would run out before you got money to buy more: Never true    Within the past 12 months, the food you bought just didn't last and you didn't have money to get more. : Never true  Recent Concern: Food Insecurity - Medium Risk (04/06/2024)   Received from [facility]   Hunger Vital Sign    Within the past 12 months, you worried that your  food would run out before you got money to buy more: Sometimes true    Within the past 12 months, the food you bought just didn't last and you didn't have money to get more. : Sometimes true  Transportation Needs: No Transportation Needs (05/01/2024)   Received from [facility]    In  the past 12 months, has lack of reliable transportation kept you from medical appointments, meetings, work or from getting things needed for daily living? : No  Physical Activity: Inactive (01/07/2021)   Exercise Vital Sign    Days of Exercise per Week: 0 days    Minutes of Exercise per Session: 0 min  Stress: Stress Concern Present (07/03/2022)   [employer] of Occupational Health - Occupational Stress Questionnaire    Feeling of Stress : Very much  Social Connections: Moderately Integrated (07/03/2022)   Social Connection and Isolation Panel    Frequency of Communication with Friends and Family: More than three times a week    Frequency of Social Gatherings with Friends and Family: Once a week    Attends Religious Services: 1 to 4 times per year    Active Member of [employer] or Organizations: No    Attends Banker Meetings: Never    Marital Status: Married    Allergies:  Allergies  Allergen Reactions   Prozac  [Fluoxetine ]     Suicidal thoughts   Topiramate Other (See Comments)    Topamax-dizziness   Lexapro  [Escitalopram  Oxalate] Other (See Comments)    Flat affect, No emotions    Metabolic Disorder Labs: Lab Results  Component Value Date   HGBA1C 5.5 10/01/2023   MPG 108 01/07/2013   Lab Results  Component Value Date   PROLACTIN 13.2 06/12/2016   Lab Results  Component Value Date   CHOL 204 (H) 01/07/2021   TRIG 132 01/07/2021   HDL 72 01/07/2021   CHOLHDL 2.8 01/07/2021   VLDL 31 01/07/2013   LDLCALC 109 (H) 01/07/2021   LDLCALC 80 10/08/2018   Lab Results  Component Value Date   TSH 0.65 10/01/2023   TSH 4.080 01/07/2021    Therapeutic Level  Labs: Lab Results  Component Value Date   LITHIUM  0.45 (L) 01/27/2020   LITHIUM  0.24 (L) 09/03/2019   No results found for: VALPROATE No results found for: CBMZ  Current Medications: Current Outpatient Medications  Medication Sig Dispense Refill   Acetaminophen  (TYLENOL  PO) Take by mouth as needed.     ALPRAZolam  (XANAX ) 1 MG tablet Take 1 tablet (1 mg total) by mouth 4 (four) times daily. 120 tablet 2   carbamazepine  (TEGRETOL ) 200 MG tablet Take 1 tablet (200 mg total) by mouth 2 (two) times daily. 60 tablet 2   citalopram  (CELEXA ) 20 MG tablet Take 1 tablet (20 mg total) by mouth 2 (two) times daily. 60 tablet 2   gabapentin  (NEURONTIN ) 300 MG capsule 1 PO q HS, may increase to 1 PO TID if tolerated 90 capsule 2   hyoscyamine  (LEVSIN ) 0.125 MG tablet Take 1 tablet (0.125 mg total) by mouth every 6 (six) hours as needed for cramping. (Patient not taking: Reported on 02/22/2024) 90 tablet 3   ibuprofen  (ADVIL ,MOTRIN ) 200 MG tablet Take 400-600 mg by mouth every 6 (six) hours as needed for mild pain or moderate pain.     traZODone  (DESYREL ) 50 MG tablet Take 1 tablet (50 mg total) by mouth at bedtime. 30 tablet 2   No current facility-administered medications for this visit.     Musculoskeletal: Strength & Muscle Tone: within normal limits Gait & Station: normal Patient leans: N/A  Psychiatric Specialty Exam: Review of Systems  All other systems reviewed and are negative.   There were no vitals taken for this visit.There is no height or weight on file to calculate BMI.  General Appearance: Casual and Fairly Groomed  Eye Contact:  Good  Speech:  Clear and Coherent  Volume:  Normal  Mood:  Euthymic  Affect:  Flat  Thought Process:  Goal Directed  Orientation:  Full (Time, Place, and Person)  Thought Content: WDL   Suicidal Thoughts:  No  Homicidal Thoughts:  No  Memory:  Immediate;   Good Recent;   Good Remote;   Fair  Judgement:  Good  Insight:  Fair  Psychomotor  Activity:  Normal  Concentration:  Concentration: Fair and Attention Span: Fair  Recall:  Good  Fund of Knowledge: Good  Language: Good  Akathisia:  No  Handed:  Right  AIMS (if indicated): not done  Assets:  Communication Skills Desire for Improvement Resilience Social Support  ADL's:  Intact  Cognition: WNL  Sleep:  Good   Screenings: GAD-7    Advertising copywriter from 12/07/2023 in [facility] at [place] Office Visit from 07/03/2022 in [facility] Counselor from 05/31/2021 in [facility] [place] Office Visit from 01/07/2021 in [facility] at [facility]  Total GAD-7 Score 21 21 20 16    Mini-Mental    Flowsheet Row Office Visit from 10/25/2016 in [facility]  Total Score (max 30 points ) 29   PHQ2-9    Flowsheet Row Counselor from 12/07/2023 in [facility] at [place] Office Visit from 07/03/2022 in [facility] at [facility] Video Visit from 06/29/2022 in [facility] at [place] Video Visit from 04/11/2022 in [facility] at [place] Video Visit from 03/03/2022 in [facility] at [facility] Total Score 3 6 1 2 5   PHQ-9 Total Score 14 21 -- 6 13   Flowsheet Row UC from 03/07/2024 in [facility] at [facility] ED from 09/21/2023 in [facility] at [facility] UC from 07/10/2023 in [facility] at [facility] RISK CATEGORY No Risk No Risk No Risk     Assessment and Plan: This patient is a 43 year old female with a history of major depression, recurrent, generalized anxiety disorder, chronic fatigue ADD and borderline personality disorder.  She seems to be more stable at least in terms of mood.  She will continue Xanax  1 mg 4  times daily for anxiety, Celexa  20 mg twice daily for depression, Tegretol  200 mg twice daily for mood stabilization, gabapentin  300 mg 3 times daily for anxiety and nerve pain and trazodone  50 mg at bedtime as needed for sleep.  She will return to see me in 3 months.  After she sees cardiology we can discuss stimulants  Collaboration of Care: Collaboration of Care: Referral or follow-up with counselor/therapist AEB patient will continue therapy with Jerel Pepper in our office  Patient/Guardian was advised Release of Information must be obtained prior to any record release in order to collaborate their care with an outside provider. Patient/Guardian was advised if they have not already done so to contact the registration department to sign all necessary forms in order for us  to release information regarding their care.   Consent: Patient/Guardian gives verbal consent for treatment and assignment of benefits for services provided during this visit. Patient/Guardian expressed understanding and agreed to proceed.    Barnie Gull, MD 07/03/2024, 1:21 PM

## 2024-07-18 ENCOUNTER — Ambulatory Visit (HOSPITAL_COMMUNITY): Admitting: Clinical

## 2024-07-18 DIAGNOSIS — F332 Major depressive disorder, recurrent severe without psychotic features: Secondary | ICD-10-CM

## 2024-07-18 DIAGNOSIS — F411 Generalized anxiety disorder: Secondary | ICD-10-CM | POA: Diagnosis not present

## 2024-07-18 NOTE — Progress Notes (Signed)
 Virtual Visit via Telephone Note   I connected with Destiny Orr on 07/18/24 at 9:00 AM EST by telephone and verified that I am speaking with the correct person using two identifiers.   Location: Patient: Home Provider: Office   I discussed the limitations, risks, security and privacy concerns of performing an evaluation and management service by telephone and the availability of in person appointments. I also discussed with the patient that there may be a patient responsible charge related to this service. The patient expressed understanding and agreed to proceed.     THERAPIST PROGRESS NOTE   Session Time: 9:00 AM-9:35 AM   Participation Level: Active   Behavioral Response: CasualAlertDepressed   Type of Therapy: Individual Therapy   Treatment Goals addressed: Coping for MH diagnosis   Interventions: CBT, Motivational Interviewing, Strength-based and Supportive   Summary: Destiny Orr is a 43 y.o. female who presents with Depression, ADHD, GAD.The OPT therapist worked with the patient for her ongoing outpatient. OPT treatment. The OPT therapist utilized Motivational Interviewing to assist in creating therapeutic repore. The patient in the session was engaged and work in collaboration giving feedback about her triggers and symptoms over the past few weeks and spoke about her health, her family interactions, and struggle with consistency with provider appointments due to problems with her insurance coverage. The patient spoke about physical health problems that cause her chronic pain and this impacting her day to day functioning, however, having limited treatment options. The OPT therapist utilized Cognitive Behavioral Therapy through cognitive restructuring as well as worked with the patient on self awareness and implementing coping strategies to assist in management of current stressors.The OPT therapist provided ongoing psychoeducation and support throughout the session with the patient. The  patient spoke about falling out of med therapy and counseling over the last few months due to her frustration with billing department saying she's out of network and then fixing the insurance coverage issue and then again billing her for sessions as if her insurance is not covering her treatments and due to this frustration she decided to stop attending sessions.   Suicidal/Homicidal: Nowithout intent/plan   Therapist Response: The OPT therapist worked with the patient for the patients scheduled session. The patient was engaged in her session and gave feedback in relation to triggers, symptoms, and behavior responses over the past few weeks i The patient spoke about her ongoing difficulty with functioning and adjusting in her daily tasks over the past few weeks due to physical health symptoms  The OPT therapist worked with the patient utilizing an in session Cognitive Behavioral Therapy exercise. The patient was responsive in the session and verbalized,  I am just living with my chronic pain and I need to get back on my med therapy and I have to have approval from my physical health providers and then my insurance with [employer] I keep running into coverage issues with them incorrectly stating I am out of network and I call and I have to wait and argue with someone and it gets fixed for awhile and then is messed up again..  The OPT therapist worked with the patient who continue to struggle with the loss of her family members and the ongoing after effect of his kids being in a serious car accident. The patient worked with the OPT therapist in implementing available coping strategies to help her with improving functioning as well as strategy to manage external stressors and emphasis on meeting basic needs areas.The OPT therapist emphasized the  importance of consistency in the OPT program and getting her med therapy re-established. The OPT therapist will continue treatment work with the patient in her next  scheduled session   Plan: Return again in 2/3 weeks.   Diagnosis:      Axis I: Major depressive disorder, recurrent, severe without psychotic features and GAD                           Axis II: No diagnosis       Collaboration of Care: Collaboration of Care (psychiatrist) Dr. Okey.    Patient/Guardian was advised Release of Information must be obtained prior to any record release in order to collaborate their care with an outside provider. Patient/Guardian was advised if they have not already done so to contact the registration department to sign all necessary forms in order for us  to release information regarding their care.    Consent: Patient/Guardian gives verbal consent for treatment and assignment of benefits for services provided during this visit. Patient/Guardian expressed understanding and agreed to proceed.    I discussed the assessment and treatment plan with the patient. The patient was provided an opportunity to ask questions and all were answered. The patient agreed with the plan and demonstrated an understanding of the instructions.   The patient was advised to call back or seek an in-person evaluation if the symptoms worsen or if the condition fails to improve as anticipated.   I provided 30 minutes of non-face-to-face time during this encounter.     [name], LCSW   07/18/2024

## 2024-07-21 ENCOUNTER — Encounter (HOSPITAL_COMMUNITY): Payer: Self-pay

## 2024-07-21 ENCOUNTER — Other Ambulatory Visit (HOSPITAL_COMMUNITY): Payer: Self-pay | Admitting: Psychiatry

## 2024-07-21 MED ORDER — AMPHETAMINE-DEXTROAMPHETAMINE 20 MG PO TABS: 20.0000 mg | ORAL_TABLET | Freq: Three times a day (TID) | ORAL | 0 refills | Status: AC

## 2024-07-21 MED ORDER — AMPHETAMINE-DEXTROAMPHETAMINE 20 MG PO TABS: 20.0000 mg | ORAL_TABLET | Freq: Three times a day (TID) | ORAL | 0 refills | Status: DC

## 2024-07-21 NOTE — Telephone Encounter (Signed)
 We can make it 2 moths since you're going to restart Adderall. I will send it in

## 2024-07-28 ENCOUNTER — Encounter: Payer: Self-pay | Admitting: Internal Medicine

## 2024-07-28 ENCOUNTER — Ambulatory Visit: Attending: Internal Medicine | Admitting: Internal Medicine

## 2024-07-28 NOTE — Progress Notes (Signed)
 Erroneous encounter - please disregard.

## 2024-08-04 ENCOUNTER — Encounter: Payer: Self-pay | Admitting: Radiology

## 2024-08-13 ENCOUNTER — Telehealth

## 2024-08-14 ENCOUNTER — Emergency Department (HOSPITAL_BASED_OUTPATIENT_CLINIC_OR_DEPARTMENT_OTHER)

## 2024-08-14 ENCOUNTER — Other Ambulatory Visit: Payer: Self-pay

## 2024-08-14 ENCOUNTER — Emergency Department (HOSPITAL_BASED_OUTPATIENT_CLINIC_OR_DEPARTMENT_OTHER): Admitting: Radiology

## 2024-08-14 ENCOUNTER — Emergency Department (HOSPITAL_BASED_OUTPATIENT_CLINIC_OR_DEPARTMENT_OTHER)
Admission: EM | Admit: 2024-08-14 | Discharge: 2024-08-14 | Disposition: A | Discharge status: Home / Self Care | Attending: Emergency Medicine | Admitting: Emergency Medicine

## 2024-08-14 DIAGNOSIS — R509 Fever, unspecified: Secondary | ICD-10-CM

## 2024-08-14 DIAGNOSIS — R197 Diarrhea, unspecified: Secondary | ICD-10-CM | POA: Diagnosis not present

## 2024-08-14 DIAGNOSIS — J0120 Acute ethmoidal sinusitis, unspecified: Secondary | ICD-10-CM | POA: Insufficient documentation

## 2024-08-14 DIAGNOSIS — R051 Acute cough: Secondary | ICD-10-CM | POA: Diagnosis not present

## 2024-08-14 LAB — CBC
HCT: 42 % (ref 36.0–46.0)
Hemoglobin: 14.5 g/dL (ref 12.0–15.0)
MCH: 30.3 pg (ref 26.0–34.0)
MCHC: 34.5 g/dL (ref 30.0–36.0)
MCV: 87.7 fL (ref 80.0–100.0)
Platelets: 358 K/uL (ref 150–400)
RBC: 4.79 MIL/uL (ref 3.87–5.11)
RDW: 11.6 % (ref 11.5–15.5)
WBC: 9.7 K/uL (ref 4.0–10.5)
nRBC: 0 % (ref 0.0–0.2)

## 2024-08-14 LAB — URINALYSIS, W/ REFLEX TO CULTURE (INFECTION SUSPECTED)
Bilirubin Urine: NEGATIVE
Glucose, UA: NEGATIVE mg/dL
Hgb urine dipstick: NEGATIVE
Ketones, ur: NEGATIVE mg/dL
Leukocytes,Ua: NEGATIVE
Nitrite: NEGATIVE
Protein, ur: NEGATIVE mg/dL
Specific Gravity, Urine: 1.007 (ref 1.005–1.030)
pH: 6 (ref 5.0–8.0)

## 2024-08-14 LAB — HEPATIC FUNCTION PANEL
ALT: 12 U/L (ref 0–44)
AST: 17 U/L (ref 15–41)
Albumin: 4.5 g/dL (ref 3.5–5.0)
Alkaline Phosphatase: 111 U/L (ref 38–126)
Bilirubin, Direct: 0.1 mg/dL (ref 0.0–0.2)
Indirect Bilirubin: 0.2 mg/dL — ABNORMAL LOW (ref 0.3–0.9)
Total Bilirubin: 0.4 mg/dL (ref 0.0–1.2)
Total Protein: 7.7 g/dL (ref 6.5–8.1)

## 2024-08-14 LAB — BASIC METABOLIC PANEL WITH GFR
Anion gap: 12 (ref 5–15)
BUN: <5 mg/dL — ABNORMAL LOW (ref 6–20)
CO2: 24 mmol/L (ref 22–32)
Calcium: 9.9 mg/dL (ref 8.9–10.3)
Chloride: 102 mmol/L (ref 98–111)
Creatinine, Ser: 0.79 mg/dL (ref 0.44–1.00)
GFR, Estimated: >60 mL/min (ref >60)
Glucose, Bld: 122 mg/dL — ABNORMAL HIGH (ref 70–99)
Potassium: 4 mmol/L (ref 3.5–5.1)
Sodium: 138 mmol/L (ref 135–145)

## 2024-08-14 LAB — LACTIC ACID, PLASMA: Lactic Acid, Venous: 0.7 mmol/L (ref 0.5–1.9)

## 2024-08-14 LAB — MAGNESIUM: Magnesium: 2.1 mg/dL (ref 1.7–2.4)

## 2024-08-14 LAB — LIPASE, BLOOD: Lipase: 41 U/L (ref 11–51)

## 2024-08-14 LAB — PREGNANCY, URINE: Preg Test, Ur: NEGATIVE

## 2024-08-14 LAB — TROPONIN T, HIGH SENSITIVITY
Troponin T High Sensitivity: <15 ng/L (ref 0–19)
Troponin T High Sensitivity: <15 ng/L (ref 0–19)

## 2024-08-14 MED ORDER — ACETAMINOPHEN 500 MG PO TABS
1000.0000 mg | ORAL_TABLET | Freq: Once | ORAL | Status: AC
  Administered 2024-08-14: 1000 mg via ORAL
  Filled 2024-08-14: qty 2

## 2024-08-14 MED ORDER — PREDNISONE 20 MG PO TABS: 20.0000 mg | ORAL_TABLET | Freq: Every day | ORAL | 0 refills | Status: AC

## 2024-08-14 MED ORDER — DIPHENHYDRAMINE HCL 50 MG/ML IJ SOLN
12.5000 mg | Freq: Once | INTRAMUSCULAR | Status: AC
  Administered 2024-08-14: 12.5 mg via INTRAVENOUS
  Filled 2024-08-14: qty 1

## 2024-08-14 MED ORDER — FLUTICASONE PROPIONATE 50 MCG/ACT NA SUSP: 2.0000 | Freq: Every day | NASAL | 0 refills | Status: AC

## 2024-08-14 MED ORDER — CETIRIZINE HCL 10 MG PO TABS: 10.0000 mg | ORAL_TABLET | Freq: Every day | ORAL | 0 refills | Status: AC

## 2024-08-14 MED ORDER — SODIUM CHLORIDE 0.9 % IV BOLUS
1000.0000 mL | Freq: Once | INTRAVENOUS | Status: AC
  Administered 2024-08-14: 1000 mL via INTRAVENOUS

## 2024-08-14 MED ORDER — AMOXICILLIN-POT CLAVULANATE 875-125 MG PO TABS: 1.0000 | ORAL_TABLET | Freq: Two times a day (BID) | ORAL | 0 refills | Status: AC

## 2024-08-14 MED ORDER — IOHEXOL 350 MG/ML SOLN
100.0000 mL | Freq: Once | INTRAVENOUS | Status: AC | PRN
  Administered 2024-08-14: 100 mL via INTRAVENOUS

## 2024-08-14 MED ORDER — PROCHLORPERAZINE EDISYLATE 10 MG/2ML IJ SOLN
10.0000 mg | Freq: Once | INTRAMUSCULAR | Status: AC
  Administered 2024-08-14: 10 mg via INTRAVENOUS
  Filled 2024-08-14: qty 2

## 2024-08-14 MED ORDER — KETOROLAC TROMETHAMINE 15 MG/ML IJ SOLN
15.0000 mg | Freq: Once | INTRAMUSCULAR | Status: DC
  Filled 2024-08-14: qty 1

## 2024-08-14 NOTE — ED Provider Notes (Signed)
 Center EMERGENCY DEPARTMENT AT [facility] Provider Note   CSN: [id number] Arrival date & time: 08/14/24  1311    Patient presents with: Tachycardia   Destiny Orr is a 43 y.o. female here for evaluation of tachycardia and feeling poorly.  States last week she had a viral infection with a cough, rhinorrhea, loose stool, headache.  Had a fever at that time.  She states she still continues to feel poorly and have a fever up to 101 at home.  Feels generally weak.  Feels like she cannot take a deep breath, blood-tinged sputum, pleuritic chest pain.  Does not radiate.  Nausea and loose stool resolved.  No lower extremity edema.  No history of PE or DVT however does have a history of Raynaud's, worked up for possible lupus with endocrinology  Positive--positive ANA according to patient.  She states she has a horrible headache however denies any sudden onset thunderclap headache.  No neck stiffness or neck rigidity.  No vision changes, unilateral weakness. Continued green rhinorrhea and facial pressure.   HPI     Prior to Admission medications   Medication Sig Start Date End Date Taking? Authorizing Provider  amoxicillin -clavulanate (AUGMENTIN ) 875-125 MG tablet Take 1 tablet by mouth every 12 (twelve) hours. 08/14/24  Yes Tiffane Sheldon A, PA-C  cetirizine (ZYRTEC ALLERGY) 10 MG tablet Take 1 tablet (10 mg total) by mouth daily. 08/14/24  Yes Journey Ratterman A, PA-C  fluticasone (FLONASE) 50 MCG/ACT nasal spray Place 2 sprays into both nostrils daily. 08/14/24  Yes Anayla Giannetti A, PA-C  predniSONE  (DELTASONE ) 20 MG tablet Take 1 tablet (20 mg total) by mouth daily for 5 days. 08/14/24 08/19/24 Yes Nieve Rojero A, PA-C  Acetaminophen  (TYLENOL  PO) Take by mouth as needed.    [provider]  ALPRAZolam  (XANAX ) 1 MG tablet Take 1 tablet (1 mg total) by mouth 4 (four) times daily. 07/03/24 07/03/25  Okey Barnie SAUNDERS, MD  amphetamine -dextroamphetamine  (ADDERALL) 20 MG  tablet Take 1 tablet (20 mg total) by mouth 3 (three) times daily. 07/21/24 07/21/25  Okey Barnie SAUNDERS, MD  amphetamine -dextroamphetamine  (ADDERALL) 20 MG tablet Take 1 tablet (20 mg total) by mouth 3 (three) times daily. 07/21/24 07/21/25  Okey Barnie SAUNDERS, MD  carbamazepine  (TEGRETOL ) 200 MG tablet Take 1 tablet (200 mg total) by mouth 2 (two) times daily. 07/03/24   Okey Barnie SAUNDERS, MD  citalopram  (CELEXA ) 20 MG tablet Take 1 tablet (20 mg total) by mouth 2 (two) times daily. 07/03/24 07/03/25  Okey Barnie SAUNDERS, MD  gabapentin  (NEURONTIN ) 300 MG capsule 1 PO q HS, may increase to 1 PO TID if tolerated 07/03/24   Okey Barnie SAUNDERS, MD  hyoscyamine  (LEVSIN ) 0.125 MG tablet Take 1 tablet (0.125 mg total) by mouth every 6 (six) hours as needed for cramping. Patient not taking: Reported on 02/22/2024 10/05/23   Crain, Whitney L, PA  ibuprofen  (ADVIL ,MOTRIN ) 200 MG tablet Take 400-600 mg by mouth every 6 (six) hours as needed for mild pain or moderate pain.    [provider]  traZODone  (DESYREL ) 50 MG tablet Take 1 tablet (50 mg total) by mouth at bedtime. 07/03/24   Okey Barnie SAUNDERS, MD    Allergies: Prozac  [fluoxetine ], Topiramate, and Lexapro  [escitalopram  oxalate]    Review of Systems  Constitutional:  Positive for activity change, appetite change, chills, fatigue and fever.  HENT:  Positive for congestion, rhinorrhea, sinus pressure and sinus pain. Negative for sore throat, trouble swallowing and voice change.   Respiratory:  Positive for cough and shortness of breath.   Cardiovascular:  Positive for chest pain. Negative for palpitations and leg swelling.  Gastrointestinal:  Positive for diarrhea (resolved) and nausea (resolved). Negative for abdominal pain, anal bleeding, blood in stool, constipation, rectal pain and vomiting.  Genitourinary: Negative.   Musculoskeletal:  Positive for myalgias.  Skin: Negative.   Neurological:  Positive for weakness (generalized) and headaches. Negative for  dizziness, tremors, seizures, syncope, facial asymmetry, speech difficulty, light-headedness and numbness.  All other systems reviewed and are negative.   Updated Vital Signs BP 122/84   Pulse 77   Temp 98.2 F (36.8 C) (Oral)   Resp 19   SpO2 97%   Physical Exam Vitals and nursing note reviewed.  Constitutional:      General: She is not in acute distress.    Appearance: She is well-developed. She is not ill-appearing, toxic-appearing or diaphoretic.  HENT:     Head: Normocephalic and atraumatic.     Nose: Congestion and rhinorrhea present.     Mouth/Throat:     Mouth: Mucous membranes are moist.     Comments: Po clear, uvula midline.  Sublingual area soft.  No pooling of secretions Eyes:     Pupils: Pupils are equal, round, and reactive to light.  Neck:     Comments: No neck stiffness or neck rigidity Cardiovascular:     Rate and Rhythm: Tachycardia present.     Pulses:          Radial pulses are 2+ on the right side and 2+ on the left side.       Dorsalis pedis pulses are 2+ on the right side and 2+ on the left side.     Heart sounds: Normal heart sounds.  Pulmonary:     Effort: Pulmonary effort is normal. No respiratory distress.     Breath sounds: Normal breath sounds.  Abdominal:     General: Bowel sounds are normal. There is no distension.     Palpations: Abdomen is soft.     Tenderness: There is no abdominal tenderness. There is no right CVA tenderness, left CVA tenderness, guarding or rebound.  Musculoskeletal:        General: No swelling, tenderness, deformity or signs of injury. Normal range of motion.     Cervical back: Normal range of motion and neck supple.     Right lower leg: No edema.     Left lower leg: No edema.     Comments: No bony tenderness, compartment soft, full range of motion  Skin:    General: Skin is warm and dry.     Capillary Refill: Capillary refill takes less than 2 seconds.     Comments: No obvious rashes or lesions to exposed skin   Neurological:     General: No focal deficit present.     Mental Status: She is alert and oriented to person, place, and time.     Cranial Nerves: No cranial nerve deficit.     Sensory: No sensory deficit.     Motor: No weakness.     Coordination: Coordination normal.     Gait: Gait normal.  Psychiatric:        Mood and Affect: Mood normal.    (all labs ordered are listed, but only abnormal results are displayed) Labs Reviewed  BASIC METABOLIC PANEL WITH GFR - Abnormal; Notable for the following components:      Result Value   Glucose, Bld 122 (*)    BUN <5 (*)  All other components within normal limits  HEPATIC FUNCTION PANEL - Abnormal; Notable for the following components:   Indirect Bilirubin 0.2 (*)    All other components within normal limits  URINALYSIS, W/ REFLEX TO CULTURE (INFECTION SUSPECTED) - Abnormal; Notable for the following components:   Color, Urine COLORLESS (*)    Bacteria, UA RARE (*)    All other components within normal limits  CULTURE, BLOOD (ROUTINE X 2)  CULTURE, BLOOD (ROUTINE X 2)  CBC  PREGNANCY, URINE  LIPASE, BLOOD  MAGNESIUM  LACTIC ACID, PLASMA  TROPONIN T, HIGH SENSITIVITY  TROPONIN T, HIGH SENSITIVITY    EKG: None  Radiology: CT ANGIO HEAD NECK W WO CM Result Date: 08/14/2024 EXAM: CT HEAD WITHOUT CTA HEAD AND NECK WITHOUT AND WITH 08/14/2024 05:36:56 PM TECHNIQUE: CTA of the head and neck was performed without and with the administration of 100 mL of iohexol (OMNIPAQUE) 350 MG/ML injection. Noncontrast CT of the head with reconstructed 2-D images are also provided for review. Multiplanar 2D and/or 3D reformatted images are provided for review. Automated exposure control, iterative reconstruction, and/or weight based adjustment of the mA/kV was utilized to reduce the radiation dose to as low as reasonably achievable. COMPARISON: Head MRI 07/09/2019 CLINICAL HISTORY: Neuro deficit, acute, stroke suspected; headache, weakness. FINDINGS:  CT HEAD: BRAIN AND VENTRICLES: There is no evidence of an acute infarct, intracranial hemorrhage, mass, midline shift, hydrocephalus, or extra-axial fluid collection. Cerebral volume is normal. ORBITS: No acute abnormality. SINUSES AND MASTOIDS: Moderate mucosal thickening in the ethmoid sinuses and mild mucosal thickening elsewhere. Clear mastoid air cells. CTA NECK: AORTIC ARCH AND ARCH VESSELS: No dissection or arterial injury. No significant stenosis of the brachiocephalic or subclavian arteries. CERVICAL CAROTID ARTERIES: No dissection, arterial injury, or hemodynamically significant stenosis by NASCET criteria. CERVICAL VERTEBRAL ARTERIES: Moderately dominant left vertebral artery. No dissection, arterial injury, or significant stenosis. LUNGS AND MEDIASTINUM: Unremarkable. SOFT TISSUES: 1.7 cm right thyroid  nodule which has been previously evaluated by ultrasound which demonstrated long-term stability. No acute abnormality. BONES: Mild cervical spondylosis. Focally severe right facet arthrosis at C7-T1. CTA HEAD: ANTERIOR CIRCULATION: The intracranial internal carotid arteries are widely patent. ACAs and MCAs are patent without evidence of a proximal branch occlusion or significant proximal stenosis. No aneurysm. POSTERIOR CIRCULATION: The intracranial vertebral arteries are widely patent to the basilar. Patent PICA and SCA origins are visualized bilaterally. The basilar artery is widely patent. There are likely very small posterior communicating arteries bilaterally. Both PCAs are patent without evidence of a significant proximal stenosis. No aneurysm. OTHER: No dural venous sinus thrombosis on this non-dedicated study. IMPRESSION: 1. No acute intracranial abnormality. 2. No large vessel occlusion or significant stenosis in the head or neck. Electronically signed by: Dasie Hamburg MD 08/14/2024 06:09 PM EST RP Workstation: [id number]   CT Angio Chest PE W and/or Wo Contrast Result Date: 08/14/2024 CLINICAL  DATA:  Tachycardia shortness of breath chest pain EXAM: CT ANGIOGRAPHY CHEST WITH CONTRAST TECHNIQUE: Multidetector CT imaging of the chest was performed using the standard protocol during bolus administration of intravenous contrast. Multiplanar CT image reconstructions and MIPs were obtained to evaluate the vascular anatomy. RADIATION DOSE REDUCTION: This exam was performed according to the departmental dose-optimization program which includes automated exposure control, adjustment of the mA and/or kV according to patient size and/or use of iterative reconstruction technique. CONTRAST:  OMNIPAQUE IOHEXOL 350 MG/ML SOLN COMPARISON:  Chest x-ray 08/14/2024 FINDINGS: Cardiovascular: Satisfactory opacification of the pulmonary arteries to the segmental level. No  evidence of pulmonary embolism. Normal heart size. No pericardial effusion. Nonaneurysmal aorta. Mediastinum/Nodes: No enlarged mediastinal, hilar, or axillary lymph nodes. Thyroid  gland, trachea, and esophagus demonstrate no significant findings. Lungs/Pleura: Lungs are clear. No pleural effusion or pneumothorax. Upper Abdomen: No acute finding. Amorphous soft tissue density in the right upper quadrant incompletely visualized presumably represents unopacified small bowel. Musculoskeletal: No acute osseous abnormality Review of the MIP images confirms the above findings. IMPRESSION: Negative. No CT evidence for acute pulmonary embolus. Clear lung fields. Electronically Signed   By: Luke Bun M.D.   On: 08/14/2024 17:57   DG Chest 2 View Result Date: 08/14/2024 EXAM: 2 VIEW(S) XRAY OF THE CHEST 08/14/2024 01:37:00 PM COMPARISON: 09/21/2023 CLINICAL HISTORY: chest pain FINDINGS: LUNGS AND PLEURA: No focal pulmonary opacity. No pleural effusion. No pneumothorax. HEART AND MEDIASTINUM: No acute abnormality of the cardiac and mediastinal silhouettes. BONES AND SOFT TISSUES: No acute osseous abnormality. IMPRESSION: 1. No acute cardiopulmonary disease.  Electronically signed by: Lynwood Seip MD 08/14/2024 02:14 PM EST RP Workstation: [id number]     Procedures   Medications Ordered in the ED  prochlorperazine (COMPAZINE) injection 10 mg (10 mg Intravenous Given 08/14/24 1526)  diphenhydrAMINE (BENADRYL) injection 12.5 mg (12.5 mg Intravenous Given 08/14/24 1526)  sodium chloride  0.9 % bolus 1,000 mL (0 mLs Intravenous Stopped 08/14/24 1803)  acetaminophen  (TYLENOL ) tablet 1,000 mg (1,000 mg Oral Given 08/14/24 1548)  iohexol (OMNIPAQUE) 350 MG/ML injection 100 mL (100 mLs Intravenous Contrast Given 08/14/24 6127)   43 year old here for evaluation of feeling unwell.  Signs like she had a viral infection about 7 to 10 days ago.  No sick contacts.  At that time developed a headache however denies any sudden onset thunderclap headache, worst headache of her life.  She is a nonfocal neuroexam.  She also admits to some chest pain, blood-tinged sputum.  No history of PE or DVT.  She has had persistent fevers throughout the 7 days.  She denies any rashes or lesions, known tick bites.  She does not appear grossly fluid overloaded.  No clinical evidence of VTE.  Her abdomen is soft, nontender.  No urinary symptoms.  Does occasionally take NSAIDs however no history of EtOH use, IVDU, melena, blood per rectum.  Will plan on labs, imaging, reassess  Labs and imaging personally viewed interpreted:  CBC without leukocytosis, hemoglobin 14.5 Metabolic panel glucose 122 Hepatic fun panel wo acute abnormality Lipase 41 Mag 2.1 Lactic 0.7 Preg neg UA neg for infection Chest xray without cardiomegaly, pulm edema, pneumothorax, infiltrates EKG wo ischemic changes  Patient reassessed. Getting migraine cocktail. Plan on Ct imaging.  CTA head and neck without acute vascular abnormality, no obvious thrombus.  Does have some ethmoid mucosal edema.  Thyroid  nodule, previously ultrasounded, stable CT chest without acute abnormality  Patient reassessed.   Tachycardia improved with IV fluids.  Has been here in the ED for 5 hours without any development of fever.  She has no neck stiffness or neck rigidity.  Have low suspicion for meningitis.  She has a nonfocal neuroexam low suspicion for thrombus, CVA, dissection, meningitis, encephalitis.  CTA chest reassuring low suspicion for acute ACS, PE, dissection, pneumonia, pneumothorax, perforated viscus  Given her subjective fevers at home we will treat for sinusitis.  Add on steroids, Flonase, antihistamine.  Will have her follow-up outpatient, return for new or worsening symptoms.  Did get blood cultures here however does not meet criteria for sepsis at this time.  Do not feel she needs additional  labs or imaging or inpatient admission at this time.  Tolerating p.o. intake.  The patient has been appropriately medically screened and/or stabilized in the ED. I have low suspicion for any other emergent medical condition which would require further screening, evaluation or treatment in the ED or require inpatient management.  Patient is hemodynamically stable and in no acute distress.  Patient able to ambulate in department prior to ED.  Evaluation does not show acute pathology that would require ongoing or additional emergent interventions while in the emergency department or further inpatient treatment.  I have discussed the diagnosis with the patient and answered all questions.  Pain is been managed while in the emergency department and patient has no further complaints prior to discharge.  Patient is comfortable with plan discussed in room and is stable for discharge at this time.  I have discussed strict return precautions for returning to the emergency department.  Patient was encouraged to follow-up with PCP/specialist refer to at discharge.                                   Medical Decision Making Amount and/or Complexity of Data Reviewed External Data Reviewed: labs, radiology, ECG and notes. Labs:  ordered. Decision-making details documented in ED Course. Radiology: ordered and independent interpretation performed. Decision-making details documented in ED Course. ECG/medicine tests: ordered and independent interpretation performed. Decision-making details documented in ED Course.  Risk OTC drugs. Prescription drug management. Parenteral controlled substances. Decision regarding hospitalization. Diagnosis or treatment significantly limited by social determinants of health.        Final diagnoses:  Fever, unspecified fever cause  Acute non-recurrent ethmoidal sinusitis  Acute cough    ED Discharge Orders          Ordered    amoxicillin -clavulanate (AUGMENTIN ) 875-125 MG tablet  Every 12 hours        08/14/24 1813    predniSONE  (DELTASONE ) 20 MG tablet  Daily        08/14/24 1813    fluticasone (FLONASE) 50 MCG/ACT nasal spray  Daily        08/14/24 1813    cetirizine (ZYRTEC ALLERGY) 10 MG tablet  Daily        08/14/24 1813               Kassandra Meriweather A, PA-C 08/14/24 1817    Emil Share, DO 08/15/24 [phone]

## 2024-08-14 NOTE — ED Notes (Signed)
 Patient transported to CT

## 2024-08-14 NOTE — ED Triage Notes (Signed)
 Patient reports recent recent viral illness. States now when she moves around she notices her heart rate it fast and she gets sweaty and short of breath. States some chest pain with episodes.

## 2024-08-14 NOTE — Discharge Instructions (Addendum)
 It was a pleasure taking care of you here today  We are treating you for sinus infection.  Take the antibiotics as prescribed.  I have also added on Flonase, Zyrtec and a short course of steroids.  You may continue taking Tylenol  ibuprofen  as needed at home for fever or pain  Follow up outpatient with primary care provider in 2 days for reevaluation or>>return for new or worsening symptoms

## 2024-08-18 ENCOUNTER — Ambulatory Visit (HOSPITAL_COMMUNITY): Admitting: Clinical

## 2024-08-19 LAB — CULTURE, BLOOD (ROUTINE X 2)
Culture: NO GROWTH
Culture: NO GROWTH
Special Requests: ADEQUATE

## 2024-09-02 ENCOUNTER — Encounter (HOSPITAL_COMMUNITY): Payer: Self-pay | Admitting: Psychiatry

## 2024-09-02 ENCOUNTER — Telehealth (INDEPENDENT_AMBULATORY_CARE_PROVIDER_SITE_OTHER): Admitting: Psychiatry

## 2024-09-02 DIAGNOSIS — F901 Attention-deficit hyperactivity disorder, predominantly hyperactive type: Secondary | ICD-10-CM

## 2024-09-02 DIAGNOSIS — F332 Major depressive disorder, recurrent severe without psychotic features: Secondary | ICD-10-CM | POA: Diagnosis not present

## 2024-09-02 DIAGNOSIS — F411 Generalized anxiety disorder: Secondary | ICD-10-CM

## 2024-09-02 MED ORDER — GABAPENTIN 300 MG PO CAPS: ORAL_CAPSULE | ORAL | 2 refills | Status: AC

## 2024-09-02 MED ORDER — TRAZODONE HCL 50 MG PO TABS: 50.0000 mg | ORAL_TABLET | Freq: Every day | ORAL | 2 refills | Status: DC

## 2024-09-02 MED ORDER — DESVENLAFAXINE SUCCINATE ER 50 MG PO TB24: 50.0000 mg | ORAL_TABLET | Freq: Every day | ORAL | 3 refills | Status: DC

## 2024-09-02 MED ORDER — AMPHETAMINE-DEXTROAMPHETAMINE 20 MG PO TABS: 20.0000 mg | ORAL_TABLET | Freq: Three times a day (TID) | ORAL | 0 refills | Status: DC

## 2024-09-02 MED ORDER — ALPRAZOLAM 1 MG PO TABS: 1.0000 mg | ORAL_TABLET | Freq: Four times a day (QID) | ORAL | 2 refills | Status: DC

## 2024-09-02 MED ORDER — CARBAMAZEPINE 200 MG PO TABS: 200.0000 mg | ORAL_TABLET | Freq: Two times a day (BID) | ORAL | 2 refills | Status: DC

## 2024-09-02 NOTE — Progress Notes (Signed)
 Virtual Visit via Video Note  I connected with Destiny Orr on 09/02/24 at 11:00 AM EST by a video enabled telemedicine application and verified that I am speaking with the correct person using two identifiers.  Location: Patient: home Provider: office   I discussed the limitations of evaluation and management by telemedicine and the availability of in person appointments. The patient expressed understanding and agreed to proceed.      I discussed the assessment and treatment plan with the patient. The patient was provided an opportunity to ask questions and all were answered. The patient agreed with the plan and demonstrated an understanding of the instructions.   The patient was advised to call back or seek an in-person evaluation if the symptoms worsen or if the condition fails to improve as anticipated.  I provided 20 minutes of non-face-to-face time during this encounter.   Barnie Gull, MD  [facility] MD/PA/NP OP Progress Note  09/02/2024 11:20 AM Destiny Orr  MRN:  [id number]  Chief Complaint:  Chief Complaint  Patient presents with   Anxiety   Depression   Follow-up   HPI: This patient is a 43 year old separated white female lives with her ex-husband and 1 son and 1 daughter in [place].  She is on disability.  The patient returns for follow-up after 2 months regarding her major depression, generalized anxiety disorder and ADD inattentive type.  She states that she clear the Adderall with her cardiologist and she is back taking it.  She states that she has been somewhat depressed but primarily just sad and grieving over her father who died this past year.  Apparently November 25 was his birthday and Thanksgiving was very sad for her.  She is not looking forward to Christmas either.  She asked if we can switch antidepressants to a SNRI as she heard these were better for people with ADHD.  I think this is reasonable to try although I do not know if it will make all that much  difference.  I also urged her to get back into therapy with Jerel Pepper in our office.  She is sleeping well and she denies any thoughts of self-harm.  The Xanax  continues to help her anxiety Visit Diagnosis:    ICD-10-CM   1. Major depressive disorder, recurrent, severe without psychotic features (HCC)  F33.2     2. GAD (generalized anxiety disorder)  F41.1     3. Attention deficit hyperactivity disorder (ADHD), predominantly hyperactive type  F90.1       Past Psychiatric History: Long-term outpatient treatment for depression and anxiety  Past Medical History:  Past Medical History:  Diagnosis Date   Abnormal Papanicolaou smear of cervix with positive human papilloma virus (HPV) test 10/11/2018   LSIL +HPV, get colpo___   Abnormal uterine bleeding (AUB) 12/09/2014   Anxiety    Arthritis    Depression    DJD (degenerative joint disease)    Dumping syndrome    Dysmenorrhea 07/15/2014   Fatigue 12/24/2012   Fibromyalgia diagnosed April 2016   Headache    Hx of migraine headaches 12/24/2012   Inappropriate sinus tachycardia    Irregular menstrual bleeding 07/15/2014   Pelvic pain in female 03/03/2016   Pneumothorax, spontaneous, tension    Post concussion syndrome    POTS (postural orthostatic tachycardia syndrome)    PTSD (post-traumatic stress disorder)    Scoliosis    Thyroid  disease    Tick bite 03/03/2016   Vitamin B 12 deficiency     Past Surgical  History:  Procedure Laterality Date   CESAREAN SECTION     COLONOSCOPY     endooscopy     KNEE SURGERY Left    ROTATOR CUFF REPAIR Left     Family Psychiatric History: See below  Family History:  Family History  Problem Relation Age of Onset   Hypertension Father    Atrial fibrillation Father    Alcohol abuse Father    Heart disease Father    Other Father        Intraductal Papillary Mucinous Neoplasm (IPMN)   High Cholesterol Father    Anxiety disorder Maternal Aunt    Anxiety disorder Maternal Uncle    Bipolar  disorder Maternal Uncle    Breast cancer Maternal Uncle        CML   Cancer Maternal Grandmother        skin    Heart disease Maternal Grandmother    Other Maternal Grandmother        had thyroid  removed   Breast cancer Maternal Grandmother    Cancer Maternal Grandfather        bladder,lung   Heart disease Paternal Grandfather    COPD Paternal Grandfather    Hypertension Paternal Grandfather    Diabetes Paternal Grandfather    Stroke Paternal Actor    Healthy Daughter    Healthy Daughter    Healthy Son    Leukemia Other    Colon cancer Other        lung-2 maternal great uncles    Social History:  Social History   Socioeconomic History   Marital status: Married    Spouse name: Not on file   Number of children: 3   Years of education: Not on file   Highest education level: Not on file  Occupational History   Not on file  Tobacco Use   Smoking status: Former    Current packs/day: 0.00    Average packs/day: 1 pack/day for 24.5 years (24.5 ttl pk-yrs)    Types: Cigarettes    Start date: 03/13/1997    Quit date: 09/25/2021    Years since quitting: 2.9    Passive exposure: Past   Smokeless tobacco: Never   Tobacco comments:    working on it Was smoking 2.5 packs a day  Vaping Use   Vaping status: Former  Substance and Sexual Activity   Alcohol use: No   Drug use: No   Sexual activity: Yes    Partners: Male    Birth control/protection: None, Condom  Other Topics Concern   Not on file  Social History Narrative   Not on file   Social Drivers of Health   Financial Resource Strain: High Risk (07/03/2022)   Overall Financial Resource Strain (CARDIA)    Difficulty of Paying Living Expenses: Very hard  Food Insecurity: Low Risk  (05/01/2024)   Received from [facility]   Hunger Vital Sign    Within the past 12 months, you worried that your food would run out before you got money to buy more: Never true    Within the past 12 months, the food you bought just  didn't last and you didn't have money to get more. : Never true  Recent Concern: Food Insecurity - Medium Risk (04/06/2024)   Received from [facility]   Hunger Vital Sign    Within the past 12 months, you worried that your food would run out before you got money to buy more: Sometimes true    Within the  past 12 months, the food you bought just didn't last and you didn't have money to get more. : Sometimes true  Transportation Needs: No Transportation Needs (05/01/2024)   Received from [facility]    In the past 12 months, has lack of reliable transportation kept you from medical appointments, meetings, work or from getting things needed for daily living? : No  Physical Activity: Inactive (01/07/2021)   Exercise Vital Sign    Days of Exercise per Week: 0 days    Minutes of Exercise per Session: 0 min  Stress: Stress Concern Present (07/03/2022)   [employer] of Occupational Health - Occupational Stress Questionnaire    Feeling of Stress : Very much  Social Connections: Moderately Integrated (07/03/2022)   Social Connection and Isolation Panel    Frequency of Communication with Friends and Family: More than three times a week    Frequency of Social Gatherings with Friends and Family: Once a week    Attends Religious Services: 1 to 4 times per year    Active Member of [employer] or Organizations: No    Attends Banker Meetings: Never    Marital Status: Married    Allergies:  Allergies  Allergen Reactions   Prozac  [Fluoxetine ]     Suicidal thoughts   Topiramate Other (See Comments)    Topamax-dizziness   Lexapro  [Escitalopram  Oxalate] Other (See Comments)    Flat affect, No emotions    Metabolic Disorder Labs: Lab Results  Component Value Date   HGBA1C 5.5 10/01/2023   MPG 108 01/07/2013   Lab Results  Component Value Date   PROLACTIN 13.2 06/12/2016   Lab Results  Component Value Date   CHOL 204 (H) 01/07/2021   TRIG 132 01/07/2021   HDL  72 01/07/2021   CHOLHDL 2.8 01/07/2021   VLDL 31 01/07/2013   LDLCALC 109 (H) 01/07/2021   LDLCALC 80 10/08/2018   Lab Results  Component Value Date   TSH 0.65 10/01/2023   TSH 4.080 01/07/2021    Therapeutic Level Labs: Lab Results  Component Value Date   LITHIUM  0.45 (L) 01/27/2020   LITHIUM  0.24 (L) 09/03/2019   No results found for: VALPROATE No results found for: CBMZ  Current Medications: Current Outpatient Medications  Medication Sig Dispense Refill   desvenlafaxine  (PRISTIQ ) 50 MG 24 hr tablet Take 1 tablet (50 mg total) by mouth daily. 30 tablet 3   Acetaminophen  (TYLENOL  PO) Take by mouth as needed.     ALPRAZolam  (XANAX ) 1 MG tablet Take 1 tablet (1 mg total) by mouth 4 (four) times daily. 120 tablet 2   amoxicillin -clavulanate (AUGMENTIN ) 875-125 MG tablet Take 1 tablet by mouth every 12 (twelve) hours. 14 tablet 0   amphetamine -dextroamphetamine  (ADDERALL) 20 MG tablet Take 1 tablet (20 mg total) by mouth 3 (three) times daily. 90 tablet 0   amphetamine -dextroamphetamine  (ADDERALL) 20 MG tablet Take 1 tablet (20 mg total) by mouth 3 (three) times daily. 90 tablet 0   carbamazepine  (TEGRETOL ) 200 MG tablet Take 1 tablet (200 mg total) by mouth 2 (two) times daily. 60 tablet 2   cetirizine  (ZYRTEC  ALLERGY) 10 MG tablet Take 1 tablet (10 mg total) by mouth daily. 30 tablet 0   fluticasone  (FLONASE ) 50 MCG/ACT nasal spray Place 2 sprays into both nostrils daily. 9.9 mL 0   gabapentin  (NEURONTIN ) 300 MG capsule 1 PO q HS, may increase to 1 PO TID if tolerated 90 capsule 2   hyoscyamine  (LEVSIN ) 0.125 MG tablet  Take 1 tablet (0.125 mg total) by mouth every 6 (six) hours as needed for cramping. (Patient not taking: Reported on 02/22/2024) 90 tablet 3   ibuprofen  (ADVIL ,MOTRIN ) 200 MG tablet Take 400-600 mg by mouth every 6 (six) hours as needed for mild pain or moderate pain.     traZODone  (DESYREL ) 50 MG tablet Take 1 tablet (50 mg total) by mouth at bedtime. 30 tablet 2    No current facility-administered medications for this visit.     Musculoskeletal: Strength & Muscle Tone: within normal limits Gait & Station: normal Patient leans: N/A  Psychiatric Specialty Exam: Review of Systems  Constitutional:  Positive for fatigue.  Psychiatric/Behavioral:  Positive for dysphoric mood.   All other systems reviewed and are negative.   There were no vitals taken for this visit.There is no height or weight on file to calculate BMI.  General Appearance: Casual and Fairly Groomed  Eye Contact:  Good  Speech:  Clear and Coherent  Volume:  Normal  Mood:  Depressed  Affect:  Tearful  Thought Process:  Goal Directed  Orientation:  Full (Time, Place, and Person)  Thought Content: Rumination   Suicidal Thoughts:  No  Homicidal Thoughts:  No  Memory:  Immediate;   Good Recent;   Good Remote;   Good  Judgement:  Good  Insight:  Fair  Psychomotor Activity:  Decreased  Concentration:  Concentration: Good and Attention Span: Good  Recall:  Good  Fund of Knowledge: Good  Language: Good  Akathisia:  No  Handed:  Right  AIMS (if indicated): not done  Assets:  Communication Skills Desire for Improvement Resilience Social Support  ADL's:  Intact  Cognition: WNL  Sleep:  Good   Screenings: GAD-7    Advertising Copywriter from 12/07/2023 in [facility] [place] Office Visit from 07/03/2022 in [facility] for [facility] at [facility] Counselor from 05/31/2021 in [facility] at [place] Office Visit from 01/07/2021 in [facility] at [facility]  Total GAD-7 Score 21 21 20 16    Mini-Mental    Flowsheet Row Office Visit from 10/25/2016 in [facility]  Total Score (max 30 points ) 29   PHQ2-9    Flowsheet Row Counselor from 12/07/2023 in [facility] at [place] Office Visit from 07/03/2022 in [facility] for [facility] Healthcare at [facility] Video Visit from 06/29/2022 in [facility] at [place] Video Visit from 04/11/2022 in [facility] at [place] Video Visit from 03/03/2022 in [facility] at [facility] Total Score 3 6 1 2 5   PHQ-9 Total Score 14 21 -- 6 13   Flowsheet Row ED from 08/14/2024 in [facility] Department at [facility] UC from 03/07/2024 in [facility] at [facility] ED from 09/21/2023 in [facility] Department at [facility]  C-SSRS RISK CATEGORY No Risk No Risk No Risk     Assessment and Plan: This patient is a 43 year old female with a history of major depression recurrent, generalized anxiety disorder chronic fatigue ADD and borderline personality disorder.  She is more depressed regarding her father's recent death and the fact that he is not present at family holidays.  We will switch Celexa  to Pristiq  50 mg daily for major depression.  She will continue Xanax  1 mg 4 times daily for anxiety, Tegretol  200 mg twice  daily for mood stabilization, gabapentin  300 mg 3 times a day as needed for anxiety and trazodone  50 mg at bedtime for sleep.  She will return to see me in 4 weeks  Collaboration of Care: Collaboration of Care: Referral or follow-up with counselor/therapist AEB patient will continue therapy with Jerel Pepper in our office  Patient/Guardian was advised Release of Information must be obtained prior to any record release in order to collaborate their care with an outside provider. Patient/Guardian was advised if they have not already done so to contact the registration department to sign all necessary forms in order for us  to release information regarding their care.   Consent: Patient/Guardian gives verbal consent for treatment and assignment of benefits for services provided during this visit. Patient/Guardian expressed  understanding and agreed to proceed.    Barnie Gull, MD 09/02/2024, 11:20 AM

## 2024-10-03 ENCOUNTER — Other Ambulatory Visit (HOSPITAL_COMMUNITY): Payer: Self-pay | Admitting: Psychiatry

## 2024-10-13 ENCOUNTER — Encounter (HOSPITAL_COMMUNITY): Payer: Self-pay

## 2024-10-13 NOTE — Telephone Encounter (Signed)
 Yes that's fine, they may need to send in a refill request

## 2024-10-14 ENCOUNTER — Telehealth: Admitting: Family Medicine

## 2024-10-14 DIAGNOSIS — R051 Acute cough: Secondary | ICD-10-CM | POA: Diagnosis not present

## 2024-10-14 DIAGNOSIS — Z20828 Contact with and (suspected) exposure to other viral communicable diseases: Secondary | ICD-10-CM | POA: Diagnosis not present

## 2024-10-14 DIAGNOSIS — R6889 Other general symptoms and signs: Secondary | ICD-10-CM | POA: Diagnosis not present

## 2024-10-14 MED ORDER — PROMETHAZINE-DM 6.25-15 MG/5ML PO SYRP: 5.0000 mL | ORAL_SOLUTION | Freq: Four times a day (QID) | ORAL | 0 refills | Status: AC | PRN

## 2024-10-14 MED ORDER — ALBUTEROL SULFATE HFA 108 (90 BASE) MCG/ACT IN AERS: 1.0000 | INHALATION_SPRAY | Freq: Four times a day (QID) | RESPIRATORY_TRACT | 0 refills | Status: AC | PRN

## 2024-10-14 MED ORDER — BENZONATATE 100 MG PO CAPS: 100.0000 mg | ORAL_CAPSULE | Freq: Three times a day (TID) | ORAL | 0 refills | Status: AC | PRN

## 2024-10-14 MED ORDER — OSELTAMIVIR PHOSPHATE 75 MG PO CAPS: 75.0000 mg | ORAL_CAPSULE | Freq: Two times a day (BID) | ORAL | 0 refills | Status: AC

## 2024-10-14 NOTE — Progress Notes (Signed)
 E visit for Flu like symptoms   We are sorry that you are not feeling well.  Here is how we plan to help! Based on what you have shared with me it looks like you may have a respiratory virus that may be influenza.  Influenza or the flu is  an infection caused by a respiratory virus. The flu virus is highly contagious and persons who did not receive their yearly flu vaccination may catch the flu from close contact.  We have anti-viral medications to treat the viruses that cause this infection. They are not a cure and only shorten the course of the infection. These prescriptions are most effective when they are given within the first 2 days of flu symptoms. Antiviral medications are indicated if you have a high risk of complications from the flu. You should  also consider an antiviral medication if you are in close contact with someone who is at risk. These medications can help patients avoid complications from the flu but have side effects that you should know.   Possible side effects from Tamiflu  or oseltamivir  include nausea, vomiting, diarrhea, dizziness, headaches, eye redness, sleep problems or other respiratory symptoms. You should not take Tamiflu  if you have an allergy to oseltamivir  or any to the ingredients in Tamiflu .  Based upon your symptoms and potential risk factors I have prescribed Oseltamivir  (Tamiflu ).  It has been sent to your designated pharmacy.  You will take one 75 mg capsule orally twice a day for the next 5 days.   For nasal congestion, you may use an oral decongestant such as Mucinex D or if you have glaucoma or high blood pressure use plain Mucinex.  Saline nasal spray or nasal drops can help and can safely be used as often as needed for congestion.  If you have a sore or scratchy throat, use a saltwater gargle-  to  teaspoon of salt dissolved in a 4-ounce to 8-ounce glass of warm water.  Gargle the solution for approximately 15-30 seconds and then spit.  It is  important not to swallow the solution.  You can also use throat lozenges/cough drops and Chloraseptic spray to help with throat pain or discomfort.  Warm or cold liquids can also be helpful in relieving throat pain.  For headache, pain or general discomfort, you can use Ibuprofen or Tylenol as directed.   Some authorities believe that zinc sprays or the use of Echinacea may shorten the course of your symptoms.  I have prescribed the following medications to help lessen symptoms: I have prescribed Tessalon  Perles 100 mg. You may take 1-2 capsules every 8 hours as needed for cough, I have prescribed an inhaler - Albuterol  MDI 90 mcg /actuation 2 puffs every 4 hours as needed for shortness of breath, wheezing, cough, and I have prescribed Phenergan  DM 6.25 mg/15 mg. You make take one teaspoon / 5 ml every 4-6 hours as needed for cough  You are to isolate at home until you have been fever-free for at least 24 hours without a fever-reducing medication, and symptoms have been steadily improving for 24 hours.  If you must be around other household members who do not have symptoms, you need to make sure that both you and the family members are masking consistently with a high-quality mask.  If you note any worsening of symptoms despite treatment, please seek an in-person evaluation ASAP. If you note any significant shortness of breath or any chest pain, please seek ED evaluation. Please do not  delay care!  ANYONE WHO HAS FLU SYMPTOMS SHOULD: Stay home. The flu is highly contagious and going out or to work exposes others! Be sure to drink plenty of fluids. Water is fine as well as fruit juices, sodas and electrolyte beverages. You may want to stay away from caffeine or alcohol. If you are nauseated, try taking small sips of liquids. How do you know if you are getting enough fluid? Your urine should be a pale yellow or almost colorless. Get rest. Taking a steamy shower or using a humidifier may help nasal  congestion and ease sore throat pain. Using a saline nasal spray works much the same way. Cough drops, hard candies and sore throat lozenges may ease your cough. Line up a caregiver. Have someone check on you regularly.  GET HELP RIGHT AWAY IF: You cannot keep down liquids or your medications. You become short of breath Your fell like you are going to pass out or loose consciousness. Your symptoms persist after you have completed your treatment plan  MAKE SURE YOU  Understand these instructions. Will watch your condition. Will get help right away if you are not doing well or get worse.  Your e-visit answers were reviewed by a board certified advanced clinical practitioner to complete your personal care plan.  Depending on the condition, your plan could have included both over the counter or prescription medications.  If there is a problem please reply  once you have received a response from your provider.  Your safety is important to us .  If you have drug allergies check your prescription carefully.    You can use MyChart to ask questions about todays visit, request a non-urgent call back, or ask for a work or school excuse for 24 hours related to this e-Visit. If it has been greater than 24 hours you will need to follow up with your provider, or enter a new e-Visit to address those concerns.  You will get an e-mail in the next two days asking about your experience.  I hope that your e-visit has been valuable and will speed your recovery. Thank you for using e-visits.   I have spent 5 minutes in review of e-visit questionnaire, review and updating patient chart, medical decision making and response to patient.   Chiquita CHRISTELLA Barefoot, NP

## 2024-10-17 ENCOUNTER — Other Ambulatory Visit (HOSPITAL_COMMUNITY): Payer: Self-pay | Admitting: Psychiatry

## 2024-10-21 ENCOUNTER — Telehealth (HOSPITAL_COMMUNITY): Admitting: Psychiatry

## 2024-10-21 ENCOUNTER — Encounter (HOSPITAL_COMMUNITY): Payer: Self-pay | Admitting: Psychiatry

## 2024-10-21 DIAGNOSIS — F332 Major depressive disorder, recurrent severe without psychotic features: Secondary | ICD-10-CM

## 2024-10-21 DIAGNOSIS — F901 Attention-deficit hyperactivity disorder, predominantly hyperactive type: Secondary | ICD-10-CM

## 2024-10-21 DIAGNOSIS — F411 Generalized anxiety disorder: Secondary | ICD-10-CM

## 2024-10-21 MED ORDER — DESVENLAFAXINE SUCCINATE ER 100 MG PO TB24: 100.0000 mg | ORAL_TABLET | Freq: Every day | ORAL | 3 refills | Status: AC

## 2024-10-21 MED ORDER — TRAZODONE HCL 50 MG PO TABS: 50.0000 mg | ORAL_TABLET | Freq: Every day | ORAL | 2 refills | Status: AC

## 2024-10-21 MED ORDER — CARBAMAZEPINE 200 MG PO TABS: 200.0000 mg | ORAL_TABLET | Freq: Two times a day (BID) | ORAL | 2 refills | Status: AC

## 2024-10-21 MED ORDER — DIAZEPAM 10 MG PO TABS: 20.0000 mg | ORAL_TABLET | Freq: Three times a day (TID) | ORAL | 2 refills | Status: DC

## 2024-10-21 MED ORDER — AMPHETAMINE-DEXTROAMPHETAMINE 20 MG PO TABS: 20.0000 mg | ORAL_TABLET | Freq: Three times a day (TID) | ORAL | 0 refills | Status: AC

## 2024-10-21 NOTE — Progress Notes (Signed)
 Virtual Visit via Video Note  I connected with Destiny Orr on 10/21/24 at  2:00 PM EST by a video enabled telemedicine application and verified that I am speaking with the correct person using two identifiers.  Location: Patient: home Provider: office   I discussed the limitations of evaluation and management by telemedicine and the availability of in person appointments. The patient expressed understanding and agreed to proceed.     I discussed the assessment and treatment plan with the patient. The patient was provided an opportunity to ask questions and all were answered. The patient agreed with the plan and demonstrated an understanding of the instructions.   The patient was advised to call back or seek an in-person evaluation if the symptoms worsen or if the condition fails to improve as anticipated.  I provided 20 minutes of non-face-to-face time during this encounter.   Barnie Gull, MD  [facility] MD/PA/NP OP Progress Note  10/21/2024 2:32 PM Destiny Orr  MRN:  [id number]  Chief Complaint:  Chief Complaint  Patient presents with   Depression   Anxiety   ADD   Follow-up   HPI: This patient is a 44 year old separated white female lives with her ex-husband and 1 son and 1 daughter in [place]. She is on disability.   The patient returns for follow-up after 6 weeks regarding her major depression generalized anxiety and ADD.  She also has borderline personality traits.  The patient states that she has been somewhat more depressed.  As usual she is frustrated with her family.  She constantly complains that she is the only one doing any cooking or cleaning.  She claims she has asked the others for help but they refused.  I suggested that maybe she should just stop doing these things and then they would have to pick up the slack.  She always finds a reason not to agree with this.  Last time we started Pristiq  at 50 mg and she thinks it is helping a little bit but would like to go higher.   She is still focusing well with the Adderall.  She has switched back from Xanax  to diazepam  which helps more with the anxiety over the long run but does not work as quickly.  She still would rather stick with it. Visit Diagnosis:    ICD-10-CM   1. Major depressive disorder, recurrent, severe without psychotic features (HCC)  F33.2     2. GAD (generalized anxiety disorder)  F41.1     3. Attention deficit hyperactivity disorder (ADHD), predominantly hyperactive type  F90.1       Past Psychiatric History: Long-term outpatient treatment for depression and anxiety  Past Medical History:  Past Medical History:  Diagnosis Date   Abnormal Papanicolaou smear of cervix with positive human papilloma virus (HPV) test 10/11/2018   LSIL +HPV, get colpo___   Abnormal uterine bleeding (AUB) 12/09/2014   Anxiety    Arthritis    Depression    DJD (degenerative joint disease)    Dumping syndrome    Dysmenorrhea 07/15/2014   Fatigue 12/24/2012   Fibromyalgia diagnosed April 2016   Headache    Hx of migraine headaches 12/24/2012   Inappropriate sinus tachycardia    Irregular menstrual bleeding 07/15/2014   Pelvic pain in female 03/03/2016   Pneumothorax, spontaneous, tension    Post concussion syndrome    POTS (postural orthostatic tachycardia syndrome)    PTSD (post-traumatic stress disorder)    Scoliosis    Thyroid  disease    Tick bite  03/03/2016   Vitamin B 12 deficiency     Past Surgical History:  Procedure Laterality Date   CESAREAN SECTION     COLONOSCOPY     endooscopy     KNEE SURGERY Left    ROTATOR CUFF REPAIR Left     Family Psychiatric History: See below  Family History:  Family History  Problem Relation Age of Onset   Hypertension Father    Atrial fibrillation Father    Alcohol abuse Father    Heart disease Father    Other Father        Intraductal Papillary Mucinous Neoplasm (IPMN)   High Cholesterol Father    Anxiety disorder Maternal Aunt    Anxiety disorder Maternal  Uncle    Bipolar disorder Maternal Uncle    Breast cancer Maternal Uncle        CML   Cancer Maternal Grandmother        skin    Heart disease Maternal Grandmother    Other Maternal Grandmother        had thyroid  removed   Breast cancer Maternal Grandmother    Cancer Maternal Grandfather        bladder,lung   Heart disease Paternal Grandfather    COPD Paternal Grandfather    Hypertension Paternal Grandfather    Diabetes Paternal Grandfather    Stroke Paternal Actor    Healthy Daughter    Healthy Daughter    Healthy Son    Leukemia Other    Colon cancer Other        lung-2 maternal great uncles    Social History:  Social History   Socioeconomic History   Marital status: Married    Spouse name: Not on file   Number of children: 3   Years of education: Not on file   Highest education level: Not on file  Occupational History   Not on file  Tobacco Use   Smoking status: Former    Current packs/day: 0.00    Average packs/day: 1 pack/day for 24.5 years (24.5 ttl pk-yrs)    Types: Cigarettes    Start date: 03/13/1997    Quit date: 09/25/2021    Years since quitting: 3.0    Passive exposure: Past   Smokeless tobacco: Never   Tobacco comments:    working on it Was smoking 2.5 packs a day  Vaping Use   Vaping status: Former  Substance and Sexual Activity   Alcohol use: No   Drug use: No   Sexual activity: Yes    Partners: Male    Birth control/protection: None, Condom  Other Topics Concern   Not on file  Social History Narrative   Not on file   Social Drivers of Health   Tobacco Use: Medium Risk (10/21/2024)   Patient History    Smoking Tobacco Use: Former    Smokeless Tobacco Use: Never    Passive Exposure: Past  Physicist, Medical Strain: High Risk (07/03/2022)   Overall Financial Resource Strain (CARDIA)    Difficulty of Paying Living Expenses: Very hard  Food Insecurity: Low Risk (05/01/2024)   Received from [facility]   Epic    Within the  past 12 months, you worried that your food would run out before you got money to buy more: Never true    Within the past 12 months, the food you bought just didn't last and you didn't have money to get more. : Never true  Recent Concern: Food Insecurity - Medium Risk (04/06/2024)  Received from [facility]   Epic    Within the past 12 months, you worried that your food would run out before you got money to buy more: Sometimes true    Within the past 12 months, the food you bought just didn't last and you didn't have money to get more. : Sometimes true  Transportation Needs: No Transportation Needs (05/01/2024)   Received from [facility]    In the past 12 months, has lack of reliable transportation kept you from medical appointments, meetings, work or from getting things needed for daily living? : No  Physical Activity: Not on file  Stress: Stress Concern Present (07/03/2022)   [employer] of Occupational Health - Occupational Stress Questionnaire    Feeling of Stress : Very much  Social Connections: Moderately Integrated (07/03/2022)   Social Connection and Isolation Panel    Frequency of Communication with Friends and Family: More than three times a week    Frequency of Social Gatherings with Friends and Family: Once a week    Attends Religious Services: 1 to 4 times per year    Active Member of Clubs or Organizations: No    Attends Banker Meetings: Never    Marital Status: Married  Depression (PHQ2-9): High Risk (12/07/2023)   Depression (PHQ2-9)    PHQ-2 Score: 14  Alcohol Screen: Low Risk (07/03/2022)   Alcohol Screen    Last Alcohol Screening Score (AUDIT): 0  Housing: Low Risk (05/01/2024)   Received from [facility]   Epic    What is your living situation today?: I have a steady place to live    Think about the place you live. Do you have problems with any of the following? Choose all that apply:: None/None on this list  Utilities: Low Risk  (05/01/2024)   Received from [facility]   Utilities    In the past 12 months has the electric, gas, oil, or water company threatened to shut off services in your home? : No  Recent Concern: Utilities - Medium Risk (04/06/2024)   Received from [facility]   Utilities    In the past 12 months has the electric, gas, oil, or water company threatened to shut off services in your home? : Yes  Health Literacy: Not on file    Allergies: Allergies[1]  Metabolic Disorder Labs: Lab Results  Component Value Date   HGBA1C 5.5 10/01/2023   MPG 108 01/07/2013   Lab Results  Component Value Date   PROLACTIN 13.2 06/12/2016   Lab Results  Component Value Date   CHOL 204 (H) 01/07/2021   TRIG 132 01/07/2021   HDL 72 01/07/2021   CHOLHDL 2.8 01/07/2021   VLDL 31 01/07/2013   LDLCALC 109 (H) 01/07/2021   LDLCALC 80 10/08/2018   Lab Results  Component Value Date   TSH 0.65 10/01/2023   TSH 4.080 01/07/2021    Therapeutic Level Labs: Lab Results  Component Value Date   LITHIUM  0.45 (L) 01/27/2020   LITHIUM  0.24 (L) 09/03/2019   No results found for: VALPROATE No results found for: CBMZ  Current Medications: Current Outpatient Medications  Medication Sig Dispense Refill   desvenlafaxine  (PRISTIQ ) 100 MG 24 hr tablet Take 1 tablet (100 mg total) by mouth daily. 30 tablet 3   diazepam  (VALIUM ) 10 MG tablet Take 2 tablets (20 mg total) by mouth 3 (three) times daily. 180 tablet 2   Acetaminophen  (TYLENOL  PO) Take by mouth as needed.  albuterol  (VENTOLIN  HFA) 108 (90 Base) MCG/ACT inhaler Inhale 1-2 puffs into the lungs every 6 (six) hours as needed for wheezing or shortness of breath (cough). 8 g 0   amoxicillin -clavulanate (AUGMENTIN ) 875-125 MG tablet Take 1 tablet by mouth every 12 (twelve) hours. 14 tablet 0   amphetamine -dextroamphetamine  (ADDERALL) 20 MG tablet Take 1 tablet (20 mg total) by mouth 3 (three) times daily. 90 tablet 0   amphetamine -dextroamphetamine   (ADDERALL) 20 MG tablet Take 1 tablet (20 mg total) by mouth 3 (three) times daily. 90 tablet 0   benzonatate  (TESSALON ) 100 MG capsule Take 1 capsule (100 mg total) by mouth 3 (three) times daily as needed for cough. 30 capsule 0   carbamazepine  (TEGRETOL ) 200 MG tablet Take 1 tablet (200 mg total) by mouth 2 (two) times daily. 60 tablet 2   cetirizine  (ZYRTEC  ALLERGY) 10 MG tablet Take 1 tablet (10 mg total) by mouth daily. 30 tablet 0   fluticasone  (FLONASE ) 50 MCG/ACT nasal spray Place 2 sprays into both nostrils daily. 9.9 mL 0   gabapentin  (NEURONTIN ) 300 MG capsule 1 PO q HS, may increase to 1 PO TID if tolerated 90 capsule 2   hyoscyamine  (LEVSIN ) 0.125 MG tablet Take 1 tablet (0.125 mg total) by mouth every 6 (six) hours as needed for cramping. (Patient not taking: Reported on 02/22/2024) 90 tablet 3   ibuprofen  (ADVIL ,MOTRIN ) 200 MG tablet Take 400-600 mg by mouth every 6 (six) hours as needed for mild pain or moderate pain.     promethazine -dextromethorphan (PROMETHAZINE -DM) 6.25-15 MG/5ML syrup Take 5 mLs by mouth 4 (four) times daily as needed for cough. 118 mL 0   traZODone  (DESYREL ) 50 MG tablet Take 1 tablet (50 mg total) by mouth at bedtime. 30 tablet 2   No current facility-administered medications for this visit.     Musculoskeletal: Strength & Muscle Tone: within normal limits Gait & Station: normal Patient leans: N/A  Psychiatric Specialty Exam: Review of Systems  Psychiatric/Behavioral:  Positive for dysphoric mood. The patient is nervous/anxious.   All other systems reviewed and are negative.   There were no vitals taken for this visit.There is no height or weight on file to calculate BMI.  General Appearance: Casual and Fairly Groomed  Eye Contact:  Good  Speech:  Clear and Coherent  Volume:  Normal  Mood:  Dysphoric  Affect:  Flat  Thought Process:  Goal Directed  Orientation:  Full (Time, Place, and Person)  Thought Content: Rumination   Suicidal Thoughts:   No  Homicidal Thoughts:  No  Memory:  Immediate;   Good Recent;   Good Remote;   Good  Judgement:  Good  Insight:  Fair  Psychomotor Activity:  Decreased  Concentration:  Concentration: Good and Attention Span: Good  Recall:  Good  Fund of Knowledge: Good  Language: Good  Akathisia:  No  Handed:  Right  AIMS (if indicated): not done  Assets:  Communication Skills Physical Health Resilience Social Support  ADL's:  Intact  Cognition: WNL  Sleep:  Good   Screenings: GAD-7    Advertising Copywriter from 12/07/2023 in [facility] [place] Office Visit from 07/03/2022 in [facility] Counselor from 05/31/2021 in [facility] [place] Office Visit from 01/07/2021 in [facility] at [facility]  Total GAD-7 Score 21 21 20 16    Mini-Mental    Flowsheet Row Office Visit from  10/25/2016 in [facility]  Total Score (max 30 points ) 29   PHQ2-9    Flowsheet Row Counselor from 12/07/2023 in [facility] [place] Office Visit from 07/03/2022 in [facility] at [facility] Video Visit from 06/29/2022 in [facility] at [place] Video Visit from 04/11/2022 in [facility] at [place] Video Visit from 03/03/2022 in [facility] at [facility] Total Score 3 6 1 2 5   PHQ-9 Total Score 14 21 -- 6 13   Flowsheet Row ED from 08/14/2024 in [facility] at [facility] UC from 03/07/2024 in [facility] at [facility] ED from 09/21/2023 in [facility] Department at [facility]  C-SSRS RISK CATEGORY No Risk No Risk No Risk     Assessment and Plan: This patient is a 44 year old female with a history of major depression generalized anxiety disorder  chronic fatigue ADD and borderline personality traits.  She still tends to be dysphoric and negative no matter what she takes but she does think Pristiq  might be a little helpful so we will increase it from 50 to 100 mg daily for major depression.  She will continue Valium  20 mg 3 times daily for anxiety, Tegretol  200 mg twice daily for mood stabilization, gabapentin  300 mg 3 times daily as needed for anxiety and trazodone  50 mg at bedtime for sleep.  She will return to see me in 4 weeks  Collaboration of Care: Collaboration of Care: Primary Care Provider AEB notes to be shared with PCP at patient's request  Patient/Guardian was advised Release of Information must be obtained prior to any record release in order to collaborate their care with an outside provider. Patient/Guardian was advised if they have not already done so to contact the registration department to sign all necessary forms in order for us  to release information regarding their care.   Consent: Patient/Guardian gives verbal consent for treatment and assignment of benefits for services provided during this visit. Patient/Guardian expressed understanding and agreed to proceed.    Barnie Gull, MD 10/21/2024, 2:32 PM     [1]  Allergies Allergen Reactions   Prozac  [Fluoxetine ]     Suicidal thoughts   Topiramate Other (See Comments)    Topamax-dizziness   Lexapro  [Escitalopram  Oxalate] Other (See Comments)    Flat affect, No emotions

## 2024-10-28 ENCOUNTER — Other Ambulatory Visit (HOSPITAL_COMMUNITY): Payer: Self-pay | Admitting: Psychiatry

## 2024-10-28 ENCOUNTER — Encounter (HOSPITAL_COMMUNITY): Payer: Self-pay

## 2024-10-28 MED ORDER — ALPRAZOLAM 1 MG PO TABS: 1.0000 mg | ORAL_TABLET | Freq: Four times a day (QID) | ORAL | 2 refills | Status: AC

## 2024-10-28 NOTE — Telephone Encounter (Signed)
Informed patient with what provider stated and she verbalized understanding.  

## 2024-10-28 NOTE — Telephone Encounter (Signed)
 Okay, I have sent in the xanax 

## 2024-10-29 ENCOUNTER — Ambulatory Visit (HOSPITAL_COMMUNITY): Admitting: Clinical

## 2024-11-18 ENCOUNTER — Telehealth (HOSPITAL_COMMUNITY): Admitting: Psychiatry
